# Patient Record
Sex: Male | Born: 1935
Health system: Southern US, Community
[De-identification: ages and names within clinical notes are randomized; demographics above are authoritative.]

## PROBLEM LIST (undated history)

## (undated) DIAGNOSIS — R609 Edema, unspecified: Secondary | ICD-10-CM

## (undated) DIAGNOSIS — R51 Headache: Secondary | ICD-10-CM

## (undated) DIAGNOSIS — IMO0002 Reserved for concepts with insufficient information to code with codable children: Secondary | ICD-10-CM

## (undated) DIAGNOSIS — J449 Chronic obstructive pulmonary disease, unspecified: Secondary | ICD-10-CM

## (undated) DIAGNOSIS — M810 Age-related osteoporosis without current pathological fracture: Secondary | ICD-10-CM

## (undated) DIAGNOSIS — N400 Enlarged prostate without lower urinary tract symptoms: Secondary | ICD-10-CM

## (undated) DIAGNOSIS — R600 Localized edema: Secondary | ICD-10-CM

## (undated) DIAGNOSIS — K220 Achalasia of cardia: Secondary | ICD-10-CM

## (undated) DIAGNOSIS — N189 Chronic kidney disease, unspecified: Secondary | ICD-10-CM

## (undated) DIAGNOSIS — K224 Dyskinesia of esophagus: Secondary | ICD-10-CM

## (undated) DIAGNOSIS — I10 Essential (primary) hypertension: Secondary | ICD-10-CM

## (undated) DIAGNOSIS — I4891 Unspecified atrial fibrillation: Secondary | ICD-10-CM

## (undated) DIAGNOSIS — Z8719 Personal history of other diseases of the digestive system: Secondary | ICD-10-CM

## (undated) DIAGNOSIS — F1010 Alcohol abuse, uncomplicated: Secondary | ICD-10-CM

## (undated) DIAGNOSIS — M1990 Unspecified osteoarthritis, unspecified site: Secondary | ICD-10-CM

## (undated) DIAGNOSIS — J45909 Unspecified asthma, uncomplicated: Secondary | ICD-10-CM

## (undated) DIAGNOSIS — K644 Residual hemorrhoidal skin tags: Secondary | ICD-10-CM

## (undated) DIAGNOSIS — K219 Gastro-esophageal reflux disease without esophagitis: Secondary | ICD-10-CM

## (undated) DIAGNOSIS — I878 Other specified disorders of veins: Secondary | ICD-10-CM

## (undated) DIAGNOSIS — E538 Deficiency of other specified B group vitamins: Secondary | ICD-10-CM

## (undated) DIAGNOSIS — IMO0001 Reserved for inherently not codable concepts without codable children: Secondary | ICD-10-CM

## (undated) DIAGNOSIS — D090 Carcinoma in situ of bladder: Secondary | ICD-10-CM

## (undated) HISTORY — DX: Essential (primary) hypertension: I10

## (undated) HISTORY — DX: Edema, unspecified: R60.9

## (undated) HISTORY — DX: Alcohol abuse, uncomplicated: F10.10

## (undated) HISTORY — DX: Carcinoma in situ of bladder: D09.0

## (undated) HISTORY — DX: Unspecified atrial fibrillation: I48.91

## (undated) HISTORY — DX: Unspecified osteoarthritis, unspecified site: M19.90

## (undated) HISTORY — DX: Deficiency of other specified B group vitamins: E53.8

## (undated) HISTORY — DX: Unspecified asthma, uncomplicated: J45.909

## (undated) HISTORY — DX: Reserved for concepts with insufficient information to code with codable children: IMO0002

## (undated) HISTORY — DX: Dyskinesia of esophagus: K22.4

## (undated) HISTORY — DX: Other specified disorders of veins: I87.8

## (undated) HISTORY — DX: Gastro-esophageal reflux disease without esophagitis: K21.9

## (undated) HISTORY — PX: LUNG BIOPSY: SHX232

## (undated) HISTORY — DX: Benign prostatic hyperplasia without lower urinary tract symptoms: N40.0

## (undated) HISTORY — PX: JOINT REPLACEMENT: SHX530

## (undated) HISTORY — DX: Localized edema: R60.0

## (undated) HISTORY — DX: Residual hemorrhoidal skin tags: K64.4

## (undated) HISTORY — DX: Age-related osteoporosis without current pathological fracture: M81.0

## (undated) HISTORY — PX: BLADDER ASPIRATION: SHX472

## (undated) HISTORY — DX: Reserved for inherently not codable concepts without codable children: IMO0001

## (undated) HISTORY — PX: HERNIA REPAIR: SHX51

## (undated) HISTORY — DX: Achalasia of cardia: K22.0

## (undated) HISTORY — DX: Chronic obstructive pulmonary disease, unspecified: J44.9

---

## 1972-06-27 HISTORY — PX: RHINOPLASTY: SUR1284

## 2000-06-27 DIAGNOSIS — K224 Dyskinesia of esophagus: Secondary | ICD-10-CM

## 2000-06-27 HISTORY — DX: Dyskinesia of esophagus: K22.4

## 2000-07-25 ENCOUNTER — Ambulatory Visit (HOSPITAL_COMMUNITY): Admission: RE | Admit: 2000-07-25 | Discharge: 2000-07-25 | Payer: Self-pay | Admitting: Gastroenterology

## 2000-07-25 ENCOUNTER — Encounter: Payer: Self-pay | Admitting: Gastroenterology

## 2000-08-23 ENCOUNTER — Ambulatory Visit (HOSPITAL_COMMUNITY): Admission: RE | Admit: 2000-08-23 | Discharge: 2000-08-23 | Payer: Self-pay | Admitting: Gastroenterology

## 2000-08-23 ENCOUNTER — Encounter: Payer: Self-pay | Admitting: Gastroenterology

## 2000-09-25 ENCOUNTER — Ambulatory Visit (HOSPITAL_COMMUNITY): Admission: RE | Admit: 2000-09-25 | Discharge: 2000-09-25 | Payer: Self-pay | Admitting: Gastroenterology

## 2002-11-11 ENCOUNTER — Ambulatory Visit (HOSPITAL_COMMUNITY): Admission: RE | Admit: 2002-11-11 | Discharge: 2002-11-11 | Payer: Self-pay | Admitting: Family Medicine

## 2002-11-11 ENCOUNTER — Encounter: Payer: Self-pay | Admitting: Family Medicine

## 2002-11-18 ENCOUNTER — Ambulatory Visit (HOSPITAL_COMMUNITY): Admission: RE | Admit: 2002-11-18 | Discharge: 2002-11-18 | Payer: Self-pay | Admitting: Family Medicine

## 2002-12-13 ENCOUNTER — Ambulatory Visit (HOSPITAL_COMMUNITY): Admission: RE | Admit: 2002-12-13 | Discharge: 2002-12-13 | Payer: Self-pay | Admitting: Family Medicine

## 2002-12-13 ENCOUNTER — Encounter: Payer: Self-pay | Admitting: Family Medicine

## 2002-12-26 ENCOUNTER — Encounter (HOSPITAL_COMMUNITY): Admission: RE | Admit: 2002-12-26 | Discharge: 2003-01-25 | Payer: Self-pay | Admitting: Cardiology

## 2003-01-01 ENCOUNTER — Encounter: Payer: Self-pay | Admitting: Thoracic Surgery (Cardiothoracic Vascular Surgery)

## 2003-01-01 ENCOUNTER — Ambulatory Visit (HOSPITAL_COMMUNITY)
Admission: RE | Admit: 2003-01-01 | Discharge: 2003-01-01 | Payer: Self-pay | Admitting: Thoracic Surgery (Cardiothoracic Vascular Surgery)

## 2003-01-07 ENCOUNTER — Inpatient Hospital Stay (HOSPITAL_COMMUNITY)
Admission: RE | Admit: 2003-01-07 | Discharge: 2003-01-12 | Payer: Self-pay | Admitting: Thoracic Surgery (Cardiothoracic Vascular Surgery)

## 2003-01-07 ENCOUNTER — Encounter: Payer: Self-pay | Admitting: Thoracic Surgery (Cardiothoracic Vascular Surgery)

## 2003-01-07 ENCOUNTER — Encounter (INDEPENDENT_AMBULATORY_CARE_PROVIDER_SITE_OTHER): Payer: Self-pay | Admitting: Specialist

## 2003-01-08 ENCOUNTER — Encounter: Payer: Self-pay | Admitting: Thoracic Surgery (Cardiothoracic Vascular Surgery)

## 2003-01-10 ENCOUNTER — Encounter: Payer: Self-pay | Admitting: Thoracic Surgery (Cardiothoracic Vascular Surgery)

## 2003-01-11 ENCOUNTER — Encounter: Payer: Self-pay | Admitting: Thoracic Surgery (Cardiothoracic Vascular Surgery)

## 2003-01-28 ENCOUNTER — Encounter
Admission: RE | Admit: 2003-01-28 | Discharge: 2003-01-28 | Payer: Self-pay | Admitting: Thoracic Surgery (Cardiothoracic Vascular Surgery)

## 2003-01-28 ENCOUNTER — Encounter: Payer: Self-pay | Admitting: Thoracic Surgery (Cardiothoracic Vascular Surgery)

## 2003-07-04 ENCOUNTER — Ambulatory Visit (HOSPITAL_COMMUNITY): Admission: RE | Admit: 2003-07-04 | Discharge: 2003-07-04 | Payer: Self-pay | Admitting: Family Medicine

## 2004-04-08 ENCOUNTER — Ambulatory Visit (HOSPITAL_COMMUNITY): Admission: RE | Admit: 2004-04-08 | Discharge: 2004-04-08 | Payer: Self-pay | Admitting: Family Medicine

## 2004-08-06 ENCOUNTER — Ambulatory Visit (HOSPITAL_COMMUNITY): Admission: RE | Admit: 2004-08-06 | Discharge: 2004-08-06 | Payer: Self-pay | Admitting: Family Medicine

## 2004-08-13 ENCOUNTER — Ambulatory Visit (HOSPITAL_COMMUNITY): Admission: RE | Admit: 2004-08-13 | Discharge: 2004-08-13 | Payer: Self-pay | Admitting: Family Medicine

## 2004-08-21 ENCOUNTER — Encounter: Admission: RE | Admit: 2004-08-21 | Discharge: 2004-08-21 | Payer: Self-pay | Admitting: Neurosurgery

## 2004-09-29 ENCOUNTER — Ambulatory Visit: Payer: Self-pay | Admitting: Internal Medicine

## 2004-09-29 ENCOUNTER — Ambulatory Visit (HOSPITAL_COMMUNITY): Admission: RE | Admit: 2004-09-29 | Discharge: 2004-09-29 | Payer: Self-pay | Admitting: Internal Medicine

## 2005-03-11 ENCOUNTER — Ambulatory Visit (HOSPITAL_COMMUNITY): Admission: RE | Admit: 2005-03-11 | Discharge: 2005-03-11 | Payer: Self-pay | Admitting: Family Medicine

## 2005-08-10 ENCOUNTER — Encounter: Admission: RE | Admit: 2005-08-10 | Discharge: 2005-08-10 | Payer: Self-pay | Admitting: Orthopedic Surgery

## 2006-09-11 ENCOUNTER — Encounter: Admission: RE | Admit: 2006-09-11 | Discharge: 2006-09-11 | Payer: Self-pay | Admitting: Orthopedic Surgery

## 2007-01-04 ENCOUNTER — Ambulatory Visit (HOSPITAL_COMMUNITY): Admission: RE | Admit: 2007-01-04 | Discharge: 2007-01-04 | Payer: Self-pay | Admitting: Family Medicine

## 2007-01-16 ENCOUNTER — Ambulatory Visit (HOSPITAL_COMMUNITY): Admission: RE | Admit: 2007-01-16 | Discharge: 2007-01-16 | Payer: Self-pay | Admitting: Family Medicine

## 2007-01-18 ENCOUNTER — Ambulatory Visit (HOSPITAL_COMMUNITY): Admission: RE | Admit: 2007-01-18 | Discharge: 2007-01-18 | Payer: Self-pay | Admitting: Family Medicine

## 2007-01-19 ENCOUNTER — Ambulatory Visit: Payer: Self-pay | Admitting: Cardiology

## 2007-01-30 ENCOUNTER — Ambulatory Visit (HOSPITAL_COMMUNITY): Admission: RE | Admit: 2007-01-30 | Discharge: 2007-01-30 | Payer: Self-pay | Admitting: Family Medicine

## 2007-02-14 ENCOUNTER — Ambulatory Visit: Payer: Self-pay | Admitting: Cardiovascular Disease

## 2007-03-22 ENCOUNTER — Ambulatory Visit (HOSPITAL_COMMUNITY): Admission: RE | Admit: 2007-03-22 | Discharge: 2007-03-22 | Payer: Self-pay | Admitting: Cardiovascular Disease

## 2007-04-02 ENCOUNTER — Ambulatory Visit: Payer: Self-pay | Admitting: Cardiovascular Disease

## 2007-04-02 ENCOUNTER — Ambulatory Visit (HOSPITAL_COMMUNITY): Admission: RE | Admit: 2007-04-02 | Discharge: 2007-04-02 | Payer: Self-pay | Admitting: Cardiovascular Disease

## 2007-04-10 ENCOUNTER — Ambulatory Visit: Payer: Self-pay | Admitting: Cardiovascular Disease

## 2007-04-10 ENCOUNTER — Inpatient Hospital Stay (HOSPITAL_BASED_OUTPATIENT_CLINIC_OR_DEPARTMENT_OTHER): Admission: RE | Admit: 2007-04-10 | Discharge: 2007-04-10 | Payer: Self-pay | Admitting: Cardiovascular Disease

## 2008-04-07 ENCOUNTER — Ambulatory Visit (HOSPITAL_COMMUNITY): Admission: RE | Admit: 2008-04-07 | Discharge: 2008-04-07 | Payer: Self-pay | Admitting: Family Medicine

## 2008-04-18 ENCOUNTER — Ambulatory Visit (HOSPITAL_COMMUNITY): Admission: RE | Admit: 2008-04-18 | Discharge: 2008-04-18 | Payer: Self-pay | Admitting: Family Medicine

## 2008-04-25 ENCOUNTER — Ambulatory Visit (HOSPITAL_COMMUNITY): Admission: RE | Admit: 2008-04-25 | Discharge: 2008-04-25 | Payer: Self-pay | Admitting: Family Medicine

## 2008-05-01 ENCOUNTER — Ambulatory Visit (HOSPITAL_COMMUNITY): Admission: RE | Admit: 2008-05-01 | Discharge: 2008-05-01 | Payer: Self-pay | Admitting: Family Medicine

## 2008-05-15 ENCOUNTER — Ambulatory Visit (HOSPITAL_COMMUNITY): Admission: RE | Admit: 2008-05-15 | Discharge: 2008-05-15 | Payer: Self-pay | Admitting: Family Medicine

## 2008-10-15 ENCOUNTER — Ambulatory Visit (HOSPITAL_COMMUNITY): Admission: RE | Admit: 2008-10-15 | Discharge: 2008-10-15 | Payer: Self-pay | Admitting: Family Medicine

## 2008-11-09 ENCOUNTER — Emergency Department (HOSPITAL_COMMUNITY): Admission: EM | Admit: 2008-11-09 | Discharge: 2008-11-09 | Payer: Self-pay | Admitting: Emergency Medicine

## 2009-02-21 ENCOUNTER — Emergency Department (HOSPITAL_COMMUNITY): Admission: EM | Admit: 2009-02-21 | Discharge: 2009-02-21 | Payer: Self-pay | Admitting: Emergency Medicine

## 2009-02-25 ENCOUNTER — Inpatient Hospital Stay: Admission: AD | Admit: 2009-02-25 | Discharge: 2009-04-01 | Payer: Self-pay | Admitting: Internal Medicine

## 2009-02-25 DIAGNOSIS — E538 Deficiency of other specified B group vitamins: Secondary | ICD-10-CM

## 2009-02-25 HISTORY — DX: Deficiency of other specified B group vitamins: E53.8

## 2009-03-04 ENCOUNTER — Ambulatory Visit (HOSPITAL_COMMUNITY): Admission: RE | Admit: 2009-03-04 | Discharge: 2009-03-04 | Payer: Self-pay | Admitting: Internal Medicine

## 2009-03-08 ENCOUNTER — Ambulatory Visit (HOSPITAL_COMMUNITY): Admission: RE | Admit: 2009-03-08 | Discharge: 2009-03-08 | Payer: Self-pay | Admitting: Internal Medicine

## 2009-03-10 ENCOUNTER — Ambulatory Visit (HOSPITAL_COMMUNITY): Admission: RE | Admit: 2009-03-10 | Discharge: 2009-03-10 | Payer: Self-pay | Admitting: Internal Medicine

## 2009-03-11 ENCOUNTER — Encounter: Payer: Self-pay | Admitting: Orthopedic Surgery

## 2009-03-11 ENCOUNTER — Ambulatory Visit (HOSPITAL_COMMUNITY): Admission: RE | Admit: 2009-03-11 | Discharge: 2009-03-11 | Payer: Self-pay | Admitting: Internal Medicine

## 2009-03-19 ENCOUNTER — Ambulatory Visit: Payer: Self-pay | Admitting: Orthopedic Surgery

## 2009-03-19 DIAGNOSIS — Z8679 Personal history of other diseases of the circulatory system: Secondary | ICD-10-CM | POA: Insufficient documentation

## 2009-03-19 DIAGNOSIS — IMO0002 Reserved for concepts with insufficient information to code with codable children: Secondary | ICD-10-CM

## 2009-03-20 ENCOUNTER — Telehealth: Payer: Self-pay | Admitting: Orthopedic Surgery

## 2009-04-16 ENCOUNTER — Ambulatory Visit (HOSPITAL_COMMUNITY): Admission: RE | Admit: 2009-04-16 | Discharge: 2009-04-16 | Payer: Self-pay | Admitting: Family Medicine

## 2009-04-30 ENCOUNTER — Telehealth: Payer: Self-pay | Admitting: Orthopedic Surgery

## 2009-05-13 DIAGNOSIS — K644 Residual hemorrhoidal skin tags: Secondary | ICD-10-CM | POA: Insufficient documentation

## 2009-05-13 DIAGNOSIS — J449 Chronic obstructive pulmonary disease, unspecified: Secondary | ICD-10-CM

## 2009-05-13 DIAGNOSIS — R1310 Dysphagia, unspecified: Secondary | ICD-10-CM | POA: Insufficient documentation

## 2009-05-15 ENCOUNTER — Ambulatory Visit: Payer: Self-pay | Admitting: Internal Medicine

## 2009-05-15 DIAGNOSIS — K224 Dyskinesia of esophagus: Secondary | ICD-10-CM | POA: Insufficient documentation

## 2009-05-15 DIAGNOSIS — K5900 Constipation, unspecified: Secondary | ICD-10-CM | POA: Insufficient documentation

## 2009-05-15 DIAGNOSIS — K219 Gastro-esophageal reflux disease without esophagitis: Secondary | ICD-10-CM

## 2009-05-15 DIAGNOSIS — F1010 Alcohol abuse, uncomplicated: Secondary | ICD-10-CM | POA: Insufficient documentation

## 2009-05-15 DIAGNOSIS — R634 Abnormal weight loss: Secondary | ICD-10-CM

## 2009-05-26 ENCOUNTER — Telehealth (INDEPENDENT_AMBULATORY_CARE_PROVIDER_SITE_OTHER): Payer: Self-pay

## 2009-05-27 ENCOUNTER — Ambulatory Visit: Payer: Self-pay | Admitting: Internal Medicine

## 2009-05-27 ENCOUNTER — Ambulatory Visit (HOSPITAL_COMMUNITY): Admission: RE | Admit: 2009-05-27 | Discharge: 2009-05-27 | Payer: Self-pay | Admitting: Internal Medicine

## 2009-06-04 ENCOUNTER — Encounter: Payer: Self-pay | Admitting: Internal Medicine

## 2009-12-17 ENCOUNTER — Inpatient Hospital Stay (HOSPITAL_COMMUNITY): Admission: EM | Admit: 2009-12-17 | Discharge: 2009-12-22 | Payer: Self-pay | Admitting: Emergency Medicine

## 2009-12-23 ENCOUNTER — Emergency Department (HOSPITAL_COMMUNITY): Admission: EM | Admit: 2009-12-23 | Discharge: 2009-12-23 | Payer: Self-pay | Admitting: Emergency Medicine

## 2010-01-27 ENCOUNTER — Telehealth (INDEPENDENT_AMBULATORY_CARE_PROVIDER_SITE_OTHER): Payer: Self-pay | Admitting: *Deleted

## 2010-02-03 ENCOUNTER — Encounter: Payer: Self-pay | Admitting: Internal Medicine

## 2010-02-11 ENCOUNTER — Ambulatory Visit (HOSPITAL_COMMUNITY)
Admission: RE | Admit: 2010-02-11 | Discharge: 2010-02-11 | Payer: Self-pay | Source: Home / Self Care | Admitting: Internal Medicine

## 2010-02-11 ENCOUNTER — Ambulatory Visit: Payer: Self-pay | Admitting: Internal Medicine

## 2010-02-11 HISTORY — PX: ESOPHAGOGASTRODUODENOSCOPY: SHX1529

## 2010-07-08 ENCOUNTER — Ambulatory Visit: Admit: 2010-07-08 | Payer: Self-pay | Admitting: Orthopedic Surgery

## 2010-07-08 ENCOUNTER — Ambulatory Visit
Admission: RE | Admit: 2010-07-08 | Discharge: 2010-07-08 | Payer: Self-pay | Source: Home / Self Care | Attending: Orthopedic Surgery | Admitting: Orthopedic Surgery

## 2010-07-08 ENCOUNTER — Encounter: Payer: Self-pay | Admitting: Orthopedic Surgery

## 2010-07-08 DIAGNOSIS — IMO0002 Reserved for concepts with insufficient information to code with codable children: Secondary | ICD-10-CM | POA: Insufficient documentation

## 2010-07-08 DIAGNOSIS — S72009A Fracture of unspecified part of neck of unspecified femur, initial encounter for closed fracture: Secondary | ICD-10-CM | POA: Insufficient documentation

## 2010-07-12 ENCOUNTER — Encounter: Payer: Self-pay | Admitting: Orthopedic Surgery

## 2010-07-13 ENCOUNTER — Encounter: Payer: Self-pay | Admitting: Orthopedic Surgery

## 2010-07-17 ENCOUNTER — Encounter: Payer: Self-pay | Admitting: Family Medicine

## 2010-07-19 ENCOUNTER — Encounter: Payer: Self-pay | Admitting: Orthopedic Surgery

## 2010-07-21 LAB — URINALYSIS, DIPSTICK ONLY
Bilirubin Urine: NEGATIVE
Ketones, ur: NEGATIVE mg/dL
Nitrite: NEGATIVE
Specific Gravity, Urine: >1.03 — ABNORMAL HIGH (ref 1.005–1.030)
Urine Glucose, Fasting: NEGATIVE mg/dL
pH: 6 (ref 5.0–8.0)

## 2010-07-21 LAB — SURGICAL PCR SCREEN: MRSA, PCR: NEGATIVE

## 2010-07-21 LAB — CBC
HCT: 36.8 % — ABNORMAL LOW (ref 39.0–52.0)
MCH: 30.9 pg (ref 26.0–34.0)
MCHC: 34 g/dL (ref 30.0–36.0)
RBC: 4.05 MIL/uL — ABNORMAL LOW (ref 4.22–5.81)
RDW: 14.9 % (ref 11.5–15.5)
WBC: 4.7 10*3/uL (ref 4.0–10.5)

## 2010-07-21 LAB — BASIC METABOLIC PANEL
BUN: 19 mg/dL (ref 6–23)
CO2: 28 mEq/L (ref 19–32)
Chloride: 103 mEq/L (ref 96–112)
Creatinine, Ser: 0.74 mg/dL (ref 0.4–1.5)
Glucose, Bld: 81 mg/dL (ref 70–99)
Potassium: 4.1 mEq/L (ref 3.5–5.1)

## 2010-07-22 ENCOUNTER — Telehealth: Payer: Self-pay | Admitting: Orthopedic Surgery

## 2010-07-23 LAB — URINE CULTURE: Colony Count: 25000

## 2010-07-26 ENCOUNTER — Ambulatory Visit (HOSPITAL_COMMUNITY)
Admission: RE | Admit: 2010-07-26 | Discharge: 2010-07-26 | Disposition: A | Discharge status: Home / Self Care | Payer: Self-pay | Source: Home / Self Care | Attending: Orthopedic Surgery | Admitting: Orthopedic Surgery

## 2010-07-27 NOTE — Progress Notes (Signed)
Summary: need to set up EGD w/ [facility]  Phone Note From Other Clinic   Summary of Call: Christin Fudge speech pathologist from [facility] called and needs another EGD scheduled for [name] [name]. DX: getting choked while eating. call Andrey Campanile to schedule at [phone]  and have front desk have her paged. Initial call taken by: Rexene Alberts,  January 27, 2010 10:44 AM     Appended Document: need to set up EGD w/ [facility] EGD scheduled for 02/11/10 -

## 2010-07-27 NOTE — Letter (Signed)
Summary: EGD order   EGD order   Imported By: Peggyann Shoals 02/03/2010 11:02:13  _____________________________________________________________________  External Attachment:    Type:   Image     Comment:   External Document

## 2010-07-29 NOTE — Letter (Signed)
Summary: History form  History form   Imported By: Jacklynn Ganong 07/20/2010 14:14:38  _____________________________________________________________________  External Attachment:    Type:   Image     Comment:   External Document

## 2010-07-29 NOTE — Progress Notes (Signed)
Summary: pt surgery has been cancelled  Phone Note Outgoing Call Call back at [facility] Phone [phone]   Call placed by: Ether Griffins,  July 22, 2010 1:41 PM Call placed to: wife Summary of Call: called to let pt now we have to cx surgery for Monday the 30th due to patient having UTI being on ATBS, advised to call us back when he finished ATBS to come in the office to reschedule surgery  Already let Romeo Apple from [facility] know, called [facility] cx therapy, called kim and cancelled surgery also. Initial call taken by: Ether Griffins,  July 22, 2010 1:44 PM  Follow-up for Phone Call        thank you. Follow-up by: Cammie Sickle,  July 23, 2010 9:02 AM

## 2010-07-29 NOTE — Letter (Signed)
Summary: *Orthopedic Consult Note  [facility]  [address]  [place], [place] [address]   Phone: [phone]  Fax: [phone]    Re:    [name] [name] Matthew DOB:    03-26-1933   Dear: DR Hilda Lias    Thank you for requesting that we see the above patient for consultation.  A copy of the detailed office note will be sent under separate cover, for your review.  Evaluation today is consistent with: NON UNION AND FRACTURE RIGHT HIP POSS AVN    Our recommendation is for: HARDWARE REMOVAL AND RIGHT TOTAL HIP ARTHROPLASTY        Thank you for this opportunity to look after your patient.  Sincerely,   Terrance Mass. MD.

## 2010-07-29 NOTE — Miscellaneous (Signed)
Summary: faxed surg info to [facility]  Clinical Lists Changes

## 2010-07-29 NOTE — Letter (Signed)
Summary: Referral notes from Dr. Hilda Lias  Referral notes from Dr. Hilda Lias   Imported By: Jacklynn Ganong 07/19/2010 17:02:20  _____________________________________________________________________  External Attachment:    Type:   Image     Comment:   External Document

## 2010-07-29 NOTE — Assessment & Plan Note (Signed)
Summary: NEW PROB/RT HIP EVAL FOR THA/REF W.KEELING/MEDICARE/MUT OF OM...   Visit Type:  new problem Primary Provider:  Lubertha South, MD  CC:  right hip pain.  History of Present Illness:   77 year old male who underwent open treatment internal fixation of a femoral neck fracture in June of 2011 presents as a roof from the operating physician Dr. Teola Bradley for nonunion and avascular necrosis of the RIGHT hip and failure of fixation.  The patient complains of sharp intermittent pain associated with ambulation.  The patient primarily spends his time in a wheelchair and only walks to the restroom with his walker.  He did well until Thanksgiving of last year.  Patient lives at home with his wife and she is having difficulty managing him at this time.  He does get some relief from the pain medication that he is taking which is gabapentin 300 mg.  He has a history of a rhinoplasty, herniorrhaphy, lung biopsy.  His primary care physician is Dr. Lubertha South.  Xrays from Dr. Sanjuan Dame office from 11/11.  Medications: Verapamil 240 mg, Folic acid, B12 1000 mg, Calcium, Prilosec, Citalpram 20 mg, Probiotic, Zolpidem 5 mg, Gabapentin 300 mg, Promethazine as needed, Aleve.  Allergies (verified): 1)  ! Sulfa 2)  ! Percocet 3)  ! * Tramadol  Review of Systems Constitutional:  Complains of weight loss; denies weight gain, fever, chills, and fatigue. Respiratory:  Complains of couch; denies short of breath, wheezing, tightness, pain on inspiration, and snoring . Gastrointestinal:  Complains of constipation; denies heartburn, nausea, vomiting, diarrhea, and blood in your stools. Genitourinary:  Complains of painful urination; denies frequency, urgency, difficulty urinating, flank pain, and bleeding in urine. Musculoskeletal:  Complains of joint pain and stiffness; denies swelling, instability, redness, heat, and muscle pain. Endocrine:  Complains of exessive urination; denies excessive thirst  and heat or cold intolerance. Skin:  Complains of itching and redness; denies changes in the skin, poor healing, and rash.  The review of systems is negative for Cardiovascular, Neurologic, Psychiatric, HEENT, Immunology, and Hemoatologic.  Physical Exam  Msk:  as far as his lower extremities go he has 3 cm of shortening on the RIGHT with an external rotation contracture and only minimal internal rotation to neutral.  He also has a LEFT knee flexion contracture in LEFT hip range of motion of internal rotation 0-5 and external rotation of 30.  The RIGHT hip flexes approximately 100.  His muscle tone is normal in both lower extremities.   Pulses:  pulses normal in all 4 extremities Extremities:   The upper extremities have normal appearance, ROM, strength and stability.   Neurologic:  no focal deficits, CN II-XII grossly intact with normal reflexes, coordination, muscle strength and tone Skin:  intact without lesions or rashes, RIGHT hip incision has healed Cervical Nodes:  no significant adenopathy Inguinal Nodes:  no significant adenopathy Psych:  alert and cooperative; normal mood and affect; normal attention span and concentration   Impression & Recommendations:  Problem # 1:  NONUNION OF FRACTURE (ICD-733.82)  the last film I have is from November of 2011 there are 3 Asnis screws in the RIGHT hip there appears to be varus deformity with shortening and no healing of the fracture with possible avascular necrosis of the superior fragment  Obviously this is a very difficult case and overlaid my concerns to the patient which include but are not limited to possibility of leg length discrepancy, weakness, no improvement in his walking, continued pain.  We  also discussed the normal problems that may occur after hip replacement including but not limited to dislocation, bleeding, infection, two-stage revision, blood clot, fatal pulmonary embolus.  They are anxious to proceed with  surgery.  Issue #1 The patient has a cough which has gotten worse since he was cleared for surgery by his primary care physician.  Issue # 2 He also is concerned about his rehabilitation options and does not want to go to a [place] facility.  Orders: New Patient Level V (09811)  Problem # 2:  HIP FRACTURE, RIGHT (ICD-820.8)  PLAN REMOVE HARDWARE   RIGHT TOTAL HIP (SPECIAL IMPLANTS)   Orders: New Patient Level V (91478)  Patient Instructions: 1)  DOS 07/26/10 [facility] 2)  Preop at [facility] on Wed.07/21/10, take packet with you to [facility] 3)  Post op 1 in our office on 08/09/10   Orders Added: 1)  New Patient Level V [29562]

## 2010-07-30 NOTE — Letter (Signed)
Summary: surgery order RT hip sched 07/26/10  surgery order RT hip sched 07/26/10   Imported By: Cammie Sickle 07/12/2010 12:48:52  _____________________________________________________________________  External Attachment:    Type:   Image     Comment:   External Document

## 2010-08-05 ENCOUNTER — Ambulatory Visit: Payer: Self-pay | Admitting: Orthopedic Surgery

## 2010-08-09 ENCOUNTER — Ambulatory Visit: Payer: Self-pay | Admitting: Orthopedic Surgery

## 2010-08-11 ENCOUNTER — Encounter: Payer: Self-pay | Admitting: Orthopedic Surgery

## 2010-08-11 ENCOUNTER — Ambulatory Visit (INDEPENDENT_AMBULATORY_CARE_PROVIDER_SITE_OTHER): Payer: Medicare Other | Admitting: Orthopedic Surgery

## 2010-08-11 DIAGNOSIS — S72009A Fracture of unspecified part of neck of unspecified femur, initial encounter for closed fracture: Secondary | ICD-10-CM

## 2010-08-11 DIAGNOSIS — IMO0002 Reserved for concepts with insufficient information to code with codable children: Secondary | ICD-10-CM

## 2010-08-17 ENCOUNTER — Telehealth: Payer: Self-pay | Admitting: Orthopedic Surgery

## 2010-08-17 ENCOUNTER — Encounter: Payer: Self-pay | Admitting: Orthopedic Surgery

## 2010-08-18 NOTE — Assessment & Plan Note (Signed)
Summary: TO [facility] SURGERY/MCR/MUT OF [place]/BSF   Visit Type:  Follow-up Primary Provider:  Lubertha South, MD  CC:  schedule hip surgery.  History of Present Illness:   77 year old male who underwent open treatment internal fixation of a femoral neck fracture in June of 2011 presents as a roof from the operating physician Dr. Teola Bradley for nonunion and avascular necrosis of the RIGHT hip and failure of fixation.  The patient complains of sharp intermittent pain associated with ambulation.  The patient primarily spends his time in a wheelchair and only walks to the restroom with his walker.  He did well until Thanksgiving of last year.  Patient lives at home with his wife and she is having difficulty managing him at this time.  He does get some relief from the pain medication that he is taking which is gabapentin 300 mg.  He has a history of a rhinoplasty, herniorrhaphy, lung biopsy.  His primary care physician is Dr. Lubertha South.  Xrays from Dr. Sanjuan Dame office from 11/11.  Medications: Verapamil 240 mg, Folic acid, B12 1000 mg, Calcium, Prilosec, Citalpram 20 mg, Probiotic, Zolpidem 5 mg, Gabapentin 300 mg, Promethazine as needed, Aleve.  Today to reshedule hip surgery, had to cx surgery date before due to UTI.  Is off of the ATBS now, finished Monday 08/09/10. Has been itching from waist down since he started antibiotics.  Pain comes and goes sometimes, has pain in right hip to ankle, some back pain.  In wheelchair today, walks to the bathroom and back.    Allergies: 1)  ! Sulfa 2)  ! Percocet 3)  ! * Tramadol  Physical Exam  Additional Exam:  Normal development normal grooming normal hygiene.  Patient awake and alert mood normal  Cardiovascular function normal in both lower extremities and both upper extremities pulses intact good color capillary refill no redness or tenderness  Skin in all 4 extremities normal skin along the lower lumbar thoracic and cervical  spine normal  Axillary lymph nodes negative cervical nodes negative  His upper extremity seems fine with no contracture subluxation atrophy or tremor and grade 5 motor strength  RIGHT hip flexion normal RIGHT knee extension normal RIGHT knee flexion normal RIGHT ankle dorsiflexion plantarflexion normal muscle tone normal.  LEFT ankle dorsiflexion plantar flexion normal LEFT knee extension flexion normal LEFT hip flexion abnormal with weakness even against gravity but I would graded as 3.  Lumbar spine was nontender cervical spine nontender thoracic spine nontender.     Impression & Recommendations:  Problem # 1:  NONUNION OF FRACTURE (ICD-733.82)  Orders: Est. Patient Level II (16109)  Problem # 2:  HIP FRACTURE, RIGHT (ICD-820.8)  REMOVE HARDWARE RIGHT HIP/ RIGHT TOTAL HIP REPLACEMENT   Orders: Est. Patient Level II (60454)  Patient Instructions: 1)  Go by Dr Cathlyn Parsons today to get urine culture at 1040 am 2)  Get Dr. Gerda Diss to give our office results so we can proceed with surgery. 3)  DOS 08/30/10 4)  Post op 09/13/10   Orders Added: 1)  Est. Patient Level II [09811]

## 2010-08-23 ENCOUNTER — Encounter (INDEPENDENT_AMBULATORY_CARE_PROVIDER_SITE_OTHER): Payer: Self-pay | Admitting: *Deleted

## 2010-08-24 NOTE — Progress Notes (Signed)
Summary: call from patient about results  Phone Note Call from Patient   Caller: Spouse Summary of Call: Patient/spouse called in regard to tests ordered by Dr.Steve Luking re: UTI.  Patient's wife states she has just received a call from [facility], nurse at Dr.Luking's office - "results are okay".  We have not received any reports or results, or call from Dr. Gerda Diss as of yet.   Patient's ph# is [phone]. (Surgery is scheduled for 08/30/10). Initial call taken by: Cammie Sickle,  August 17, 2010 11:19 AM

## 2010-08-24 NOTE — Letter (Signed)
Summary: surgery order RT hip sched 08/30/10  surgery order RT hip sched 08/30/10   Imported By: Cammie Sickle 08/16/2010 17:12:56  _____________________________________________________________________  External Attachment:    Type:   Image     Comment:   External Document

## 2010-08-24 NOTE — Miscellaneous (Signed)
Summary: uti results ok   Patient/spouse called in regard to tests ordered by Dr.Steve Luking re: UTI.  Patient's wife states she has just received a call from [facility], nurse at Dr.Luking's office - "results are okay".  We have Clinical Lists Changes

## 2010-08-25 ENCOUNTER — Telehealth: Payer: Self-pay | Admitting: Orthopedic Surgery

## 2010-08-25 ENCOUNTER — Encounter: Payer: Self-pay | Admitting: Orthopedic Surgery

## 2010-08-25 ENCOUNTER — Encounter (INDEPENDENT_AMBULATORY_CARE_PROVIDER_SITE_OTHER): Payer: Self-pay | Admitting: *Deleted

## 2010-08-26 ENCOUNTER — Other Ambulatory Visit: Payer: Self-pay | Admitting: Orthopedic Surgery

## 2010-08-26 ENCOUNTER — Encounter (HOSPITAL_COMMUNITY): Payer: Medicare Other

## 2010-08-26 LAB — URINALYSIS, ROUTINE W REFLEX MICROSCOPIC
Bilirubin Urine: NEGATIVE
Nitrite: NEGATIVE
Specific Gravity, Urine: 1.015 (ref 1.005–1.030)
Urobilinogen, UA: 0.2 mg/dL (ref 0.0–1.0)
pH: 6 (ref 5.0–8.0)

## 2010-08-26 LAB — BASIC METABOLIC PANEL
BUN: 19 mg/dL (ref 6–23)
Calcium: 9.3 mg/dL (ref 8.4–10.5)
Creatinine, Ser: 0.7 mg/dL (ref 0.4–1.5)
GFR calc non Af Amer: >60 mL/min (ref >60)
Glucose, Bld: 86 mg/dL (ref 70–99)

## 2010-08-26 LAB — PROTIME-INR
INR: 1.08 (ref 0.00–1.49)
Prothrombin Time: 14.2 seconds (ref 11.6–15.2)

## 2010-08-26 LAB — HEMOGLOBIN AND HEMATOCRIT, BLOOD
HCT: 38.7 % — ABNORMAL LOW (ref 39.0–52.0)
Hemoglobin: 12.8 g/dL — ABNORMAL LOW (ref 13.0–17.0)

## 2010-08-26 LAB — SURGICAL PCR SCREEN: Staphylococcus aureus: NEGATIVE

## 2010-08-26 LAB — APTT: aPTT: 30 seconds (ref 24–37)

## 2010-08-27 LAB — URINE CULTURE

## 2010-08-30 ENCOUNTER — Ambulatory Visit (HOSPITAL_COMMUNITY): Payer: Medicare Other

## 2010-08-30 ENCOUNTER — Inpatient Hospital Stay (HOSPITAL_COMMUNITY)
Admission: RE | Admit: 2010-08-30 | Discharge: 2010-09-02 | DRG: 470 | Disposition: A | Discharge status: Skilled Nursing Facility | Payer: Medicare Other | Source: Ambulatory Visit | Attending: Orthopedic Surgery | Admitting: Orthopedic Surgery

## 2010-08-30 ENCOUNTER — Encounter: Payer: Self-pay | Admitting: Orthopedic Surgery

## 2010-08-30 DIAGNOSIS — I10 Essential (primary) hypertension: Secondary | ICD-10-CM | POA: Diagnosis present

## 2010-08-30 DIAGNOSIS — M1610 Unilateral primary osteoarthritis, unspecified hip: Secondary | ICD-10-CM

## 2010-08-30 DIAGNOSIS — M169 Osteoarthritis of hip, unspecified: Secondary | ICD-10-CM

## 2010-08-30 DIAGNOSIS — IMO0002 Reserved for concepts with insufficient information to code with codable children: Secondary | ICD-10-CM

## 2010-08-30 DIAGNOSIS — S72009S Fracture of unspecified part of neck of unspecified femur, sequela: Secondary | ICD-10-CM

## 2010-08-30 DIAGNOSIS — D62 Acute posthemorrhagic anemia: Secondary | ICD-10-CM | POA: Diagnosis not present

## 2010-08-30 DIAGNOSIS — M87059 Idiopathic aseptic necrosis of unspecified femur: Principal | ICD-10-CM | POA: Diagnosis present

## 2010-08-30 DIAGNOSIS — F1011 Alcohol abuse, in remission: Secondary | ICD-10-CM | POA: Diagnosis present

## 2010-08-30 DIAGNOSIS — Z01812 Encounter for preprocedural laboratory examination: Secondary | ICD-10-CM

## 2010-08-30 LAB — HEMOGLOBIN AND HEMATOCRIT, BLOOD
HCT: 32.4 % — ABNORMAL LOW (ref 39.0–52.0)
Hemoglobin: 10.7 g/dL — ABNORMAL LOW (ref 13.0–17.0)

## 2010-08-30 LAB — MRSA PCR SCREENING: MRSA by PCR: NEGATIVE

## 2010-08-31 LAB — DIFFERENTIAL
Basophils Absolute: 0 10*3/uL (ref 0.0–0.1)
Basophils Relative: 1 % (ref 0–1)
Eosinophils Absolute: 0.2 10*3/uL (ref 0.0–0.7)
Eosinophils Relative: 5 % (ref 0–5)
Lymphs Abs: 0.7 10*3/uL (ref 0.7–4.0)
Neutrophils Relative %: 70 % (ref 43–77)

## 2010-08-31 LAB — BASIC METABOLIC PANEL
BUN: 20 mg/dL (ref 6–23)
GFR calc Af Amer: >60 mL/min (ref >60)
GFR calc non Af Amer: >60 mL/min (ref >60)
Potassium: 5.3 mEq/L — ABNORMAL HIGH (ref 3.5–5.1)

## 2010-08-31 LAB — CBC
MCV: 91.6 fL (ref 78.0–100.0)
Platelets: 181 10*3/uL (ref 150–400)
RDW: 15.4 % (ref 11.5–15.5)
WBC: 4.7 10*3/uL (ref 4.0–10.5)

## 2010-08-31 NOTE — Op Note (Addendum)
NAME:  [name] [name], [name] [name]                    ACCOUNT NO.:  [id number]  MEDICAL RECORD NO.:  [id number]           PATIENT TYPE:  I  LOCATION:  IC06                          FACILITY:  [facility]  PHYSICIAN:  Vickki Hearing, M.D.DATE OF BIRTH:  September 18, 1932  DATE OF PROCEDURE:  08/30/2010 DATE OF DISCHARGE:                              OPERATIVE REPORT   HISTORY:  A 77 year old male, fractured his right hip in June 2011, underwent open treatment and internal fixation with cannulated screws. The postop period, developed varus collapse and nonunion of the fracture and most likely avascular necrosis, presented back to his treating physician who recommended that the screws be removed and that he have a total hip replacement.  Referring physician to me was Dr. Teola Bradley, original position.  We discussed the problem with [name] [name] and his wife and agreed that he should have total hip replacement.  He agreed, accepting the risks and benefits of the surgery including nerve injury, vascular injury, leg length discrepancy, dislocation, DVT, pulmonary embolus, anesthetic death.  The patient had a urinary tract infection when we scheduled him the first time he had to be cancelled.  He was sent back to his medical physician who treated him with antibiotics.  We got a sterile urine culture and proceeded with scheduling him again for surgery.  PREOPERATIVE DIAGNOSES:  Nonunion, varus collapse, and avascular necrosis of the right hip, status post femoral neck fracture with osteoarthritis of the right hip.  POSTOPERATIVE DIAGNOSES:  Nonunion, varus collapse, and avascular necrosis of the right hip, status post femoral neck fracture with osteoarthritis of the right hip.  PROCEDURE:  Removal of hardware, right total hip arthroplasty.  SURGEON:  Vickki Hearing, MD.  ASSISTED BY:  [facility].  ANESTHETIC:  Spinal.  OPERATIVE FINDINGS:  Varus collapse, nonunion, avascular  necrosis of the proximal femoral neck fracture, grade 4 acetabular cartilage erosion, leg length discrepancy of approximately 3 cm.  IMPLANTS USED:  We used a DePuy hip system.  We used a size 58 Pinnacle Gription acetabular shell with a hole eliminator.  We used an AML femoral stem, size 12, small.  We used a Marathon polyethylene +4 neutral 36/58 liner and we used a 36/+5 metal head.  DETAILS OF PROCEDURE:  The right leg was marked as the surgical site by the patient, countersigned by the surgeon.  The chart was updated.  The patient was taken to surgery where he had spinal anesthetic.  He was given a gram of Ancef and he was placed in the lateral decubitus position with an axillary roll and padding in the appropriate places of his legs and arms.  We placed him in a Stulberg Hip Positioner with the right side up.  Foley catheter was inserted.  Leg was prepped and draped sterilely.  At this point, the time-out was executed, all implants were in place, correct surgical site was marked, and chart information match the planned procedure of right total hip arthroplasty with hardware removal.  A slightly curved incision was made over the greater trochanter, curved proximally and extended down  the femoral shaft distally.  Subcutaneous tissue was divided.  We incorporated the previous incision from the previous surgery.  Once we divided the fascia, we opened up the vastus lateralis fascia and the screws were removed.  We continued dissection to remove or to isolate the abductors and vastus lateralis and subperiosteally dissected the anterior third of the gluteus musculature from the greater trochanter in continuity with the vastus lateralis.  We coagulated bleeders as they were encountered.  Blunt dissection was carried out beneath the gluteus medius until the gluteus minimus was found and this was retracted proximally as well.  A capsulectomy was performed and 2 Steinmann pins were placed  in the pelvis to retract the soft tissue sleeve anteriorly.  The capsule was redundant and hypertrophied and was removed down to the level of lesser trochanter.  The hip was dislocated anteriorly.  A provisional femoral neck cut was made and removing the head.  The acetabulum was inspected and found to be bare of bone.  The hip in the anterior dislocated position, a box osteotome was used to enter the greater trochanter followed by canal finder and serial reaming and broaching up to a size 12.  The hip was then placed in extension and acetabular retractors were placed and soft tissue was removed around the acetabulum including osteophytes until a good exposure was obtained.  The fovea was noted to have significant amount of scar tissue and fat, which was removed, and then a straight reamer size 44 was placed to ream to the inner table, but not through it.  Serial reaming was performed up to a size 57 and then trial 58 cup was placed followed by a trial polyethylene liner and then a zero and +5 head were placed and the hip was taken through a trial reduction.  The +5 head fit better, gave restoration of leg length, restore offset.  Shuck was tested, it was good, and knee flexion was 120 degrees, external rotation was 50 degrees, internal rotation was 60 degrees.  The hip was dislocated again.  The trial components were removed.  Bone graft from the reaming and what was left of the good bone in the femoral head were used to place behind the cup due to the soft bone behind or in the acetabulum after reaming.  The cup was placed in approximately 45 degrees of abduction and matching anatomic version.  No screws were needed.  The hole eliminator was then placed.  The Marathon polyethylene liner was placed.  Drill holes were placed in the trochanter.  #5 Tycron sutures were passed and then the size 12 stem was passed and a second trial reduction was performed, I was happy with it.  We  removed the +5 trial head and placed a 36 metal hip ball with a +5 length.  We reduced the hip, checked the range of motion, I was again satisfied with it.  We repaired the vastus lateralis abductor sleeve with #1 Bralon in interrupted fashion and the two #5 Tycron sutures.  We did this with the hip slightly internally rotated.  We then placed the leg in abduction and closed the fascia with two running #1 Bralon and one interrupted #1 Bralon suture.  We did irrigate the hip with significant amounts of saline prior to closure.  We placed Marcaine beneath the abductors and beneath the fascia.  We placed a subcu pain pump catheter and then closed with zero and #2 Monocryl suture, and staples were used to reapproximate the skin  edges. A radiograph was obtained.  The hip was in good abduction and stem was in good position with a good fit.  Postop plan is for full weightbearing.  We will use Lovenox for DVT prophylaxis along with foot pumps.     Vickki Hearing, M.D.     SEH/MEDQ  D:  08/30/2010  T:  08/31/2010  Job:  [id number]  Electronically Signed by Fuller Canada M.D. on 08/31/2010 11:41:26 AM Electronically Signed by Fuller Canada M.D. on 08/31/2010 11:55:56 AM Electronically Signed by Fuller Canada M.D. on 08/31/2010 12:10:23 PM Electronically Signed by Fuller Canada M.D. on 08/31/2010 12:26:01 PM Electronically Signed by Fuller Canada M.D. on 08/31/2010 12:42:42 PM Electronically Signed by Fuller Canada M.D. on 08/31/2010 01:00:52 PM Electronically Signed by Fuller Canada M.D. on 08/31/2010 01:20:27 PM Electronically Signed by Fuller Canada M.D. on 08/31/2010 01:42:00 PM Electronically Signed by Fuller Canada M.D. on 08/31/2010 02:01:56 PM Electronically Signed by Fuller Canada M.D. on 08/31/2010 02:22:13 PM Electronically Signed by Fuller Canada M.D. on 08/31/2010 02:44:41 PM Electronically Signed by Fuller Canada M.D. on 08/31/2010  03:08:13 PM Electronically Signed by Fuller Canada M.D. on 08/31/2010 03:33:02 PM Electronically Signed by Fuller Canada M.D. on 08/31/2010 04:07:47 PM Electronically Signed by Fuller Canada M.D. on 08/31/2010 04:39:08 PM Electronically Signed by Fuller Canada M.D. on 08/31/2010 05:10:56 PM Electronically Signed by Fuller Canada M.D. on 08/31/2010 05:10:56 PM Electronically Signed by Fuller Canada M.D. on 08/31/2010 05:40:03 PM Electronically Signed by Fuller Canada M.D. on 08/31/2010 06:29:06 PM Electronically Signed by Fuller Canada M.D. on 08/31/2010 07:43:20 PM

## 2010-09-01 LAB — CROSSMATCH
ABO/RH(D): A POS
Antibody Screen: NEGATIVE
Unit division: 0

## 2010-09-01 LAB — DIFFERENTIAL
Basophils Relative: 1 % (ref 0–1)
Eosinophils Absolute: 0.1 10*3/uL (ref 0.0–0.7)
Lymphs Abs: 0.7 10*3/uL (ref 0.7–4.0)
Neutro Abs: 5.6 10*3/uL (ref 1.7–7.7)
Neutrophils Relative %: 81 % — ABNORMAL HIGH (ref 43–77)

## 2010-09-01 LAB — BASIC METABOLIC PANEL
Chloride: 99 mEq/L (ref 96–112)
GFR calc Af Amer: >60 mL/min (ref >60)
Potassium: 4.4 mEq/L (ref 3.5–5.1)

## 2010-09-01 LAB — CBC
MCV: 90.3 fL (ref 78.0–100.0)
Platelets: 181 10*3/uL (ref 150–400)
RBC: 3.31 MIL/uL — ABNORMAL LOW (ref 4.22–5.81)
WBC: 6.9 10*3/uL (ref 4.0–10.5)

## 2010-09-02 LAB — CBC
MCH: 30.6 pg (ref 26.0–34.0)
Platelets: 157 10*3/uL (ref 150–400)
RBC: 2.94 MIL/uL — ABNORMAL LOW (ref 4.22–5.81)
WBC: 4.6 10*3/uL (ref 4.0–10.5)

## 2010-09-02 LAB — BASIC METABOLIC PANEL
BUN: 11 mg/dL (ref 6–23)
Calcium: 8.2 mg/dL — ABNORMAL LOW (ref 8.4–10.5)
Chloride: 102 mEq/L (ref 96–112)
GFR calc Af Amer: >60 mL/min (ref >60)
GFR calc non Af Amer: >60 mL/min (ref >60)
Glucose, Bld: 102 mg/dL — ABNORMAL HIGH (ref 70–99)
Sodium: 134 mEq/L — ABNORMAL LOW (ref 135–145)

## 2010-09-02 LAB — DIFFERENTIAL
Basophils Relative: 0 % (ref 0–1)
Eosinophils Absolute: 0.2 10*3/uL (ref 0.0–0.7)
Monocytes Relative: 6 % (ref 3–12)
Neutrophils Relative %: 77 % (ref 43–77)

## 2010-09-02 NOTE — Miscellaneous (Signed)
Summary: faxed notes to [facility] for after surg therapy  Clinical Lists Changes

## 2010-09-02 NOTE — Progress Notes (Signed)
Summary: call back from Dr Fletcher Anon office  Phone Note From Other Clinic   Caller: Dr Fletcher Anon office Summary of Call: Alcario Drought called back from Dr Fletcher Anon office in response to nurse's call re: report/results.  States that per patient's last office visit 08/11/10, patient was unable to give a urine sample, therefore, no results.  Office ph# [phone] if any questions. Initial call taken by: Cammie Sickle,  August 25, 2010 11:01 AM  Follow-up for Phone Call        call patient tell him if we dont get a urine sample there will be no surgery  Follow-up by: Fuller Canada MD,  August 25, 2010 11:19 AM  Additional Follow-up for Phone Call Additional follow up Details #1::        Phone call completed Additional Follow-up by: Cammie Sickle,  August 25, 2010 12:55 PM

## 2010-09-02 NOTE — Miscellaneous (Signed)
Summary: No pre-authorization required  Clinical Lists Changes  Per insurer guidelines, Medicare and [employer] do not require pre-authorization re: surgery, in-patient, removal of hardware w/ hip replacement, scheduled 08/30/10 at [facility].

## 2010-09-02 NOTE — Miscellaneous (Signed)
Summary: urine check cx   Clinical Lists Changes

## 2010-09-02 NOTE — Progress Notes (Signed)
Summary: call from patient asking about results, Dr Gerda Diss  Phone Note Call from Patient   Caller: Patient Summary of Call: Patient's spouse called for patient to ask if we've received all the results and information related to his recent UTI.  Patient's pre-op is sched'd 08/26/10 for surgery 08/30/10. I see there is a clinical update note dated 08/17/10.  Report?  Initial call taken by: Cammie Sickle,  August 25, 2010 9:07 AM  Follow-up for Phone Call        I dont see any results scanned so we have not received any results, they will culture his urine when he has preop Follow-up by: Ether Griffins,  August 25, 2010 9:15 AM  Additional Follow-up for Phone Call Additional follow up Details #1::        advised patient. Additional Follow-up by: Cammie Sickle,  August 25, 2010 9:58 AM

## 2010-09-03 NOTE — Discharge Summary (Signed)
NAME:  [name] [name]                    ACCOUNT NO.:  [id number]  MEDICAL RECORD NO.:  [id number]           PATIENT TYPE:  I  LOCATION:  A207                          FACILITY:  [facility]  PHYSICIAN:  [name] [name], RobertsDATE OF BIRTH:  Oct 23, 1932  DATE OF ADMISSION:  08/30/2010 DATE OF DISCHARGE:  LH                         DISCHARGE SUMMARY-REFERRING   ADMITTING DIAGNOSES:  Nonunion right hip fracture with avascular necrosis and osteoarthritis.  DISCHARGE DIAGNOSES:  Nonunion right hip fracture with avascular necrosis and osteoarthritis with acute blood loss anemia.  SECONDARY DIAGNOSES:  His comorbidities include hypertension, history of alcohol abuse, but currently not using alcohol.  The family physicians are Dr. Lorin [name] and [name] [name].  He had the following procedure on August 31, 2010.  He underwent removal of hardware from previously failed fixation of the femoral neck fracture and he had a right total hip arthroplasty. 1. We used a DePuy hip system with a 58 pinnacle Gription acetabular     shell with hole eliminator. 2. We used an AML femoral stem size 12 small. 3. We used a marathon polyethylene +4 neutral 36/58 liner and we used     a 36/+5 metal head.  OPERATIVE FINDINGS:  There was varus collapse nonunion and avascular necrosis of the proximal femoral neck fracture with grade 4 acetabular cartilage erosion and a leg length discrepancy of 3-cm.  The surgery was done with a spinal anesthetic.  OPERATING PHYSICIAN:  [name] Hearing, MD  BRIEF HISTORY:  The patient had a hip fracture in June 2011, underwent open treatment internal fixation with cannulated screws.  During the postop period, he developed varus collapse nonunion of the fracture and avascular necrosis.  This led to a leg length discrepancy.  He went to his original treating physician who advised him to have a hip replacement and he was referred to me for the same  The patient's hospital course as  follows.  After admission, he underwent uncomplicated right total hip arthroplasty.  He had a blood loss of 250 mL estimated.  He had 2 units of blood in the postop period with a discharge hemoglobin of 9.0.  He does tend to run a low blood pressure.  His medication verapamil was decreased to 180 mg and then as a final dose of 120 mg at discharge.  See medications listed below.  He has essentially been maximum assist bed to chair mainly secondary to deconditioning and minimal ambulation since his fracture fixation failed over the last 6-8 months.  His discharge medicines will be as follows: 1. Zolpidem 5 mg at bedtime. 2. Vitamin B12 1000 mcg daily. 3. Promethazine 12.5 mg every 6 h. as needed for nausea. 4. Omeprazole 10 mg daily. 5. Gabapentin 300 mg at bedtime. 6. Folic acid 1 mg daily. 7. Citalopram 20 mg daily. 8. Calcium citrate vitamin D one daily. 9. Verapamil SR 120 mg one daily. 10.Senna and docusate sodium 8.6 mg daily at bedtime as needed. 11.Promethazine 12.5 mg every 6 h. as needed for nausea. 12.Lovenox 40 mg subcutaneous q.24 h. 13.Vicodin 5 mg 1-2 tablets q.4 p.r.n. for pain. 14.Robaxin  500 mg q.6 h. p.r.n. for spasm. 15.Calcium carbonate 500 mg with 200 international units of vitamin D     1 tab daily.  The patient has allergies to SULFA, TRAMADOL and PERCOCET.  HIP INSTRUCTIONS:  [name] [name] out on Monday, September 13, 2010.  Followup visit should be for September 20, 2010.  He has anterior hip precautions.  He has full weightbearing with a walker as needed.  No active abduction of the right leg.  No flexion past 90 degrees. Abduction pillow for 6 weeks with a stop date of October 09, 2010. Abduction pillow should be used anytime the patient is lying in bed for a sleep.  Lovenox stop date September 30, 2010.  Questions [phone].  The patient is scheduled to go to [facility] for rehab.     [name] [name], M.D.     SEH/MEDQ  D:   09/02/2010  T:  09/02/2010  Job:  [id number]  Electronically Signed by [name] [name] M.D. on 09/03/2010 07:53:23 AM

## 2010-09-06 ENCOUNTER — Encounter: Payer: Self-pay | Admitting: Orthopedic Surgery

## 2010-09-07 NOTE — Letter (Signed)
Summary: *Consult Note  [facility]  [address] [place] [address]  [place], [place] [address]   Phone: [phone]  Fax: [phone]    Re:    Quintel M Creps DOB:    Feb 10, 1933   Dear:    Danae Chen you for requesting that we see the above patient for consultation.  A copy of the detailed office note will be sent under separate cover, for your review.  Evaluation today is consistent with:    Our recommendation is for:    New Orders include:    New Medications started today include:    After today's visit, the patients current medications include: 1)  VERAPAMIL HCL CR 240 MG CR-TABS (VERAPAMIL HCL) once daily 2)  HYDROCODONE-ACETAMINOPHEN 5-500 MG TABS (HYDROCODONE-ACETAMINOPHEN) as needed 3)  PRILOSEC 20 MG CPDR (OMEPRAZOLE) two times a day 4)  FOLIC ACID 1 MG TABS (FOLIC ACID) once daily 5)  ZOFRAN 4 MG TABS (ONDANSETRON HCL) as needed 6)  * CALCIUM once daily 7)  * B- 12 INJECTIONS q month   Thank you for this consultation.  If you have any further questions regarding the care of this patient, please do not hesitate to contact me @   Thank you for this opportunity to look after your patient.  Sincerely,   Fuller Canada MD

## 2010-09-07 NOTE — Miscellaneous (Signed)
Visit Type:  Follow-up Primary Provider:  Lubertha South, MD  CC:  schedule hip surgery. right hip pain   History of Present Illness:   77 year old male who underwent open treatment internal fixation of a femoral neck fracture in June of 2011 presents as a roof from the operating physician Dr. Teola Bradley for nonunion and avascular necrosis of the RIGHT hip and failure of fixation.  The patient complains of sharp intermittent pain associated with ambulation.  The patient primarily spends his time in a wheelchair and only walks to the restroom with his walker.  He did well until Thanksgiving of last year.  Patient lives at home with his wife and she is having difficulty managing him at this time.  He does get some relief from the pain medication that he is taking which is gabapentin 300 mg.  He has a history of a rhinoplasty, herniorrhaphy, lung biopsy.  His primary care physician is Dr. Lubertha South.  Xrays from Dr. Sanjuan Dame office from 11/11.  Medications: Verapamil 240 mg, Folic acid, B12 1000 mg, Calcium, Prilosec, Citalpram 20 mg, Probiotic, Zolpidem 5 mg, Gabapentin 300 mg, Promethazine as needed, Aleve.  Today to reshedule hip surgery, had to cx surgery date before due to UTI.  Is off of the ATBS now, finished Monday 08/09/10. Has been itching from waist down since he started antibiotics.  Pain comes and goes sometimes, has pain in right hip to ankle, some back pain.  In wheelchair today, walks to the bathroom and back.  Past History:  Past Medical History: High blood pressure Alcoholic  Past Surgical History: Lung surgery Nose surgery  Allergies (verified): 1)  ! Sulfa 2)  ! Percocet 3)  ! * Tramadol  Review of Systems Constitutional:  Complains of weight loss; denies weight gain, fever, chills, and fatigue. Respiratory:  Complains of couch; denies short of breath, wheezing, tightness, pain on inspiration, and snoring . Gastrointestinal:  Complains of  constipation; denies heartburn, nausea, vomiting, diarrhea, and blood in your stools. Genitourinary:  Complains of painful urination; denies frequency, urgency, difficulty urinating, flank pain, and bleeding in urine. Musculoskeletal:  Complains of joint pain and stiffness; denies swelling, instability, redness, heat, and muscle pain. Endocrine:  Complains of exessive urination; denies excessive thirst and heat or cold intolerance. Skin:  Complains of itching and redness; denies changes in the skin, poor healing, and rash.  The review of systems is negative for Cardiovascular, Neurologic, Psychiatric, HEENT, Immunology, and Hemoatologic.  Physical Exam  Msk:  as far as his lower extremities go he has 3 cm of shortening on the RIGHT with an external rotation contracture and only minimal internal rotation to neutral.  He also has a LEFT knee flexion contracture in LEFT hip range of motion of internal rotation 0-5 and external rotation of 30.  The RIGHT hip flexes approximately 100.  His muscle tone is normal in both lower extremities.   Pulses:  pulses normal in all 4 extremities Extremities:   The upper extremities have normal appearance, ROM, strength and stability.   Neurologic:  no focal deficits, CN II-XII grossly intact with normal reflexes, coordination, muscle strength and tone Skin:  intact without lesions or rashes, RIGHT hip incision has healed Cervical Nodes:  no significant adenopathy Inguinal Nodes:  no significant adenopathy Psych:  alert and cooperative; normal mood and affect; normal attention span and concentration  Physical Exam  Additional Exam:  Normal development normal grooming normal hygiene.  Patient awake and alert mood normal  Cardiovascular  function normal in both lower extremities and both upper extremities pulses intact good color capillary refill no redness or tenderness  Skin in all 4 extremities normal skin along the lower lumbar thoracic and  cervical spine normal  Axillary lymph nodes negative cervical nodes negative  His upper extremity seems fine with no contracture subluxation atrophy or tremor and grade 5 motor strength  RIGHT hip flexion normal RIGHT knee extension normal RIGHT knee flexion normal RIGHT ankle dorsiflexion plantarflexion normal muscle tone normal.  LEFT ankle dorsiflexion plantar flexion normal LEFT knee extension flexion normal LEFT hip flexion abnormal with weakness even against gravity but I would graded as 3.  Lumbar spine was nontender cervical spine nontender thoracic spine nontender.     Impression & Recommendations:  Problem # 1:  NONUNION OF FRACTURE (ICD-733.82)    Problem # 2:  HIP FRACTURE, RIGHT (ICD-820.8)  REMOVE HARDWARE RIGHT HIP/ RIGHT TOTAL HIP REPLACEMENT   Orders: Est. Patient Level II (16109)  Patient Instructions: 1)  Go by Dr Cathlyn Parsons today to get urine culture at 1040 am 2)  Get Dr. Gerda Diss to give our office results so we can proceed with surgery. 3)  DOS 08/30/10 4)  Post op 09/13/10

## 2010-09-10 ENCOUNTER — Encounter: Payer: Self-pay | Admitting: Orthopedic Surgery

## 2010-09-12 LAB — BASIC METABOLIC PANEL
BUN: 10 mg/dL (ref 6–23)
BUN: 11 mg/dL (ref 6–23)
CO2: 28 mEq/L (ref 19–32)
CO2: 29 mEq/L (ref 19–32)
CO2: 29 mEq/L (ref 19–32)
CO2: 29 mEq/L (ref 19–32)
Calcium: 8 mg/dL — ABNORMAL LOW (ref 8.4–10.5)
Calcium: 8.3 mg/dL — ABNORMAL LOW (ref 8.4–10.5)
Calcium: 8.3 mg/dL — ABNORMAL LOW (ref 8.4–10.5)
Calcium: 8.4 mg/dL (ref 8.4–10.5)
Chloride: 101 mEq/L (ref 96–112)
Chloride: 103 mEq/L (ref 96–112)
Chloride: 99 mEq/L (ref 96–112)
Creatinine, Ser: 0.54 mg/dL (ref 0.4–1.5)
Creatinine, Ser: 0.61 mg/dL (ref 0.4–1.5)
Creatinine, Ser: 0.68 mg/dL (ref 0.4–1.5)
Creatinine, Ser: 0.72 mg/dL (ref 0.4–1.5)
GFR calc Af Amer: >60 mL/min (ref >60)
GFR calc Af Amer: >60 mL/min (ref >60)
GFR calc Af Amer: >60 mL/min (ref >60)
GFR calc Af Amer: >60 mL/min (ref >60)
GFR calc non Af Amer: >60 mL/min (ref >60)
Glucose, Bld: 120 mg/dL — ABNORMAL HIGH (ref 70–99)
Sodium: 133 mEq/L — ABNORMAL LOW (ref 135–145)
Sodium: 133 mEq/L — ABNORMAL LOW (ref 135–145)

## 2010-09-12 LAB — DIFFERENTIAL
Basophils Absolute: 0.1 10*3/uL (ref 0.0–0.1)
Basophils Absolute: 0.1 10*3/uL (ref 0.0–0.1)
Basophils Absolute: 0.1 10*3/uL (ref 0.0–0.1)
Basophils Relative: 1 % (ref 0–1)
Basophils Relative: 1 % (ref 0–1)
Basophils Relative: 2 % — ABNORMAL HIGH (ref 0–1)
Eosinophils Absolute: 0.1 10*3/uL (ref 0.0–0.7)
Eosinophils Absolute: 0.2 10*3/uL (ref 0.0–0.7)
Eosinophils Absolute: 0.2 10*3/uL (ref 0.0–0.7)
Eosinophils Absolute: 0.2 10*3/uL (ref 0.0–0.7)
Eosinophils Relative: 3 % (ref 0–5)
Eosinophils Relative: 5 % (ref 0–5)
Eosinophils Relative: 6 % — ABNORMAL HIGH (ref 0–5)
Lymphocytes Relative: 13 % (ref 12–46)
Lymphocytes Relative: 13 % (ref 12–46)
Lymphs Abs: 0.6 10*3/uL — ABNORMAL LOW (ref 0.7–4.0)
Lymphs Abs: 0.6 10*3/uL — ABNORMAL LOW (ref 0.7–4.0)
Lymphs Abs: 0.7 10*3/uL (ref 0.7–4.0)
Monocytes Absolute: 0.3 10*3/uL (ref 0.1–1.0)
Monocytes Relative: 5 % (ref 3–12)
Monocytes Relative: 5 % (ref 3–12)
Neutro Abs: 3.6 10*3/uL (ref 1.7–7.7)
Neutrophils Relative %: 68 % (ref 43–77)
Neutrophils Relative %: 72 % (ref 43–77)
Neutrophils Relative %: 77 % (ref 43–77)
Neutrophils Relative %: 77 % (ref 43–77)

## 2010-09-12 LAB — CBC
Hemoglobin: 10 g/dL — ABNORMAL LOW (ref 13.0–17.0)
Hemoglobin: 10 g/dL — ABNORMAL LOW (ref 13.0–17.0)
Hemoglobin: 13.4 g/dL (ref 13.0–17.0)
MCH: 35.6 pg — ABNORMAL HIGH (ref 26.0–34.0)
MCH: 36 pg — ABNORMAL HIGH (ref 26.0–34.0)
MCH: 36.1 pg — ABNORMAL HIGH (ref 26.0–34.0)
MCH: 36.2 pg — ABNORMAL HIGH (ref 26.0–34.0)
MCHC: 34.5 g/dL (ref 30.0–36.0)
MCHC: 34.7 g/dL (ref 30.0–36.0)
MCV: 104.2 fL — ABNORMAL HIGH (ref 78.0–100.0)
MCV: 104.4 fL — ABNORMAL HIGH (ref 78.0–100.0)
MCV: 104.6 fL — ABNORMAL HIGH (ref 78.0–100.0)
Platelets: 122 10*3/uL — ABNORMAL LOW (ref 150–400)
Platelets: 128 10*3/uL — ABNORMAL LOW (ref 150–400)
Platelets: 147 10*3/uL — ABNORMAL LOW (ref 150–400)
Platelets: 154 10*3/uL (ref 150–400)
RBC: 2.79 MIL/uL — ABNORMAL LOW (ref 4.22–5.81)
RBC: 2.81 MIL/uL — ABNORMAL LOW (ref 4.22–5.81)
RBC: 3.03 MIL/uL — ABNORMAL LOW (ref 4.22–5.81)
RBC: 3.31 MIL/uL — ABNORMAL LOW (ref 4.22–5.81)
RBC: 3.72 MIL/uL — ABNORMAL LOW (ref 4.22–5.81)
RDW: 12.4 % (ref 11.5–15.5)
WBC: 4.6 10*3/uL (ref 4.0–10.5)
WBC: 4.7 10*3/uL (ref 4.0–10.5)
WBC: 4.8 10*3/uL (ref 4.0–10.5)

## 2010-09-12 LAB — CARDIAC PANEL(CRET KIN+CKTOT+MB+TROPI)
CK, MB: 1.7 ng/mL (ref 0.3–4.0)
Relative Index: INVALID (ref 0.0–2.5)
Relative Index: INVALID (ref 0.0–2.5)
Total CK: 51 U/L (ref 7–232)
Troponin I: 0.02 ng/mL (ref 0.00–0.06)
Troponin I: 0.02 ng/mL (ref 0.00–0.06)
Troponin I: 0.02 ng/mL (ref 0.00–0.06)

## 2010-09-12 LAB — COMPREHENSIVE METABOLIC PANEL
ALT: 23 U/L (ref 0–53)
AST: 25 U/L (ref 0–37)
Albumin: 3.3 g/dL — ABNORMAL LOW (ref 3.5–5.2)
Alkaline Phosphatase: 106 U/L (ref 39–117)
CO2: 24 mEq/L (ref 19–32)
Chloride: 102 mEq/L (ref 96–112)
Creatinine, Ser: 0.74 mg/dL (ref 0.4–1.5)
GFR calc Af Amer: >60 mL/min (ref >60)
GFR calc non Af Amer: >60 mL/min (ref >60)
Potassium: 3.8 mEq/L (ref 3.5–5.1)
Sodium: 135 mEq/L (ref 135–145)
Total Bilirubin: 2.1 mg/dL — ABNORMAL HIGH (ref 0.3–1.2)

## 2010-09-12 LAB — CROSSMATCH: Antibody Screen: NEGATIVE

## 2010-09-12 LAB — URINE MICROSCOPIC-ADD ON

## 2010-09-12 LAB — URINALYSIS, ROUTINE W REFLEX MICROSCOPIC
Glucose, UA: NEGATIVE mg/dL
Hgb urine dipstick: NEGATIVE
Hgb urine dipstick: NEGATIVE
Ketones, ur: 40 mg/dL — AB
Nitrite: NEGATIVE
pH: 6 (ref 5.0–8.0)
pH: 6.5 (ref 5.0–8.0)

## 2010-09-12 LAB — ABO/RH: ABO/RH(D): A POS

## 2010-09-12 LAB — URINE CULTURE
Colony Count: NO GROWTH
Culture: NO GROWTH

## 2010-09-12 LAB — PROTIME-INR: Prothrombin Time: 14.2 seconds (ref 11.6–15.2)

## 2010-09-12 LAB — SEDIMENTATION RATE: Sed Rate: 12 mm/hr (ref 0–16)

## 2010-09-12 LAB — ALBUMIN: Albumin: 2.3 g/dL — ABNORMAL LOW (ref 3.5–5.2)

## 2010-09-13 ENCOUNTER — Encounter: Payer: Self-pay | Admitting: Orthopedic Surgery

## 2010-09-13 ENCOUNTER — Ambulatory Visit (INDEPENDENT_AMBULATORY_CARE_PROVIDER_SITE_OTHER): Payer: Medicare Other | Admitting: Orthopedic Surgery

## 2010-09-13 DIAGNOSIS — IMO0002 Reserved for concepts with insufficient information to code with codable children: Secondary | ICD-10-CM

## 2010-09-13 DIAGNOSIS — Z96649 Presence of unspecified artificial hip joint: Secondary | ICD-10-CM

## 2010-09-13 DIAGNOSIS — S72009A Fracture of unspecified part of neck of unspecified femur, initial encounter for closed fracture: Secondary | ICD-10-CM

## 2010-09-14 NOTE — Letter (Signed)
Summary: Patient medication discharge instructions  Patient medication discharge instructions   Imported By: Jacklynn Ganong 09/10/2010 11:31:29  _____________________________________________________________________  External Attachment:    Type:   Image     Comment:   External Document

## 2010-09-23 NOTE — Assessment & Plan Note (Signed)
Summary: POST OP 1/RT THA/MEDICARE,MUT OM/CAF   Visit Type:  Follow-up Primary Provider:  Lubertha South, MD  CC:  post op hip.  History of Present Illness: I saw [name] [name] in the office today for a followup visit.  He is a 77 years old man with the complaint of: post op 1 removal of hardware, right total hip replacement.  DOS 08/30/10.  POD 14.  Residing at [facility] for rehab.  Meds:  Ambien, Vitamin B12, Promethazine, Omeprazole, Gabapentin 300mg , Celexa, Calcium with D, Verapamil, Lovenox, Vicodin 5, Robaxin 500mg , Calcium Carbonate.  Pain level is around 3.  Walks with walker, walked 370ft today.    Allergies: 1)  ! Sulfa 2)  ! Percocet 3)  ! * Tramadol  Physical Exam  Additional Exam:   Surgical incision site has been checked, had looks fine with no signs of infection.     Impression & Recommendations:  Problem # 1:  NONUNION OF FRACTURE (ICD-733.82) Assessment Improved  Orders: Post-Op Check (16109)  Problem # 2:  HIP FRACTURE, RIGHT (ICD-820.8) Assessment: Improved  Orders: Post-Op Check (60454)  Problem # 3:  TOTAL HIP FOLLOW-UP (ICD-V43.64) Assessment: Improved  Orders: Post-Op Check (09811)  Patient Instructions: 1)  4 week appt  2)  home PT    Orders Added: 1)  Post-Op Check [91478]

## 2010-09-23 NOTE — Miscellaneous (Signed)
Summary: Nursing Home physician prog note  Nursing Home physician prog note   Imported By: Cammie Sickle 09/13/2010 15:19:42  _____________________________________________________________________  External Attachment:    Type:   Image     Comment:   External Document

## 2010-10-02 LAB — CBC
MCV: 108.2 fL — ABNORMAL HIGH (ref 78.0–100.0)
Platelets: 143 10*3/uL — ABNORMAL LOW (ref 150–400)
WBC: 4.2 10*3/uL (ref 4.0–10.5)

## 2010-10-02 LAB — BASIC METABOLIC PANEL
BUN: 7 mg/dL (ref 6–23)
Calcium: 8.6 mg/dL (ref 8.4–10.5)
Creatinine, Ser: 0.6 mg/dL (ref 0.4–1.5)
GFR calc non Af Amer: >60 mL/min (ref >60)

## 2010-10-02 LAB — URINALYSIS, ROUTINE W REFLEX MICROSCOPIC
Glucose, UA: NEGATIVE mg/dL
Protein, ur: NEGATIVE mg/dL

## 2010-10-02 LAB — ETHANOL: Alcohol, Ethyl (B): 173 mg/dL — ABNORMAL HIGH (ref 0–10)

## 2010-10-02 LAB — DIFFERENTIAL
Eosinophils Absolute: 0.1 10*3/uL (ref 0.0–0.7)
Lymphocytes Relative: 26 % (ref 12–46)
Lymphs Abs: 1.1 10*3/uL (ref 0.7–4.0)
Neutro Abs: 2.8 10*3/uL (ref 1.7–7.7)
Neutrophils Relative %: 67 % (ref 43–77)

## 2010-10-02 LAB — RAPID URINE DRUG SCREEN, HOSP PERFORMED
Barbiturates: NOT DETECTED
Benzodiazepines: NOT DETECTED

## 2010-10-11 ENCOUNTER — Telehealth: Payer: Self-pay | Admitting: *Deleted

## 2010-10-11 NOTE — Telephone Encounter (Signed)
No documentation 

## 2010-10-13 ENCOUNTER — Ambulatory Visit (INDEPENDENT_AMBULATORY_CARE_PROVIDER_SITE_OTHER): Payer: Medicare Other | Admitting: Orthopedic Surgery

## 2010-10-13 DIAGNOSIS — Z96649 Presence of unspecified artificial hip joint: Secondary | ICD-10-CM

## 2010-10-13 NOTE — Patient Instructions (Signed)
Start outpatient rehab

## 2010-10-13 NOTE — Progress Notes (Signed)
Postoperative #2 status post RIGHT total hip arthroplasty and hardware removal of the RIGHT hip.  This is a six-week followup.  He is doing well. Is progressing fine in therapy. He is ready for outpatient rehabilitation.  His leg lengths are just about equal. He has bilateral knee flexion contractures, worse on the LEFT, which interferes with measuring.  He is having minimal pain in the hip. He can get on and off the potty chair and getting in and out of the bed. He can ambulate with a walker full weightbearing.  He is to followup in 6 weeks.

## 2010-10-20 ENCOUNTER — Ambulatory Visit (HOSPITAL_COMMUNITY)
Admission: RE | Admit: 2010-10-20 | Discharge: 2010-10-20 | Disposition: A | Discharge status: Home / Self Care | Payer: Medicare Other | Source: Ambulatory Visit | Attending: Physical Therapy | Admitting: Physical Therapy

## 2010-10-20 DIAGNOSIS — IMO0001 Reserved for inherently not codable concepts without codable children: Secondary | ICD-10-CM | POA: Insufficient documentation

## 2010-10-20 DIAGNOSIS — M6281 Muscle weakness (generalized): Secondary | ICD-10-CM | POA: Insufficient documentation

## 2010-10-20 DIAGNOSIS — M25559 Pain in unspecified hip: Secondary | ICD-10-CM | POA: Insufficient documentation

## 2010-10-20 DIAGNOSIS — M25659 Stiffness of unspecified hip, not elsewhere classified: Secondary | ICD-10-CM | POA: Insufficient documentation

## 2010-10-20 DIAGNOSIS — R262 Difficulty in walking, not elsewhere classified: Secondary | ICD-10-CM | POA: Insufficient documentation

## 2010-10-22 ENCOUNTER — Ambulatory Visit (HOSPITAL_COMMUNITY)
Admission: RE | Admit: 2010-10-22 | Discharge: 2010-10-22 | Disposition: A | Discharge status: Home / Self Care | Payer: Medicare Other | Source: Ambulatory Visit | Attending: *Deleted | Admitting: *Deleted

## 2010-10-25 ENCOUNTER — Ambulatory Visit (HOSPITAL_COMMUNITY): Payer: Medicare Other | Admitting: *Deleted

## 2010-10-27 ENCOUNTER — Ambulatory Visit (HOSPITAL_COMMUNITY)
Admission: RE | Admit: 2010-10-27 | Discharge: 2010-10-27 | Disposition: A | Discharge status: Home / Self Care | Payer: Medicare Other | Source: Ambulatory Visit | Attending: Physical Therapy | Admitting: Physical Therapy

## 2010-10-27 DIAGNOSIS — M25559 Pain in unspecified hip: Secondary | ICD-10-CM | POA: Insufficient documentation

## 2010-10-27 DIAGNOSIS — M6281 Muscle weakness (generalized): Secondary | ICD-10-CM | POA: Insufficient documentation

## 2010-10-27 DIAGNOSIS — R262 Difficulty in walking, not elsewhere classified: Secondary | ICD-10-CM | POA: Insufficient documentation

## 2010-10-27 DIAGNOSIS — M25659 Stiffness of unspecified hip, not elsewhere classified: Secondary | ICD-10-CM | POA: Insufficient documentation

## 2010-10-27 DIAGNOSIS — IMO0001 Reserved for inherently not codable concepts without codable children: Secondary | ICD-10-CM | POA: Insufficient documentation

## 2010-10-29 ENCOUNTER — Ambulatory Visit (HOSPITAL_COMMUNITY)
Admission: RE | Admit: 2010-10-29 | Discharge: 2010-10-29 | Disposition: A | Discharge status: Home / Self Care | Payer: Medicare Other | Source: Ambulatory Visit | Attending: *Deleted | Admitting: *Deleted

## 2010-11-03 ENCOUNTER — Ambulatory Visit (HOSPITAL_COMMUNITY)
Admission: RE | Admit: 2010-11-03 | Discharge: 2010-11-03 | Disposition: A | Discharge status: Home / Self Care | Payer: Medicare Other | Source: Ambulatory Visit | Attending: *Deleted | Admitting: *Deleted

## 2010-11-05 ENCOUNTER — Ambulatory Visit (HOSPITAL_COMMUNITY)
Admission: RE | Admit: 2010-11-05 | Discharge: 2010-11-05 | Disposition: A | Discharge status: Home / Self Care | Payer: Medicare Other | Source: Ambulatory Visit | Attending: *Deleted | Admitting: *Deleted

## 2010-11-09 ENCOUNTER — Ambulatory Visit (HOSPITAL_COMMUNITY)
Admission: RE | Admit: 2010-11-09 | Discharge: 2010-11-09 | Disposition: A | Discharge status: Home / Self Care | Payer: Medicare Other | Source: Ambulatory Visit | Attending: Family Medicine | Admitting: Family Medicine

## 2010-11-09 NOTE — Procedures (Signed)
NAME:  [name] [name], [name] [name]                    ACCOUNT NO.:  [id number]   MEDICAL RECORD NO.:  [id number]          PATIENT TYPE:  OUT   LOCATION:  RAD                           FACILITY:  [facility]   PHYSICIAN:  Gerrit Friends. Dietrich Pates, MD, FACCDATE OF BIRTH:  [birth year]   DATE OF PROCEDURE:  DATE OF DISCHARGE:                                ECHOCARDIOGRAM   CLINICAL DATA:  A 77 year old gentleman with peripheral edema and  hypertension.  Aorta 3.8, septum 1.7, posterior wall 1.4, LV diastole  4.2, LV systole 3.2.  1. Technically adequate echocardiographic study.  2. Mild to moderate left and right atrial enlargement.  3. Mild right ventricular dilatation; no significant hypertrophy;      normal systolic function.  4. Mild sclerosis of a trileaflet aortic valve.  5. Normal aortic root diameter; mild calcification of the wall.  6. Normal tricuspid valve; physiologic regurgitation.  7. Normal mitral valve; trace regurgitation.  8. Normal proximal pulmonary artery; pulmonic valve not well seen, but      is grossly normal.  9. Normal left ventricular size; mild hypertrophy; normal regional and      global function.  10.Normal IVC.  11.Comparison with prior study of Nov 18, 2002:  Interval right      ventricular enlargement.      Gerrit Friends. Dietrich Pates, MD, [facility]  Electronically Signed     RMR/MEDQ  D:  01/19/2007  T:  01/19/2007  Job:  [id number]

## 2010-11-09 NOTE — Assessment & Plan Note (Signed)
[name]                       [facility] OFFICE NOTE   NAME:[name] [name]                           MRN:          [id number]  DATE:02/14/2007                            DOB:          21-Nov-1932    [name] [name] is referred today by Dr. Lubertha South.  He wanted increasing  dyspnea and lower extremity edema evaluated.  The patient has been  having increasing lower extremity edema over the last month or two.  The  patient has previously had some lower extremity edema, but it has been  much worse.  He says that his ankle have swelled in the past due to  arthritis, however, he has had such extensive lower extremity swelling  over the last month or two that he has had treatment for cellulitis, and  he has had some weeping lesions with sores on both legs.   He has not had any fever or systemic infection, but he has been told by  Dr. Gerda Diss that he had cellulitis.   Patient denies history of DVT or venous insufficiency.   He had an abdominal CT scan, which showed no evidence of ascites.  There  was a left pleural effusion.  There was no IVC thrombosis with really no  explanation for his lower extremity edema.  He had a 2D echocardiogram,  which I read on January 19, 2007.  He has normal LV function.  He had mild  right ventricular dilatation, which was somewhat new.  There were no  significant valvular abnormalities.   The patient also has been having dyspnea.  In talking to his wife, she  thinks this is chronic.  He does not carry a diagnosis of COPD,  pulmonary hypertension, or sleep apnea.  However, his wife says he  snores quite a bit.  He actually sleeps in his own room.  He has not had  a previous sleep study.   The patient does not have a history of coronary artery disease.  He has  had a history of atrial fibrillation, and was on Coumadin up until a  couple of years ago.   He has never had decreased LV function or evidence of cor pulmonale.   In  regards to his heart, he said that Dr. Gerda Diss felt that his rhythm has  been stable.  He has not had palpitations, PND, or orthopnea.   REVIEW OF SYSTEMS:  Otherwise, remarkable for significant arthritis in  his hips.  He is trying to see if Dr. Thurston Hole will operate on them.  I  explained to him that he is currently not in any shape to be operated  on.  He also has some chronic sinus problems, and previous infection in  his lung.   PAST MEDICAL HISTORY:  Remarkable for:  1. Hypertension treated with verapamil over the last 3 to 4 years.  2. Mild ankle edema.  3. Previous reflux with a hiatal hernia.  4. Chronic atrial fibrillation.  5. Previous cavitary lesion in the left lung with chronic scarring.  6. History of dysphagia with esophageal dilatation.   HE DENIES  ANY ALLERGIES.   FAMILY HISTORY:  Noncontributory.   He is a retired Visual merchandiser.  He likes to work on a Financial risk analyst.  He is happily  married, and has 2 older children.  He does not drink or smoke.   There has been no history of ascites or liver disease.  There has been  no history of hepatitis or cirrhosis.   CURRENT MEDICATIONS:  Include:  1. Triamterene/hydrochlorothiazide.  2. Amoxicillin.  3. Verapamil 240 a day.  4. He takes potassium as well.  He does not know the dosage.   EXAMINATION:  Remarkable for an overweight elderly white male in no  distress.  Respiratory rate is up a bit at 20.  Blood pressure is 112/78.  Pulse is  68.  He appears regular.  Weight is 230.  He is afebrile.  Affect is appropriate.  HEENT:  Normal.  I cannot delineate any JVP elevation or V wave.  No carotid bruits.  No  lymphadenopathy.  No  thyromegaly.  LUNGS:  Clear with good diaphragmatic motion.  No wheezing.  He has  mildly decreased breath sounds at the left base.  There is an S1 and S2 with distant heart sounds.  There is no obvious  murmur.  ABDOMEN:  Protuberant.  There is no ascites.  No AAA.  No tenderness.  Bowel sounds  positive.  No hepatosplenomegaly or hepatojugular reflux.  Distal pulses are intact.  He has +2 to 3 edema bilaterally with some  weeping lesions and blisters on both legs.  NEUROLOGIC:  Nonfocal.  There is no muscular weakness.  EKG:  Was not done today.  The last EKG we have in our office from 2004  showed atrial fibrillation and nonspecific ST-T wave changes.   IMPRESSION:  1. Increasing lower extremity edema.  Not clear of the etiology.  He      clearly needs to be on a loop diuretic.  I will place him on Lasix      40 b.i.d.  He will take his potassium twice a day.  We will check a      BMET and a BNP in 2 weeks after some of his swelling goes down.  We      will check a venous duplex for insufficiency of post phlebitic      syndrome.  2. Cellulitis.  Recent antibiotic treatment.  Patient should probably      see a wound center, particularly if his lower extremity edema is      difficult to treat as it has been.  He is at risk for super      infection.  He needs some of these blisters that have been breaking      covered with sterile gauze.  I asked him to talk to Dr. Gerda Diss      about referral to a wound center.  3. Chronic atrial fibrillation.  Good rate control on verapamil.  I do      not think this has anything to do with the swelling.  He has been      on the verapamil for years.  He apparently is currently in sinus      rhythm.  We will check an EKG.  He was on Coumadin previously, and      there does not seem to be an indication to reinstitute it.  4. Chronic hip pain.  Possible need for hip surgery.  Clearly, his      edema needs to be improved  before any of this happens.  His left      ventricular function is normal by echo, and there is no documented      history of coronary artery disease.  He will continue his aspirin      and antiinflammatory agents.  5. I explained to the patient that I was somewhat concerned about the      right ventricular enlargement on his echo,  and his increased lower      extremity edema.  Particularly, if he does not respond to b.i.d.      Lasix, it may be worthwhile to proceed with right heart      catheterization to assess for pulmonary hypertension, and see what      his filling pressures are.  We will make this decision in a few      weeks after we have had him on appropriate therapy for a little      while longer.  6. Dyspnea.  Seems functional.  The patient should probably have a      sleep study.  He is a primary candidate for sleep apnea.  I will      leave it up to Dr. Gerda Diss to see if he wants to do overnight      oximetry, and particularly if he is found to have pulmonary      hypertension as a cause of his right-sided edema, it may very well      be from sleep apnea physiology.     Noralyn Pick. Eden Emms, MD, [facility]  Electronically Signed    PCN/MedQ  DD: 02/14/2007  DT: 02/15/2007  Job #: [id number]

## 2010-11-09 NOTE — Assessment & Plan Note (Signed)
[facility]                       [place] [facility] OFFICE NOTE   NAME:[name] [name], [name] [name]                           MRN:          [id number]  DATE:04/02/2007                            DOB:          Dec 16, 1932    The patient returns today for follow-up.  I initially saw him on February 14, 2007, for increasing dyspnea and lower extremity edema.  The patient  has normal LV function.  He had RV dilatation by echo.  We have placed  him on Lasix 40 mg b.i.d.  Unfortunately, he continues to have  significant +2-3 lower extremity edema bilaterally.  His venous Duplex  scan was negative.  He has significant pruritus in his lower  extremities.  He continues to scratch these areas and have small areas  of ulcers.  He is at high risk for recurrent cellulitis.  I believe he  was placed on a course of penicillin by Dr. Gerda [name].  I have encouraged  him to see a wound care center, but I do not think this has been  arranged yet.  The patient has run out of his hydrochlorothiazide  triamterene.   He has not had any recurrent palpitations.  He has a history of PAF.  He  is only taking aspirin and is not on Coumadin.   I think in particular since the patient will likely be preoperative for  hip surgery with [name] Maduro A. Thurston [name], M.D. that he needs further  evaluation.  I was not impressed with his response to Lasix at a fairly  good dose of 40 b.i.d.  I explained to him previously that I would  consider doing a right and left heart catheterization to rule out  pulmonary hypertension and assess filling pressures if he did not  respond to Lasix and I think this is now in order.  We will check his  lab work today and arrange this for next week.   I suspect Dr. Thurston [name] would not want to proceed with any hip surgery  until we have identified any coronary disease, assessed his filling  pressures, and ruled out significant pulmonary hypertension.   REVIEW OF SYSTEMS:  Otherwise  remarkable for chronic sinus problems and  a cough.  He has had two previous sinus surgeries and continues to be  bothered by this.   He has not had PND or orthopnea.  Review of systems is otherwise  negative.   CURRENT MEDICATIONS:  1. Triamterene hydrochlorothiazide to be stopped.  2. Verapamil 240 mg a day.  3. KCL 20 b.i.d.  4. Lasix 40 mg b.i.d.  5. Zaroxolyn 5 mg a day to be added.   PHYSICAL EXAMINATION:  VITAL SIGNS:  Weight 187, blood pressure 128/66,  pulse 72 and regular, he is afebrile.  Respiratory rate is 18.  HEENT:  Unremarkable.  NECK:  Supple.  JVP is not visible.  There is no lymphadenopathy, no  thyromegaly, and no carotid bruits.  LUNGS:  Clear with good diaphragmatic motion and no wheezing.  CHEST:  Normal.  There is an S1 and S2 with normal heart sounds.  PMI is  normal.  ABDOMEN:  Protuberant.  There is no hepatosplenomegaly, hepatojugular  reflux.  No AAA.  No tenderness.  Femorals are +3 bilaterally without  bruit.  PT's are +2.  There is +2-3 lower extremity edema bilaterally  with excoriations and small ulcers.  His legs actually look a little  less inflamed and erythematous the last time I saw him, however, his  swelling persists.  As indicated, I reviewed his venous Duplex study  which was negative for DVT.   LABORATORY DATA:  His lab work from March 02, 2007, showed a  potassium of 4.7, creatinine of 0.6, BUN 11, BNP 251.   IMPRESSION:  1. Continued lower extremity edema, rule out pulmonary hypertension.      Right heart catheterization to be scheduled next week.  2. Cellulitis.  Follow up with Dr. Gerda [name].  Recent course of      penicillin.  Advised to take p.r.n. Benadryl to decrease pruritus.  3. History of paroxysmal atrial fibrillation.  Continue aspirin      therapy.  No evidence of recurrence.  4. Preoperative clearance in regards to need for bilateral hip      replacements.  Left side worse than right.  In light of this, I      think  it is particularly important to assess the patient's EDP,      rule out coronary artery disease, and assess for pulmonary      hypertension.  5. Chronic sinus problems.  Take p.r.n. Claritin.  No evidence of      recurrent infection at this time on recent antibiotics for      cellulitis.   Further recommendations will be based on the results of his right and  left heart catheterization.     [name] Pick. Eden Emms, MD, [facility]  Electronically Signed    PCN/MedQ  DD: 04/02/2007  DT: 04/02/2007  Job #: [id number]

## 2010-11-09 NOTE — Cardiovascular Report (Signed)
NAME:  [name] [name], [name] [name]                    ACCOUNT NO.:  [id number]   MEDICAL RECORD NO.:  [id number]          PATIENT TYPE:  OIB   LOCATION:  1961                         FACILITY:  [facility]   PHYSICIAN:  Peter C. Eden Emms, MD, FACCDATE OF BIRTH:  March 26, 1933   DATE OF PROCEDURE:  04/10/2007  DATE OF DISCHARGE:  04/10/2007                            CARDIAC CATHETERIZATION   [name] is a 77 year old  patient with increasing shortness of breath,  lower extremity edema refractory to initial diuretic therapy. Right and  left heart catheterization was done to assess filling pressures and rule  out coronary disease.   Cine catheterization was done with the 4-French arterial sheath and a 7-  French venous sheath   JL-5 catheter was used to engage the left main due to mild aortic root  dilatation.   Left main coronary artery had a distal 40% stenosis.  The proximal to  mid LAD at the takeoff of first diagonal branch had a 40% tubular  lesion.  The distal LAD was small and did not reach the apex.  First  diagonal branch had 20-30% multiple discrete lesions.   Circumflex coronary artery was nondominant.  There was 30% multiple  discrete lesions in the first obtuse marginal branch.   Right coronary artery was large and dominant.  There was a 30% eccentric  stenosis in the distal RCA and 30% multiple discrete lesions in the PDA.   There was no critical coronary artery disease.   RAO ventriculography:  RAO ventriculography showed good LV function with  an EF of 60%.  There was no gradient across the aortic valve and no MR.  Aortic pressure was 123/68. LV pressure was 120/9.   Right heart catheterization was done due to the patient's significant  lower extremity edema and dyspnea.   He also had a question of RV dilatation by echocardiogram.  His BMP was  186.   Mean right atrial pressure was 10. RV pressure was 35/11. PA pressure  was 35/17 with a mean of 24.  Mean pulmonary capillary wedge  pressure  was 17.   IMPRESSION:  The patient does not have significant pulmonary  hypertension.  The filling pressures were currently in an adequate range  with his current dose of diuretics. This would indicate primarily lower  extremity venous disease.  He will follow up with the wound care center   He will follow up with Dr. Gerda Diss in regards to this and his recent  cellulitis.  We will continue him on his current dose of diuretics.   There is no evidence of significant pulmonary hypertension.  He has good  LV function and no coronary disease.   He does appear to have frequent PACs and may be at risk for atrial  fibrillation in the future.  We will have to follow this.  I will see  him back in the office in 3 weeks.      Noralyn Pick. Eden Emms, MD, [facility]  Electronically Signed     PCN/MEDQ  D:  04/10/2007  T:  04/10/2007  Job:  [id number]  cc:   Donna Bernard, M.D.

## 2010-11-10 ENCOUNTER — Ambulatory Visit (HOSPITAL_COMMUNITY): Payer: Medicare Other | Admitting: Physical Therapy

## 2010-11-12 ENCOUNTER — Ambulatory Visit (HOSPITAL_COMMUNITY): Payer: Medicare Other

## 2010-11-12 NOTE — Procedures (Signed)
   NAME:  [name] [name], [name] [name]                              ACCOUNT NO.:  [id number]   MEDICAL RECORD NO.:  [id number]                   PATIENT TYPE:  OUT   LOCATION:  RAD                                  FACILITY:  [facility]   PHYSICIAN:  Vida Roller, M.D.                DATE OF BIRTH:  [birth year]   DATE OF PROCEDURE:  11/18/2002  DATE OF DISCHARGE:                                  ECHOCARDIOGRAM   REFERRING PHYSICIAN:  Dr. Lorin Picket A. Luking.   TAPE NUMBER:  [id number].   TAPE COUNT:  [id number] through [id number].   INDICATION:  This is a 77 year old man with atrial fibrillation, heart  failure and hypertension.   COMMENT:  Technical quality of the study is difficult.   M-MODE TRACINGS:  The aorta is 38 mm.   The left atrium is 52 mm.   The septum is 15 mm.   The posterior wall is 12 mm.   The left ventricular diastolic dimension is 45 mm.   Left ventricular systolic dimension is 31 mm.   TWO-DIMENSIONAL AND DOPPLER IMAGING:  The left ventricle is normal size with  normal systolic function.  There are no obvious wall motion abnormalities  seen.  Diastolic function was not assessed.   The right ventricle is normal size with normal systolic function.   Both atria appear to be enlarged, the left greater than right.  There is no  obvious atrioseptal defect.   The aortic valve is sclerotic with evidence of very mild stenosis with a  peak gradient of 12 mmHg.   The mitral valve is morphologically unremarkable with mild insufficiency and  no stenosis is seen.   The tricuspid valve is morphologically unremarkable with mild insufficiency.  No stenosis is seen.   The pulmonic valve was not well-seen.   The aorta appears to be mildly enlarged in a number of views.  It is a  difficult study, so assessment of exactly where the enlargement is is  challenging.  CT scan of the aorta is recommended.   The pericardial structures appear normal.   The inferior vena cava is not well-seen.                                      Vida Roller, M.D.    JH/MEDQ  D:  11/18/2002  T:  11/18/2002  Job:  [id number]

## 2010-11-12 NOTE — Procedures (Signed)
[place]  Patient:    ELSIE, SAKUMA                           MRN: [id number] Proc. Date: 09/27/00 Adm. Date:  [id number] Disc. Date: [id number] Attending:  Mardella Layman                           Procedure Report                            ESOPHAGEAL MANOMETRY  1. UPPER ESOPHAGEAL SPHINCTER:  There appeared to be normal coordination    between pharyngeal contraction and cricopharyngeal relaxation.  2. LOWER ESOPHAGEAL SPHINCTER:  Mean pressure is somewhat high at 32 mmHg, but    there was normal relaxation to swallowing.  3. MOTILITY PATTERN:  There were peristaltic waves throughout the length of    the esophagus of to wet and dry swallows.  However, the mean amplitude of    contractions was 225 mmHg, which is markedly over the normal range of 30 to    180 mmHg.  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   RECOMMENDATIONS:  We will see Mr. Mccauley in office follow-up and schedule him for appropriate therapy with anticholinergics or nitrates as tolerated. DD:  09/27/00 TD:  09/27/00 Job: [id number]

## 2010-11-12 NOTE — Op Note (Signed)
NAME:  [name] [name], [name] [name]                              ACCOUNT NO.:  [id number]   MEDICAL RECORD NO.:  [id number]                   PATIENT TYPE:  INP   LOCATION:  2899                                 FACILITY:  [facility]   PHYSICIAN:  Salvatore Decent. Dorris Fetch, M.D.         DATE OF BIRTH:  03/02/1933   DATE OF PROCEDURE:  01/07/2003  DATE OF DISCHARGE:                                 OPERATIVE REPORT   PREOPERATIVE DIAGNOSIS:  Right lower lobe mass.   POSTOPERATIVE DIAGNOSIS:  Probable infarcted right lower lobe.   SURGEON:  Salvatore Decent. Dorris Fetch, M.D.   ASSISTANT:  Rowe Clack, P.A.-C.   ANESTHESIA:  General.   OPERATIVE FINDINGS:  Bronchoscopy within normal limits.  Right VATS - 2 x 3  cm firm, rounded nodular area, appeared consolidated.  On frozen section  consistent with infarct.   INDICATIONS FOR PROCEDURE:  [name] [name] is a 77 year old  gentleman who  approximately six weeks prior to this admission had developed a severe cough  associated with hemoptysis.  A work-up was performed which included a CT  scan which showed a mass deep in the superior sulcus of the right lower  lobe.  This was felt to be potentially neoplastic.  Other considerations in  the differential diagnosis included organizing pneumonia or fungal or AFB  infection.  Given the patient's age and multiple risk factors, he was  advised to undergo excisional biopsy for definitive diagnosis.  The  indications, risks, benefits and alternatives were discussed in detail with  the patient and his family.  [name] [name] understood the risks, accepted them and  agreed to proceed.  The patient was on Coumadin for atrial fibrillation and  needed to be off of the Coumadin for one week prior to surgery.   DESCRIPTION OF PROCEDURE:  [name] [name] was brought to the preop holding area on  January 07, 2003. Central venous access was obtained and arterial blood  pressure monitoring catheter was placed by the anesthesia service.  He was  taken to the  operating room, anesthetized and intubated.  Intravenous  antibiotics had been administered in preop holding.  PAS hose were placed  for DVT prophylaxis and Foley catheter was placed.   Flexible fiberoptic bronchoscopy was carried out via the endotracheal tube.  It revealed normal endobronchial anatomy and no endobronchial lesions.   The patient then was positioned in the left lateral decubitus position.  The  right chest was prepped and draped in the usual fashion.  Single lung  ventilation of the left lung was carried out.  The patient tolerated this  well throughout the procedure.  A transverse incision was made in  approximately the seventh intercostal space.  This was carried through the  skin and subcutaneous tissue.  Hemostasis was achieved with electrocautery.  The chest was entered bluntly using the hemostasis.  A port was inserted and  the thoracoscope was placed  into the chest.  Additional incisions were made  anteriorly and posteriorly in the fifth intercostal space for  instrumentation.  The right lower lobe was inspected.  Of note, there was no  significant pleural effusion and the visceral and parietal pleural surfaces  appeared normal.  The right lower lobe was inspected.  At the base  posteriorly there was an approximately 2 x 3 cm firm nodular area.  This was  removed with multiple findings of endoscopic stapler.  A TSB stapler was  used initially, however, because of some hemorrhage, EZ-45 stapler was used  to complete the staple line in the area where the lung was thicker.  There  was good hemostasis at all staple lines.  Specimen was removed easily via  the posterior port incision.  Palpation confirmed removal of the area in  question.  It was incised and had a dense, brown character.  It was not  consistent with a neoplasm.  A small sliver of the specimen was submitted  for bacterial, fungal and AFB cultures.  The specimen was sent for frozen  section.  Findings on  frozen section were consistent with a pulmonary  infarct.   The scope was reinserted into the chest.  The staple line was once again  inspected and had good hemostasis. There was good hemostasis at the port  site. The 28 French chest tube was placed through the anterior port site and  directed to the apex and secured with two #1 silk sutures.  The scope was  withdrawn.  The remaining two incisions were closed with 0 Vicryl fascial  suture, 2-0 Vicryl subcutaneous suture and 3-0 Vicryl subcuticular suture.  [name] [name] was extubated in the operating room and taken to the recovery room  extubated and in stable condition.  All sponge, needle and instrument counts  were correct at the end of the procedure. There were intraoperative  complications.                                                Salvatore Decent Dorris Fetch, M.D.    SCH/MEDQ  D:  01/07/2003  T:  01/07/2003  Job:  [id number]   cc:   Donna Bernard, M.D.  [address]  [place]  [place] [address]  Fax: [phone]   Vida Roller, M.D.  Fax: [phone]

## 2010-11-12 NOTE — Consult Note (Signed)
NAME:  [name] [name], [name] [name]                              ACCOUNT NO.:  [id number]   MEDICAL RECORD NO.:  [id number]                   PATIENT TYPE:  OUT   LOCATION:  RAD                                  FACILITY:  [facility]   PHYSICIAN:  Charlton Haws, M.D.                  DATE OF BIRTH:  July 25, 1932   DATE OF CONSULTATION:  DATE OF DISCHARGE:  12/13/2002                                   CONSULTATION   Mr. Sharron is seen today as a new patient.  He is referred for Afib.  The  patient has no previously documented coronary disease.  He is 77 years old.  Apparently he has been noted to be Afib since May.  He has been on Coumadin  for the last six weeks or so.  Unfortunately he may have lung cancer.  He  has had an abnormal chest x-ray with a cavitary lesion seen on CT scan.  He  has been referred to one of the thoracic surgeons in [facility] for possible  biopsy.  He really has not smoked very much.  When he was younger he smoked  a little bit but he has not really had a strong smoking habit.  He did work  in the tobacco fields for a long time and may have been exposed to spores or  fungus.   He does not relate any exposure to asbestos.  He has not had any significant  chest pain.  He is fairly unaware of his Afib.  He does occasionally get  palpitations.  He has been having some diaphoresis and shortness of breath.  I suspect that more of his symptoms have to do with his lung problem then  cardiac.   He was placed on Cardizem and this seems to be controlling his rate fairly  well.  His Coumadin levels have been up and down.  He has had a productive  cough occasionally with blood tinged sputum.  This may be related to high  INR and again I think that there may be a need for bronchoscopy in the  future.   The patient had a 2D echocardiogram performed on Nov 18, 2002.  It showed  totally normal AV function with aortic valve sclerosis.   EXAMINATION:  VITAL SIGNS:  Blood pressure is 120/70, pulse is  79 and  irregular.  LUNGS:  He has rhonchi at both bases, right greater then left.  NECK:  There is no cervical adenopathy.  Carotids are normal.  HEART:  There is normal S1, S2.  Distant heart sounds.  ABDOMEN:  Benign.  LOWER EXTREMITIES:  Intact pulses.  No edema.   EKG shows atrial fibrillation with nonspecific ST/T wave changes.   IMPRESSION:  I do not think that Mr. Rieger's symptoms are really related to  his heart.  His Afib is well rate controlled.  There is no need  to do  anything differently at this point in regards to anticoagulation and rate  control.  We need to have further clarification of what the workup will be  in regards to possible lung cancer.  The patient will have a Cardiolite  study next week just to clear him for any potential surgery and biopsy.   He will followup with Dr. Dorethea Clan in a few weeks.  I suggested to him that he  talk to Dr. Gerda Diss about expectorant or cough suppressants.  There is no  evidence  that this would relate to congestive heart failure, since he has  totally normal LV function and he is not hypertensive.   If there is further question about this a BNP level can be drawn but I do  not see the reason for this.  After his lung situation is clarified that he  would certainly be a candidate for possible cardioversion but for the time  being rate control and anticoagulation is adequate.                                                Charlton Haws, M.D.    PN/MEDQ  D:  12/20/2002  T:  12/20/2002  Job:  [id number]   cc:   Lorin Picket A. Gerda Diss, M.D.  [address], [address]  [place]  [place] [address]  Fax: [phone]

## 2010-11-12 NOTE — Discharge Summary (Signed)
NAME:  ARK, AGRUSA                              ACCOUNT NO.:  [id number]   MEDICAL RECORD NO.:  [id number]                   PATIENT TYPE:  INP   LOCATION:  2003                                 FACILITY:  [facility]   PHYSICIAN:  Salvatore Decent. Dorris Fetch, M.D.         DATE OF BIRTH:  [birth year]   DATE OF ADMISSION:  01/07/2003  DATE OF DISCHARGE:  01/12/2003                                 DISCHARGE SUMMARY   ADMISSION DIAGNOSIS:  Right lower lobe lung mass.   PAST MEDICAL HISTORY:  1. Recent onset atrial fibrillation.  2. Congestive heart failure.  3. Hypertension.  4. Bronchitis.  5. Depression and nervousness.  6. Degenerative joint disease.  7. Gastroesophageal reflux.   ALLERGIES:  The patient has no known drug allergies.   DISCHARGE DIAGNOSES:  Infarction of right lower lobe of the lung, status  post right lower lobe lung wedge resection.   BRIEF HISTORY:  Mr. Rhinesmith is a 77 year old Caucasian man. Approximately six  weeks prior to admission, he developed a severe cough associated with  hemoptysis. Workup was performed which included CT scan that revealed a mass  deep in the superior sulcus of the right lower lobe. This was thought to be  potentially neoplastic. Mr. Mckelvie was referred to [facility] for consideration of  surgical intervention. He was evaluated in the office by Dr. Charlett Lango on December 27, 2002. After examination of the patient and review of  the available records, Dr. Dorris Fetch recommended proceeding with a  bronchoscopy followed by video-assisted thoracostomy and wedge resection for  definitive diagnosis. The procedure, risks, and benefits were all discussed  with Mr. Cortopassi and his wife. His questions were all answered, and he agreed to  proceed with surgery.   HOSPITAL COURSE:  On January 07, 2003, Mr. Flett would like to be admitted to  [facility] under the care of Dr. Edwina Barth. He underwent  the following surgical procedures:  1. Flexible  bronchoscopy.  2. Right video-assisted thoracoscopy with wedge resection of the right lower     lobe of his lung.   The wedge resection specimen was sent to pathology, and on frozen section,  findings were consistent with infarct. Mr. Craton tolerated this procedure  well, transferred in stable condition to the PACU. He was extubated  immediately following the procedure, and he awoke from anesthesia  neurologically intact. He remained hemodynamically stable in the immediate  postoperative period. Because of the findings of infarction of his lung  tissue at surgery, Mr. Angerer was started on DVT prophylaxis with Lovenox while  his Coumadin was restarted. He also underwent a lower extremity venous  evaluation which was negative for DVT, superficial thrombosis, or Baker's  cyst bilaterally. Mr. Ziller's postoperative course has been essentially  eventful. He has remained in atrial fibrillation. His rate has remained  controlled. His Coumadin has been restarted, and his INR level is rising  slowly.   The morning of July 17, postoperative day #4, Mr. Miller reports feeling much  better. His vital signs were stable at blood pressure 100/56; he is  afebrile; his room air saturation is 95%. His heart remains in atrial  fibrillation with rate in the 90s. His incision is healing well. His bowel  function is returning, and his urine output is adequate. He is ambulating  independently, and his pain is well controlled. If Mr. Fawver continues to  remain stable and his INR continues to rise, we anticipate that he will be  ready for discharge home tomorrow January 12, 2003.   LABORATORY DATA:  On July 17, PT 15.0, INR 1.3. Chemistries include:  Sodium  139, potassium 4.0, BUN 11, creatinine 0.8, glucose 91. CBC on July 14:  White blood cells 5.0, hemoglobin 12.9, hematocrit 37.6, platelets 187.   CONDITION ON DISCHARGE:  Improved.   INSTRUCTIONS ON DISCHARGE:  1. Resumption of his home medications.  2. Coumadin,  probable home dose at 2.5 mg p.o. q.d. He will be asked to have     his PT/INR level checked on Monday, July 19.  3. Also resume Cardizem LA 120 mg q.d.  4. Prilosec 20 mg q.d.  5. Advair 250/50 one puff b.i.d.  6. Add Senokot two tablets in the evening p.r.n.  7. For pain, he may have Tylox one to two p.o. q.4-6h. p.r.n.   ACTIVITY:  He has been asked to refrain from any driving or heavy lifting at  this time.   DIET:  No restrictions.   WOUND CARE:  He should shower daily with mild soap and water. If his  incisions show any sign of infection or if he has a fever greater than 101  degrees Fahrenheit, he is to call Dr. Sunday Corn office.   FOLLOW UP:  1. He is to have his PT and INR checked on Monday, July 19. Results will be     called to Dr. Zack Seal office.  2.     Dr. Dorris Fetch would like to see him back in the office in about three     weeks. The office will call with the date and time for that appointment.     He is to have a chest x-ray that day prior to his appointment with Dr.     Dorris Fetch.     Toribio Harbour, N.P.                  Salvatore Decent Dorris Fetch, M.D.    CTK/MEDQ  D:  01/11/2003  T:  01/13/2003  Job:  [id number]   cc:   Donna Bernard, M.D.  [address]  [place]  [place] [address]  Fax: [phone]    cc:   Donna Bernard, M.D.  [address]  [place]  [place] [address]  Fax: [phone]

## 2010-11-12 NOTE — Op Note (Signed)
NAME:  Gibbs, Rihaan                    ACCOUNT NO.:  [id number]   MEDICAL RECORD NO.:  [id number]          PATIENT TYPE:  AMB   LOCATION:  DAY                           FACILITY:  [facility]   PHYSICIAN:  Lionel December, M.D.    DATE OF BIRTH:  [birth year]   DATE OF PROCEDURE:  09/29/2004  DATE OF DISCHARGE:                                 OPERATIVE REPORT   PROCEDURE:  Colonoscopy.   INDICATIONS:  Stephannie Peters is a 77 year old Caucasian male who is here for  screening colonoscopy.  Procedure was reviewed with the patient and informed  consent was obtained.   PREOPERATIVE MEDICATIONS:  Demerol 25 mg IV, Versed 4 mg IV in divided  doses.   FINDINGS:  The procedure was performed in the endoscopy suite.  The  patient's vital signs and O2 saturation were monitored during the procedure  and remained stable.  The patient was placed in the left lateral recumbent  position.  Rectal examination was performed.  No abnormality noted on  external or digital exam.  Olympus video scope was placed in the rectum and  advanced under vision into the sigmoid colon and beyond.  Preparation was  excellent.  A few scattered small diverticula were noted in the sigmoid  colon.  The scope was passed to the cecum which was identified by the  ileocecal valve and appendiceal orifice.  Pictures taken for the record.  As  the scope was withdrawn, colonic mucosa was once again carefully examined  and there were no polyps or other mucosal abnormalities.  Rectal mucosa  similarly was normal.  Scope was retroflexed to examine the anorectal  junction and moderate size hemorrhoids were noted  below the dentate line.  Endoscope was straightened and withdrawn.  The patient tolerated the  procedure well.   FINAL DIAGNOSES:  1.  A few small scattered diverticula in the sigmoid colon.  2.  External hemorrhoids.  3.  Otherwise normal exam.   RECOMMENDATIONS:  1.  High-fiber diet.  2.  Chew two tablets of FiberChoice daily.  3.  He  should get yearly hemoccults and consider next screening exam 10      years from now.      NR/MEDQ  D:  09/29/2004  T:  09/29/2004  Job:  [id number]   cc:   Donna Bernard, M.D.  [address]  [place]  [place] [address]  Fax: [phone]

## 2010-11-15 ENCOUNTER — Ambulatory Visit (HOSPITAL_COMMUNITY)
Admission: RE | Admit: 2010-11-15 | Discharge: 2010-11-15 | Disposition: A | Discharge status: Home / Self Care | Payer: Medicare Other | Source: Ambulatory Visit | Attending: *Deleted | Admitting: *Deleted

## 2010-11-17 ENCOUNTER — Ambulatory Visit (HOSPITAL_COMMUNITY)
Admission: RE | Admit: 2010-11-17 | Discharge: 2010-11-17 | Disposition: A | Discharge status: Home / Self Care | Payer: Medicare Other | Source: Ambulatory Visit | Attending: *Deleted | Admitting: *Deleted

## 2010-11-19 ENCOUNTER — Ambulatory Visit (HOSPITAL_COMMUNITY)
Admission: RE | Admit: 2010-11-19 | Discharge: 2010-11-19 | Disposition: A | Discharge status: Home / Self Care | Payer: Medicare Other | Source: Ambulatory Visit | Attending: Physical Therapy | Admitting: Physical Therapy

## 2010-11-19 DIAGNOSIS — R262 Difficulty in walking, not elsewhere classified: Secondary | ICD-10-CM | POA: Insufficient documentation

## 2010-11-19 DIAGNOSIS — M6281 Muscle weakness (generalized): Secondary | ICD-10-CM | POA: Insufficient documentation

## 2010-11-19 DIAGNOSIS — M25659 Stiffness of unspecified hip, not elsewhere classified: Secondary | ICD-10-CM | POA: Insufficient documentation

## 2010-11-19 DIAGNOSIS — M25559 Pain in unspecified hip: Secondary | ICD-10-CM | POA: Insufficient documentation

## 2010-11-19 DIAGNOSIS — IMO0001 Reserved for inherently not codable concepts without codable children: Secondary | ICD-10-CM | POA: Insufficient documentation

## 2010-11-23 ENCOUNTER — Ambulatory Visit (HOSPITAL_COMMUNITY)
Admission: RE | Admit: 2010-11-23 | Discharge: 2010-11-23 | Disposition: A | Discharge status: Home / Self Care | Payer: Medicare Other | Source: Ambulatory Visit | Attending: *Deleted | Admitting: *Deleted

## 2010-11-24 ENCOUNTER — Ambulatory Visit (HOSPITAL_COMMUNITY)
Admission: RE | Admit: 2010-11-24 | Discharge: 2010-11-24 | Disposition: A | Discharge status: Home / Self Care | Payer: Medicare Other | Source: Ambulatory Visit | Attending: *Deleted | Admitting: *Deleted

## 2010-11-24 ENCOUNTER — Encounter: Payer: Self-pay | Admitting: Orthopedic Surgery

## 2010-11-24 ENCOUNTER — Ambulatory Visit (INDEPENDENT_AMBULATORY_CARE_PROVIDER_SITE_OTHER): Payer: Medicare Other | Admitting: Orthopedic Surgery

## 2010-11-24 DIAGNOSIS — S72009A Fracture of unspecified part of neck of unspecified femur, initial encounter for closed fracture: Secondary | ICD-10-CM

## 2010-11-24 DIAGNOSIS — Z96649 Presence of unspecified artificial hip joint: Secondary | ICD-10-CM

## 2010-11-24 NOTE — Progress Notes (Signed)
3.5.2012 RT HWR RT THA  Doing well   Ambulating with a walker   In PT at [facility]   Leg are of equal length   Return in 2 months

## 2010-11-26 ENCOUNTER — Ambulatory Visit (HOSPITAL_COMMUNITY): Payer: Medicare Other

## 2010-12-24 ENCOUNTER — Other Ambulatory Visit (HOSPITAL_COMMUNITY): Payer: Self-pay | Admitting: Urology

## 2010-12-24 DIAGNOSIS — R310 Gross hematuria: Secondary | ICD-10-CM

## 2010-12-28 ENCOUNTER — Ambulatory Visit (HOSPITAL_COMMUNITY): Payer: Medicare Other

## 2011-01-21 ENCOUNTER — Ambulatory Visit (INDEPENDENT_AMBULATORY_CARE_PROVIDER_SITE_OTHER): Payer: Medicare Other | Admitting: Urology

## 2011-01-21 ENCOUNTER — Other Ambulatory Visit: Payer: Self-pay | Admitting: Urology

## 2011-01-21 DIAGNOSIS — R310 Gross hematuria: Secondary | ICD-10-CM

## 2011-01-21 DIAGNOSIS — Z87440 Personal history of urinary (tract) infections: Secondary | ICD-10-CM

## 2011-01-24 ENCOUNTER — Inpatient Hospital Stay (HOSPITAL_COMMUNITY)
Admission: RE | Admit: 2011-01-24 | Discharge: 2011-01-24 | Payer: Medicare Other | Source: Ambulatory Visit | Attending: Urology | Admitting: Urology

## 2011-01-27 ENCOUNTER — Encounter: Payer: Self-pay | Admitting: Internal Medicine

## 2011-01-27 ENCOUNTER — Ambulatory Visit (INDEPENDENT_AMBULATORY_CARE_PROVIDER_SITE_OTHER): Payer: Medicare Other | Admitting: Orthopedic Surgery

## 2011-01-27 DIAGNOSIS — M5430 Sciatica, unspecified side: Secondary | ICD-10-CM

## 2011-01-27 DIAGNOSIS — S72009A Fracture of unspecified part of neck of unspecified femur, initial encounter for closed fracture: Secondary | ICD-10-CM

## 2011-01-27 DIAGNOSIS — IMO0002 Reserved for concepts with insufficient information to code with codable children: Secondary | ICD-10-CM

## 2011-01-27 MED ORDER — PREDNISONE 5 MG PO KIT: 5.0000 mg | PACK | ORAL | Status: DC

## 2011-01-27 NOTE — Progress Notes (Signed)
Postop status post hardware removal cannulated screws, and RIGHT total hip replacement for her hardware failure, and nonunion. March 2012  Doing well, although not as fast as he would like. Ambulating with a walker. Complains of some LEFT hip, thigh, and gluteal pain, with a history of fracture of lumbar spine. Several years ago.  Appears to be having radicular symptoms in his LEFT hip and we recommended a steroid Dosepak.

## 2011-02-03 ENCOUNTER — Ambulatory Visit (HOSPITAL_COMMUNITY)
Admission: RE | Admit: 2011-02-03 | Discharge: 2011-02-03 | Disposition: A | Discharge status: Home / Self Care | Payer: Medicare Other | Source: Ambulatory Visit | Attending: Urology | Admitting: Urology

## 2011-02-03 DIAGNOSIS — N210 Calculus in bladder: Secondary | ICD-10-CM | POA: Insufficient documentation

## 2011-02-03 DIAGNOSIS — R300 Dysuria: Secondary | ICD-10-CM | POA: Insufficient documentation

## 2011-02-03 DIAGNOSIS — K8020 Calculus of gallbladder without cholecystitis without obstruction: Secondary | ICD-10-CM | POA: Insufficient documentation

## 2011-02-03 DIAGNOSIS — M899 Disorder of bone, unspecified: Secondary | ICD-10-CM | POA: Insufficient documentation

## 2011-02-03 DIAGNOSIS — R310 Gross hematuria: Secondary | ICD-10-CM | POA: Insufficient documentation

## 2011-02-03 DIAGNOSIS — M949 Disorder of cartilage, unspecified: Secondary | ICD-10-CM | POA: Insufficient documentation

## 2011-02-03 LAB — CREATININE, SERUM
GFR calc Af Amer: >60 mL/min (ref >60)
GFR calc non Af Amer: >60 mL/min (ref >60)

## 2011-02-03 MED ORDER — IOHEXOL 300 MG/ML  SOLN
125.0000 mL | Freq: Once | INTRAMUSCULAR | Status: AC | PRN
  Administered 2011-02-03: 125 mL via INTRAVENOUS

## 2011-02-18 ENCOUNTER — Ambulatory Visit (INDEPENDENT_AMBULATORY_CARE_PROVIDER_SITE_OTHER): Payer: Medicare Other | Admitting: Urology

## 2011-02-18 DIAGNOSIS — N210 Calculus in bladder: Secondary | ICD-10-CM

## 2011-02-18 DIAGNOSIS — N401 Enlarged prostate with lower urinary tract symptoms: Secondary | ICD-10-CM

## 2011-02-18 DIAGNOSIS — N323 Diverticulum of bladder: Secondary | ICD-10-CM

## 2011-02-18 DIAGNOSIS — N329 Bladder disorder, unspecified: Secondary | ICD-10-CM

## 2011-02-21 ENCOUNTER — Emergency Department (HOSPITAL_COMMUNITY)
Admission: EM | Admit: 2011-02-21 | Discharge: 2011-02-21 | Disposition: A | Discharge status: Home / Self Care | Payer: Medicare Other | Attending: Emergency Medicine | Admitting: Emergency Medicine

## 2011-02-21 ENCOUNTER — Encounter (HOSPITAL_COMMUNITY): Payer: Self-pay | Admitting: Emergency Medicine

## 2011-02-21 DIAGNOSIS — N390 Urinary tract infection, site not specified: Secondary | ICD-10-CM | POA: Insufficient documentation

## 2011-02-21 DIAGNOSIS — I10 Essential (primary) hypertension: Secondary | ICD-10-CM | POA: Insufficient documentation

## 2011-02-21 DIAGNOSIS — F1010 Alcohol abuse, uncomplicated: Secondary | ICD-10-CM | POA: Insufficient documentation

## 2011-02-21 DIAGNOSIS — J4489 Other specified chronic obstructive pulmonary disease: Secondary | ICD-10-CM | POA: Insufficient documentation

## 2011-02-21 DIAGNOSIS — I4891 Unspecified atrial fibrillation: Secondary | ICD-10-CM | POA: Insufficient documentation

## 2011-02-21 DIAGNOSIS — J449 Chronic obstructive pulmonary disease, unspecified: Secondary | ICD-10-CM | POA: Insufficient documentation

## 2011-02-21 LAB — URINALYSIS, ROUTINE W REFLEX MICROSCOPIC
Nitrite: POSITIVE — AB
Protein, ur: 100 mg/dL — AB
Specific Gravity, Urine: 1.02 (ref 1.005–1.030)
Urobilinogen, UA: 1 mg/dL (ref 0.0–1.0)
Urobilinogen, UA: 0.2 mg/dL (ref 0.0–1.0)

## 2011-02-21 LAB — BASIC METABOLIC PANEL
CO2: 28 mEq/L (ref 19–32)
Chloride: 104 mEq/L (ref 96–112)
Glucose, Bld: 138 mg/dL — ABNORMAL HIGH (ref 70–99)
Potassium: 4.6 mEq/L (ref 3.5–5.1)
Sodium: 139 mEq/L (ref 135–145)

## 2011-02-21 LAB — DIFFERENTIAL
Basophils Absolute: 0 10*3/uL (ref 0.0–0.1)
Eosinophils Relative: 2 % (ref 0–5)
Lymphocytes Relative: 9 % — ABNORMAL LOW (ref 12–46)
Lymphs Abs: 0.7 10*3/uL (ref 0.7–4.0)
Neutro Abs: 6.9 10*3/uL (ref 1.7–7.7)
Neutrophils Relative %: 84 % — ABNORMAL HIGH (ref 43–77)

## 2011-02-21 LAB — URINE MICROSCOPIC-ADD ON

## 2011-02-21 LAB — CBC
MCV: 93.5 fL (ref 78.0–100.0)
Platelets: 147 10*3/uL — ABNORMAL LOW (ref 150–400)
RBC: 3.82 MIL/uL — ABNORMAL LOW (ref 4.22–5.81)
WBC: 8.2 10*3/uL (ref 4.0–10.5)

## 2011-02-21 MED ORDER — LEVOFLOXACIN 500 MG PO TABS: 500.0000 mg | ORAL_TABLET | Freq: Every day | ORAL | Status: AC

## 2011-02-21 MED ORDER — LEVOFLOXACIN IN D5W 750 MG/150ML IV SOLN
750.0000 mg | Freq: Once | INTRAVENOUS | Status: AC
  Administered 2011-02-21: 750 mg via INTRAVENOUS
  Filled 2011-02-21: qty 150

## 2011-02-21 NOTE — ED Notes (Signed)
Pt states he had a cystoscopy done Friday and Saturday pt c/o burning while urinated and the wife states the urine has a foul smell.

## 2011-02-21 NOTE — ED Notes (Signed)
Pt left er stating no needs 

## 2011-02-21 NOTE — ED Notes (Signed)
Pt reports catheter is causing pain and requests it be removed.  Dr Rosalia Hammers notified.  Instructed to remove catheter as drainage was minimal.

## 2011-02-21 NOTE — ED Provider Notes (Signed)
History     CSN: [id number] Arrival date & time: 02/21/2011  5:38 PM  Chief Complaint  Patient presents with  . Dysuria   Patient is a 77 y.o. male presenting with dysuria. The history is provided by the patient and the spouse.  Dysuria  This is a new problem. The current episode started 12 to 24 hours ago. The problem occurs every urination. The problem has been rapidly worsening. The quality of the pain is described as burning. The pain is at a severity of 5/10. There has been no fever. Associated symptoms include frequency, hematuria and urgency. Pertinent negatives include no chills, no sweats, no nausea, no vomiting and no discharge. He has tried nothing for the symptoms.    Patient states cystoscopy on Friday for hematuria now with painful urination and frequency of urination.  Patient thinks his abdomen may me slightly distended.    Past Medical History  Diagnosis Date  . Hypertension   . Alcohol abuse     stop drinking since 01/2009  . B12 deficiency 9/10    started injection   . Atrial fibrillation     off coumadin secondary to fall and subdural  hematoma   . COPD (chronic obstructive pulmonary disease)   . Compression fracture   . Venous stasis     chronic   . BPH (benign prostatic hyperplasia)   . Arthritis   . Nutcracker esophagus 2002    on EM  . Diverticula of colon     few small scattered in the sigmoid colon  . External hemorrhoids 09/2014    History reviewed. No pertinent past surgical history.  Family History  Problem Relation Age of Onset  . Cancer Brother     History  Substance Use Topics  . Smoking status: Never Smoker   . Smokeless tobacco: Former Neurosurgeon    Types: Chew  . Alcohol Use: No     history of alcohol abuse stop drinking 8/10 . Alcohol abuse for about 25 years       Review of Systems  Unable to perform ROS Constitutional: Negative for chills.  Gastrointestinal: Negative for nausea and vomiting.  Genitourinary: Positive for dysuria,  urgency, frequency and hematuria.  All other systems reviewed and are negative.    Physical Exam  BP 110/54  Pulse 78  Temp(Src) 98.6 F (37 C) (Oral)  Resp 16  Ht 6' (1.829 m)  Wt 185 lb (83.915 kg)  BMI 25.09 kg/m2  SpO2 95%  Physical Exam  ED Course  Procedures  MDM Results for orders placed during the hospital encounter of 02/21/11  URINALYSIS, ROUTINE W REFLEX MICROSCOPIC      Component Value Range   Color, Urine BROWN (*) YELLOW    Appearance CLOUDY (*) CLEAR    Specific Gravity, Urine 1.020  1.005 - 1.030    pH 7.5  5.0 - 8.0    Glucose, UA NEGATIVE  NEGATIVE (mg/dL)   Hgb urine dipstick LARGE (*) NEGATIVE    Bilirubin Urine SMALL (*) NEGATIVE    Ketones, ur NEGATIVE  NEGATIVE (mg/dL)   Protein, ur 409 (*) NEGATIVE (mg/dL)   Urobilinogen, UA 1.0  0.0 - 1.0 (mg/dL)   Nitrite POSITIVE (*) NEGATIVE    Leukocytes, UA MODERATE (*) NEGATIVE   URINALYSIS, ROUTINE W REFLEX MICROSCOPIC      Component Value Range   Color, Urine BROWN (*) YELLOW    Appearance CLOUDY (*) CLEAR    Specific Gravity, Urine 1.020  1.005 - 1.030  pH 8.0  5.0 - 8.0    Glucose, UA NEGATIVE  NEGATIVE (mg/dL)   Hgb urine dipstick LARGE (*) NEGATIVE    Bilirubin Urine NEGATIVE  NEGATIVE    Ketones, ur NEGATIVE  NEGATIVE (mg/dL)   Protein, ur 865 (*) NEGATIVE (mg/dL)   Urobilinogen, UA 0.2  0.0 - 1.0 (mg/dL)   Nitrite POSITIVE (*) NEGATIVE    Leukocytes, UA MODERATE (*) NEGATIVE   CBC      Component Value Range   WBC 8.2  4.0 - 10.5 (K/uL)   RBC 3.82 (*) 4.22 - 5.81 (MIL/uL)   Hemoglobin 11.7 (*) 13.0 - 17.0 (g/dL)   HCT 78.4 (*) 69.6 - 52.0 (%)   MCV 93.5  78.0 - 100.0 (fL)   MCH 30.6  26.0 - 34.0 (pg)   MCHC 32.8  30.0 - 36.0 (g/dL)   RDW 29.5  28.4 - 13.2 (%)   Platelets 147 (*) 150 - 400 (K/uL)  DIFFERENTIAL      Component Value Range   Neutrophils Relative 84 (*) 43 - 77 (%)   Neutro Abs 6.9  1.7 - 7.7 (K/uL)   Lymphocytes Relative 9 (*) 12 - 46 (%)   Lymphs Abs 0.7  0.7 -  4.0 (K/uL)   Monocytes Relative 5  3 - 12 (%)   Monocytes Absolute 0.4  0.1 - 1.0 (K/uL)   Eosinophils Relative 2  0 - 5 (%)   Eosinophils Absolute 0.2  0.0 - 0.7 (K/uL)   Basophils Relative 0  0 - 1 (%)   Basophils Absolute 0.0  0.0 - 0.1 (K/uL)  BASIC METABOLIC PANEL      Component Value Range   Sodium 139  135 - 145 (mEq/L)   Potassium 4.6  3.5 - 5.1 (mEq/L)   Chloride 104  96 - 112 (mEq/L)   CO2 28  19 - 32 (mEq/L)   Glucose, Bld 138 (*) 70 - 99 (mg/dL)   BUN 28 (*) 6 - 23 (mg/dL)   Creatinine, Ser 4.40  0.50 - 1.35 (mg/dL)   Calcium 8.8  8.4 - 10.2 (mg/dL)   GFR calc non Af Amer >60  >60 (mL/min)   GFR calc Af Amer >60  >60 (mL/min)  URINE MICROSCOPIC-ADD ON      Component Value Range   WBC, UA TOO NUMEROUS TO COUNT  <3 (WBC/hpf)   RBC / HPF TOO NUMEROUS TO COUNT  <3 (RBC/hpf)   Bacteria, UA MANY (*) RARE   URINE MICROSCOPIC-ADD ON      Component Value Range   WBC, UA 21-50  <3 (WBC/hpf)   RBC / HPF 21-50  <3 (RBC/hpf)   Bacteria, UA MANY (*) RARE    Patient given levaquin here and urine cultured.  Foley placed without evidence of retention and d/c'd.  Patient advised follow up with urologist.    Hilario Quarry, MD 02/21/11 2015

## 2011-02-23 LAB — URINE CULTURE

## 2011-02-24 NOTE — ED Notes (Signed)
+  urine Chart sent to EDP of[facility]ce for review.

## 2011-02-25 ENCOUNTER — Ambulatory Visit (INDEPENDENT_AMBULATORY_CARE_PROVIDER_SITE_OTHER): Payer: Medicare Other | Admitting: Urology

## 2011-02-25 DIAGNOSIS — N390 Urinary tract infection, site not specified: Secondary | ICD-10-CM

## 2011-02-25 DIAGNOSIS — R310 Gross hematuria: Secondary | ICD-10-CM

## 2011-02-25 DIAGNOSIS — N401 Enlarged prostate with lower urinary tract symptoms: Secondary | ICD-10-CM

## 2011-02-28 NOTE — ED Notes (Signed)
Chart reviewed by Trixie Dredge PA "If pt has not seen urologist yet, give Keflex 500 mg po BID x days.  Please follow up with Urologist"  02/28/11 Spoke w/pt and he has followed up/Urology.

## 2011-03-02 ENCOUNTER — Ambulatory Visit (HOSPITAL_COMMUNITY)
Admission: RE | Admit: 2011-03-02 | Discharge: 2011-03-02 | Disposition: A | Discharge status: Home / Self Care | Payer: Medicare Other | Source: Ambulatory Visit | Attending: Urology | Admitting: Urology

## 2011-03-02 ENCOUNTER — Encounter (HOSPITAL_COMMUNITY): Payer: Medicare Other

## 2011-03-02 ENCOUNTER — Other Ambulatory Visit: Payer: Self-pay | Admitting: Urology

## 2011-03-02 DIAGNOSIS — N329 Bladder disorder, unspecified: Secondary | ICD-10-CM

## 2011-03-02 LAB — BASIC METABOLIC PANEL
Calcium: 9.3 mg/dL (ref 8.4–10.5)
GFR calc Af Amer: >60 mL/min (ref >60)
GFR calc non Af Amer: >60 mL/min (ref >60)
Glucose, Bld: 100 mg/dL — ABNORMAL HIGH (ref 70–99)
Potassium: 4.4 mEq/L (ref 3.5–5.1)
Sodium: 137 mEq/L (ref 135–145)

## 2011-03-02 LAB — CBC
MCH: 29.4 pg (ref 26.0–34.0)
MCHC: 31.9 g/dL (ref 30.0–36.0)
RDW: 14.2 % (ref 11.5–15.5)

## 2011-03-02 LAB — SURGICAL PCR SCREEN: Staphylococcus aureus: NEGATIVE

## 2011-03-03 ENCOUNTER — Ambulatory Visit (HOSPITAL_COMMUNITY)
Admission: RE | Admit: 2011-03-03 | Discharge: 2011-03-04 | Disposition: A | Discharge status: Home / Self Care | Payer: Medicare Other | Source: Ambulatory Visit | Attending: Urology | Admitting: Urology

## 2011-03-03 ENCOUNTER — Other Ambulatory Visit: Payer: Self-pay | Admitting: Urology

## 2011-03-03 DIAGNOSIS — J4489 Other specified chronic obstructive pulmonary disease: Secondary | ICD-10-CM | POA: Insufficient documentation

## 2011-03-03 DIAGNOSIS — I10 Essential (primary) hypertension: Secondary | ICD-10-CM | POA: Insufficient documentation

## 2011-03-03 DIAGNOSIS — J8410 Pulmonary fibrosis, unspecified: Secondary | ICD-10-CM | POA: Insufficient documentation

## 2011-03-03 DIAGNOSIS — N210 Calculus in bladder: Secondary | ICD-10-CM | POA: Insufficient documentation

## 2011-03-03 DIAGNOSIS — Z01812 Encounter for preprocedural laboratory examination: Secondary | ICD-10-CM | POA: Insufficient documentation

## 2011-03-03 DIAGNOSIS — Z01818 Encounter for other preprocedural examination: Secondary | ICD-10-CM | POA: Insufficient documentation

## 2011-03-03 DIAGNOSIS — J449 Chronic obstructive pulmonary disease, unspecified: Secondary | ICD-10-CM | POA: Insufficient documentation

## 2011-03-03 DIAGNOSIS — I4891 Unspecified atrial fibrillation: Secondary | ICD-10-CM | POA: Insufficient documentation

## 2011-03-03 DIAGNOSIS — N138 Other obstructive and reflux uropathy: Secondary | ICD-10-CM | POA: Insufficient documentation

## 2011-03-03 DIAGNOSIS — N3289 Other specified disorders of bladder: Secondary | ICD-10-CM | POA: Insufficient documentation

## 2011-03-03 DIAGNOSIS — N320 Bladder-neck obstruction: Secondary | ICD-10-CM | POA: Insufficient documentation

## 2011-03-03 DIAGNOSIS — N289 Disorder of kidney and ureter, unspecified: Secondary | ICD-10-CM | POA: Insufficient documentation

## 2011-03-03 DIAGNOSIS — I517 Cardiomegaly: Secondary | ICD-10-CM | POA: Insufficient documentation

## 2011-03-03 DIAGNOSIS — N401 Enlarged prostate with lower urinary tract symptoms: Secondary | ICD-10-CM | POA: Insufficient documentation

## 2011-03-03 DIAGNOSIS — Z79899 Other long term (current) drug therapy: Secondary | ICD-10-CM | POA: Insufficient documentation

## 2011-03-10 NOTE — Op Note (Signed)
NAME:  [name] [name], [name] [name]                    ACCOUNT NO.:  [id number]  MEDICAL RECORD NO.:  [id number]  LOCATION:  1420                         FACILITY:  [facility]  PHYSICIAN:  Excell Seltzer. Annabell Howells, M.D.    DATE OF BIRTH:  1932/11/07  DATE OF PROCEDURE: DATE OF DISCHARGE:                              OPERATIVE REPORT   PROCEDURE:  Cystoscopy, evacuation of old clot, transurethral electrovaporization of prostate, bladder wash cytology, and fulguration of bladder lesion.  PREOPERATIVE DIAGNOSES:  Bladder stone and lesion, benign prostatic hyperplasia with bladder outlet obstruction.  POSTOPERATIVE DIAGNOSES:  Old clot in the bladder with a bladder lesion, along with benign prostatic hyperplasia and bladder outlet obstruction.  SURGEON:  Excell Seltzer. Annabell Howells, MD  ANESTHESIA:  General.  SPECIMEN:  Bladder wash.  DRAINS:  20-French Foley catheter.  COMPLICATIONS:  None.  INDICATIONS:  [name] [name] is a 77 year old white male with benign prostatic hyperplasia, bladder outlet obstruction, and history of hematuria with UTIs.  Office cystoscopy demonstrated an old clot/stone and a right bladder wall diverticulum with an erythematous lesion that was felt to require biopsy, also had BPH and bladder outlet obstruction.  It was felt that TUR was indicated as well.  FINDINGS OF PROCEDURE:  He had been on ampicillin preoperatively and was given Rocephin in the operating room.  A general anesthetic was induced. He was placed in lithotomy position after PAS hose were placed.  His perineum and genitalia were prepped and draped with Betadine solution. He was draped in the usual sterile fashion.  Time-out was performed.  Cystoscopy was performed using a 22-French scope and 12-degree lens. Examination revealed a normal urethra.  The external sphincter was intact.  The prostatic urethra was about 2 cm in length with a high bladder neck and lateral lobe enlargement with obstruction.  Examination of the bladder revealed  moderate to severe trabeculation with multiple diverticula and cellules.  There was a large diverticula on the right anterolateral wall, and upon inspection in this diverticulum, an old gray ovoid clot was identified that had already began to fragment. Erythema was noted on the inner wall of the diverticulum that appeared to be inflammatory.  The old clot was broken up just with the tip of the scope and evacuated without difficulty.  At this point, a 28-French continuous flow resectoscope sheath was inserted and fitted with an [place] handle gyrus button and a 12-degree lens.  Saline was used through the irrigant along with the Gyrus generator.  The prostate was then resected/vaporized with the button electrode. Initially the bladder neck fibers were defined from 5 to 7 o'clock and the floor of the prostate was vaporized out to alongside the verumontanum.  The right lobe was vaporized from bladder neck to apex out to capsular fibers.  This was followed by the left lobe, followed by some residual apical and anterior tissue.  Once an adequate channel had been created, the vaporization bed was fulgurated with the cautery setting.  Multiple small prostatic stones were liberated during the procedure. These were evacuated from the bladder after completion of the TUEVP. The cystoscope was reinserted with a 12-degree lens and biopsy forceps and attempt was  made to biopsy the lesion in the right diverticulum. However, the tissues were stiff since it appeared to be inflammatory and I was unable to get an adequate biopsy.  At this point, washings were obtained from the diverticulum and sent to pathology for cytology.  The resectoscope sheath was reinserted and the Gyrus button was used to fulgurate the erythematous area in the diverticulum.  Final inspection revealed no other lesions in the bladder that were of concern.  There were some hemorrhagic changes just from scope trauma.  Ureteral  orifices were well away from the resection.  No residual stones or tissue fragments were noted.  The bladder was then left full and the scope was removed.  Pressure produced an excellent stream.  A 20-French Foley catheter was inserted.  Balloon was filled with 30 mL sterile fluid. The patient was then taken down from lithotomy position.  His anesthetic was reversed.  He was moved to the recovery room in stable condition. There were no complications.     Excell Seltzer. Annabell Howells, M.D.     JJW/MEDQ  D:  03/03/2011  T:  03/03/2011  Job:  [id number]  cc:   Lorin Picket A. Gerda Diss, MD Fax: [phone]  Electronically Signed by Bjorn Pippin M.D. on 03/10/2011 09:43:25 AM

## 2011-03-25 ENCOUNTER — Ambulatory Visit (INDEPENDENT_AMBULATORY_CARE_PROVIDER_SITE_OTHER): Payer: Medicare Other | Admitting: Urology

## 2011-03-25 DIAGNOSIS — D090 Carcinoma in situ of bladder: Secondary | ICD-10-CM

## 2011-03-25 DIAGNOSIS — N3020 Other chronic cystitis without hematuria: Secondary | ICD-10-CM

## 2011-04-07 LAB — POCT I-STAT 3, VENOUS BLOOD GAS (G3P V)
Acid-Base Excess: 1
Bicarbonate: 23.8
O2 Saturation: 68
O2 Saturation: 97
Operator id: 221371
TCO2: 25
pCO2, Ven: 37.2 — ABNORMAL LOW
pH, Ven: 7.422 — ABNORMAL HIGH
pO2, Ven: 79 — ABNORMAL HIGH

## 2011-04-15 ENCOUNTER — Ambulatory Visit (INDEPENDENT_AMBULATORY_CARE_PROVIDER_SITE_OTHER): Payer: Medicare Other | Admitting: Urology

## 2011-04-15 DIAGNOSIS — D090 Carcinoma in situ of bladder: Secondary | ICD-10-CM

## 2011-04-22 ENCOUNTER — Ambulatory Visit (INDEPENDENT_AMBULATORY_CARE_PROVIDER_SITE_OTHER): Payer: Medicare Other | Admitting: Urology

## 2011-04-22 DIAGNOSIS — D090 Carcinoma in situ of bladder: Secondary | ICD-10-CM

## 2011-04-26 ENCOUNTER — Ambulatory Visit: Payer: Medicare Other | Admitting: Urology

## 2011-05-03 ENCOUNTER — Encounter: Payer: Self-pay | Admitting: Orthopedic Surgery

## 2011-05-03 ENCOUNTER — Encounter: Payer: Medicare Other | Admitting: Orthopedic Surgery

## 2011-05-03 VITALS — BP 102/60 | Ht 72.0 in | Wt 204.0 lb

## 2011-05-03 DIAGNOSIS — M4800 Spinal stenosis, site unspecified: Secondary | ICD-10-CM

## 2011-05-03 DIAGNOSIS — M48061 Spinal stenosis, lumbar region without neurogenic claudication: Secondary | ICD-10-CM | POA: Insufficient documentation

## 2011-05-03 NOTE — Progress Notes (Signed)
Follow-up visit  March 2000 a total hip after failed internal fixation did well.  Continues to do well.  Complains of back pain difficulty standing for any significant period of time.  He still requires his walker and leans forward with a grocery cart sign has anterior thigh pain bilateral  Hip flexion normal.  Leg lengths are nearly equal.  He has a knee flexion contracture which gives him a slight leg length discrepancy.  Recommend MRI to evaluate spinal stenosis for possible injection.  He has failed treatment with hydrocodone and gabapentin.

## 2011-05-03 NOTE — Patient Instructions (Signed)
MRI L SPINE FOR SPINAL STENOSIS

## 2011-05-06 ENCOUNTER — Ambulatory Visit (INDEPENDENT_AMBULATORY_CARE_PROVIDER_SITE_OTHER): Payer: Medicare Other | Admitting: Urology

## 2011-05-06 DIAGNOSIS — D090 Carcinoma in situ of bladder: Secondary | ICD-10-CM

## 2011-05-06 DIAGNOSIS — N3020 Other chronic cystitis without hematuria: Secondary | ICD-10-CM

## 2011-05-09 ENCOUNTER — Telehealth: Payer: Self-pay | Admitting: Radiology

## 2011-05-09 NOTE — Telephone Encounter (Signed)
I called to give the patient his MRI appointment at [facility] on 05-11-11 at 10:45 am. Patient has Medicare, no precert is needed. Patient will follow up back here in our office to go over his results.

## 2011-05-11 ENCOUNTER — Ambulatory Visit (HOSPITAL_COMMUNITY)
Admission: RE | Admit: 2011-05-11 | Discharge: 2011-05-11 | Disposition: A | Discharge status: Home / Self Care | Payer: Medicare Other | Source: Ambulatory Visit | Attending: Orthopedic Surgery | Admitting: Orthopedic Surgery

## 2011-05-11 DIAGNOSIS — M51379 Other intervertebral disc degeneration, lumbosacral region without mention of lumbar back pain or lower extremity pain: Secondary | ICD-10-CM | POA: Insufficient documentation

## 2011-05-11 DIAGNOSIS — M5124 Other intervertebral disc displacement, thoracic region: Secondary | ICD-10-CM | POA: Insufficient documentation

## 2011-05-11 DIAGNOSIS — M545 Low back pain, unspecified: Secondary | ICD-10-CM | POA: Insufficient documentation

## 2011-05-11 DIAGNOSIS — M5137 Other intervertebral disc degeneration, lumbosacral region: Secondary | ICD-10-CM | POA: Insufficient documentation

## 2011-05-11 DIAGNOSIS — M5126 Other intervertebral disc displacement, lumbar region: Secondary | ICD-10-CM | POA: Insufficient documentation

## 2011-05-11 DIAGNOSIS — M48061 Spinal stenosis, lumbar region without neurogenic claudication: Secondary | ICD-10-CM

## 2011-05-13 ENCOUNTER — Ambulatory Visit (INDEPENDENT_AMBULATORY_CARE_PROVIDER_SITE_OTHER): Payer: Medicare Other | Admitting: Urology

## 2011-05-13 DIAGNOSIS — D090 Carcinoma in situ of bladder: Secondary | ICD-10-CM

## 2011-05-24 ENCOUNTER — Encounter: Payer: Self-pay | Admitting: Orthopedic Surgery

## 2011-05-24 ENCOUNTER — Ambulatory Visit (INDEPENDENT_AMBULATORY_CARE_PROVIDER_SITE_OTHER): Payer: Medicare Other | Admitting: Orthopedic Surgery

## 2011-05-24 VITALS — BP 100/60 | Ht 72.0 in | Wt 203.0 lb

## 2011-05-24 DIAGNOSIS — M4800 Spinal stenosis, site unspecified: Secondary | ICD-10-CM

## 2011-05-24 NOTE — Patient Instructions (Addendum)
Refer: to neurosurgery   IMPRESSION:  1. Multilevel compression fractures in the lumbar spine and lower  thoracic spine, potentially with mild subacute components along the  superior endplate of L3 and centrally at the L4 vertebral level.  2. A suspected nondisplaced transverse sacral fracture of the S2-3  level.  3. Lumbar spondylosis and degenerative disc disease, with left  foraminal stenosis at L5-S1. At L3-4, spurring displaces the right  L3 nerve in the lateral extraforaminal space.

## 2011-05-24 NOTE — Progress Notes (Signed)
RIGHT total hip arthroplasty and hardware removal of the RIGHT hip. 3.5.2012  After MRI of his spine and he has definite lumbar disc disease, spinal stenosis, displaced nerve root, and several other bony abnormalities. Recommend followup with the neurosurgeon to address spinal issues, including LEFT leg radiculopathy.  MRI was reviewed with report

## 2011-05-30 ENCOUNTER — Telehealth: Payer: Self-pay | Admitting: Radiology

## 2011-05-30 NOTE — Telephone Encounter (Signed)
I faxed a referral for this patient to [facility] to be seen for DDD of l-spine.

## 2011-06-08 ENCOUNTER — Telehealth: Payer: Self-pay | Admitting: Orthopedic Surgery

## 2011-06-08 NOTE — Telephone Encounter (Signed)
Patient's wife called to relay that patient has been scheduled for the referral appointment at [facility] -- appointment is 06/13/11, 9:30am with Dr. Venetia Maxon.  Patient will contact [facility] for MRI film to take to the appointment.

## 2011-06-14 ENCOUNTER — Other Ambulatory Visit: Payer: Self-pay | Admitting: Neurosurgery

## 2011-06-24 ENCOUNTER — Encounter (HOSPITAL_COMMUNITY): Payer: Self-pay | Admitting: Pharmacy Technician

## 2011-06-24 ENCOUNTER — Ambulatory Visit (INDEPENDENT_AMBULATORY_CARE_PROVIDER_SITE_OTHER): Payer: Medicare Other | Admitting: Urology

## 2011-06-24 DIAGNOSIS — N320 Bladder-neck obstruction: Secondary | ICD-10-CM

## 2011-06-24 DIAGNOSIS — D090 Carcinoma in situ of bladder: Secondary | ICD-10-CM

## 2011-06-24 DIAGNOSIS — N3020 Other chronic cystitis without hematuria: Secondary | ICD-10-CM

## 2011-07-05 ENCOUNTER — Other Ambulatory Visit: Payer: Self-pay

## 2011-07-05 ENCOUNTER — Encounter (HOSPITAL_COMMUNITY): Payer: Self-pay

## 2011-07-05 ENCOUNTER — Encounter (HOSPITAL_COMMUNITY)
Admission: RE | Admit: 2011-07-05 | Discharge: 2011-07-05 | Disposition: A | Discharge status: Home / Self Care | Payer: Medicare Other | Source: Ambulatory Visit | Attending: Neurosurgery | Admitting: Neurosurgery

## 2011-07-05 DIAGNOSIS — M810 Age-related osteoporosis without current pathological fracture: Secondary | ICD-10-CM | POA: Diagnosis not present

## 2011-07-05 DIAGNOSIS — Z01812 Encounter for preprocedural laboratory examination: Secondary | ICD-10-CM | POA: Diagnosis not present

## 2011-07-05 DIAGNOSIS — I10 Essential (primary) hypertension: Secondary | ICD-10-CM | POA: Diagnosis not present

## 2011-07-05 DIAGNOSIS — M8448XA Pathological fracture, other site, initial encounter for fracture: Secondary | ICD-10-CM | POA: Diagnosis not present

## 2011-07-05 DIAGNOSIS — Z01810 Encounter for preprocedural cardiovascular examination: Secondary | ICD-10-CM | POA: Diagnosis not present

## 2011-07-05 DIAGNOSIS — J449 Chronic obstructive pulmonary disease, unspecified: Secondary | ICD-10-CM | POA: Diagnosis not present

## 2011-07-05 DIAGNOSIS — K219 Gastro-esophageal reflux disease without esophagitis: Secondary | ICD-10-CM | POA: Diagnosis not present

## 2011-07-05 HISTORY — DX: Chronic kidney disease, unspecified: N18.9

## 2011-07-05 HISTORY — DX: Gastro-esophageal reflux disease without esophagitis: K21.9

## 2011-07-05 HISTORY — DX: Personal history of other diseases of the digestive system: Z87.19

## 2011-07-05 HISTORY — DX: Headache: R51

## 2011-07-05 LAB — COMPREHENSIVE METABOLIC PANEL
BUN: 22 mg/dL (ref 6–23)
CO2: 28 mEq/L (ref 19–32)
Chloride: 105 mEq/L (ref 96–112)
Creatinine, Ser: 0.83 mg/dL (ref 0.50–1.35)
GFR calc Af Amer: >90 mL/min (ref >90)
GFR calc non Af Amer: 83 mL/min — ABNORMAL LOW (ref >90)
Glucose, Bld: 99 mg/dL (ref 70–99)
Total Bilirubin: 0.2 mg/dL — ABNORMAL LOW (ref 0.3–1.2)

## 2011-07-05 LAB — SURGICAL PCR SCREEN: MRSA, PCR: NEGATIVE

## 2011-07-05 LAB — CBC
MCH: 29.5 pg (ref 26.0–34.0)
Platelets: 188 10*3/uL (ref 150–400)
RBC: 4.64 MIL/uL (ref 4.22–5.81)
WBC: 7.7 10*3/uL (ref 4.0–10.5)

## 2011-07-05 NOTE — Progress Notes (Signed)
PATIENT STATES IT HAS BEEN OVER OVER 3 YEARS SINCE HEART CATH, BUT WAS DONE AT [facility]. PATIENT DOES NOT HAVE CARDIOLOGIST, ONLY SEES  DR Lubertha South IN [place].

## 2011-07-05 NOTE — Pre-Procedure Instructions (Signed)
20 [name] [name]  07/05/2011   Your procedure is scheduled on:  07/12/11  Report to [facility] at 530 AM.  Call this number if you have problems the morning of surgery: [phone]   Remember:   Do not eat food:After Midnight.  May have clear liquids: up to 4 Hours before arrival.  Clear liquids include soda, tea, black coffee, apple or grape juice, broth.  Take these medicines the morning of surgery with A SIP OF WATER: celexa, neurontin, prilosec   STOP anaprox, asa any herbal meds, blood thinners   Do not wear jewelry, make-up or nail polish.  Do not wear lotions, powders, or perfumes. You may wear deodorant.  Do not shave 48 hours prior to surgery.  Do not bring valuables to the hospital.  Contacts, dentures or bridgework may not be worn into surgery.  Leave suitcase in the car. After surgery it may be brought to your room.  For patients admitted to the hospital, checkout time is 11:00 AM the day of discharge.   Patients discharged the day of surgery will not be allowed to drive home.  Name and phone number of your driver:   Special Instructions: CHG Shower Use Special Wash: 1/2 bottle night before surgery and 1/2 bottle morning of surgery.   Please read over the following fact sheets that you were given: Pain Booklet, Coughing and Deep Breathing, MRSA Information and Surgical Site Infection Prevention

## 2011-07-05 NOTE — Consult Note (Signed)
Anesthesia:  Patient is a 78 year old male scheduled for a L 3-4, possible L5 kyphoplasty on 07/12/11.  His history is significant for HTN, former ETOH abuse, COPD, BPH, UTI, GERD, headaches, HH, afib (off Coumadin secondary to SDH), former smoker, and bladder CA.  Labs are acceptable for OR.  CXR from 03/02/11 showed COPD. Basilar fibrosis. Stable cardiomegaly.  He has known afib dating back to at least 2004.  He has seen Dr. Eden Emms in the past, but not since 2008.  He is now only followed by his PCP Dr. Lilyan Punt.    His last echo was on 01/19/07 and showed mild-mod left and right atrial enlargement, mild RV dilatation, no significant RV hypertrophy, normal RV systolic function, mild sclerosis of AV, trace MR, normal LV regional and global function, mild LVH.  A cath was done on 04/10/07 and showed no significant pulmonary HTN, good LV function, EF 60%, and no significant coronary disease.    His EKG today showed afib, bradycardia, incomplete right BBB, anterior and inferior T wave abnormality.  The T wave abnormality is more apparent since his last EKG on 08/26/10, but was present on EKG from 12/17/09.    Of note, he tolerated ORIF of a right hip fx on 12/19/09 and removal of hardware and right THA on 08/31/10 under spinal anesthesia.  He also had a cystoscopy, transurethral electrovaporization of the prostate, and fulguration of a bladder lesion under GA on 03/03/11.  I reviewed above with Anesthesiologist Dr. Chaney Malling.  Since he has tolerated two surgical procedures within the past year, anticipate that he can proceed if he remains asymptomatic.

## 2011-07-11 MED ORDER — CEFAZOLIN SODIUM-DEXTROSE 2-3 GM-% IV SOLR
2.0000 g | INTRAVENOUS | Status: AC
  Administered 2011-07-12: 2 g via INTRAVENOUS
  Filled 2011-07-11: qty 50

## 2011-07-12 ENCOUNTER — Encounter (HOSPITAL_COMMUNITY)
Admission: RE | Disposition: A | Discharge status: Home / Self Care | Payer: Self-pay | Source: Ambulatory Visit | Attending: Neurosurgery

## 2011-07-12 ENCOUNTER — Ambulatory Visit (HOSPITAL_COMMUNITY): Payer: Medicare Other

## 2011-07-12 ENCOUNTER — Ambulatory Visit (HOSPITAL_COMMUNITY): Payer: Medicare Other | Admitting: Vascular Surgery

## 2011-07-12 ENCOUNTER — Ambulatory Visit (HOSPITAL_COMMUNITY)
Admission: RE | Admit: 2011-07-12 | Discharge: 2011-07-12 | Disposition: A | Discharge status: Home / Self Care | Payer: Medicare Other | Source: Ambulatory Visit | Attending: Neurosurgery | Admitting: Neurosurgery

## 2011-07-12 ENCOUNTER — Encounter (HOSPITAL_COMMUNITY): Payer: Self-pay | Admitting: Vascular Surgery

## 2011-07-12 DIAGNOSIS — I10 Essential (primary) hypertension: Secondary | ICD-10-CM | POA: Diagnosis not present

## 2011-07-12 DIAGNOSIS — Z01810 Encounter for preprocedural cardiovascular examination: Secondary | ICD-10-CM | POA: Insufficient documentation

## 2011-07-12 DIAGNOSIS — M810 Age-related osteoporosis without current pathological fracture: Secondary | ICD-10-CM | POA: Diagnosis not present

## 2011-07-12 DIAGNOSIS — K219 Gastro-esophageal reflux disease without esophagitis: Secondary | ICD-10-CM | POA: Insufficient documentation

## 2011-07-12 DIAGNOSIS — Z01812 Encounter for preprocedural laboratory examination: Secondary | ICD-10-CM | POA: Insufficient documentation

## 2011-07-12 DIAGNOSIS — J4489 Other specified chronic obstructive pulmonary disease: Secondary | ICD-10-CM | POA: Insufficient documentation

## 2011-07-12 DIAGNOSIS — N401 Enlarged prostate with lower urinary tract symptoms: Secondary | ICD-10-CM | POA: Diagnosis not present

## 2011-07-12 DIAGNOSIS — M549 Dorsalgia, unspecified: Secondary | ICD-10-CM | POA: Diagnosis not present

## 2011-07-12 DIAGNOSIS — J449 Chronic obstructive pulmonary disease, unspecified: Secondary | ICD-10-CM | POA: Insufficient documentation

## 2011-07-12 DIAGNOSIS — M8448XA Pathological fracture, other site, initial encounter for fracture: Secondary | ICD-10-CM | POA: Insufficient documentation

## 2011-07-12 SURGERY — KYPHOPLASTY
Anesthesia: General | Site: Back | Wound class: Clean

## 2011-07-12 MED ORDER — LACTATED RINGERS IV SOLN
INTRAVENOUS | Status: DC | PRN
  Administered 2011-07-12: 07:00:00 via INTRAVENOUS

## 2011-07-12 MED ORDER — SODIUM CHLORIDE 0.9 % IJ SOLN: 3.0000 mL | Freq: Two times a day (BID) | INTRAMUSCULAR | Status: DC

## 2011-07-12 MED ORDER — METHOCARBAMOL 500 MG PO TABS: 500.0000 mg | ORAL_TABLET | Freq: Four times a day (QID) | ORAL | Status: DC | PRN

## 2011-07-12 MED ORDER — SODIUM CHLORIDE 0.9 % IV SOLN: 250.0000 mL | INTRAVENOUS | Status: DC

## 2011-07-12 MED ORDER — MORPHINE SULFATE 4 MG/ML IJ SOLN: 1.0000 mg | INTRAMUSCULAR | Status: DC | PRN

## 2011-07-12 MED ORDER — PHENOL 1.4 % MT LIQD: 1.0000 | OROMUCOSAL | Status: DC | PRN

## 2011-07-12 MED ORDER — HYDROMORPHONE HCL PF 1 MG/ML IJ SOLN
0.2500 mg | INTRAMUSCULAR | Status: DC | PRN
  Administered 2011-07-12: 0.5 mg via INTRAVENOUS

## 2011-07-12 MED ORDER — GLYCOPYRROLATE 0.2 MG/ML IJ SOLN
INTRAMUSCULAR | Status: DC | PRN
  Administered 2011-07-12: .4 mg via INTRAVENOUS

## 2011-07-12 MED ORDER — SODIUM CHLORIDE 0.9 % IJ SOLN: 3.0000 mL | INTRAMUSCULAR | Status: DC | PRN

## 2011-07-12 MED ORDER — ACETAMINOPHEN 325 MG PO TABS: 650.0000 mg | ORAL_TABLET | ORAL | Status: DC | PRN

## 2011-07-12 MED ORDER — IOHEXOL 300 MG/ML  SOLN
INTRAMUSCULAR | Status: DC | PRN
  Administered 2011-07-12: 50 mL

## 2011-07-12 MED ORDER — HYDROMORPHONE HCL PF 1 MG/ML IJ SOLN
INTRAMUSCULAR | Status: AC
  Filled 2011-07-12: qty 1

## 2011-07-12 MED ORDER — FENTANYL CITRATE 0.05 MG/ML IJ SOLN
INTRAMUSCULAR | Status: DC | PRN
  Administered 2011-07-12: 100 ug via INTRAVENOUS

## 2011-07-12 MED ORDER — DOCUSATE SODIUM 100 MG PO CAPS: 100.0000 mg | ORAL_CAPSULE | Freq: Two times a day (BID) | ORAL | Status: DC

## 2011-07-12 MED ORDER — ONDANSETRON HCL 4 MG/2ML IJ SOLN: 4.0000 mg | INTRAMUSCULAR | Status: DC | PRN

## 2011-07-12 MED ORDER — FOLIC ACID 1 MG PO TABS
1.0000 mg | ORAL_TABLET | Freq: Every day | ORAL | Status: DC
  Filled 2011-07-12: qty 1

## 2011-07-12 MED ORDER — METHOCARBAMOL 100 MG/ML IJ SOLN: 500.0000 mg | Freq: Four times a day (QID) | INTRAVENOUS | Status: DC | PRN

## 2011-07-12 MED ORDER — PANTOPRAZOLE SODIUM 40 MG PO TBEC: 40.0000 mg | DELAYED_RELEASE_TABLET | Freq: Every day | ORAL | Status: DC

## 2011-07-12 MED ORDER — PHENYLEPHRINE HCL 10 MG/ML IJ SOLN
20.0000 mg | INTRAVENOUS | Status: DC | PRN
  Administered 2011-07-12: 10 ug/min via INTRAVENOUS

## 2011-07-12 MED ORDER — ONDANSETRON HCL 4 MG/2ML IJ SOLN: 4.0000 mg | Freq: Once | INTRAMUSCULAR | Status: DC | PRN

## 2011-07-12 MED ORDER — VITAMIN B-12 1000 MCG PO TABS
1000.0000 ug | ORAL_TABLET | Freq: Every day | ORAL | Status: DC
  Filled 2011-07-12: qty 1

## 2011-07-12 MED ORDER — CITALOPRAM HYDROBROMIDE 20 MG PO TABS
20.0000 mg | ORAL_TABLET | Freq: Every day | ORAL | Status: DC
  Filled 2011-07-12: qty 1

## 2011-07-12 MED ORDER — LIDOCAINE-EPINEPHRINE 1 %-1:100000 IJ SOLN
INTRAMUSCULAR | Status: DC | PRN
  Administered 2011-07-12: 20 mL

## 2011-07-12 MED ORDER — MENTHOL 3 MG MT LOZG: 1.0000 | LOZENGE | OROMUCOSAL | Status: DC | PRN

## 2011-07-12 MED ORDER — CALCIUM CARBONATE-VITAMIN D 500-200 MG-UNIT PO TABS
1.0000 | ORAL_TABLET | Freq: Every day | ORAL | Status: DC
  Filled 2011-07-12: qty 1

## 2011-07-12 MED ORDER — ZOLPIDEM TARTRATE 5 MG PO TABS: 5.0000 mg | ORAL_TABLET | Freq: Every evening | ORAL | Status: DC | PRN

## 2011-07-12 MED ORDER — LIDOCAINE HCL 4 % MT SOLN
OROMUCOSAL | Status: DC | PRN
  Administered 2011-07-12: 4 mL via TOPICAL

## 2011-07-12 MED ORDER — CEFAZOLIN SODIUM 1-5 GM-% IV SOLN
1.0000 g | Freq: Three times a day (TID) | INTRAVENOUS | Status: DC
  Administered 2011-07-12: 1 g via INTRAVENOUS
  Filled 2011-07-12 (×2): qty 50

## 2011-07-12 MED ORDER — VERAPAMIL HCL ER 240 MG PO TBCR: 240.0000 mg | EXTENDED_RELEASE_TABLET | Freq: Every day | ORAL | Status: DC

## 2011-07-12 MED ORDER — PROPOFOL 10 MG/ML IV EMUL
INTRAVENOUS | Status: DC | PRN
  Administered 2011-07-12: 130 mg via INTRAVENOUS

## 2011-07-12 MED ORDER — VITAMIN D3 25 MCG (1000 UNIT) PO TABS
1000.0000 [IU] | ORAL_TABLET | Freq: Every day | ORAL | Status: DC
  Filled 2011-07-12: qty 1

## 2011-07-12 MED ORDER — BUPIVACAINE HCL (PF) 0.5 % IJ SOLN
INTRAMUSCULAR | Status: DC | PRN
  Administered 2011-07-12: 30 mL

## 2011-07-12 MED ORDER — POTASSIUM CHLORIDE IN NACL 20-0.45 MEQ/L-% IV SOLN
INTRAVENOUS | Status: DC
  Filled 2011-07-12 (×2): qty 1000

## 2011-07-12 MED ORDER — ALUM & MAG HYDROXIDE-SIMETH 200-200-20 MG/5ML PO SUSP: 30.0000 mL | Freq: Four times a day (QID) | ORAL | Status: DC | PRN

## 2011-07-12 MED ORDER — ACETAMINOPHEN 650 MG RE SUPP: 650.0000 mg | RECTAL | Status: DC | PRN

## 2011-07-12 MED ORDER — HYDROCODONE-ACETAMINOPHEN 5-325 MG PO TABS: 1.0000 | ORAL_TABLET | ORAL | Status: DC | PRN

## 2011-07-12 MED ORDER — GABAPENTIN 300 MG PO CAPS
300.0000 mg | ORAL_CAPSULE | Freq: Every day | ORAL | Status: DC
  Filled 2011-07-12: qty 1

## 2011-07-12 MED ORDER — ONDANSETRON HCL 4 MG/2ML IJ SOLN
INTRAMUSCULAR | Status: DC | PRN
  Administered 2011-07-12: 4 mg via INTRAVENOUS

## 2011-07-12 MED ORDER — LIDOCAINE HCL (CARDIAC) 20 MG/ML IV SOLN
INTRAVENOUS | Status: DC | PRN
  Administered 2011-07-12: 50 mg via INTRAVENOUS

## 2011-07-12 SURGICAL SUPPLY — 43 items
8CC CEMENT CARTRIDGE ×2 IMPLANT
ADH SKN CLS APL DERMABOND .7 (GAUZE/BANDAGES/DRESSINGS) ×1
BLADE SURG 15 STRL LF DISP TIS (BLADE) ×1 IMPLANT
BLADE SURG 15 STRL SS (BLADE) ×2
BLADE SURG ROTATE 9660 (MISCELLANEOUS) IMPLANT
CEMENT BONE KYPHX HV R (Orthopedic Implant) ×1 IMPLANT
CEMENT KYPHON C01A KIT/MIXER (Cement) ×1 IMPLANT
CLOTH BEACON ORANGE TIMEOUT ST (SAFETY) ×2 IMPLANT
CONT SPEC 4OZ CLIKSEAL STRL BL (MISCELLANEOUS) ×3 IMPLANT
DERMABOND ADVANCED (GAUZE/BANDAGES/DRESSINGS) ×1
DERMABOND ADVANCED .7 DNX12 (GAUZE/BANDAGES/DRESSINGS) ×1 IMPLANT
DRAPE C-ARM 42X72 X-RAY (DRAPES) ×2 IMPLANT
DRAPE LAPAROTOMY 100X72X124 (DRAPES) ×2 IMPLANT
DRAPE PROXIMA HALF (DRAPES) ×2 IMPLANT
DRAPE SURG 17X23 STRL (DRAPES) ×2 IMPLANT
DRESSING TELFA 8X3 (GAUZE/BANDAGES/DRESSINGS) IMPLANT
DURAPREP 26ML APPLICATOR (WOUND CARE) ×2 IMPLANT
GAUZE SPONGE 4X4 16PLY XRAY LF (GAUZE/BANDAGES/DRESSINGS) ×2 IMPLANT
GLOVE BIO SURGEON STRL SZ8 (GLOVE) ×2 IMPLANT
GLOVE BIOGEL PI IND STRL 8.5 (GLOVE) ×1 IMPLANT
GLOVE BIOGEL PI INDICATOR 8.5 (GLOVE) ×1
GLOVE EXAM NITRILE LRG STRL (GLOVE) IMPLANT
GLOVE EXAM NITRILE MD LF STRL (GLOVE) IMPLANT
GLOVE EXAM NITRILE XL STR (GLOVE) IMPLANT
GLOVE EXAM NITRILE XS STR PU (GLOVE) IMPLANT
GOWN BRE IMP SLV AUR LG STRL (GOWN DISPOSABLE) IMPLANT
GOWN BRE IMP SLV AUR XL STRL (GOWN DISPOSABLE) ×1 IMPLANT
GOWN STRL REIN 2XL LVL4 (GOWN DISPOSABLE) ×1 IMPLANT
KIT BASIN OR (CUSTOM PROCEDURE TRAY) ×2 IMPLANT
KIT ROOM TURNOVER OR (KITS) ×2 IMPLANT
KYPHON HV-R BONE CEMENT AND MIXER PACK ×1 IMPLANT
NDL HYPO 25X1 1.5 SAFETY (NEEDLE) ×1 IMPLANT
NEEDLE HYPO 25X1 1.5 SAFETY (NEEDLE) ×2 IMPLANT
NS IRRIG 1000ML POUR BTL (IV SOLUTION) ×2 IMPLANT
PACK SURGICAL SETUP 50X90 (CUSTOM PROCEDURE TRAY) ×2 IMPLANT
PAD ARMBOARD 7.5X6 YLW CONV (MISCELLANEOUS) ×6 IMPLANT
STAPLER SKIN PROX WIDE 3.9 (STAPLE) ×2 IMPLANT
SUT VIC AB 3-0 SH 8-18 (SUTURE) ×2 IMPLANT
SYR CONTROL 10ML LL (SYRINGE) ×4 IMPLANT
TOWEL OR 17X24 6PK STRL BLUE (TOWEL DISPOSABLE) ×2 IMPLANT
TOWEL OR 17X26 10 PK STRL BLUE (TOWEL DISPOSABLE) ×2 IMPLANT
TRAY KYPHOPAK 20/3 ONESTEP 1ST (MISCELLANEOUS) IMPLANT
TRAY KYPHOPAK 20/3 ONESTEP CDS (KITS) ×1 IMPLANT

## 2011-07-12 NOTE — Interval H&P Note (Signed)
History and Physical Interval Note:  07/12/2011 7:29 AM  [name] [name]  has presented today for surgery, with the diagnosis of Lumbar compression fractures  The various methods of treatment have been discussed with the patient and family. After consideration of risks, benefits and other options for treatment, the patient has consented to  Procedure(s): KYPHOPLASTY as a surgical intervention .  The patients' history has been reviewed, patient examined, no change in status, stable for surgery.  I have reviewed the patients' chart and labs.  Questions were answered to the patient's satisfaction.     [name] D  Date of Initial H&P:06/13/2011  History reviewed, patient examined, no change in status, stable for surgery.

## 2011-07-12 NOTE — Transfer of Care (Signed)
Immediate Anesthesia Transfer of Care Note  Patient: [name] [name]  Procedure(s) Performed:  KYPHOPLASTY - Lumbar three, four kyphoplasty  Patient Location: PACU  Anesthesia Type: General  Level of Consciousness: awake and sedated  Airway & Oxygen Therapy: Patient Spontanous Breathing and Patient connected to face mask oxygen  Post-op Assessment: Report given to PACU RN and Post -op Vital signs reviewed and stable  Post vital signs: Reviewed and stable Filed Vitals:   07/12/11 0900  BP:   Pulse:   Temp: 36.1 C  Resp:     Complications: No apparent anesthesia complications

## 2011-07-12 NOTE — Preoperative (Signed)
Beta Blockers   Reason not to administer Beta Blockers:Not Applicable 

## 2011-07-12 NOTE — Discharge Summary (Signed)
Physician Discharge Summary  Patient ID: [name] [name] MRN: [id number] DOB/AGE: 76-Jun-1934 76 y.o.  Admit date: 07/12/2011 Discharge date: 07/12/2011  Admission Diagnoses:Compression fractures Lumbar 3 and 4  Discharge Diagnoses: Compression fractures Lumbar 3 and 4  Active Problems:  * No active hospital problems. *    Discharged Condition: good  Hospital Course: Uncomplicated recovery following kyphoplasty  Consults: none  Significant Diagnostic Studies: None  Treatments: surgery: Kypholpasty L3 and L4  Discharge Exam: Blood pressure 130/62, pulse 41, temperature 97.3 F (36.3 C), temperature source Oral, resp. rate 16, SpO2 98.00%. Neurologic: Alert and oriented X 3, normal strength and tone. Normal symmetric reflexes. Normal coordination and gait  Disposition: Home   Discharge Medication List as of 07/12/2011  3:40 PM    CONTINUE these medications which have NOT CHANGED   Details  calcium-vitamin D (OSCAL WITH D) 500-200 MG-UNIT per tablet Take 1 tablet by mouth daily.  , Until Discontinued, Historical Med    cholecalciferol (VITAMIN D) 1000 UNITS tablet Take 1,000 Units by mouth daily.  , Until Discontinued, Historical Med    citalopram (CELEXA) 20 MG tablet Take 20 mg by mouth daily.  , Until Discontinued, Historical Med    folic acid (FOLVITE) 1 MG tablet Take 1 mg by mouth daily. , Until Discontinued, Historical Med    gabapentin (NEURONTIN) 300 MG capsule Take 300 mg by mouth at bedtime. , Until Discontinued, Historical Med    naproxen sodium (ANAPROX) 220 MG tablet Take 220 mg by mouth 2 (two) times daily as needed. For pain , Until Discontinued, Historical Med    omeprazole (PRILOSEC) 20 MG capsule Take 20 mg by mouth daily. , Until Discontinued, Historical Med    verapamil (CALAN-SR) 240 MG CR tablet Take 240 mg by mouth every morning. , Until Discontinued, Historical Med    vitamin B-12 (CYANOCOBALAMIN) 1000 MCG tablet Take 1,000 mcg by mouth daily.  , Until  Discontinued, Historical Med    zolpidem (AMBIEN) 5 MG tablet Take 5 mg by mouth at bedtime as needed. For sleep , Until Discontinued, Historical Med         Signed: Dorian Heckle, MD 07/12/2011, 5:19 PM

## 2011-07-12 NOTE — Anesthesia Preprocedure Evaluation (Signed)
Anesthesia Evaluation  Patient identified by MRN, date of birth, ID band Patient awake    Reviewed: Allergy & Precautions, H&P , NPO status , Patient's Chart, lab work & pertinent test results  Airway Mallampati: I TM Distance: >3 FB Neck ROM: full    Dental   Pulmonary COPD         Cardiovascular hypertension, + dysrhythmias Atrial Fibrillation     Neuro/Psych  Headaches,  Neuromuscular disease Negative Psych ROS   GI/Hepatic Neg liver ROS, hiatal hernia, GERD-  ,  Endo/Other  Negative Endocrine ROS  Renal/GU negative Renal ROS     Musculoskeletal   Abdominal   Peds  Hematology   Anesthesia Other Findings   Reproductive/Obstetrics                           Anesthesia Physical Anesthesia Plan  ASA: III  Anesthesia Plan: General ETT   Post-op Pain Management:    Induction: Intravenous  Airway Management Planned: Oral ETT  Additional Equipment:   Intra-op Plan:   Post-operative Plan: Extubation in OR  Informed Consent: I have reviewed the patients History and Physical, chart, labs and discussed the procedure including the risks, benefits and alternatives for the proposed anesthesia with the patient or authorized representative who has indicated his/her understanding and acceptance.     Plan Discussed with: Anesthesiologist, CRNA and Surgeon  Anesthesia Plan Comments:         Anesthesia Quick Evaluation

## 2011-07-12 NOTE — Op Note (Signed)
07/12/2011  9:12 AM  PATIENT:  [name] [name]  78 y.o. male  PRE-OPERATIVE DIAGNOSIS:  Lumbar compression fractures L3 and L4  POST-OPERATIVE DIAGNOSIS:  Lumbar compression fractures L3 and L4  PROCEDURE:  Procedure(s): KYPHOPLASTY L3 and L4  SURGEON:  Surgeon(s): Dorian Heckle, MD  PHYSICIAN ASSISTANT:   ASSISTANTS: none   ANESTHESIA:   general  EBL:  Total I/O In: 700 [I.V.:700] Out: -   BLOOD ADMINISTERED:none  DRAINS: none   LOCAL MEDICATIONS USED:  LIDOCAINE 20CC  SPECIMEN:  No Specimen  DISPOSITION OF SPECIMEN:  N/A  COUNTS:  YES  TOURNIQUET:  * No tourniquets in log *  DICTATION: Patient was brought to the operating room and following the smooth and uncomplicated induction of general endotracheal anesthesia the patient was placed in a prone position on chest and pelvic rolls. AP and lateral fluoroscopy were positioned for optimal viewing of the L3 and L4 vertebrae. The back was prepped with DuraPrep and draped in the usual sterile fashion. Unipedicular approach was performed at the L3 and L4 levels from the right side initially at the L4 level which was the site of maximal fracture and vertebral compression. Using AP and lateral fluoroscopy the introducer cannula was inserted down the course of the pedicle and into the vertebra the drill was then used to create a path for the balloon which was then inserted and using AP and lateral fluoroscopy the balloon was inflated with good restoration of vertebral body height and without violation of the superior or inferior endplates. Similar instrumentation was then performed at the L3 level. 8 cc of bone cement was then inserted into the L3 level with good AP and lateral filling of the vertebra and with good interdigitation without evidence of extravasation. A similar fill was then performed at the L4 vertebral level. It was a wisp of bone cement within the L3-4 disc space but this did not enlarge with progressive filling and I was  able to achieve good filling in and AP and lateral projection with 8 cc of bone cement in this portable level. The introducers were then removed the incisions were closed with inverted 3-0 Vicryl stitches and the wounds were dressed with Dermabond. Patient was extubated in the operating room and taken to recovery in stable and satisfactory condition having tolerated his procedure well. Counts were correct at the end of the case.  PLAN OF CARE: Admit for overnight observation  PATIENT DISPOSITION:  PACU - hemodynamically stable.   Delay start of Pharmacological VTE agent (>24hrs) due to surgical blood loss or risk of bleeding:  YES

## 2011-07-12 NOTE — Plan of Care (Signed)
Problem: Consults Goal: Diagnosis - Spinal Surgery Outcome: Completed/Met Date Met:  07/12/11 Kyphoplasty

## 2011-07-12 NOTE — Anesthesia Postprocedure Evaluation (Signed)
  Anesthesia Post-op Note  Patient: [name] [name]  Procedure(s) Performed:  KYPHOPLASTY - Lumbar three, four kyphoplasty  Patient Location: PACU  Anesthesia Type: General  Level of Consciousness: awake, alert , oriented and patient cooperative  Airway and Oxygen Therapy: Patient Spontanous Breathing and Patient connected to nasal cannula oxygen  Post-op Pain: mild  Post-op Assessment: Post-op Vital signs reviewed, Patient's Cardiovascular Status Stable, Respiratory Function Stable, Patent Airway, No signs of Nausea or vomiting and Pain level controlled  Post-op Vital Signs: stable  Complications: No apparent anesthesia complications

## 2011-07-12 NOTE — H&P (Signed)
NEUROSURGICAL CONSULTATION   [name] Cai. [name]    DOB:  Jul 09, 1932 #[id number]    June 13, 2011   HISTORY:     [name] [name] is a 78 year old retired man with right arm, shoulder, neck, and back pain.  He notes a long history of neck pain and recent low back pain.  He says he fell off a ladder in 2003 and says he has to take pain medications. He says he cannot stand for any length of time and cannot walk for more than 500 feet, that he has to sit in a recliner and sleeps in a recliner.  He was referred for evaluation and management of low back pain.  He has been taking Gabapentin 300 mg. at night and Ibuprofen 200 mg. over-the-counter two as needed.  He has been to a chiropractor, but says this has not helped him.    REVIEW OF SYSTEMS:   A detailed Review of Systems sheet was reviewed with the patient.  Pertinent positives include wears glasses, nasal congestion/drainage, sinus problems, sinus headache, high blood pressure, difficulty starting or stopping stream, increased appetite, and excessive urination.  All other systems are negative; this includes Constitutional symptoms, Respiratory, Gastrointestinal, Musculoskeletal, Integumentary & Breast, Neurologic, Psychiatric, Hematologic/Lymphatic, Allergic/Immunologic.    PAST MEDICAL HISTORY:      Current Medical Conditions:    He has a past medical history significant for hypertension, hiatal hernia, bladder cancer and prostate cancer in 2012 with followup scheduled on the 28th of this coming month.      Prior Operations and Hospitalizations:   He has also had a rhinoplasty in 1974, hernia repaired on the right in 1990, and lung tumor in 2006.  He had a right total hip replacement in 2010 and right hip replacement revision in 08/2010.      Medications and Allergies:  Medications - Folic Acid 1 mg. qd, Citalopamil(?sp) qd, Verapamil qd, B12 1000 mcg. qd, Vitamin D 1000 I.U. qd, Omeprazole one qd, Gabapentin 300 mg. q.h.s., and Zolpidem one q.h.s.  DRUG  ALLERGIES - SULFA, TRAMADOL, AND PERCOCET.      Height and Weight:     Mr. Gehlhausen ix 6' tall, 200 lbs.  BMI is 27.1.    SOCIAL HISTORY:    He denies tobacco, alcohol, or drug use.    DIAGNOSTIC STUDIES:   He has new radiographs which demonstrate multiple compression fractures including fracture of L3 and L4 based on my review of a recent MRI, as well as a fractured L5 which may appear to be old and did not have much activity on T2 imaging.    PHYSICAL EXAMINATION:      Blood Pressure, Pulse:     Currently his blood pressure is 122/78.  Heart rate is 70 and regular.  Respiratory rate is 16.      HEENT - normocephalic, atraumatic.  The pupils are equal, round and reactive to light.  The extraocular muscles are intact.  Sclerae - white.  Conjunctiva - pink.  Oropharynx benign.  Uvula midline.     Neck - there are no masses, meningismus, deformities, tracheal deviation, jugular vein distention or carotid bruits.  There is normal cervical range of motion.  Spurlings' test is negative without reproducible radicular pain turning the patient's head to either side.  Lhermitte's sign is not present with axial compression.      Respiratory - there is normal respiratory effort with good intercostal function.  Lungs are clear to auscultation.  There are no rales,  rhonchi or wheezes.      Cardiovascular - the heart has regular rate and rhythm to auscultation.  No murmurs are appreciated.  There is no extremity edema, cyanosis or clubbing.  There are palpable pedal pulses.      Abdomen - soft, nontender, no hepatosplenomegaly appreciated or masses.  There are active bowel sounds.  No guarding or rebound.      Musculoskeletal Examination - he has pain at the lumbosacral junction and also at the region of L3 and L4.  He is limited in terms of forward bending secondary to discomfort in his back.      NEUROLOGICAL EXAMINATION: The patient is oriented to time, person and place and has good recall of both recent and  remote memory with normal attention span and concentration.  The patient speaks with clear and fluent speech and exhibits normal language function and appropriate fund of knowledge.      Cranial Nerve Examination - pupils are equal, round and reactive to light.  Extraocular movements are full.  Visual fields are full to confrontational testing.  Facial sensation and facial movement are symmetric and intact.  Hearing is intact to finger rub.  Palate is upgoing.  Shoulder shrug is symmetric.  Tongue protrudes in the midline.      Motor Examination - motor strength is 5/5 in the bilateral deltoids, biceps, triceps, handgrips, wrist extensors, interosseous.  In the lower extremities motor strength is 5/5 in hip flexion, extension, quadriceps, hamstrings, plantar flexion, dorsiflexion and extensor hallucis longus.      Sensory Examination - normal to light touch and pinprick sensation in the upper and lower extremities.     Deep Tendon Reflexes - 2 in the biceps, triceps, and brachioradialis, 2 in the knees, 2 in the ankles.  The great toes are downgoing to plantar stimulation.      Cerebellar Examination - normal coordination in upper and lower extremities and normal rapid alternating movements.  Romberg test is negative.    IMPRESSION AND RECOMMENDATIONS: [name] [name] is a 78 year old retired man with low back pain and osteoporosis with compression fractures of L3 and L4, possibly L5.  I have suggested that he proceed with kyphoplasty procedure given the severity of his pain.  I think this is a reasonable option for him.  We discussed this procedure in detail and I showed him models. We will plan on doing this in the near future.  Risks and benefits were discussed and the patient wished to proceed.    [facility]    Danae Orleans. Venetia Maxon, M.D.  JDS:aft cc: Dr. Fuller Canada   Dr. Lubertha South

## 2011-07-12 NOTE — Progress Notes (Signed)
Subjective: Patient reports "My back doesn't hurt as much as it did yesterday."  Objective: Vital signs in last 24 hours: Temp:  [97 F (36.1 C)-97.9 F (36.6 C)] 97.3 F (36.3 C) (01/15 1200) Pulse Rate:  [37-111] 41  (01/15 1200) Resp:  [14-16] 16  (01/15 1200) BP: (104-139)/(55-76) 130/62 mmHg (01/15 1200) SpO2:  [70 %-100 %] 98 % (01/15 1200)  Intake/Output from previous day:   Intake/Output this shift: Total I/O In: 1180 [P.O.:480; I.V.:700] Out: -   Alert, conversant. Wife and son at bedside. Injection sites lumbar region with Dermabond. No erythema, swelling, or drainage. Good strength BLE.  Lab Results: No results found for this basename: WBC:2,HGB:2,HCT:2,PLT:2 in the last 72 hours BMET No results found for this basename: NA:2,K:2,CL:2,CO2:2,GLUCOSE:2,BUN:2,CREATININE:2,CALCIUM:2 in the last 72 hours  Studies/Results: Dg Lumbar Spine 2-3 Views  07/12/2011  *RADIOLOGY REPORT*  Clinical Data: Back pain  LUMBAR SPINE - 2-3 VIEW  Comparison: MRI lumbar spine 05/11/2011  Findings: C-arm films document placement of cement in the L3 and L4 vertebral bodies which are moderately to severely compressed.  The appearance is similar to prior MRI from November.  There is slight posterior extension of cement at L3, incompletely evaluated.  There is slight extension of cement at L4 superiorly into the L3-4 disc space.  IMPRESSION: L3 and L4 kyphoplasty as described.  Per CMS PQRS reporting requirements (PQRS Measure 24): Given the patient's age of greater than 50 and the fracture site (hip, distal radius, or spine), the patient should be tested for osteoporosis using DXA, and the appropriate treatment considered based on the DXA results.  Original Report Authenticated By: Elsie Stain, M.D.    Assessment/Plan: Improved.  LOS: 0 days  D/c IV; d/c to home upon IVAB completion. Hydrocodone 5/325 i po q6hrs prn pain #50 will be called to [facility] in [place] per Dr. Fredrich Birks  order.  Pt knows to call the office to schedule a 3-4 week post-op visit.   Georgiann Cocker 07/12/2011, 1:55 PM

## 2011-07-20 DIAGNOSIS — D090 Carcinoma in situ of bladder: Secondary | ICD-10-CM | POA: Diagnosis not present

## 2011-08-01 DIAGNOSIS — M545 Low back pain: Secondary | ICD-10-CM | POA: Diagnosis not present

## 2011-08-01 DIAGNOSIS — IMO0002 Reserved for concepts with insufficient information to code with codable children: Secondary | ICD-10-CM | POA: Diagnosis not present

## 2011-08-12 ENCOUNTER — Ambulatory Visit (INDEPENDENT_AMBULATORY_CARE_PROVIDER_SITE_OTHER): Payer: Medicare Other | Admitting: Urology

## 2011-08-12 DIAGNOSIS — Z8551 Personal history of malignant neoplasm of bladder: Secondary | ICD-10-CM

## 2011-08-12 DIAGNOSIS — N401 Enlarged prostate with lower urinary tract symptoms: Secondary | ICD-10-CM

## 2011-08-12 DIAGNOSIS — N3941 Urge incontinence: Secondary | ICD-10-CM

## 2011-08-12 DIAGNOSIS — D090 Carcinoma in situ of bladder: Secondary | ICD-10-CM | POA: Diagnosis not present

## 2011-09-08 ENCOUNTER — Other Ambulatory Visit (HOSPITAL_COMMUNITY): Payer: Self-pay | Admitting: Diagnostic Radiology

## 2011-10-07 ENCOUNTER — Ambulatory Visit (INDEPENDENT_AMBULATORY_CARE_PROVIDER_SITE_OTHER): Payer: Medicare Other | Admitting: Urology

## 2011-10-07 DIAGNOSIS — N320 Bladder-neck obstruction: Secondary | ICD-10-CM

## 2011-10-07 DIAGNOSIS — N3941 Urge incontinence: Secondary | ICD-10-CM | POA: Diagnosis not present

## 2011-10-07 DIAGNOSIS — Z8551 Personal history of malignant neoplasm of bladder: Secondary | ICD-10-CM

## 2011-10-07 DIAGNOSIS — D090 Carcinoma in situ of bladder: Secondary | ICD-10-CM | POA: Diagnosis not present

## 2011-10-10 ENCOUNTER — Other Ambulatory Visit: Payer: Self-pay | Admitting: Urology

## 2011-10-10 DIAGNOSIS — B86 Scabies: Secondary | ICD-10-CM | POA: Diagnosis not present

## 2011-10-11 ENCOUNTER — Encounter (HOSPITAL_COMMUNITY): Payer: Self-pay | Admitting: Pharmacy Technician

## 2011-10-11 NOTE — Patient Instructions (Addendum)
22 [name] [name]  10/11/2011   Your procedure is scheduled on:   10/18/2011  Report to [facility] at  1030  AM.  Call this number if you have problems the morning of surgery: [phone]   Remember:   Do not eat food:After Midnight.  May have clear liquids:until Midnight .  Clear liquids include soda, tea, black coffee, apple or grape juice, broth.  Take these medicines the morning of surgery with A SIP OF WATER:  Celexa,neurotin,prilosec,calan   Do not wear jewelry, make-up or nail polish.  Do not wear lotions, powders, or perfumes. You may wear deodorant.  Do not shave 48 hours prior to surgery.  Do not bring valuables to the hospital.  Contacts, dentures or bridgework may not be worn into surgery.  Leave suitcase in the car. After surgery it may be brought to your room.  For patients admitted to the hospital, checkout time is 11:00 AM the day of discharge.   Patients discharged the day of surgery will not be allowed to drive home.  Name and phone number of your driver: family  Special Instructions: CHG Shower Use Special Wash: 1/2 bottle night before surgery and 1/2 bottle morning of surgery.   Please read over the following fact sheets that you were given: Pain Booklet, MRSA Information, Surgical Site Infection Prevention, Anesthesia Post-op Instructions and Care and Recovery After Surgery Cystoscopy (Bladder Exam) A cystoscopy is an examination of your urinary bladder with a cystoscope. A cystoscope is an instrument like a small telescope with strong lights and lenses. It is inserted into the bladder through the urethra (the opening into the bladder) and allows your caregiver to examine the inside of your bladder. The procedure causes little discomfort and can be done in a hospital or office. It is a diagnostic procedure to evaluate the inside of your bladder. It may involve x-rays to further evaluate the ureters or internal aspects of the kidneys. It may aid in the removal of urinary stones    or in taking tissue samples (biopsies) if necessary. The procedure is easier in females because of a shorter urethra. In a male, the procedure must be done through the penis. This often requires more sedation and more time to do the procedure. The procedure usually takes twenty minutes to one half hour for a male and approximately an hour for a male. LET YOUR CAREGIVERS KNOW ABOUT:  Allergies.   Medications taken including herbs, eye drops, over the counter medications, and creams.   Use of steroids (by mouth or creams).   Previous problems with anesthetics or novocaine.   Possibility of pregnancy, if this applies.   History of blood clots (thrombophlebitis).   History of bleeding or blood problems.   Previous surgery, especially where prosthetics have been used like hip or knee replacements, and heart valve replacements.   Other health problems.  BEFORE THE PROCEDURE  You should be present 60 minutes prior to your procedure or as directed.  PROCEDURE During the procedure, you will:  Be assisted by your urologist and a nurse.   Lie on a cystoscopy table with your knees elevated and legs apart and covered with a drape. For women this is the same position as when a pap smear is taken.   Have the urethral area or penis washed and covered with sterile towels.   Have an anesthetic (numbing) jelly applied to the urethra. This is usually all that is required for females but males may also require sedation.  Have the cystoscope inserted through the urethra and into the bladder. Sterile fluid will flow through the cystoscope and into the bladder. This will expand the bladder and provide clear fluid for the urologist to look through and examine the interior of the bladder.   Be allowed to go home once you are doing well, are stable, and awake if you were given a sedative. If given a sedative, have someone give you a ride home.  AFTER THE PROCEDURE  You may have temporary bleeding and  burning on urination.   Drink lots of fluids.  SEEK IMMEDIATE MEDICAL CARE IF:  There is an increase in blood in the urine or if you are passing clots.   You have difficulty in passing your urine   You develop chills and/or an unexplained oral temperature above 102 F (38.9 C).  Your caregiver will discuss your results with you following the procedure. This may be at a later time if you have been sedated. If other testing or biopsies were taken, ask your caregiver how you are to obtain the results. Remember it is your responsibility to get your results. Do not assume everything is normal if you do not hear from your caregiver. Document Released: 06/10/2000 Document Revised: 06/02/2011 Document Reviewed: 04/03/2008 [facility] Patient Information 2012 [place], [place].PATIENT INSTRUCTIONS POST-ANESTHESIA  IMMEDIATELY FOLLOWING SURGERY:  Do not drive or operate machinery for the first twenty four hours after surgery.  Do not make any important decisions for twenty four hours after surgery or while taking narcotic pain medications or sedatives.  If you develop intractable nausea and vomiting or a severe headache please notify your doctor immediately.  FOLLOW-UP:  Please make an appointment with your surgeon as instructed. You do not need to follow up with anesthesia unless specifically instructed to do so.  WOUND CARE INSTRUCTIONS (if applicable):  Keep a dry clean dressing on the anesthesia/puncture wound site if there is drainage.  Once the wound has quit draining you may leave it open to air.  Generally you should leave the bandage intact for twenty four hours unless there is drainage.  If the epidural site drains for more than 36-48 hours please call the anesthesia department.  QUESTIONS?:  Please feel free to call your physician or the hospital operator if you have any questions, and they will be happy to assist you.     [facility] Anesthesia Department [address] [place]  [place] [phone]

## 2011-10-12 ENCOUNTER — Encounter (HOSPITAL_COMMUNITY)
Admission: RE | Admit: 2011-10-12 | Discharge: 2011-10-12 | Disposition: A | Discharge status: Home / Self Care | Payer: Medicare Other | Source: Ambulatory Visit | Attending: Urology | Admitting: Urology

## 2011-10-12 ENCOUNTER — Encounter (HOSPITAL_COMMUNITY): Payer: Self-pay

## 2011-10-12 DIAGNOSIS — Z01812 Encounter for preprocedural laboratory examination: Secondary | ICD-10-CM | POA: Diagnosis not present

## 2011-10-12 DIAGNOSIS — Z79899 Other long term (current) drug therapy: Secondary | ICD-10-CM | POA: Diagnosis not present

## 2011-10-12 DIAGNOSIS — I10 Essential (primary) hypertension: Secondary | ICD-10-CM | POA: Diagnosis not present

## 2011-10-12 DIAGNOSIS — N401 Enlarged prostate with lower urinary tract symptoms: Secondary | ICD-10-CM | POA: Diagnosis not present

## 2011-10-12 DIAGNOSIS — N3941 Urge incontinence: Secondary | ICD-10-CM | POA: Diagnosis not present

## 2011-10-12 DIAGNOSIS — D090 Carcinoma in situ of bladder: Secondary | ICD-10-CM | POA: Diagnosis not present

## 2011-10-12 DIAGNOSIS — N320 Bladder-neck obstruction: Secondary | ICD-10-CM | POA: Diagnosis not present

## 2011-10-12 DIAGNOSIS — Z01810 Encounter for preprocedural cardiovascular examination: Secondary | ICD-10-CM | POA: Diagnosis not present

## 2011-10-12 LAB — BASIC METABOLIC PANEL
CO2: 28 mEq/L (ref 19–32)
Calcium: 9.6 mg/dL (ref 8.4–10.5)
Creatinine, Ser: 1.09 mg/dL (ref 0.50–1.35)
Glucose, Bld: 97 mg/dL (ref 70–99)
Sodium: 137 mEq/L (ref 135–145)

## 2011-10-12 LAB — SURGICAL PCR SCREEN: MRSA, PCR: NEGATIVE

## 2011-10-17 NOTE — H&P (Signed)
Problems 1. Benign Prostatic Hypertrophy With Urinary Obstruction 600.01 2. Bladder Neck Contracture 596.0 3. History of  Carcinoma In Situ Of The Bladder 233.7 4. Diverticulum Of The Bladder 596.3 5. Urge Incontinence Of Urine 788.31 6. History of  Urinary Tract Infection V13.02  History of Present Illness  [name] [name] returns today in f/u.  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   He got 4/6 BCG treatments last fall but we had to stop because of side effects. He is having some recurrent problems with UUI.  He had been on Vesicare but stopped that prior to his last visit.  His stream has improved since the TURP.   He had a cytology in February that was negative and his urine is clear today.  He has had no hematuria.   Past Medical History 1. History of  Acute Subdural Hematoma 432.1 2. History of  Arthritis V13.4 3. History of  Atrial Fibrillation 427.31 4. History of  Bladder Calculus V13.01 5. History of  Carcinoma In Situ Of The Bladder 233.7 6. History of  Chronic Cystitis 595.2 7. History of  Gross Hematuria 599.71 8. History of  Hypertension 401.9 9. History of  Urinary Tract Infection V13.02  Surgical History 1. History of  Cystourethroscopy With Irrigation And Evacuation Of Clots 2. History of  Inguinal Hernia Repair 3. History of  Rhinoplasty 4. History of  Total Hip Replacement 5. History of  Transurethral Resection Of Prostate (TURP)  Allergies 1. Sulfa Drugs 2. Percocet TABS 3. Tramadol  Family History 1. Family history of  Death In The Family Father 2. Family history of  Death In The Family Mother 3. Family history of  Family Health Status Children ___ Living Sons 4. Family history of  Heart Disease V17.49 5. Fraternal history of  Prostate Cancer V16.42  Social History 1. Caffeine Use 2. Chewing Nicotine-containing Substances Tobacco 30 years but quit in 1993. 3. Marital History - Currently  Married 4. Never A Smoker 5. Retired From Work 6. Stopped Drinking Alcohol a year ago but drank heavily prior to that.  Review of Systems  Genitourinary: incontinence, but no hematuria.  Gastrointestinal: constipation.  Cardiovascular: no chest pain.  Respiratory: no shortness of breath.    Vitals Vital Signs [Data Includes: Last 1 Day]  12Apr2013 11:27AM  Blood Pressure: 116 / 73 Temperature: 97.5 F Heart Rate: 73  Physical Exam Constitutional: Well nourished and well developed . No acute distress.  Pulmonary: No respiratory distress and normal respiratory rhythm and effort.  Cardiovascular: Heart rate and rhythm are normal . No peripheral edema.    Results/Data Urine [Data Includes: Last 1 Day]   12Apr2013  COLOR YELLOW   APPEARANCE CLEAR   SPECIFIC GRAVITY 1.015   pH 6.0   GLUCOSE NEG mg/dL  BILIRUBIN NEG   KETONE NEG mg/dL  BLOOD NEG   PROTEIN NEG mg/dL  UROBILINOGEN 0.2 mg/dL  NITRITE NEG   LEUKOCYTE ESTERASE NEG    Procedure  Procedure: Cystoscopy  Indication: Lower Urinary Tract Symptoms. History of Urothelial Carcinoma.  Prep: The patient was prepped with betadine.  Procedure Note:  Urethral meatus:. No abnormalities.  Anterior urethra: No abnormalities.  Prostatic urethra:. A bladder neck contracture was present. (which is quite severe and wouldn't allow passage of the scope).  Bladder: I couldn't getting into the bladder. The patient tolerated the procedure well.  Complications: None.    Assessment 1.  Benign Prostatic Hypertrophy With Urinary Obstruction 600.01 2. Urge Incontinence Of Urine 788.31 3. Carcinoma In Situ Of The Bladder 233.7 4. Bladder Neck Contracture 596.0   He has some recurrent UUI and has a progressive bladder neck contracture.   Plan Bladder Neck Contracture (596.0)  1. Follow-up Schedule Surgery Office  Follow-up  Requested for: 12Apr2013 PMH: Carcinoma In Situ Of The Bladder (233.7)  2. UA With REFLEX  Done: 12Apr2013  11:31AM 3. URINE CYTOLOGY  Requested for: 12Apr2013   I am going to set him up for an outpt cystoscopy with incision of a bladder neck contracture.  I have reviewed the risks of bleeding, infection, incontinence, thrombotic events and anesthetic risks.

## 2011-10-18 ENCOUNTER — Encounter (HOSPITAL_COMMUNITY)
Admission: RE | Disposition: A | Discharge status: Home / Self Care | Payer: Self-pay | Source: Ambulatory Visit | Attending: Urology

## 2011-10-18 ENCOUNTER — Ambulatory Visit (HOSPITAL_COMMUNITY): Payer: Medicare Other | Admitting: Anesthesiology

## 2011-10-18 ENCOUNTER — Encounter (HOSPITAL_COMMUNITY): Payer: Self-pay | Admitting: Anesthesiology

## 2011-10-18 ENCOUNTER — Encounter (HOSPITAL_COMMUNITY): Payer: Self-pay | Admitting: *Deleted

## 2011-10-18 ENCOUNTER — Ambulatory Visit (HOSPITAL_COMMUNITY)
Admission: RE | Admit: 2011-10-18 | Discharge: 2011-10-18 | Disposition: A | Discharge status: Home / Self Care | Payer: Medicare Other | Source: Ambulatory Visit | Attending: Urology | Admitting: Urology

## 2011-10-18 DIAGNOSIS — Z01810 Encounter for preprocedural cardiovascular examination: Secondary | ICD-10-CM | POA: Insufficient documentation

## 2011-10-18 DIAGNOSIS — Z01812 Encounter for preprocedural laboratory examination: Secondary | ICD-10-CM | POA: Insufficient documentation

## 2011-10-18 DIAGNOSIS — N320 Bladder-neck obstruction: Secondary | ICD-10-CM | POA: Diagnosis not present

## 2011-10-18 DIAGNOSIS — N3941 Urge incontinence: Secondary | ICD-10-CM | POA: Insufficient documentation

## 2011-10-18 DIAGNOSIS — N401 Enlarged prostate with lower urinary tract symptoms: Secondary | ICD-10-CM | POA: Insufficient documentation

## 2011-10-18 DIAGNOSIS — Z79899 Other long term (current) drug therapy: Secondary | ICD-10-CM | POA: Insufficient documentation

## 2011-10-18 DIAGNOSIS — N138 Other obstructive and reflux uropathy: Secondary | ICD-10-CM | POA: Insufficient documentation

## 2011-10-18 DIAGNOSIS — I10 Essential (primary) hypertension: Secondary | ICD-10-CM | POA: Insufficient documentation

## 2011-10-18 DIAGNOSIS — D090 Carcinoma in situ of bladder: Secondary | ICD-10-CM | POA: Insufficient documentation

## 2011-10-18 HISTORY — PX: CYSTOSCOPY: SHX5120

## 2011-10-18 SURGERY — HOLMIUM LASER APPLICATION
Anesthesia: General | Wound class: Clean Contaminated

## 2011-10-18 MED ORDER — SODIUM CHLORIDE 0.9 % IJ SOLN: 3.0000 mL | Freq: Two times a day (BID) | INTRAMUSCULAR | Status: DC

## 2011-10-18 MED ORDER — MIDAZOLAM HCL 2 MG/2ML IJ SOLN
INTRAMUSCULAR | Status: AC
  Administered 2011-10-18: 2 mg via INTRAVENOUS
  Filled 2011-10-18: qty 2

## 2011-10-18 MED ORDER — ONDANSETRON HCL 4 MG/2ML IJ SOLN: 4.0000 mg | Freq: Once | INTRAMUSCULAR | Status: DC | PRN

## 2011-10-18 MED ORDER — ACETAMINOPHEN 650 MG RE SUPP: 650.0000 mg | RECTAL | Status: DC | PRN

## 2011-10-18 MED ORDER — LACTATED RINGERS IV SOLN
INTRAVENOUS | Status: DC
  Administered 2011-10-18: 11:00:00 via INTRAVENOUS

## 2011-10-18 MED ORDER — LIDOCAINE HCL (CARDIAC) 10 MG/ML IV SOLN
INTRAVENOUS | Status: DC | PRN
  Administered 2011-10-18: 10 mg via INTRAVENOUS

## 2011-10-18 MED ORDER — CIPROFLOXACIN IN D5W 400 MG/200ML IV SOLN
400.0000 mg | INTRAVENOUS | Status: AC
  Administered 2011-10-18: 400 mg via INTRAVENOUS

## 2011-10-18 MED ORDER — FENTANYL CITRATE 0.05 MG/ML IJ SOLN: 25.0000 ug | INTRAMUSCULAR | Status: DC | PRN

## 2011-10-18 MED ORDER — PROPOFOL 10 MG/ML IV EMUL
INTRAVENOUS | Status: AC
  Filled 2011-10-18: qty 20

## 2011-10-18 MED ORDER — ONDANSETRON HCL 4 MG/2ML IJ SOLN
INTRAMUSCULAR | Status: AC
  Administered 2011-10-18: 4 mg via INTRAVENOUS
  Filled 2011-10-18: qty 2

## 2011-10-18 MED ORDER — PROPOFOL 10 MG/ML IV EMUL
INTRAVENOUS | Status: DC | PRN
  Administered 2011-10-18: 100 mg via INTRAVENOUS

## 2011-10-18 MED ORDER — SODIUM CHLORIDE 0.9 % IV SOLN
250.0000 mL | INTRAVENOUS | Status: DC | PRN
Start: 2011-10-18 — End: 2011-10-18

## 2011-10-18 MED ORDER — CIPROFLOXACIN IN D5W 400 MG/200ML IV SOLN
INTRAVENOUS | Status: AC
  Administered 2011-10-18: 400 mg via INTRAVENOUS
  Filled 2011-10-18: qty 200

## 2011-10-18 MED ORDER — MIDAZOLAM HCL 2 MG/2ML IJ SOLN
1.0000 mg | INTRAMUSCULAR | Status: AC | PRN
  Administered 2011-10-18: 2 mg via INTRAVENOUS
  Administered 2011-10-18 (×2): 1 mg via INTRAVENOUS

## 2011-10-18 MED ORDER — ONDANSETRON HCL 4 MG/2ML IJ SOLN: 4.0000 mg | Freq: Four times a day (QID) | INTRAMUSCULAR | Status: DC | PRN

## 2011-10-18 MED ORDER — STERILE WATER FOR IRRIGATION IR SOLN
Status: DC | PRN
  Administered 2011-10-18: 3000 mL

## 2011-10-18 MED ORDER — MIDAZOLAM HCL 2 MG/2ML IJ SOLN
INTRAMUSCULAR | Status: AC
  Administered 2011-10-18: 1 mg via INTRAVENOUS
  Filled 2011-10-18: qty 2

## 2011-10-18 MED ORDER — LIDOCAINE HCL (PF) 1 % IJ SOLN
INTRAMUSCULAR | Status: AC
  Filled 2011-10-18: qty 5

## 2011-10-18 MED ORDER — STERILE WATER FOR IRRIGATION IR SOLN
Status: DC | PRN
  Administered 2011-10-18: 1000 mL

## 2011-10-18 MED ORDER — FENTANYL CITRATE 0.05 MG/ML IJ SOLN
INTRAMUSCULAR | Status: AC
  Filled 2011-10-18: qty 2

## 2011-10-18 MED ORDER — ACETAMINOPHEN 325 MG PO TABS: 650.0000 mg | ORAL_TABLET | ORAL | Status: DC | PRN

## 2011-10-18 MED ORDER — ONDANSETRON HCL 4 MG/2ML IJ SOLN
4.0000 mg | Freq: Once | INTRAMUSCULAR | Status: AC
  Administered 2011-10-18: 4 mg via INTRAVENOUS

## 2011-10-18 MED ORDER — SODIUM CHLORIDE 0.9 % IJ SOLN: 3.0000 mL | INTRAMUSCULAR | Status: DC | PRN

## 2011-10-18 MED ORDER — FENTANYL CITRATE 0.05 MG/ML IJ SOLN
INTRAMUSCULAR | Status: DC | PRN
  Administered 2011-10-18: 12.5 ug via INTRAVENOUS
  Administered 2011-10-18: 25 ug via INTRAVENOUS
  Administered 2011-10-18: 12.5 ug via INTRAVENOUS

## 2011-10-18 MED ORDER — SODIUM CHLORIDE 0.9 % IR SOLN
Status: DC | PRN
  Administered 2011-10-18: 3000 mL

## 2011-10-18 SURGICAL SUPPLY — 21 items
BAG DRAIN CYSTO-URO STER (DRAIN) ×2 IMPLANT
BAG HAMPER (MISCELLANEOUS) ×2 IMPLANT
BAG URINE DRAINAGE (UROLOGICAL SUPPLIES) ×1 IMPLANT
CATH FOLEY 2WAY SLVR  5CC 20FR (CATHETERS) ×1
CATH FOLEY 2WAY SLVR 5CC 20FR (CATHETERS) IMPLANT
CLOTH BEACON ORANGE TIMEOUT ST (SAFETY) ×2 IMPLANT
GLOVE BIOGEL PI IND STRL 7.0 (GLOVE) IMPLANT
GLOVE BIOGEL PI INDICATOR 7.0 (GLOVE) ×1
GLOVE ECLIPSE 6.5 STRL STRAW (GLOVE) ×1 IMPLANT
GLOVE ECLIPSE 7.0 STRL STRAW (GLOVE) ×1 IMPLANT
GLOVE SURG SS PI 8.0 STRL IVOR (GLOVE) ×2 IMPLANT
GOWN STRL REIN XL XLG (GOWN DISPOSABLE) ×3 IMPLANT
IV NS IRRIG 3000ML ARTHROMATIC (IV SOLUTION) ×1 IMPLANT
KIT ROOM TURNOVER AP CYSTO (KITS) ×2 IMPLANT
KNIFE DISP COLD (ELECTROSURGICAL) ×1 IMPLANT
LASER FIBER DISP 1000U (UROLOGICAL SUPPLIES) ×1 IMPLANT
PACK CYSTO (CUSTOM PROCEDURE TRAY) ×2 IMPLANT
PAD ARMBOARD 7.5X6 YLW CONV (MISCELLANEOUS) ×4 IMPLANT
TOWEL OR 17X26 4PK STRL BLUE (TOWEL DISPOSABLE) ×2 IMPLANT
WATER STERILE IRR 1000ML POUR (IV SOLUTION) ×2 IMPLANT
WATER STERILE IRR 3000ML UROMA (IV SOLUTION) ×2 IMPLANT

## 2011-10-18 NOTE — Op Note (Signed)
NAME:  [name] [name], [name] [name]                    ACCOUNT NO.:  [id number]  MEDICAL RECORD NO.:  [id number]  LOCATION:  [facility]                          FACILITY:  [facility]  PHYSICIAN:  Excell Seltzer. Annabell Howells, M.D.    DATE OF BIRTH:  1932-10-13  DATE OF PROCEDURE:  10/18/2011 DATE OF DISCHARGE:  10/18/2011                              OPERATIVE REPORT   PROCEDURE:  Cystoscopy with laser incision of bladder neck contracture.  PREOPERATIVE DIAGNOSIS:  Bladder neck contracture.  POSTOPERATIVE DIAGNOSIS:  Bladder neck contracture.  SURGEON:  Excell Seltzer. Annabell Howells, MD  ANESTHESIA:  General.  SPECIMEN:  None.  DRAINS:  A 20-French Foley catheter.  COMPLICATIONS:  None.  INDICATION:  Mr. Goodie is a 78 year old white male who underwent a TURP in the last few months, but has had progressive voiding symptoms and cystoscopy in the office demonstrated a tight bladder neck contracture. It was felt incision of the bladder neck contracture was indicated.  FINDINGS AND PROCEDURE:  He was given Cipro.  He has taken to the operating room where he was positioned awake because of recent back procedures.  Once comfortably positioned, general anesthetic was induced.  His perineum and genitalia were prepped with Betadine solution.  He was draped in usual sterile fashion.  Time-out was performed.  Cystoscopy was performed using the 22-French scope and the 12-degree lens with a laser bridge.  Examination revealed a normal urethra.  There was a mild stricture at the apex, but the scope could past.  The external sphincter was intact.  At the bladder neck, there was about an 8-French bladder neck contracture and a 1000-micron laser fiber was passed through the scope, it was set on an energy level of 1 and frequency of 10 Hz and treatment was initiated with incisions performed at 5, 7, and 12 o'clock to open the bladder neck contracture. Once the cuts were made down to fat, inspection of the bladder revealed severe trabeculation with some  mild mucosal erythema, but no tumors or stones were noted.  Ureteral orifices were unremarkable.  At this point, the bridge was changed, then a Bugbee electrode was then used to gently fulgurated a couple of tiny bleeding points in the incision lines.  At this point, the cystoscope was removed.  Pressure on the bladder produced an excellent stream.  A 20-French Foley catheter was inserted. The balloon was filled with 10 mL of sterile fluid.  The catheter was placed to straight drainage.  The patient was taken down from lithotomy position.  His anesthetic was reversed.  He was moved to the recovery room in stable condition.  There were no complications.     Excell Seltzer. Annabell Howells, M.D.     JJW/MEDQ  D:  10/18/2011  T:  10/18/2011  Job:  [id number]

## 2011-10-18 NOTE — Anesthesia Procedure Notes (Signed)
Procedure Name: LMA Insertion Date/Time: 10/18/2011 1:00 PM Performed by: Franco Nones Pre-anesthesia Checklist: Patient identified, Patient being monitored, Emergency Drugs available, Timeout performed and Suction available Patient Re-evaluated:Patient Re-evaluated prior to inductionOxygen Delivery Method: Circle System Utilized Preoxygenation: Pre-oxygenation with 100% oxygen Intubation Type: IV induction Ventilation: Mask ventilation without difficulty LMA: LMA inserted LMA Size: 4.0 Number of attempts: 1 Placement Confirmation: positive ETCO2 and breath sounds checked- equal and bilateral

## 2011-10-18 NOTE — Anesthesia Preprocedure Evaluation (Addendum)
Anesthesia Evaluation  Patient identified by MRN, date of birth, ID band Patient awake    Reviewed: Allergy & Precautions, H&P , NPO status , Patient's Chart, lab work & pertinent test results  Airway Mallampati: I TM Distance: >3 FB Neck ROM: full    Dental  (+) Teeth Intact and Missing   Pulmonary COPD   Pulmonary exam normal       Cardiovascular hypertension, + dysrhythmias Atrial Fibrillation Rhythm:Irregular Rate:Normal     Neuro/Psych  Headaches,  Neuromuscular disease negative psych ROS   GI/Hepatic Neg liver ROS, hiatal hernia, GERD- (inactive now, has dysphagia not relux)  Medicated,  Endo/Other  negative endocrine ROS  Renal/GU negative Renal ROS     Musculoskeletal   Abdominal   Peds  Hematology   Anesthesia Other Findings   Reproductive/Obstetrics                          Anesthesia Physical Anesthesia Plan  ASA: III  Anesthesia Plan: General   Post-op Pain Management:    Induction: Intravenous  Airway Management Planned: LMA  Additional Equipment:   Intra-op Plan:   Post-operative Plan: Extubation in OR  Informed Consent: I have reviewed the patients History and Physical, chart, labs and discussed the procedure including the risks, benefits and alternatives for the proposed anesthesia with the patient or authorized representative who has indicated his/her understanding and acceptance.     Plan Discussed with:   Anesthesia Plan Comments:         Anesthesia Quick Evaluation

## 2011-10-18 NOTE — Transfer of Care (Signed)
Immediate Anesthesia Transfer of Care Note  Patient: [name] [name]  Procedure(s) Performed: Procedure(s) (LRB): HOLMIUM LASER APPLICATION (N/A) CYSTOSCOPY (N/A)  Patient Location: PACU  Anesthesia Type: General  Level of Consciousness: awake  Airway & Oxygen Therapy: Patient Spontanous Breathing and non-rebreather face mask  Post-op Assessment: Report given to PACU RN, Post -op Vital signs reviewed and stable and Patient moving all extremities  Post vital signs: Reviewed and stable  Complications: No apparent anesthesia complications

## 2011-10-18 NOTE — Discharge Instructions (Addendum)
Cystoscopy (Bladder Exam) A cystoscopy is an examination of your urinary bladder with a cystoscope. A cystoscope is an instrument like a small telescope with strong lights and lenses. It is inserted into the bladder through the urethra (the opening into the bladder) and allows your caregiver to examine the inside of your bladder. The procedure causes little discomfort and can be done in a hospital or office. It is a diagnostic procedure to evaluate the inside of your bladder. It may involve x-rays to further evaluate the ureters or internal aspects of the kidneys. It may aid in the removal of urinary stones  or in taking tissue samples (biopsies) if necessary. The procedure is easier in females because of a shorter urethra. In a male, the procedure must be done through the penis. This often requires more sedation and more time to do the procedure. The procedure usually takes twenty minutes to one half hour for a male and approximately an hour for a male. LET YOUR CAREGIVERS KNOW ABOUT:  Allergies.   Medications taken including herbs, eye drops, over the counter medications, and creams.   Use of steroids (by mouth or creams).   Previous problems with anesthetics or novocaine.   Possibility of pregnancy, if this applies.   History of blood clots (thrombophlebitis).   History of bleeding or blood problems.   Previous surgery, especially where prosthetics have been used like hip or knee replacements, and heart valve replacements.   Other health problems.  BEFORE THE PROCEDURE  You should be present 60 minutes prior to your procedure or as directed.  PROCEDURE During the procedure, you will:  Be assisted by your urologist and a nurse.   Lie on a cystoscopy table with your knees elevated and legs apart and covered with a drape. For women this is the same position as when a pap smear is taken.   Have the urethral area or penis washed and covered with sterile towels.   Have an anesthetic  (numbing) jelly applied to the urethra. This is usually all that is required for females but males may also require sedation.   Have the cystoscope inserted through the urethra and into the bladder. Sterile fluid will flow through the cystoscope and into the bladder. This will expand the bladder and provide clear fluid for the urologist to look through and examine the interior of the bladder.   Be allowed to go home once you are doing well, are stable, and awake if you were given a sedative. If given a sedative, have someone give you a ride home.  AFTER THE PROCEDURE  You may have temporary bleeding and burning on urination.   Drink lots of fluids.  SEEK IMMEDIATE MEDICAL CARE IF:  There is an increase in blood in the urine or if you are passing clots.   You have difficulty in passing your urine   You develop chills and/or an unexplained oral temperature above 102 F (38.9 C).  Your caregiver will discuss your results with you following the procedure. This may be at a later time if you have been sedated. If other testing or biopsies were taken, ask your caregiver how you are to obtain the results. Remember it is your responsibility to get your results. Do not assume everything is normal if you do not hear from your caregiver. Document Released: 06/10/2000 Document Revised: 06/02/2011 Document Reviewed: 04/03/2008 [facility] Patient Information 2012 [place], [place].  Remove catheter at home in the morning.

## 2011-10-18 NOTE — Anesthesia Postprocedure Evaluation (Signed)
Anesthesia Post Note  Patient: [name] [name]  Procedure(s) Performed: Procedure(s) (LRB): HOLMIUM LASER APPLICATION (N/A) CYSTOSCOPY (N/A)  Anesthesia type: General  Patient location: PACU  Post pain: Pain level controlled  Post assessment: Post-op Vital signs reviewed, Patient's Cardiovascular Status Stable, Respiratory Function Stable, Patent Airway, No signs of Nausea or vomiting and Pain level controlled  Last Vitals:  Filed Vitals:   10/18/11 1338  BP: 135/74  Pulse: 63  Temp: 36.4 C  Resp: 12    Post vital signs: Reviewed and stable  Level of consciousness: awake and alert   Complications: No apparent anesthesia complications

## 2011-10-18 NOTE — Interval H&P Note (Signed)
History and Physical Interval Note:  10/18/2011 12:41 PM  [name] [name]  has presented today for surgery, with the diagnosis of bladder neck contracture  The various methods of treatment have been discussed with the patient and family. After consideration of risks, benefits and other options for treatment, the patient has consented to  Procedure(s) (LRB): CYSTOSCOPY/URETHROTOMY (N/A) as a surgical intervention .  The patients' history has been reviewed, patient examined, no change in status, stable for surgery.  I have reviewed the patients' chart and labs.  Questions were answered to the patient's satisfaction.     Bayler Gehrig J

## 2011-10-20 ENCOUNTER — Encounter (HOSPITAL_COMMUNITY): Payer: Self-pay | Admitting: Urology

## 2011-10-28 ENCOUNTER — Ambulatory Visit (INDEPENDENT_AMBULATORY_CARE_PROVIDER_SITE_OTHER): Payer: Medicare Other | Admitting: Urology

## 2011-10-28 DIAGNOSIS — R82998 Other abnormal findings in urine: Secondary | ICD-10-CM

## 2011-10-28 DIAGNOSIS — D090 Carcinoma in situ of bladder: Secondary | ICD-10-CM | POA: Diagnosis not present

## 2011-10-28 DIAGNOSIS — N320 Bladder-neck obstruction: Secondary | ICD-10-CM

## 2011-10-28 DIAGNOSIS — N3941 Urge incontinence: Secondary | ICD-10-CM

## 2011-12-06 DIAGNOSIS — J449 Chronic obstructive pulmonary disease, unspecified: Secondary | ICD-10-CM | POA: Diagnosis not present

## 2011-12-06 DIAGNOSIS — R1084 Generalized abdominal pain: Secondary | ICD-10-CM | POA: Diagnosis not present

## 2011-12-06 DIAGNOSIS — G4700 Insomnia, unspecified: Secondary | ICD-10-CM | POA: Diagnosis not present

## 2011-12-06 DIAGNOSIS — R51 Headache: Secondary | ICD-10-CM | POA: Diagnosis not present

## 2012-01-02 DIAGNOSIS — L259 Unspecified contact dermatitis, unspecified cause: Secondary | ICD-10-CM | POA: Diagnosis not present

## 2012-01-27 ENCOUNTER — Ambulatory Visit (INDEPENDENT_AMBULATORY_CARE_PROVIDER_SITE_OTHER): Payer: Medicare Other | Admitting: Urology

## 2012-01-27 DIAGNOSIS — R82998 Other abnormal findings in urine: Secondary | ICD-10-CM | POA: Diagnosis not present

## 2012-01-27 DIAGNOSIS — N138 Other obstructive and reflux uropathy: Secondary | ICD-10-CM

## 2012-01-27 DIAGNOSIS — N401 Enlarged prostate with lower urinary tract symptoms: Secondary | ICD-10-CM | POA: Diagnosis not present

## 2012-01-27 DIAGNOSIS — N320 Bladder-neck obstruction: Secondary | ICD-10-CM

## 2012-01-27 DIAGNOSIS — N3941 Urge incontinence: Secondary | ICD-10-CM | POA: Diagnosis not present

## 2012-01-27 DIAGNOSIS — Z8551 Personal history of malignant neoplasm of bladder: Secondary | ICD-10-CM | POA: Diagnosis not present

## 2012-02-06 DIAGNOSIS — R609 Edema, unspecified: Secondary | ICD-10-CM | POA: Diagnosis not present

## 2012-02-06 DIAGNOSIS — M949 Disorder of cartilage, unspecified: Secondary | ICD-10-CM | POA: Diagnosis not present

## 2012-02-06 DIAGNOSIS — R0989 Other specified symptoms and signs involving the circulatory and respiratory systems: Secondary | ICD-10-CM | POA: Diagnosis not present

## 2012-02-06 DIAGNOSIS — Z79899 Other long term (current) drug therapy: Secondary | ICD-10-CM | POA: Diagnosis not present

## 2012-02-06 DIAGNOSIS — M810 Age-related osteoporosis without current pathological fracture: Secondary | ICD-10-CM | POA: Diagnosis not present

## 2012-02-06 DIAGNOSIS — I951 Orthostatic hypotension: Secondary | ICD-10-CM | POA: Diagnosis not present

## 2012-02-06 DIAGNOSIS — R0609 Other forms of dyspnea: Secondary | ICD-10-CM | POA: Diagnosis not present

## 2012-02-06 DIAGNOSIS — M899 Disorder of bone, unspecified: Secondary | ICD-10-CM | POA: Diagnosis not present

## 2012-02-06 DIAGNOSIS — I517 Cardiomegaly: Secondary | ICD-10-CM | POA: Diagnosis not present

## 2012-03-29 DIAGNOSIS — J019 Acute sinusitis, unspecified: Secondary | ICD-10-CM | POA: Diagnosis not present

## 2012-04-12 ENCOUNTER — Ambulatory Visit (INDEPENDENT_AMBULATORY_CARE_PROVIDER_SITE_OTHER): Payer: Medicare Other

## 2012-04-12 ENCOUNTER — Ambulatory Visit (INDEPENDENT_AMBULATORY_CARE_PROVIDER_SITE_OTHER): Payer: Medicare Other | Admitting: Orthopedic Surgery

## 2012-04-12 VITALS — BP 100/64 | Ht 69.0 in | Wt 205.0 lb

## 2012-04-12 DIAGNOSIS — Z96649 Presence of unspecified artificial hip joint: Secondary | ICD-10-CM | POA: Insufficient documentation

## 2012-04-12 MED ORDER — HYDROCODONE-ACETAMINOPHEN 5-325 MG PO TABS: 1.0000 | ORAL_TABLET | Freq: Four times a day (QID) | ORAL | Status: DC | PRN

## 2012-04-12 NOTE — Patient Instructions (Addendum)
activities as tolerated  You can take the pain medication as needed

## 2012-04-12 NOTE — Progress Notes (Signed)
Patient ID: [name], male   DOB: 1933/03/30, 78 y.o.   MRN: [id number] Chief Complaint  Patient presents with  . Follow-up    1 year recheck on right total hip replacement. DOS 08-30-10.    C/o back pain   Hip feels good   Leg lengths are equal   Has some hip abductor weakness  Physical Exam(6) GENERAL: normal development   CDV: pulses are normal   Skin: normal  Psychiatric: awake, alert and oriented  Neuro: normal sensation  MSK gait support with cane  1 ROM is normal   X rays normal   activities as tolerated  F/u in 1 yr annual X rays

## 2012-06-27 DIAGNOSIS — D090 Carcinoma in situ of bladder: Secondary | ICD-10-CM

## 2012-06-27 HISTORY — DX: Carcinoma in situ of bladder: D09.0

## 2012-07-05 DIAGNOSIS — H2510 Age-related nuclear cataract, unspecified eye: Secondary | ICD-10-CM | POA: Diagnosis not present

## 2012-07-05 DIAGNOSIS — H538 Other visual disturbances: Secondary | ICD-10-CM | POA: Diagnosis not present

## 2012-07-05 DIAGNOSIS — H1045 Other chronic allergic conjunctivitis: Secondary | ICD-10-CM | POA: Diagnosis not present

## 2012-07-06 ENCOUNTER — Ambulatory Visit (INDEPENDENT_AMBULATORY_CARE_PROVIDER_SITE_OTHER): Payer: Medicare Other | Admitting: Urology

## 2012-07-06 DIAGNOSIS — Z8551 Personal history of malignant neoplasm of bladder: Secondary | ICD-10-CM

## 2012-07-06 DIAGNOSIS — R3915 Urgency of urination: Secondary | ICD-10-CM

## 2012-07-06 DIAGNOSIS — N3941 Urge incontinence: Secondary | ICD-10-CM

## 2012-07-17 DIAGNOSIS — N323 Diverticulum of bladder: Secondary | ICD-10-CM | POA: Diagnosis not present

## 2012-07-17 DIAGNOSIS — N401 Enlarged prostate with lower urinary tract symptoms: Secondary | ICD-10-CM | POA: Diagnosis not present

## 2012-07-17 DIAGNOSIS — N3941 Urge incontinence: Secondary | ICD-10-CM | POA: Diagnosis not present

## 2012-07-17 DIAGNOSIS — D090 Carcinoma in situ of bladder: Secondary | ICD-10-CM | POA: Diagnosis not present

## 2012-07-17 DIAGNOSIS — N3000 Acute cystitis without hematuria: Secondary | ICD-10-CM | POA: Diagnosis not present

## 2012-07-18 ENCOUNTER — Encounter (HOSPITAL_COMMUNITY): Payer: Self-pay | Admitting: Pharmacy Technician

## 2012-07-24 ENCOUNTER — Other Ambulatory Visit: Payer: Self-pay

## 2012-07-24 ENCOUNTER — Encounter (HOSPITAL_COMMUNITY)
Admission: RE | Admit: 2012-07-24 | Discharge: 2012-07-24 | Disposition: A | Discharge status: Home / Self Care | Payer: Medicare Other | Source: Ambulatory Visit | Attending: Ophthalmology | Admitting: Ophthalmology

## 2012-07-24 ENCOUNTER — Encounter (HOSPITAL_COMMUNITY): Payer: Self-pay

## 2012-07-24 DIAGNOSIS — J449 Chronic obstructive pulmonary disease, unspecified: Secondary | ICD-10-CM | POA: Diagnosis not present

## 2012-07-24 DIAGNOSIS — H2510 Age-related nuclear cataract, unspecified eye: Secondary | ICD-10-CM | POA: Diagnosis not present

## 2012-07-24 DIAGNOSIS — I10 Essential (primary) hypertension: Secondary | ICD-10-CM | POA: Diagnosis not present

## 2012-07-24 DIAGNOSIS — Z01812 Encounter for preprocedural laboratory examination: Secondary | ICD-10-CM | POA: Diagnosis not present

## 2012-07-24 DIAGNOSIS — Z01810 Encounter for preprocedural cardiovascular examination: Secondary | ICD-10-CM | POA: Diagnosis not present

## 2012-07-24 DIAGNOSIS — Z79899 Other long term (current) drug therapy: Secondary | ICD-10-CM | POA: Diagnosis not present

## 2012-07-24 MED ORDER — CYCLOPENTOLATE-PHENYLEPHRINE 0.2-1 % OP SOLN
OPHTHALMIC | Status: AC
  Filled 2012-07-24: qty 2

## 2012-07-24 NOTE — Progress Notes (Signed)
07/24/12 1158  OBSTRUCTIVE SLEEP APNEA  Have you ever been diagnosed with sleep apnea through a sleep study? No  Do you snore loudly (loud enough to be heard through closed doors)?  0  Do you often feel tired, fatigued, or sleepy during the daytime? 1  Has anyone observed you stop breathing during your sleep? 0  Do you have, or are you being treated for high blood pressure? 1  BMI more than 35 kg/m2? 0  Age over 77 years old? 1  Neck circumference greater than 40 cm/18 inches? 0  Gender: 1  Obstructive Sleep Apnea Score 4   Score 4 or greater  Results sent to PCP

## 2012-07-24 NOTE — Patient Instructions (Addendum)
Your procedure is scheduled on: 07/30/2012  Report to [facility] at 900  AM.  Call this number if you have problems the morning of surgery: [phone]   Do not eat food or drink liquids :After Midnight.      Take these medicines the morning of surgery with A SIP OF WATER: norco,detrol,verapamil   Do not wear jewelry, make-up or nail polish.  Do not wear lotions, powders, or perfumes.   Do not shave 48 hours prior to surgery.  Do not bring valuables to the hospital.  Contacts, dentures or bridgework may not be worn into surgery.  Leave suitcase in the car. After surgery it may be brought to your room.  For patients admitted to the hospital, checkout time is 11:00 AM the day of discharge.   Patients discharged the day of surgery will not be allowed to drive home.  :     Please read over the following fact sheets that you were given: Coughing and Deep Breathing, Surgical Site Infection Prevention, Anesthesia Post-op Instructions and Care and Recovery After Surgery    Cataract A cataract is a clouding of the lens of the eye. When a lens becomes cloudy, vision is reduced based on the degree and nature of the clouding. Many cataracts reduce vision to some degree. Some cataracts make people more near-sighted as they develop. Other cataracts increase glare. Cataracts that are ignored and become worse can sometimes look white. The white color can be seen through the pupil. CAUSES   Aging. However, cataracts may occur at any age, even in newborns.   Certain drugs.   Trauma to the eye.   Certain diseases such as diabetes.   Specific eye diseases such as chronic inflammation inside the eye or a sudden attack of a rare form of glaucoma.   Inherited or acquired medical problems.  SYMPTOMS   Gradual, progressive drop in vision in the affected eye.   Severe, rapid visual loss. This most often happens when trauma is the cause.  DIAGNOSIS  To detect a cataract, an eye doctor examines the lens.  Cataracts are best diagnosed with an exam of the eyes with the pupils enlarged (dilated) by drops.  TREATMENT  For an early cataract, vision may improve by using different eyeglasses or stronger lighting. If that does not help your vision, surgery is the only effective treatment. A cataract needs to be surgically removed when vision loss interferes with your everyday activities, such as driving, reading, or watching TV. A cataract may also have to be removed if it prevents examination or treatment of another eye problem. Surgery removes the cloudy lens and usually replaces it with a substitute lens (intraocular lens, IOL).  At a time when both you and your doctor agree, the cataract will be surgically removed. If you have cataracts in both eyes, only one is usually removed at a time. This allows the operated eye to heal and be out of danger from any possible problems after surgery (such as infection or poor wound healing). In rare cases, a cataract may be doing damage to your eye. In these cases, your caregiver may advise surgical removal right away. The vast majority of people who have cataract surgery have better vision afterward. HOME CARE INSTRUCTIONS  If you are not planning surgery, you may be asked to do the following:  Use different eyeglasses.   Use stronger or brighter lighting.   Ask your eye doctor about reducing your medicine dose or changing medicines if it  is thought that a medicine caused your cataract. Changing medicines does not make the cataract go away on its own.   Become familiar with your surroundings. Poor vision can lead to injury. Avoid bumping into things on the affected side. You are at a higher risk for tripping or falling.   Exercise extreme care when driving or operating machinery.   Wear sunglasses if you are sensitive to bright light or experiencing problems with glare.  SEEK IMMEDIATE MEDICAL CARE IF:   You have a worsening or sudden vision loss.   You notice  redness, swelling, or increasing pain in the eye.   You have a fever.  Document Released: 06/13/2005 Document Revised: 06/02/2011 Document Reviewed: 02/04/2011 [facility] Patient Information 2012 [place], [place].PATIENT INSTRUCTIONS POST-ANESTHESIA  IMMEDIATELY FOLLOWING SURGERY:  Do not drive or operate machinery for the first twenty four hours after surgery.  Do not make any important decisions for twenty four hours after surgery or while taking narcotic pain medications or sedatives.  If you develop intractable nausea and vomiting or a severe headache please notify your doctor immediately.  FOLLOW-UP:  Please make an appointment with your surgeon as instructed. You do not need to follow up with anesthesia unless specifically instructed to do so.  WOUND CARE INSTRUCTIONS (if applicable):  Keep a dry clean dressing on the anesthesia/puncture wound site if there is drainage.  Once the wound has quit draining you may leave it open to air.  Generally you should leave the bandage intact for twenty four hours unless there is drainage.  If the epidural site drains for more than 36-48 hours please call the anesthesia department.  QUESTIONS?:  Please feel free to call your physician or the hospital operator if you have any questions, and they will be happy to assist you.

## 2012-07-25 NOTE — Progress Notes (Signed)
Late entry: Staff had attempted to get blood work from pt with no luck. After the 3rd unsuccessful attempt, the pt stated that" he was not going to be stuck again. It was just too bad."

## 2012-07-27 NOTE — OR Nursing (Signed)
EKG abnormal Atrial Fibrillation reported to Dr. Jayme Cloud.  We will recheck pt. on arrival to preop.

## 2012-07-30 ENCOUNTER — Encounter (HOSPITAL_COMMUNITY): Payer: Self-pay | Admitting: *Deleted

## 2012-07-30 ENCOUNTER — Ambulatory Visit (HOSPITAL_COMMUNITY)
Admission: RE | Admit: 2012-07-30 | Discharge: 2012-07-30 | Disposition: A | Discharge status: Home / Self Care | Payer: Medicare Other | Source: Ambulatory Visit | Attending: Ophthalmology | Admitting: Ophthalmology

## 2012-07-30 ENCOUNTER — Encounter (HOSPITAL_COMMUNITY): Payer: Self-pay | Admitting: Anesthesiology

## 2012-07-30 ENCOUNTER — Encounter (HOSPITAL_COMMUNITY)
Admission: RE | Disposition: A | Discharge status: Home / Self Care | Payer: Self-pay | Source: Ambulatory Visit | Attending: Ophthalmology

## 2012-07-30 ENCOUNTER — Ambulatory Visit (HOSPITAL_COMMUNITY): Payer: Medicare Other | Admitting: Anesthesiology

## 2012-07-30 DIAGNOSIS — J4489 Other specified chronic obstructive pulmonary disease: Secondary | ICD-10-CM | POA: Insufficient documentation

## 2012-07-30 DIAGNOSIS — J449 Chronic obstructive pulmonary disease, unspecified: Secondary | ICD-10-CM | POA: Diagnosis not present

## 2012-07-30 DIAGNOSIS — Z01810 Encounter for preprocedural cardiovascular examination: Secondary | ICD-10-CM | POA: Insufficient documentation

## 2012-07-30 DIAGNOSIS — Z01812 Encounter for preprocedural laboratory examination: Secondary | ICD-10-CM | POA: Insufficient documentation

## 2012-07-30 DIAGNOSIS — H2510 Age-related nuclear cataract, unspecified eye: Secondary | ICD-10-CM | POA: Insufficient documentation

## 2012-07-30 DIAGNOSIS — Z79899 Other long term (current) drug therapy: Secondary | ICD-10-CM | POA: Diagnosis not present

## 2012-07-30 DIAGNOSIS — H269 Unspecified cataract: Secondary | ICD-10-CM | POA: Diagnosis not present

## 2012-07-30 DIAGNOSIS — I10 Essential (primary) hypertension: Secondary | ICD-10-CM | POA: Diagnosis not present

## 2012-07-30 DIAGNOSIS — H538 Other visual disturbances: Secondary | ICD-10-CM | POA: Diagnosis not present

## 2012-07-30 HISTORY — PX: CATARACT EXTRACTION W/PHACO: SHX586

## 2012-07-30 LAB — POCT I-STAT 4, (NA,K, GLUC, HGB,HCT)
HCT: 37 % — ABNORMAL LOW (ref 39.0–52.0)
Sodium: 141 mEq/L (ref 135–145)

## 2012-07-30 SURGERY — PHACOEMULSIFICATION, CATARACT, WITH IOL INSERTION
Anesthesia: Monitor Anesthesia Care | Site: Eye | Laterality: Left | Wound class: Clean

## 2012-07-30 MED ORDER — MIDAZOLAM HCL 2 MG/2ML IJ SOLN
INTRAMUSCULAR | Status: AC
  Filled 2012-07-30: qty 2

## 2012-07-30 MED ORDER — LIDOCAINE HCL 3.5 % OP GEL
OPHTHALMIC | Status: AC
  Filled 2012-07-30: qty 5

## 2012-07-30 MED ORDER — ACETAMINOPHEN 325 MG PO TABS
ORAL_TABLET | ORAL | Status: AC
  Filled 2012-07-30: qty 1

## 2012-07-30 MED ORDER — GATIFLOXACIN 0.5 % OP SOLN
1.0000 [drp] | Freq: Once | OPHTHALMIC | Status: AC
  Administered 2012-07-30: 1 [drp] via OPHTHALMIC

## 2012-07-30 MED ORDER — NA HYALUR & NA CHOND-NA HYALUR 0.55-0.5 ML IO KIT
PACK | INTRAOCULAR | Status: DC | PRN
  Administered 2012-07-30: 1 via OPHTHALMIC

## 2012-07-30 MED ORDER — TETRACAINE 0.5 % OP SOLN OPTIME - NO CHARGE
OPHTHALMIC | Status: DC | PRN
  Administered 2012-07-30: 1 [drp] via OPHTHALMIC

## 2012-07-30 MED ORDER — BSS IO SOLN
INTRAOCULAR | Status: DC | PRN
  Administered 2012-07-30: 15 mL via INTRAOCULAR

## 2012-07-30 MED ORDER — ONDANSETRON HCL 4 MG/2ML IJ SOLN: 4.0000 mg | Freq: Once | INTRAMUSCULAR | Status: DC | PRN

## 2012-07-30 MED ORDER — EPINEPHRINE HCL 1 MG/ML IJ SOLN
INTRAMUSCULAR | Status: AC
  Filled 2012-07-30: qty 1

## 2012-07-30 MED ORDER — TETRACAINE HCL 0.5 % OP SOLN
OPHTHALMIC | Status: AC
  Filled 2012-07-30: qty 2

## 2012-07-30 MED ORDER — LACTATED RINGERS IV SOLN
INTRAVENOUS | Status: DC
  Administered 2012-07-30: 1000 mL via INTRAVENOUS

## 2012-07-30 MED ORDER — MIDAZOLAM HCL 2 MG/2ML IJ SOLN
1.0000 mg | INTRAMUSCULAR | Status: DC | PRN
  Administered 2012-07-30 (×2): 2 mg via INTRAVENOUS

## 2012-07-30 MED ORDER — CYCLOPENTOLATE-PHENYLEPHRINE 0.2-1 % OP SOLN
1.0000 [drp] | Freq: Once | OPHTHALMIC | Status: AC
  Administered 2012-07-30: 1 [drp] via OPHTHALMIC

## 2012-07-30 MED ORDER — KETOROLAC TROMETHAMINE 0.5 % OP SOLN
1.0000 [drp] | OPHTHALMIC | Status: AC
  Administered 2012-07-30: 1 [drp] via OPHTHALMIC

## 2012-07-30 MED ORDER — LACTATED RINGERS IV SOLN
INTRAVENOUS | Status: DC | PRN
  Administered 2012-07-30: 09:00:00 via INTRAVENOUS

## 2012-07-30 MED ORDER — FENTANYL CITRATE 0.05 MG/ML IJ SOLN: 25.0000 ug | INTRAMUSCULAR | Status: DC | PRN

## 2012-07-30 MED ORDER — LIDOCAINE 3.5 % OP GEL OPTIME - NO CHARGE
OPHTHALMIC | Status: DC | PRN
  Administered 2012-07-30: 1 [drp] via OPHTHALMIC

## 2012-07-30 MED ORDER — ACETAMINOPHEN 325 MG PO TABS
325.0000 mg | ORAL_TABLET | Freq: Once | ORAL | Status: AC
  Administered 2012-07-30: 325 mg via ORAL

## 2012-07-30 MED ORDER — GATIFLOXACIN 0.5 % OP SOLN OPTIME - NO CHARGE
OPHTHALMIC | Status: DC | PRN
  Administered 2012-07-30: 2 [drp] via OPHTHALMIC

## 2012-07-30 MED ORDER — EPINEPHRINE HCL 1 MG/ML IJ SOLN
INTRAOCULAR | Status: DC | PRN
  Administered 2012-07-30: 11:00:00

## 2012-07-30 SURGICAL SUPPLY — 27 items
CAPSULAR TENSION RING-AMO (OPHTHALMIC RELATED) IMPLANT
CLOTH BEACON ORANGE TIMEOUT ST (SAFETY) ×1 IMPLANT
GLOVE BIO SURGEON STRL SZ7.5 (GLOVE) IMPLANT
GLOVE BIOGEL M 6.5 STRL (GLOVE) IMPLANT
GLOVE BIOGEL PI IND STRL 6.5 (GLOVE) IMPLANT
GLOVE BIOGEL PI IND STRL 7.0 (GLOVE) IMPLANT
GLOVE BIOGEL PI INDICATOR 6.5 (GLOVE) ×1
GLOVE BIOGEL PI INDICATOR 7.0 (GLOVE)
GLOVE ECLIPSE 6.5 STRL STRAW (GLOVE) IMPLANT
GLOVE ECLIPSE 7.5 STRL STRAW (GLOVE) IMPLANT
GLOVE EXAM NITRILE LRG STRL (GLOVE) IMPLANT
GLOVE EXAM NITRILE MD LF STRL (GLOVE) ×1 IMPLANT
GLOVE SKINSENSE NS SZ6.5 (GLOVE)
GLOVE SKINSENSE NS SZ7.0 (GLOVE)
GLOVE SKINSENSE STRL SZ6.5 (GLOVE) IMPLANT
GLOVE SKINSENSE STRL SZ7.0 (GLOVE) IMPLANT
INST SET CATARACT ~~LOC~~ (KITS) ×2 IMPLANT
KIT VITRECTOMY (OPHTHALMIC RELATED) IMPLANT
PAD ARMBOARD 7.5X6 YLW CONV (MISCELLANEOUS) ×1 IMPLANT
PROC W NO LENS (INTRAOCULAR LENS)
PROC W SPEC LENS (INTRAOCULAR LENS)
PROCESS W NO LENS (INTRAOCULAR LENS) IMPLANT
PROCESS W SPEC LENS (INTRAOCULAR LENS) IMPLANT
RING MALYGIN (MISCELLANEOUS) IMPLANT
SIGHTPATH CAT PROC W REG LENS (Ophthalmic Related) ×2 IMPLANT
VISCOELASTIC ADDITIONAL (OPHTHALMIC RELATED) IMPLANT
WATER STERILE IRR 250ML POUR (IV SOLUTION) ×1 IMPLANT

## 2012-07-30 NOTE — Anesthesia Postprocedure Evaluation (Signed)
  Anesthesia Post-op Note  Patient: [name] [name]  Procedure(s) Performed: Procedure(s) (LRB): CATARACT EXTRACTION PHACO AND INTRAOCULAR LENS PLACEMENT (IOC) (Left)  Patient Location:  Short Stay  Anesthesia Type: MAC  Level of Consciousness: awake  Airway and Oxygen Therapy: Patient Spontanous Breathing  Post-op Pain: none  Post-op Assessment: Post-op Vital signs reviewed, Patient's Cardiovascular Status Stable, Respiratory Function Stable, Patent Airway, No signs of Nausea or vomiting and Pain level controlled  Post-op Vital Signs: Reviewed and stable  Complications: No apparent anesthesia complications

## 2012-07-30 NOTE — Addendum Note (Signed)
Addendum  created 07/30/12 1158 by Asif Muchow, MD   Modules edited:Anesthesia Attestations    

## 2012-07-30 NOTE — Transfer of Care (Signed)
Immediate Anesthesia Transfer of Care Note  Patient: [name] [name]  Procedure(s) Performed: Procedure(s) (LRB): CATARACT EXTRACTION PHACO AND INTRAOCULAR LENS PLACEMENT (IOC) (Left)  Patient Location: Shortstay  Anesthesia Type: MAC  Level of Consciousness: awake  Airway & Oxygen Therapy: Patient Spontanous Breathing   Post-op Assessment: Report given to PACU RN, Post -op Vital signs reviewed and stable and Patient moving all extremities  Post vital signs: Reviewed and stable  Complications: No apparent anesthesia complications

## 2012-07-30 NOTE — Anesthesia Procedure Notes (Signed)
Procedure Name: MAC Performed by: ANDRAZA, AMY L Pre-anesthesia Checklist: Patient identified, Patient being monitored, Emergency Drugs available, Timeout performed and Suction available Oxygen Delivery Method: Nasal cannula     

## 2012-07-30 NOTE — Anesthesia Preprocedure Evaluation (Signed)
Anesthesia Evaluation  Patient identified by MRN, date of birth, ID band Patient awake    Reviewed: Allergy & Precautions, H&P , NPO status , Patient's Chart, lab work & pertinent test results  Airway Mallampati: I TM Distance: >3 FB Neck ROM: Full    Dental  (+) Teeth Intact   Pulmonary COPD   Pulmonary exam normal       Cardiovascular hypertension, Pt. on medications + Peripheral Vascular Disease + dysrhythmias Atrial Fibrillation Rhythm:Irregular Rate:Normal     Neuro/Psych  Headaches,  Neuromuscular disease negative psych ROS   GI/Hepatic Neg liver ROS, hiatal hernia, GERD-  Medicated,  Endo/Other  negative endocrine ROS  Renal/GU Renal disease     Musculoskeletal  (+) Arthritis -, Osteoarthritis,    Abdominal Normal abdominal exam  (+)   Peds  Hematology negative hematology ROS (+)   Anesthesia Other Findings   Reproductive/Obstetrics                           Anesthesia Physical Anesthesia Plan  ASA: III  Anesthesia Plan: MAC   Post-op Pain Management:    Induction: Intravenous  Airway Management Planned: Nasal Cannula  Additional Equipment:   Intra-op Plan:   Post-operative Plan:   Informed Consent: I have reviewed the patients History and Physical, chart, labs and discussed the procedure including the risks, benefits and alternatives for the proposed anesthesia with the patient or authorized representative who has indicated his/her understanding and acceptance.     Plan Discussed with: CRNA  Anesthesia Plan Comments:         Anesthesia Quick Evaluation

## 2012-07-30 NOTE — Op Note (Signed)
See scanned op note 

## 2012-07-30 NOTE — H&P (Signed)
I have reviewed the pre printed H&P, the patient was re-examined, and I have identified no significant interval changes in the patient's medical condition.  There is no change in the plan of care since the history and physical of record. 

## 2012-07-30 NOTE — Brief Op Note (Signed)
07/30/2012  11:49 AM  PATIENT:  [name] [name]  79 y.o. male  PRE-OPERATIVE DIAGNOSIS:  nuclear cataract left eye  POST-OPERATIVE DIAGNOSIS:  nuclear cataract left eye  PROCEDURE:  Procedure(s): CATARACT EXTRACTION PHACO AND INTRAOCULAR LENS PLACEMENT (IOC)  SURGEON:  Surgeon(s): Susa Simmonds, MD  ASSISTANTS: Valda Lamb, CST   ANESTHESIA STAFF: Marolyn Hammock, CRNA - CRNA Roselie Awkward, MD - Anesthesiologist Franco Nones, CRNA - CRNA  ANESTHESIA:   topical and MAC  REQUESTED LENS POWER: 21.0  LENS IMPLANT INFORMATION: Alcon SN 60 WF  S/n  [id number]  Exp 02/2017  CUMULATIVE DISSIPATED ENERGY:31.66  INDICATIONS:see H&P for specific indications  OP FINDINGS:dense NS  COMPLICATIONS:None  DICTATION #: see scanned op note  PLAN OF CARE: KPE w IOL OS  PATIENT DISPOSITION:  Short Stay

## 2012-07-30 NOTE — Addendum Note (Signed)
Addendum  created 07/30/12 1158 by Roselie Awkward, MD   Modules edited:Anesthesia Attestations

## 2012-07-31 ENCOUNTER — Encounter (HOSPITAL_COMMUNITY): Payer: Self-pay | Admitting: Ophthalmology

## 2012-08-23 ENCOUNTER — Encounter: Payer: Self-pay | Admitting: Orthopedic Surgery

## 2012-08-23 ENCOUNTER — Ambulatory Visit (INDEPENDENT_AMBULATORY_CARE_PROVIDER_SITE_OTHER): Payer: Medicare Other | Admitting: Orthopedic Surgery

## 2012-08-23 ENCOUNTER — Ambulatory Visit (INDEPENDENT_AMBULATORY_CARE_PROVIDER_SITE_OTHER): Payer: Medicare Other

## 2012-08-23 VITALS — BP 130/62 | Ht 69.0 in | Wt 205.0 lb

## 2012-08-23 DIAGNOSIS — M25551 Pain in right hip: Secondary | ICD-10-CM

## 2012-08-23 DIAGNOSIS — M25559 Pain in unspecified hip: Secondary | ICD-10-CM

## 2012-08-23 MED ORDER — HYDROCODONE-ACETAMINOPHEN 5-325 MG PO TABS: 1.0000 | ORAL_TABLET | Freq: Four times a day (QID) | ORAL | Status: DC | PRN

## 2012-08-23 NOTE — Patient Instructions (Addendum)
Stop oxycodone   Take aleve twice a day   NORCO TWICE A DAY AND HYDROCODONE

## 2012-08-23 NOTE — Progress Notes (Signed)
Patient ID: [name] [name], male   DOB: 22-Jul-1932, 79 y.o.   MRN: [id number] Chief Complaint  Patient presents with  . Hip Pain    Right hip pain DOS 08/30/10    Status post right total hip arthroplasty after failed cannulated screw fixation for right hip fracture  Patient presents back right leg pain  No catching or locking he says his legs feel tired when he is walking this includes both legs. He has a lumbar degenerative condition and he also has some compression fractures were treated with vertebroplasty.  However that didn't seem to help him in terms of his back pain and he appears to have symptoms of spinal stenosis  He takes one Aleve a day he did take some Percocet currently takes hydrocodone  Is ambulatory with a walker he has a classic spinal stenotic gait leaning forward on a walker with a grocery cart-like sign  Review of systems he denies saddle anesthesia he denies radicular pain other than the right hip pain and right leg pain which goes to his knee  Frequency none urgency none he occasionally has some difficulty with urination.  Past Medical History  Diagnosis Date  . Hypertension   . Alcohol abuse     stop drinking since 01/2009  . B12 deficiency 9/10    started injection   . COPD (chronic obstructive pulmonary disease)   . Compression fracture   . Venous stasis     chronic   . BPH (benign prostatic hyperplasia)   . Nutcracker esophagus 2002    on EM  . Diverticula of colon     few small scattered in the sigmoid colon  . GERD (gastroesophageal reflux disease)   . H/O hiatal hernia   . Headache   . Cancer DR [facility]    BLADDER CANCER CELLS TX 2 MO AGO  . Chronic kidney disease     RECENT UTI FEW MONTHS AGO TX WITH ANTIBIOTIC  . Atrial fibrillation     off coumadin secondary to fall and subdural  hematoma   . External hemorrhoids 09/2014  . Arthritis    BP 130/62  Ht 5\' 9"  (1.753 m)  Wt 205 lb (92.987 kg)  BMI 30.26 kg/m2 General appearance is normal, the  patient is alert and oriented x3 with normal mood and affect.  He ambulates with a flexed posture of his lumbosacral spine using his walker. He says he has weightbearing pain on the right side. His hip is stable muscle tone is normal range of motion does not cause any pain straight leg raise is negative I checked his left side range of motion was similar without pain skin incision is healed no tenderness over the greater trochanter distal pulses intact normal sensation below the knee  X-ray was obtained  X-ray shows normal position of the hip I reviewed his previous x-rays back to the time of surgery there is no change in the position of the cup for the stem and both are stable with no signs of wear  Although is a complicated differential diagnosis I think he has some spinal stenosis. He has some right radicular symptoms perhaps in the L1 or 2 distribution. His hip appears completely stable without any complicating features  Recommend pain medication and this complicated elderly male with multiple medical problems was not a surgical candidate

## 2012-09-18 ENCOUNTER — Ambulatory Visit (INDEPENDENT_AMBULATORY_CARE_PROVIDER_SITE_OTHER): Payer: Medicare Other | Admitting: Family Medicine

## 2012-09-18 ENCOUNTER — Encounter: Payer: Self-pay | Admitting: Family Medicine

## 2012-09-18 VITALS — BP 126/70 | HR 70 | Ht 72.0 in | Wt 211.4 lb

## 2012-09-18 DIAGNOSIS — M48061 Spinal stenosis, lumbar region without neurogenic claudication: Secondary | ICD-10-CM | POA: Diagnosis not present

## 2012-09-18 DIAGNOSIS — Z8679 Personal history of other diseases of the circulatory system: Secondary | ICD-10-CM

## 2012-09-18 DIAGNOSIS — J4489 Other specified chronic obstructive pulmonary disease: Secondary | ICD-10-CM

## 2012-09-18 DIAGNOSIS — J449 Chronic obstructive pulmonary disease, unspecified: Secondary | ICD-10-CM

## 2012-09-18 DIAGNOSIS — E785 Hyperlipidemia, unspecified: Secondary | ICD-10-CM

## 2012-09-18 DIAGNOSIS — I482 Chronic atrial fibrillation, unspecified: Secondary | ICD-10-CM

## 2012-09-18 DIAGNOSIS — R7301 Impaired fasting glucose: Secondary | ICD-10-CM

## 2012-09-18 DIAGNOSIS — I4891 Unspecified atrial fibrillation: Secondary | ICD-10-CM

## 2012-09-18 DIAGNOSIS — I10 Essential (primary) hypertension: Secondary | ICD-10-CM

## 2012-09-18 DIAGNOSIS — K219 Gastro-esophageal reflux disease without esophagitis: Secondary | ICD-10-CM

## 2012-09-18 NOTE — Progress Notes (Signed)
  Subjective:    Patient ID: [name] [name], male    DOB: Dec 11, 1932, 79 y.o.   MRN: [id number]  Back Pain This is a recurrent problem. The current episode started more than 1 year ago. The problem is unchanged. The pain is present in the lumbar spine. The pain does not radiate. The pain is at a severity of 5/10. The pain is moderate. The symptoms are aggravated by bending. Associated symptoms include bladder incontinence. Pertinent negatives include no abdominal pain. Risk factors include lack of exercise. He has tried nothing for the symptoms.   patient also has history of pruritus. He has considered it is skin mites in the past. Continues to see Dr. Margo Aye intermittently for this. Also history of very significant alcohol abuse. Claims no recent alcohol abuse.    Review of Systems  Gastrointestinal: Negative for abdominal pain.  Genitourinary: Positive for bladder incontinence.  Musculoskeletal: Positive for back pain.       Objective:   Physical Exam  Alert no acute distress. HEENT normal. Lungs clear. Heart irregular regular rate and rhythm. Abdomen benign. Ankles trace edema. Neuro grossly intact.      Assessment & Plan:  Impression #1 back pain discussed. History of compression fractures. #2 hypertension stable. #3 alcohol abuse currently stable per patient. #4 COPD. #5 history of carcinoma in situ of bladder patient states seeing urologist for this. Plan as per orders.

## 2012-09-18 NOTE — Patient Instructions (Signed)
Try to cut down fats in the diet. Also cut down any extra salt. Try to walk every day.

## 2012-09-19 DIAGNOSIS — I10 Essential (primary) hypertension: Secondary | ICD-10-CM | POA: Insufficient documentation

## 2012-09-19 DIAGNOSIS — I482 Chronic atrial fibrillation, unspecified: Secondary | ICD-10-CM | POA: Insufficient documentation

## 2012-09-20 DIAGNOSIS — E785 Hyperlipidemia, unspecified: Secondary | ICD-10-CM | POA: Diagnosis not present

## 2012-09-21 LAB — LIPID PANEL
Cholesterol: 189 mg/dL (ref 0–200)
HDL: 30 mg/dL — ABNORMAL LOW (ref >39)
Total CHOL/HDL Ratio: 6.3 Ratio

## 2012-09-21 LAB — HEPATIC FUNCTION PANEL
Albumin: 3.9 g/dL (ref 3.5–5.2)
Total Bilirubin: 0.3 mg/dL (ref 0.3–1.2)
Total Protein: 6 g/dL (ref 6.0–8.3)

## 2012-09-21 LAB — BASIC METABOLIC PANEL
BUN: 28 mg/dL — ABNORMAL HIGH (ref 6–23)
CO2: 26 mEq/L (ref 19–32)
Calcium: 9.1 mg/dL (ref 8.4–10.5)
Glucose, Bld: 90 mg/dL (ref 70–99)

## 2012-09-23 ENCOUNTER — Encounter: Payer: Self-pay | Admitting: Family Medicine

## 2012-10-05 ENCOUNTER — Ambulatory Visit (INDEPENDENT_AMBULATORY_CARE_PROVIDER_SITE_OTHER): Payer: Medicare Other | Admitting: Urology

## 2012-10-05 DIAGNOSIS — N390 Urinary tract infection, site not specified: Secondary | ICD-10-CM

## 2012-10-05 DIAGNOSIS — N3941 Urge incontinence: Secondary | ICD-10-CM | POA: Diagnosis not present

## 2012-10-05 DIAGNOSIS — N401 Enlarged prostate with lower urinary tract symptoms: Secondary | ICD-10-CM | POA: Diagnosis not present

## 2012-10-05 DIAGNOSIS — R82998 Other abnormal findings in urine: Secondary | ICD-10-CM | POA: Diagnosis not present

## 2012-10-11 ENCOUNTER — Encounter: Payer: Self-pay | Admitting: *Deleted

## 2012-10-19 ENCOUNTER — Ambulatory Visit (INDEPENDENT_AMBULATORY_CARE_PROVIDER_SITE_OTHER): Payer: Medicare Other | Admitting: Urology

## 2012-10-19 DIAGNOSIS — Z8551 Personal history of malignant neoplasm of bladder: Secondary | ICD-10-CM | POA: Diagnosis not present

## 2012-10-19 DIAGNOSIS — N3020 Other chronic cystitis without hematuria: Secondary | ICD-10-CM | POA: Diagnosis not present

## 2012-10-19 DIAGNOSIS — N401 Enlarged prostate with lower urinary tract symptoms: Secondary | ICD-10-CM | POA: Diagnosis not present

## 2012-10-19 DIAGNOSIS — D090 Carcinoma in situ of bladder: Secondary | ICD-10-CM | POA: Diagnosis not present

## 2012-10-19 DIAGNOSIS — N320 Bladder-neck obstruction: Secondary | ICD-10-CM | POA: Diagnosis not present

## 2012-10-19 DIAGNOSIS — R82998 Other abnormal findings in urine: Secondary | ICD-10-CM | POA: Diagnosis not present

## 2012-11-05 NOTE — Patient Instructions (Addendum)
Your procedure is scheduled on: 11/12/2012  Report to [facility] at  700   AM.  Call this number if you have problems the morning of surgery: [phone]   Do not eat food or drink liquids :After Midnight.      Take these medicines the morning of surgery with A SIP OF WATER: norco,detrol, calan   Do not wear jewelry, make-up or nail polish.  Do not wear lotions, powders, or perfumes.   Do not shave 48 hours prior to surgery.  Do not bring valuables to the hospital.  Contacts, dentures or bridgework may not be worn into surgery.  Leave suitcase in the car. After surgery it may be brought to your room.  For patients admitted to the hospital, checkout time is 11:00 AM the day of discharge.   Patients discharged the day of surgery will not be allowed to drive home.  :     Please read over the following fact sheets that you were given: Coughing and Deep Breathing, Surgical Site Infection Prevention, Anesthesia Post-op Instructions and Care and Recovery After Surgery    Cataract A cataract is a clouding of the lens of the eye. When a lens becomes cloudy, vision is reduced based on the degree and nature of the clouding. Many cataracts reduce vision to some degree. Some cataracts make people more near-sighted as they develop. Other cataracts increase glare. Cataracts that are ignored and become worse can sometimes look white. The white color can be seen through the pupil. CAUSES   Aging. However, cataracts may occur at any age, even in newborns.   Certain drugs.   Trauma to the eye.   Certain diseases such as diabetes.   Specific eye diseases such as chronic inflammation inside the eye or a sudden attack of a rare form of glaucoma.   Inherited or acquired medical problems.  SYMPTOMS   Gradual, progressive drop in vision in the affected eye.   Severe, rapid visual loss. This most often happens when trauma is the cause.  DIAGNOSIS  To detect a cataract, an eye doctor examines the lens.  Cataracts are best diagnosed with an exam of the eyes with the pupils enlarged (dilated) by drops.  TREATMENT  For an early cataract, vision may improve by using different eyeglasses or stronger lighting. If that does not help your vision, surgery is the only effective treatment. A cataract needs to be surgically removed when vision loss interferes with your everyday activities, such as driving, reading, or watching TV. A cataract may also have to be removed if it prevents examination or treatment of another eye problem. Surgery removes the cloudy lens and usually replaces it with a substitute lens (intraocular lens, IOL).  At a time when both you and your doctor agree, the cataract will be surgically removed. If you have cataracts in both eyes, only one is usually removed at a time. This allows the operated eye to heal and be out of danger from any possible problems after surgery (such as infection or poor wound healing). In rare cases, a cataract may be doing damage to your eye. In these cases, your caregiver may advise surgical removal right away. The vast majority of people who have cataract surgery have better vision afterward. HOME CARE INSTRUCTIONS  If you are not planning surgery, you may be asked to do the following:  Use different eyeglasses.   Use stronger or brighter lighting.   Ask your eye doctor about reducing your medicine dose or changing  medicines if it is thought that a medicine caused your cataract. Changing medicines does not make the cataract go away on its own.   Become familiar with your surroundings. Poor vision can lead to injury. Avoid bumping into things on the affected side. You are at a higher risk for tripping or falling.   Exercise extreme care when driving or operating machinery.   Wear sunglasses if you are sensitive to bright light or experiencing problems with glare.  SEEK IMMEDIATE MEDICAL CARE IF:   You have a worsening or sudden vision loss.   You notice  redness, swelling, or increasing pain in the eye.   You have a fever.  Document Released: 06/13/2005 Document Revised: 06/02/2011 Document Reviewed: 02/04/2011 [facility] Patient Information 2012 [place].PATIENT INSTRUCTIONS POST-ANESTHESIA  IMMEDIATELY FOLLOWING SURGERY:  Do not drive or operate machinery for the first twenty four hours after surgery.  Do not make any important decisions for twenty four hours after surgery or while taking narcotic pain medications or sedatives.  If you develop intractable nausea and vomiting or a severe headache please notify your doctor immediately.  FOLLOW-UP:  Please make an appointment with your surgeon as instructed. You do not need to follow up with anesthesia unless specifically instructed to do so.  WOUND CARE INSTRUCTIONS (if applicable):  Keep a dry clean dressing on the anesthesia/puncture wound site if there is drainage.  Once the wound has quit draining you may leave it open to air.  Generally you should leave the bandage intact for twenty four hours unless there is drainage.  If the epidural site drains for more than 36-48 hours please call the anesthesia department.  QUESTIONS?:  Please feel free to call your physician or the hospital operator if you have any questions, and they will be happy to assist you.

## 2012-11-06 ENCOUNTER — Encounter (HOSPITAL_COMMUNITY): Payer: Self-pay

## 2012-11-06 ENCOUNTER — Telehealth: Payer: Self-pay | Admitting: Orthopedic Surgery

## 2012-11-06 ENCOUNTER — Encounter (HOSPITAL_COMMUNITY)
Admission: RE | Admit: 2012-11-06 | Discharge: 2012-11-06 | Disposition: A | Discharge status: Home / Self Care | Payer: Medicare Other | Source: Ambulatory Visit | Attending: Ophthalmology | Admitting: Ophthalmology

## 2012-11-06 MED ORDER — CYCLOPENTOLATE-PHENYLEPHRINE 0.2-1 % OP SOLN
OPHTHALMIC | Status: AC
  Filled 2012-11-06: qty 2

## 2012-11-06 NOTE — Telephone Encounter (Signed)
Routing to Dr Harrison 

## 2012-11-06 NOTE — Pre-Procedure Instructions (Signed)
Patient in for PAT for second cataract extraction. Labs from 07/24/2012 shown to Dr Jayme Cloud and these are okay to use.

## 2012-11-06 NOTE — Telephone Encounter (Signed)
Patient called to inquire about "what else can be done about his hip."  His next scheduled appointment, for yearly re-check and Xray is 04/16/13.  Please call patient to advise, home # [phone].

## 2012-11-07 ENCOUNTER — Encounter (HOSPITAL_COMMUNITY): Payer: Self-pay | Admitting: Pharmacy Technician

## 2012-11-07 NOTE — Telephone Encounter (Signed)
im not sure what are the complaints ??? Maybe needs to be seen next week

## 2012-11-12 ENCOUNTER — Ambulatory Visit (HOSPITAL_COMMUNITY): Payer: Medicare Other | Admitting: Anesthesiology

## 2012-11-12 ENCOUNTER — Encounter (HOSPITAL_COMMUNITY)
Admission: RE | Disposition: A | Discharge status: Home / Self Care | Payer: Self-pay | Source: Ambulatory Visit | Attending: Ophthalmology

## 2012-11-12 ENCOUNTER — Encounter (HOSPITAL_COMMUNITY): Payer: Self-pay

## 2012-11-12 ENCOUNTER — Encounter (HOSPITAL_COMMUNITY): Payer: Self-pay | Admitting: Anesthesiology

## 2012-11-12 ENCOUNTER — Ambulatory Visit (HOSPITAL_COMMUNITY)
Admission: RE | Admit: 2012-11-12 | Discharge: 2012-11-12 | Disposition: A | Discharge status: Home / Self Care | Payer: Medicare Other | Source: Ambulatory Visit | Attending: Ophthalmology | Admitting: Ophthalmology

## 2012-11-12 DIAGNOSIS — J449 Chronic obstructive pulmonary disease, unspecified: Secondary | ICD-10-CM | POA: Diagnosis not present

## 2012-11-12 DIAGNOSIS — H538 Other visual disturbances: Secondary | ICD-10-CM | POA: Diagnosis not present

## 2012-11-12 DIAGNOSIS — I10 Essential (primary) hypertension: Secondary | ICD-10-CM | POA: Insufficient documentation

## 2012-11-12 DIAGNOSIS — H2510 Age-related nuclear cataract, unspecified eye: Secondary | ICD-10-CM | POA: Diagnosis not present

## 2012-11-12 DIAGNOSIS — H269 Unspecified cataract: Secondary | ICD-10-CM | POA: Diagnosis not present

## 2012-11-12 DIAGNOSIS — J4489 Other specified chronic obstructive pulmonary disease: Secondary | ICD-10-CM | POA: Insufficient documentation

## 2012-11-12 HISTORY — PX: CATARACT EXTRACTION W/PHACO: SHX586

## 2012-11-12 SURGERY — PHACOEMULSIFICATION, CATARACT, WITH IOL INSERTION
Anesthesia: Monitor Anesthesia Care | Site: Eye | Laterality: Right | Wound class: Clean

## 2012-11-12 MED ORDER — MOXIFLOXACIN HCL 0.5 % OP SOLN - NO CHARGE: 1.0000 [drp] | Freq: Once | OPHTHALMIC | Status: DC

## 2012-11-12 MED ORDER — EPINEPHRINE HCL 1 MG/ML IJ SOLN
INTRAMUSCULAR | Status: AC
  Filled 2012-11-12: qty 1

## 2012-11-12 MED ORDER — LIDOCAINE 3.5 % OP GEL OPTIME - NO CHARGE
OPHTHALMIC | Status: DC | PRN
  Administered 2012-11-12: 2 [drp] via OPHTHALMIC

## 2012-11-12 MED ORDER — MIDAZOLAM HCL 2 MG/2ML IJ SOLN
1.0000 mg | INTRAMUSCULAR | Status: DC | PRN
  Administered 2012-11-12: 2 mg via INTRAVENOUS

## 2012-11-12 MED ORDER — EPINEPHRINE HCL 1 MG/ML IJ SOLN
INTRAOCULAR | Status: DC | PRN
  Administered 2012-11-12: 09:00:00

## 2012-11-12 MED ORDER — TETRACAINE 0.5 % OP SOLN OPTIME - NO CHARGE
OPHTHALMIC | Status: DC | PRN
  Administered 2012-11-12: 2 [drp] via OPHTHALMIC

## 2012-11-12 MED ORDER — CYCLOPENTOLATE-PHENYLEPHRINE 0.2-1 % OP SOLN: 1.0000 [drp] | Freq: Once | OPHTHALMIC | Status: DC

## 2012-11-12 MED ORDER — TETRACAINE HCL 0.5 % OP SOLN
OPHTHALMIC | Status: AC
  Filled 2012-11-12: qty 2

## 2012-11-12 MED ORDER — ONDANSETRON HCL 4 MG/2ML IJ SOLN: 4.0000 mg | Freq: Once | INTRAMUSCULAR | Status: DC | PRN

## 2012-11-12 MED ORDER — KETOROLAC TROMETHAMINE 0.4 % OP SOLN - NO CHARGE: 1.0000 [drp] | Freq: Once | OPHTHALMIC | Status: DC

## 2012-11-12 MED ORDER — FENTANYL CITRATE 0.05 MG/ML IJ SOLN: 25.0000 ug | INTRAMUSCULAR | Status: DC | PRN

## 2012-11-12 MED ORDER — LACTATED RINGERS IV SOLN
INTRAVENOUS | Status: DC
  Administered 2012-11-12: 09:00:00 via INTRAVENOUS

## 2012-11-12 MED ORDER — MIDAZOLAM HCL 2 MG/2ML IJ SOLN
INTRAMUSCULAR | Status: AC
  Filled 2012-11-12: qty 2

## 2012-11-12 MED ORDER — LIDOCAINE HCL 3.5 % OP GEL
OPHTHALMIC | Status: AC
  Filled 2012-11-12: qty 5

## 2012-11-12 MED ORDER — GATIFLOXACIN 0.5 % OP SOLN OPTIME - NO CHARGE: 1.0000 [drp] | Freq: Once | OPHTHALMIC | Status: DC

## 2012-11-12 MED ORDER — NA HYALUR & NA CHOND-NA HYALUR 0.55-0.5 ML IO KIT
PACK | INTRAOCULAR | Status: DC | PRN
  Administered 2012-11-12: 1 via OPHTHALMIC

## 2012-11-12 MED ORDER — BSS IO SOLN
INTRAOCULAR | Status: DC | PRN
  Administered 2012-11-12: 15 mL via INTRAOCULAR

## 2012-11-12 SURGICAL SUPPLY — 27 items
CAPSULAR TENSION RING-AMO (OPHTHALMIC RELATED) IMPLANT
CLOTH BEACON ORANGE TIMEOUT ST (SAFETY) ×1 IMPLANT
GLOVE BIO SURGEON STRL SZ7.5 (GLOVE) IMPLANT
GLOVE BIOGEL M 6.5 STRL (GLOVE) IMPLANT
GLOVE BIOGEL PI IND STRL 6.5 (GLOVE) IMPLANT
GLOVE BIOGEL PI IND STRL 7.0 (GLOVE) IMPLANT
GLOVE BIOGEL PI INDICATOR 6.5 (GLOVE) ×1
GLOVE BIOGEL PI INDICATOR 7.0 (GLOVE) ×1
GLOVE ECLIPSE 6.5 STRL STRAW (GLOVE) IMPLANT
GLOVE ECLIPSE 7.5 STRL STRAW (GLOVE) IMPLANT
GLOVE EXAM NITRILE LRG STRL (GLOVE) IMPLANT
GLOVE EXAM NITRILE MD LF STRL (GLOVE) IMPLANT
GLOVE SKINSENSE NS SZ6.5 (GLOVE)
GLOVE SKINSENSE NS SZ7.0 (GLOVE)
GLOVE SKINSENSE STRL SZ6.5 (GLOVE) IMPLANT
GLOVE SKINSENSE STRL SZ7.0 (GLOVE) IMPLANT
INST SET CATARACT ~~LOC~~ (KITS) ×2 IMPLANT
KIT VITRECTOMY (OPHTHALMIC RELATED) IMPLANT
PAD ARMBOARD 7.5X6 YLW CONV (MISCELLANEOUS) ×1 IMPLANT
PROC W NO LENS (INTRAOCULAR LENS)
PROC W SPEC LENS (INTRAOCULAR LENS)
PROCESS W NO LENS (INTRAOCULAR LENS) IMPLANT
PROCESS W SPEC LENS (INTRAOCULAR LENS) IMPLANT
RING MALYGIN (MISCELLANEOUS) IMPLANT
SIGHTPATH CAT PROC W REG LENS (Ophthalmic Related) ×2 IMPLANT
VISCOELASTIC ADDITIONAL (OPHTHALMIC RELATED) IMPLANT
WATER STERILE IRR 250ML POUR (IV SOLUTION) ×1 IMPLANT

## 2012-11-12 NOTE — OR Nursing (Signed)
Patient did not bring drops from home.  Patient stated he used his  prescribed drops before he left home.  Dr. Lita Mains notified and stated it  was fine to proceed without the drops.  Tetracaine and Akten pulled per orders and sent to OR.

## 2012-11-12 NOTE — H&P (Signed)
I have reviewed the pre printed H&P, the patient was re-examined, and I have identified no significant interval changes in the patient's medical condition.  There is no change in the plan of care since the history and physical of record. 

## 2012-11-12 NOTE — Telephone Encounter (Signed)
Made appointment for 11/13/12 at 11:00 am

## 2012-11-12 NOTE — Op Note (Signed)
See scanned note.

## 2012-11-12 NOTE — Anesthesia Postprocedure Evaluation (Signed)
  Anesthesia Post-op Note  Patient: [name] [name]  Procedure(s) Performed: Procedure(s) (LRB): CATARACT EXTRACTION PHACO AND INTRAOCULAR LENS PLACEMENT (IOC) (Right)  Patient Location:  Short Stay  Anesthesia Type: MAC  Level of Consciousness: awake  Airway and Oxygen Therapy: Patient Spontanous Breathing  Post-op Pain: none  Post-op Assessment: Post-op Vital signs reviewed, Patient's Cardiovascular Status Stable, Respiratory Function Stable, Patent Airway, No signs of Nausea or vomiting and Pain level controlled  Post-op Vital Signs: Reviewed and stable  Complications: No apparent anesthesia complications

## 2012-11-12 NOTE — Anesthesia Procedure Notes (Signed)
Procedure Name: MAC Date/Time: 11/12/2012 8:45 AM Performed by: Franco Nones Pre-anesthesia Checklist: Patient identified, Emergency Drugs available, Suction available, Timeout performed and Patient being monitored Patient Re-evaluated:Patient Re-evaluated prior to inductionOxygen Delivery Method: Nasal Cannula

## 2012-11-12 NOTE — Brief Op Note (Signed)
11/12/2012  9:22 AM  PATIENT:  [name] [name]  79 y.o. male  PRE-OPERATIVE DIAGNOSIS:  nuclear cataract right eye  POST-OPERATIVE DIAGNOSIS:  nuclear cataract right eye  PROCEDURE:  Procedure(s): CATARACT EXTRACTION PHACO AND INTRAOCULAR LENS PLACEMENT (IOC)  SURGEON:  Surgeon(s): Susa Simmonds, MD  ASSISTANTS:  Marya Landry, cst   ANESTHESIA STAFF: Anesthesiologist: Laurene Footman, MD CRNA: Franco Nones, CRNA  ANESTHESIA:   topical and MAC  REQUESTED LENS POWER: 20.0  LENS IMPLANT INFORMATION:   Alcon [id number]  S/n [id number]  Exp 02/2017  CUMULATIVE DISSIPATED ENERGY:18.25  INDICATIONS:see office H&P  OP FINDINGS:dense NS  COMPLICATIONS:None  DICTATION #: see scanned note  PLAN OF CARE: KPE w IOL today  PATIENT DISPOSITION:  Short Stay

## 2012-11-12 NOTE — Transfer of Care (Signed)
Immediate Anesthesia Transfer of Care Note  Patient: [name] [name]  Procedure(s) Performed: Procedure(s) (LRB): CATARACT EXTRACTION PHACO AND INTRAOCULAR LENS PLACEMENT (IOC) (Right)  Patient Location: Shortstay  Anesthesia Type: MAC  Level of Consciousness: awake  Airway & Oxygen Therapy: Patient Spontanous Breathing   Post-op Assessment: Report given to PACU RN, Post -op Vital signs reviewed and stable and Patient moving all extremities  Post vital signs: Reviewed and stable  Complications: No apparent anesthesia complications

## 2012-11-12 NOTE — Anesthesia Preprocedure Evaluation (Signed)
Anesthesia Evaluation  Patient identified by MRN, date of birth, ID band Patient awake    Reviewed: Allergy & Precautions, H&P , NPO status , Patient's Chart, lab work & pertinent test results  Airway Mallampati: I TM Distance: >3 FB Neck ROM: Full    Dental  (+) Teeth Intact   Pulmonary COPD   Pulmonary exam normal       Cardiovascular hypertension, Pt. on medications + Peripheral Vascular Disease + dysrhythmias Atrial Fibrillation Rhythm:Irregular Rate:Normal     Neuro/Psych  Headaches,  Neuromuscular disease negative psych ROS   GI/Hepatic Neg liver ROS, hiatal hernia, GERD-  Medicated,  Endo/Other  negative endocrine ROS  Renal/GU Renal disease     Musculoskeletal  (+) Arthritis -, Osteoarthritis,    Abdominal Normal abdominal exam  (+)   Peds  Hematology negative hematology ROS (+)   Anesthesia Other Findings   Reproductive/Obstetrics                           Anesthesia Physical Anesthesia Plan  ASA: III  Anesthesia Plan: MAC   Post-op Pain Management:    Induction: Intravenous  Airway Management Planned: Nasal Cannula  Additional Equipment:   Intra-op Plan:   Post-operative Plan:   Informed Consent: I have reviewed the patients History and Physical, chart, labs and discussed the procedure including the risks, benefits and alternatives for the proposed anesthesia with the patient or authorized representative who has indicated his/her understanding and acceptance.     Plan Discussed with: CRNA  Anesthesia Plan Comments:         Anesthesia Quick Evaluation

## 2012-11-13 ENCOUNTER — Encounter (HOSPITAL_COMMUNITY): Payer: Self-pay | Admitting: Ophthalmology

## 2012-11-13 ENCOUNTER — Ambulatory Visit (INDEPENDENT_AMBULATORY_CARE_PROVIDER_SITE_OTHER): Payer: Medicare Other | Admitting: Orthopedic Surgery

## 2012-11-13 VITALS — BP 124/64 | Ht 69.0 in | Wt 205.0 lb

## 2012-11-13 DIAGNOSIS — IMO0002 Reserved for concepts with insufficient information to code with codable children: Secondary | ICD-10-CM

## 2012-11-13 DIAGNOSIS — Z96649 Presence of unspecified artificial hip joint: Secondary | ICD-10-CM

## 2012-11-13 DIAGNOSIS — M48061 Spinal stenosis, lumbar region without neurogenic claudication: Secondary | ICD-10-CM

## 2012-11-13 NOTE — Progress Notes (Signed)
Patient ID: [name] [name], male   DOB: [birth year], 79 y.o.   MRN: [id number] Chief Complaint  Patient presents with  . Follow-up    legn pain from pelvis to foot    Encounter Diagnoses  Name Primary?  . Spinal stenosis of lumbar region Yes  . Status post hip replacement   . COMPRESSION FRACTURE, SPINE      The patient comes in today complaining of bilateral leg pain from his pelvis down to his feet with soreness aching decreasing ability to ambulate and back pain  He status post hip fracture which failed internal fixation was treated with hip replacement did well  He is also status post kyphoplasty  Had an MRI in 2012 which showed spinal stenosis of varying levels and multiple compression fractures  He is not having any numbness or tingling but is currently having weakness in his legs  BP 124/64  Ht 5\' 9"  (1.753 m)  Wt 205 lb (92.987 kg)  BMI 30.26 kg/m2 General appearance is normal, the patient is alert and oriented x3 with normal mood and affect. Body habitus is mesomorphic.  He ambulating with a cane he has a flexed spine when he walks he walks slowly he walks with decreased stride length  He has tenderness in his lower back is kyphotic deformity in the lumbosacral junction. His hip range of motion is normal his hip is stable he has good motor strength in his proximal musculature with no atrophy his skin is normal his incision is clean dry and intact he has minimal edema in his legs and he has no groin lymphadenopathy  Impression spinal stenosis with exacerbation  Status post hip replacement stable  Status post compression fracture spine stable  Recommend referral to a spine neurosurgeon for further evaluation and management

## 2012-11-13 NOTE — Patient Instructions (Addendum)
Refer to Dr. Venetia Maxon for follow up back pain and bilateral leg pain

## 2012-11-14 ENCOUNTER — Telehealth: Payer: Self-pay | Admitting: *Deleted

## 2012-11-14 ENCOUNTER — Other Ambulatory Visit: Payer: Self-pay | Admitting: *Deleted

## 2012-11-14 DIAGNOSIS — M48061 Spinal stenosis, lumbar region without neurogenic claudication: Secondary | ICD-10-CM

## 2012-11-14 NOTE — Telephone Encounter (Signed)
Faxed referral and office notes to [facility] (Dr. Venetia Maxon). Awaiting appointment.

## 2012-11-26 ENCOUNTER — Other Ambulatory Visit: Payer: Self-pay | Admitting: *Deleted

## 2012-11-26 DIAGNOSIS — M25551 Pain in right hip: Secondary | ICD-10-CM

## 2012-11-26 MED ORDER — HYDROCODONE-ACETAMINOPHEN 5-325 MG PO TABS: 1.0000 | ORAL_TABLET | Freq: Four times a day (QID) | ORAL | Status: DC | PRN

## 2012-12-17 DIAGNOSIS — S32009A Unspecified fracture of unspecified lumbar vertebra, initial encounter for closed fracture: Secondary | ICD-10-CM | POA: Diagnosis not present

## 2012-12-17 DIAGNOSIS — IMO0002 Reserved for concepts with insufficient information to code with codable children: Secondary | ICD-10-CM | POA: Diagnosis not present

## 2012-12-17 DIAGNOSIS — M546 Pain in thoracic spine: Secondary | ICD-10-CM | POA: Diagnosis not present

## 2012-12-17 DIAGNOSIS — M545 Low back pain: Secondary | ICD-10-CM | POA: Diagnosis not present

## 2012-12-17 DIAGNOSIS — M810 Age-related osteoporosis without current pathological fracture: Secondary | ICD-10-CM | POA: Diagnosis not present

## 2012-12-31 NOTE — Telephone Encounter (Signed)
Patient had appointment with Dr.Stern on 12/17/12

## 2013-01-21 ENCOUNTER — Other Ambulatory Visit: Payer: Self-pay | Admitting: Neurosurgery

## 2013-01-23 ENCOUNTER — Ambulatory Visit
Admission: RE | Admit: 2013-01-23 | Discharge: 2013-01-23 | Disposition: A | Discharge status: Home / Self Care | Payer: Medicare Other | Source: Ambulatory Visit | Attending: Neurosurgery | Admitting: Neurosurgery

## 2013-01-23 DIAGNOSIS — M545 Low back pain: Secondary | ICD-10-CM | POA: Diagnosis not present

## 2013-01-23 DIAGNOSIS — S32009A Unspecified fracture of unspecified lumbar vertebra, initial encounter for closed fracture: Secondary | ICD-10-CM | POA: Diagnosis not present

## 2013-01-23 DIAGNOSIS — M169 Osteoarthritis of hip, unspecified: Secondary | ICD-10-CM | POA: Diagnosis not present

## 2013-01-23 DIAGNOSIS — E669 Obesity, unspecified: Secondary | ICD-10-CM | POA: Diagnosis not present

## 2013-01-23 DIAGNOSIS — M4310 Spondylolisthesis, site unspecified: Secondary | ICD-10-CM | POA: Diagnosis not present

## 2013-01-23 MED ORDER — GADOBENATE DIMEGLUMINE 529 MG/ML IV SOLN
20.0000 mL | Freq: Once | INTRAVENOUS | Status: AC | PRN
  Administered 2013-01-23: 20 mL via INTRAVENOUS

## 2013-02-04 ENCOUNTER — Other Ambulatory Visit: Payer: Medicare Other

## 2013-02-15 ENCOUNTER — Ambulatory Visit (INDEPENDENT_AMBULATORY_CARE_PROVIDER_SITE_OTHER): Payer: Medicare Other | Admitting: Urology

## 2013-02-15 DIAGNOSIS — N320 Bladder-neck obstruction: Secondary | ICD-10-CM

## 2013-02-15 DIAGNOSIS — D090 Carcinoma in situ of bladder: Secondary | ICD-10-CM

## 2013-02-15 DIAGNOSIS — N3020 Other chronic cystitis without hematuria: Secondary | ICD-10-CM | POA: Diagnosis not present

## 2013-02-15 DIAGNOSIS — N401 Enlarged prostate with lower urinary tract symptoms: Secondary | ICD-10-CM | POA: Diagnosis not present

## 2013-02-19 ENCOUNTER — Other Ambulatory Visit: Payer: Self-pay | Admitting: Family Medicine

## 2013-03-08 ENCOUNTER — Telehealth: Payer: Self-pay | Admitting: *Deleted

## 2013-03-08 NOTE — Telephone Encounter (Signed)
Patient called stated he though something was wrong with his heart. He has chest pain off and on and has been taking rolaids. Complaining of shortness of breath, depression, urinating about a quart of fluid, also complaining of hip pain, neck pain, left arm numbness. Advised pt to go directly to the emergency room.

## 2013-03-14 ENCOUNTER — Encounter: Payer: Self-pay | Admitting: Family Medicine

## 2013-03-14 ENCOUNTER — Ambulatory Visit (INDEPENDENT_AMBULATORY_CARE_PROVIDER_SITE_OTHER): Payer: Medicare Other | Admitting: Family Medicine

## 2013-03-14 VITALS — BP 110/70 | Ht 71.0 in | Wt 217.2 lb

## 2013-03-14 DIAGNOSIS — Z23 Encounter for immunization: Secondary | ICD-10-CM

## 2013-03-14 DIAGNOSIS — R0600 Dyspnea, unspecified: Secondary | ICD-10-CM

## 2013-03-14 DIAGNOSIS — R0609 Other forms of dyspnea: Secondary | ICD-10-CM

## 2013-03-14 NOTE — Progress Notes (Signed)
  Subjective:    Patient ID: [name] [name], male    DOB: [birth year], 79 y.o.   MRN: [id number]  Hypertension This is a chronic problem. The current episode started more than 1 year ago. The problem has been gradually improving since onset. The problem is controlled. There are no associated agents to hypertension. There are no known risk factors for coronary artery disease. Treatments tried: verapamil. The current treatment provides significant improvement. There are no compliance problems.     Patient states that he has been having shortness of breath for about 1 month now. Worried about the possibility of CHF  Patient also has been experiencing urinary frequency at night and believes the Detrol LA is not working.  Sees a urologist a regular basis do to carcinoma in situ of the bladder.  Patient also complains of right hip pain and wants to discuss going to a specialist for treatment. Currently seeing Dr. Romeo Apple. Dr. Romeo Apple has started the patient on hydrocodone twice a day. Continues to have low back and hip pain.    Review of Systems No chest pain no back pain ROS otherwise negative positive hip pain    Objective:   Physical Exam  Alert no major distress HEENT normal. Lungs clear but diminished breath sounds heart rhythm regular currently      Assessment & Plan:  Impression COPD possibly worsening discuss. Patient declines further inhalers. #2 dyspnea. #3 history A. fib clinically stable #4 chronic urinary symptoms encouraged to see urologist #5 chronic back and hip symptoms encouraged to continue with Dr. Romeo Apple. Plan flu shot today. Diet exercise discussed. Check every 6 months. WSL

## 2013-03-15 ENCOUNTER — Ambulatory Visit (HOSPITAL_COMMUNITY)
Admission: RE | Admit: 2013-03-15 | Discharge: 2013-03-15 | Disposition: A | Discharge status: Home / Self Care | Payer: Medicare Other | Source: Ambulatory Visit | Attending: Family Medicine | Admitting: Family Medicine

## 2013-03-15 DIAGNOSIS — R0609 Other forms of dyspnea: Secondary | ICD-10-CM | POA: Insufficient documentation

## 2013-03-15 DIAGNOSIS — R0989 Other specified symptoms and signs involving the circulatory and respiratory systems: Secondary | ICD-10-CM | POA: Insufficient documentation

## 2013-03-20 ENCOUNTER — Telehealth: Payer: Self-pay | Admitting: *Deleted

## 2013-03-20 DIAGNOSIS — G8929 Other chronic pain: Secondary | ICD-10-CM

## 2013-03-20 NOTE — Addendum Note (Signed)
Addended by: Metro Kung on: 03/20/2013 11:19 AM   Modules accepted: Orders

## 2013-03-20 NOTE — Telephone Encounter (Signed)
Discussed with pt. He wants to see ortho but not Dr. Venetia Maxon. Referral put in.

## 2013-03-20 NOTE — Telephone Encounter (Signed)
i highly doubt dr Channing Mutters will see him--his pain was mainly hip with chronic low bk. Not neuropathic for a neurosurg to see. We can send to different ortho if pt request

## 2013-03-20 NOTE — Telephone Encounter (Signed)
Requesting referral to Dr. Channing Mutters in [place] for his back pain

## 2013-03-28 ENCOUNTER — Other Ambulatory Visit (HOSPITAL_COMMUNITY): Payer: Self-pay | Admitting: Orthopaedic Surgery

## 2013-03-28 DIAGNOSIS — M542 Cervicalgia: Secondary | ICD-10-CM

## 2013-03-28 DIAGNOSIS — M479 Spondylosis, unspecified: Secondary | ICD-10-CM

## 2013-03-28 DIAGNOSIS — M810 Age-related osteoporosis without current pathological fracture: Secondary | ICD-10-CM

## 2013-03-28 DIAGNOSIS — M545 Low back pain: Secondary | ICD-10-CM | POA: Diagnosis not present

## 2013-04-01 ENCOUNTER — Ambulatory Visit (HOSPITAL_COMMUNITY)
Admission: RE | Admit: 2013-04-01 | Discharge: 2013-04-01 | Disposition: A | Discharge status: Home / Self Care | Payer: Medicare Other | Source: Ambulatory Visit | Attending: Orthopaedic Surgery | Admitting: Orthopaedic Surgery

## 2013-04-01 DIAGNOSIS — M479 Spondylosis, unspecified: Secondary | ICD-10-CM

## 2013-04-01 DIAGNOSIS — R209 Unspecified disturbances of skin sensation: Secondary | ICD-10-CM | POA: Diagnosis not present

## 2013-04-01 DIAGNOSIS — M542 Cervicalgia: Secondary | ICD-10-CM | POA: Diagnosis not present

## 2013-04-01 DIAGNOSIS — M4802 Spinal stenosis, cervical region: Secondary | ICD-10-CM | POA: Diagnosis not present

## 2013-04-01 DIAGNOSIS — M5020 Other cervical disc displacement, unspecified cervical region: Secondary | ICD-10-CM | POA: Diagnosis not present

## 2013-04-03 ENCOUNTER — Ambulatory Visit (HOSPITAL_COMMUNITY)
Admission: RE | Admit: 2013-04-03 | Discharge: 2013-04-03 | Disposition: A | Discharge status: Home / Self Care | Payer: Medicare Other | Source: Ambulatory Visit | Attending: Orthopaedic Surgery | Admitting: Orthopaedic Surgery

## 2013-04-03 DIAGNOSIS — M818 Other osteoporosis without current pathological fracture: Secondary | ICD-10-CM | POA: Insufficient documentation

## 2013-04-03 DIAGNOSIS — M810 Age-related osteoporosis without current pathological fracture: Secondary | ICD-10-CM | POA: Diagnosis not present

## 2013-04-03 DIAGNOSIS — M479 Spondylosis, unspecified: Secondary | ICD-10-CM

## 2013-04-03 DIAGNOSIS — M542 Cervicalgia: Secondary | ICD-10-CM

## 2013-04-04 DIAGNOSIS — M542 Cervicalgia: Secondary | ICD-10-CM | POA: Diagnosis not present

## 2013-04-04 DIAGNOSIS — M545 Low back pain: Secondary | ICD-10-CM | POA: Diagnosis not present

## 2013-04-16 ENCOUNTER — Encounter: Payer: Self-pay | Admitting: Orthopedic Surgery

## 2013-04-16 ENCOUNTER — Ambulatory Visit: Payer: Medicare Other | Admitting: Orthopedic Surgery

## 2013-04-22 ENCOUNTER — Ambulatory Visit (INDEPENDENT_AMBULATORY_CARE_PROVIDER_SITE_OTHER): Payer: Medicare Other | Admitting: Family Medicine

## 2013-04-22 ENCOUNTER — Encounter: Payer: Self-pay | Admitting: Family Medicine

## 2013-04-22 VITALS — BP 120/82 | Ht 71.0 in | Wt 218.0 lb

## 2013-04-22 DIAGNOSIS — M810 Age-related osteoporosis without current pathological fracture: Secondary | ICD-10-CM

## 2013-04-22 DIAGNOSIS — R079 Chest pain, unspecified: Secondary | ICD-10-CM

## 2013-04-22 DIAGNOSIS — I4891 Unspecified atrial fibrillation: Secondary | ICD-10-CM

## 2013-04-22 DIAGNOSIS — K644 Residual hemorrhoidal skin tags: Secondary | ICD-10-CM | POA: Diagnosis not present

## 2013-04-22 DIAGNOSIS — I482 Chronic atrial fibrillation, unspecified: Secondary | ICD-10-CM

## 2013-04-22 MED ORDER — ALENDRONATE SODIUM 70 MG PO TABS: 70.0000 mg | ORAL_TABLET | ORAL | Status: DC

## 2013-04-22 NOTE — Progress Notes (Signed)
  Subjective:    Patient ID: [name] [name], male    DOB: 02/18/1933, 79 y.o.   MRN: [id number]  HPIDiscuss treatment for osteoporosis. He is taking calcium and Vit D.   Discuss testosterone level. Patient wonders if it should be checked  Pain in left arm and left rib cage about 1 week ago. Shooting pain that comes and goes. Shortness of breath.  Some pain in low back, sharp at times,.  Shooting pains on left side. Transient no shortness of breath.  History of compression fractures. Orthopedist recommended therapy for confirmed osteoporosis   Review of Systems No headache no abdominal pain some chest pain sharp ROS otherwise negative    Objective:   Physical Exam  Alert no apparent distress talkative. HEENT normal. Pedal stable. Lungs clear. Heart regular in rhythm. Chest wall nontender  EKG atrial fibrillation good control    Assessment & Plan:  Impression #1 chest pain non-cardiac #2 osteoporosis discuss patient not a candidate for testosterone supplement #3 atrial fibrillation discussed #4 hypertension good control plan initiate Fosamax. Proper use discussed. Maintain vitamin D. and calcium. Diet exercise discussed 25 minutes spent in discussion primarily. WSL

## 2013-04-23 DIAGNOSIS — M810 Age-related osteoporosis without current pathological fracture: Secondary | ICD-10-CM | POA: Insufficient documentation

## 2013-05-14 ENCOUNTER — Encounter: Payer: Self-pay | Admitting: Orthopedic Surgery

## 2013-05-14 ENCOUNTER — Ambulatory Visit (INDEPENDENT_AMBULATORY_CARE_PROVIDER_SITE_OTHER): Payer: Medicare Other | Admitting: Orthopedic Surgery

## 2013-05-14 ENCOUNTER — Ambulatory Visit (INDEPENDENT_AMBULATORY_CARE_PROVIDER_SITE_OTHER): Payer: Medicare Other

## 2013-05-14 DIAGNOSIS — Z96649 Presence of unspecified artificial hip joint: Secondary | ICD-10-CM

## 2013-05-14 DIAGNOSIS — M48061 Spinal stenosis, lumbar region without neurogenic claudication: Secondary | ICD-10-CM

## 2013-05-14 NOTE — Patient Instructions (Addendum)
Arrange chronic pain management FOR SPINAL STENOSIS  in [place] Follow as needed

## 2013-05-14 NOTE — Progress Notes (Signed)
Patient ID: [name] [name], male   DOB: August 17, 1932, 79 y.o.   MRN: [id number]  Chief Complaint  Patient presents with  . Hip Problem    DOS 35/12; ANNUAL FOLLOW UP   . Back Problem    H/O SPINAL STENOSIS     Annual followup status post right total hip arthroplasty after failed cannulated screw fixation for femoral neck fracture any also has a long-standing diagnosis of spinal stenosis. His spinal stenosis has been worked up and he is followed by Dr. Venetia Maxon and Dr. Ophelia Charter  He also has chronic pain in his back and legs which occurs after walking. He uses a cane to support his gait  His hip replacement is functioning very well.  Review of systems only related to back symptoms with bilateral leg pain radiating with ambulation relieved by back flexion  He is awake and alert he is oriented x3 his mood is pleasant his gait is supported by a cane he has a abnormal spinal contour normal leg lengths hip to ankle hip is stable no pain with internal rotation of the hip  Today's x-ray shows a stable prosthesis  Encounter Diagnoses  Name Primary?  . S/P total hip arthroplasty Yes  . Spinal stenosis of lumbar region     Plan is for chronic pain management in [place]  Followup as needed

## 2013-05-15 ENCOUNTER — Encounter: Payer: Self-pay | Admitting: Family Medicine

## 2013-05-17 ENCOUNTER — Ambulatory Visit (INDEPENDENT_AMBULATORY_CARE_PROVIDER_SITE_OTHER): Payer: Medicare Other | Admitting: Urology

## 2013-05-17 ENCOUNTER — Encounter (INDEPENDENT_AMBULATORY_CARE_PROVIDER_SITE_OTHER): Payer: Self-pay

## 2013-05-17 DIAGNOSIS — Z8551 Personal history of malignant neoplasm of bladder: Secondary | ICD-10-CM

## 2013-05-17 DIAGNOSIS — N320 Bladder-neck obstruction: Secondary | ICD-10-CM

## 2013-05-17 DIAGNOSIS — N401 Enlarged prostate with lower urinary tract symptoms: Secondary | ICD-10-CM | POA: Diagnosis not present

## 2013-05-17 DIAGNOSIS — N3020 Other chronic cystitis without hematuria: Secondary | ICD-10-CM | POA: Diagnosis not present

## 2013-05-17 DIAGNOSIS — D090 Carcinoma in situ of bladder: Secondary | ICD-10-CM | POA: Diagnosis not present

## 2013-05-20 ENCOUNTER — Telehealth: Payer: Self-pay | Admitting: *Deleted

## 2013-05-20 ENCOUNTER — Other Ambulatory Visit: Payer: Self-pay | Admitting: *Deleted

## 2013-05-20 DIAGNOSIS — G894 Chronic pain syndrome: Secondary | ICD-10-CM

## 2013-05-20 NOTE — Telephone Encounter (Signed)
Referral and office notes faxed to [facility]. Awaiting appointment. 

## 2013-06-12 ENCOUNTER — Ambulatory Visit (INDEPENDENT_AMBULATORY_CARE_PROVIDER_SITE_OTHER): Payer: Medicare Other | Admitting: Family Medicine

## 2013-06-12 ENCOUNTER — Encounter: Payer: Self-pay | Admitting: Family Medicine

## 2013-06-12 VITALS — BP 118/70 | Temp 97.7°F | Wt 211.4 lb

## 2013-06-12 DIAGNOSIS — R600 Localized edema: Secondary | ICD-10-CM

## 2013-06-12 DIAGNOSIS — I8310 Varicose veins of unspecified lower extremity with inflammation: Secondary | ICD-10-CM | POA: Diagnosis not present

## 2013-06-12 DIAGNOSIS — I872 Venous insufficiency (chronic) (peripheral): Secondary | ICD-10-CM

## 2013-06-12 DIAGNOSIS — R739 Hyperglycemia, unspecified: Secondary | ICD-10-CM

## 2013-06-12 DIAGNOSIS — R7309 Other abnormal glucose: Secondary | ICD-10-CM | POA: Diagnosis not present

## 2013-06-12 DIAGNOSIS — R609 Edema, unspecified: Secondary | ICD-10-CM | POA: Diagnosis not present

## 2013-06-12 LAB — POCT GLYCOSYLATED HEMOGLOBIN (HGB A1C): Hemoglobin A1C: 5.6

## 2013-06-12 NOTE — Progress Notes (Signed)
   Subjective:    Patient ID: [name] [name], male    DOB: 1933/01/27, 79 y.o.   MRN: [id number]  HPI Patient is here today b/c his feet are red and swollen. He said he noticed it 3 weeks ago.  Pt sleeps in recliner No changes C/o numb in toes  Review of Systems  Constitutional: Negative for fever, chills, diaphoresis and fatigue.  HENT: Negative for congestion.   Respiratory: Negative for cough, choking, chest tightness and shortness of breath.   Cardiovascular: Negative for chest pain.  Gastrointestinal: Negative for abdominal pain.  Endocrine: Negative for heat intolerance.  Genitourinary: Negative for flank pain.  Neurological: Positive for numbness. Negative for tremors, weakness and light-headedness.  Hematological: Negative for adenopathy.  Psychiatric/Behavioral: Negative for agitation.       Objective:   Physical Exam  Vitals reviewed. Constitutional: He appears well-developed and well-nourished.  Neck: No thyromegaly present.  Cardiovascular: Normal rate and normal heart sounds.   No murmur heard. Pulmonary/Chest: Effort normal and breath sounds normal. No respiratory distress.  Musculoskeletal: Normal range of motion. He exhibits edema.  Lymphadenopathy:    He has no cervical adenopathy.  Skin: Skin is warm. No rash noted. No erythema.    Pt very interesting but took 25 minutes to work through [employer] and findings and to discuss it      Assessment & Plan:  No chf Venous stasis on exam  slight pedal edema-depenant needs to sleep with feet level with heart No diabetes  numbness to be followed

## 2013-06-14 ENCOUNTER — Ambulatory Visit (INDEPENDENT_AMBULATORY_CARE_PROVIDER_SITE_OTHER): Payer: Medicare Other | Admitting: Urology

## 2013-06-14 DIAGNOSIS — N3020 Other chronic cystitis without hematuria: Secondary | ICD-10-CM

## 2013-06-14 DIAGNOSIS — R300 Dysuria: Secondary | ICD-10-CM | POA: Diagnosis not present

## 2013-06-14 DIAGNOSIS — N3941 Urge incontinence: Secondary | ICD-10-CM | POA: Diagnosis not present

## 2013-06-14 DIAGNOSIS — D090 Carcinoma in situ of bladder: Secondary | ICD-10-CM | POA: Diagnosis not present

## 2013-08-13 ENCOUNTER — Ambulatory Visit: Payer: Medicare Other | Admitting: Family Medicine

## 2013-08-16 ENCOUNTER — Ambulatory Visit: Payer: Medicare Other | Admitting: Family Medicine

## 2013-08-16 ENCOUNTER — Ambulatory Visit: Payer: Medicare Other | Admitting: Urology

## 2013-08-20 ENCOUNTER — Encounter: Payer: Self-pay | Admitting: Family Medicine

## 2013-08-20 ENCOUNTER — Ambulatory Visit (INDEPENDENT_AMBULATORY_CARE_PROVIDER_SITE_OTHER): Payer: Medicare Other | Admitting: Family Medicine

## 2013-08-20 VITALS — BP 118/68 | Ht 71.0 in | Wt 211.8 lb

## 2013-08-20 DIAGNOSIS — Z79899 Other long term (current) drug therapy: Secondary | ICD-10-CM

## 2013-08-20 DIAGNOSIS — I482 Chronic atrial fibrillation, unspecified: Secondary | ICD-10-CM

## 2013-08-20 DIAGNOSIS — M48061 Spinal stenosis, lumbar region without neurogenic claudication: Secondary | ICD-10-CM

## 2013-08-20 DIAGNOSIS — I10 Essential (primary) hypertension: Secondary | ICD-10-CM | POA: Diagnosis not present

## 2013-08-20 DIAGNOSIS — Z8679 Personal history of other diseases of the circulatory system: Secondary | ICD-10-CM

## 2013-08-20 DIAGNOSIS — K219 Gastro-esophageal reflux disease without esophagitis: Secondary | ICD-10-CM

## 2013-08-20 DIAGNOSIS — E782 Mixed hyperlipidemia: Secondary | ICD-10-CM

## 2013-08-20 DIAGNOSIS — I4891 Unspecified atrial fibrillation: Secondary | ICD-10-CM | POA: Diagnosis not present

## 2013-08-20 MED ORDER — TRIAMTERENE-HCTZ 37.5-25 MG PO CAPS: ORAL_CAPSULE | ORAL | Status: DC

## 2013-08-20 MED ORDER — LEVOFLOXACIN 500 MG PO TABS: 500.0000 mg | ORAL_TABLET | Freq: Every day | ORAL | Status: DC

## 2013-08-20 NOTE — Progress Notes (Signed)
   Subjective:    Patient ID: [name] [name], male    DOB: 09/16/1932, 80 y.o.   MRN: [id number]  HPIMed check up.   Swelling in both feet. Started 6 months to one year. Review of chart reveals patient has had this addressed before. Workup via blood work. Felt to be venous stasis.  Claims compliance with blood pressure medication.  Recent cough and congestion. Progressive over the past week. Productive at times.  Ongoing hip pain post hip replacement.  Thankfully no alcohol use at this time.  Claims compliance with Fosamax along with calcium and vitamin D supplement.  Right hip pain. Has had hip replacement in 2012.      Review of Systems No headache no chest pain no abdominal pain no change about habits no blood in stool ROS otherwise negative    Objective:   Physical Exam  Alert vitals stable no acute distress HEENT slight nasal congestion. Lungs right basilar crackles. No wheezes no tachypnea left base lungs stable. Heart rhythm currently stable ankles trace edema.      Assessment & Plan:  Impression 1 bronchitis versus "walking pneumonia" discussed with patient #2 venous stasis discussed patient would like to use fluid pill when necessary. #3 hypertension stable #4 history of atrial fibrillation stable. #5 osteoporosis clinically stable plan maintain same meds. Appropriate blood work. Levaquin daily 10 days. Recheck in for 6 months. Further recommendations based on blood work. WSL

## 2013-08-21 DIAGNOSIS — Z79899 Other long term (current) drug therapy: Secondary | ICD-10-CM | POA: Diagnosis not present

## 2013-08-21 DIAGNOSIS — E782 Mixed hyperlipidemia: Secondary | ICD-10-CM | POA: Diagnosis not present

## 2013-08-21 LAB — BASIC METABOLIC PANEL
BUN: 33 mg/dL — ABNORMAL HIGH (ref 6–23)
CO2: 26 meq/L (ref 19–32)
Calcium: 9.5 mg/dL (ref 8.4–10.5)
Chloride: 108 mEq/L (ref 96–112)
Creat: 0.86 mg/dL (ref 0.50–1.35)
GLUCOSE: 87 mg/dL (ref 70–99)
POTASSIUM: 4.9 meq/L (ref 3.5–5.3)
SODIUM: 142 meq/L (ref 135–145)

## 2013-08-21 LAB — LIPID PANEL
CHOLESTEROL: 184 mg/dL (ref 0–200)
HDL: 38 mg/dL — ABNORMAL LOW (ref >39)
LDL Cholesterol: 114 mg/dL — ABNORMAL HIGH (ref 0–99)
Total CHOL/HDL Ratio: 4.8 Ratio
Triglycerides: 161 mg/dL — ABNORMAL HIGH (ref <150)
VLDL: 32 mg/dL (ref 0–40)

## 2013-08-21 LAB — HEPATIC FUNCTION PANEL
ALT: <8 U/L (ref 0–53)
AST: 9 U/L (ref 0–37)
Albumin: 4 g/dL (ref 3.5–5.2)
Alkaline Phosphatase: 71 U/L (ref 39–117)
BILIRUBIN DIRECT: 0.1 mg/dL (ref 0.0–0.3)
BILIRUBIN TOTAL: 0.4 mg/dL (ref 0.2–1.2)
Indirect Bilirubin: 0.3 mg/dL (ref 0.2–1.2)
Total Protein: 6.1 g/dL (ref 6.0–8.3)

## 2013-08-25 ENCOUNTER — Encounter: Payer: Self-pay | Admitting: Family Medicine

## 2013-10-02 DIAGNOSIS — H02109 Unspecified ectropion of unspecified eye, unspecified eyelid: Secondary | ICD-10-CM | POA: Diagnosis not present

## 2013-10-02 DIAGNOSIS — H538 Other visual disturbances: Secondary | ICD-10-CM | POA: Diagnosis not present

## 2013-10-10 ENCOUNTER — Encounter: Payer: Self-pay | Admitting: Orthopedic Surgery

## 2013-10-10 ENCOUNTER — Ambulatory Visit (INDEPENDENT_AMBULATORY_CARE_PROVIDER_SITE_OTHER): Payer: Medicare Other | Admitting: Orthopedic Surgery

## 2013-10-10 VITALS — BP 163/93 | Ht 71.0 in | Wt 211.0 lb

## 2013-10-10 DIAGNOSIS — M48061 Spinal stenosis, lumbar region without neurogenic claudication: Secondary | ICD-10-CM

## 2013-10-10 DIAGNOSIS — Z96649 Presence of unspecified artificial hip joint: Secondary | ICD-10-CM | POA: Diagnosis not present

## 2013-10-10 DIAGNOSIS — G894 Chronic pain syndrome: Secondary | ICD-10-CM | POA: Diagnosis not present

## 2013-10-10 NOTE — Progress Notes (Signed)
Patient ID: Ellie LunchFred M Krzyzanowski, male   DOB: 08/19/1932, 80 y.o.   MRN: [id number]   Martine comes back in today saying that his right hip pain is no better  We sent him to pain management and unfortunately did not go because he couldn't get his x-rays brought over to the pain management Center. We gave him instructions again on how to do that  His pain symptoms are radicular in nature they start in his lumbar spine radiating across his hip and into his right leg causing severe pain and causing him to walk with his lumbosacral junction in a flexed position  However, I reevaluated and reexamined his hip anyway status post replacement  No tenderness or swelling over the incision no redness there. Hip flexion is excellent pain-free range of motion in the hip he has some limitations in abduction and adduction muscle tone normal skin intact. For comparison left hip this checked as well. No pain there flexion past 90 no instability no motor dysfunction    Encounter Diagnoses  Name Primary?  . S/P total hip arthroplasty   . Chronic pain syndrome Yes  . Spinal stenosis of lumbar region     Referred to pain management again l Note last x-ray of the hip showed a stable implant

## 2013-10-10 NOTE — Patient Instructions (Signed)
He has been referred to pain management 

## 2013-10-23 ENCOUNTER — Telehealth: Payer: Self-pay | Admitting: *Deleted

## 2013-10-23 NOTE — Telephone Encounter (Signed)
Patient called requesting pain medication. I advised patient that we had sent a referral for him to pain management. I spoke with patient at length trying to help him get the appointment with Dr. Robynn PaneTool. I also called Dr. Nehemiah Massed Tool's office and spoke with them as to what they needed before scheduling him an appointment. They are waiting for notes from Dr. Venetia MaxonStern. I advised the patient of this and tried to help him understand what he needed to do. The patient also advised me that that he had another MRI ordered by Dr. Rulon AbideMark Yeatts, so I advised he would need to get all the records sent to Dr. Nehemiah Massed Tool's office so they could schedule his appointment. The patient seemed confused, so I offered if he had a family member he would like me to explain all this to, he could have them call me.

## 2013-10-26 ENCOUNTER — Other Ambulatory Visit: Payer: Self-pay | Admitting: Family Medicine

## 2013-11-05 ENCOUNTER — Ambulatory Visit (INDEPENDENT_AMBULATORY_CARE_PROVIDER_SITE_OTHER): Payer: Medicare Other | Admitting: Family Medicine

## 2013-11-05 ENCOUNTER — Encounter: Payer: Self-pay | Admitting: Family Medicine

## 2013-11-05 VITALS — BP 104/68 | Temp 98.0°F | Ht 71.0 in | Wt 219.0 lb

## 2013-11-05 DIAGNOSIS — K219 Gastro-esophageal reflux disease without esophagitis: Secondary | ICD-10-CM | POA: Diagnosis not present

## 2013-11-05 DIAGNOSIS — I10 Essential (primary) hypertension: Secondary | ICD-10-CM

## 2013-11-05 DIAGNOSIS — I4891 Unspecified atrial fibrillation: Secondary | ICD-10-CM | POA: Diagnosis not present

## 2013-11-05 DIAGNOSIS — M48061 Spinal stenosis, lumbar region without neurogenic claudication: Secondary | ICD-10-CM

## 2013-11-05 DIAGNOSIS — G894 Chronic pain syndrome: Secondary | ICD-10-CM

## 2013-11-05 DIAGNOSIS — K644 Residual hemorrhoidal skin tags: Secondary | ICD-10-CM | POA: Diagnosis not present

## 2013-11-05 DIAGNOSIS — I482 Chronic atrial fibrillation, unspecified: Secondary | ICD-10-CM

## 2013-11-05 DIAGNOSIS — Z8679 Personal history of other diseases of the circulatory system: Secondary | ICD-10-CM

## 2013-11-05 MED ORDER — HYDROCODONE-ACETAMINOPHEN 5-325 MG PO TABS: ORAL_TABLET | ORAL | Status: DC

## 2013-11-05 MED ORDER — MUPIROCIN 2 % EX OINT: TOPICAL_OINTMENT | CUTANEOUS | Status: DC

## 2013-11-05 MED ORDER — AMOXICILLIN-POT CLAVULANATE 875-125 MG PO TABS: 1.0000 | ORAL_TABLET | Freq: Two times a day (BID) | ORAL | Status: DC

## 2013-11-05 MED ORDER — TRIAMCINOLONE ACETONIDE 0.1 % EX CREA: TOPICAL_CREAM | CUTANEOUS | Status: DC

## 2013-11-05 NOTE — Progress Notes (Signed)
   Subjective:    Patient ID: [name] [name], male    DOB: 01/02/1933, 80 y.o.   MRN: [id number]  Rash This is a new problem. The current episode started 1 to 4 weeks ago. The affected locations include the right lower leg, left lower leg, left foot and right foot. The rash is characterized by itchiness.   Requesting pain med for hip. Had hip replacement March 2012. Taking naproxen. Ongoing considerable low back pain. Also considerable hip pain. Tramadol not covering it. Cannot take Doxy codone but thinks he can take hydrocodone codone.  No longer abusing alcohol. Next  History of a true fibrillation currently compliant with aspirin. Next  History of hypertension. Compliant with medications. Watching salt in diet.   Review of Systems  Skin: Positive for rash.   No chest pain no abdominal pain no change in bowel habits no blood in stool    Objective:   Physical Exam Alert no apparent distress. Moderate malaise. Lungs clear no wheezes heart rhythm irregular but good control ankles without edema patchy eczema and noted on the skin. Low back pain full to percussion. Hip painful with rotation.       essment & Plan:   impression chronic back and chronic hip pain severe see above #2 hypertension good control. #3 atrial fibrillation reasonable control. Patient could not tolerate anticoagulants as far as compliance. #4 eczema. Plan triamcinolone cream. Trial of hydrocodone. Diet exercise discussed in encourage WSL

## 2013-11-08 ENCOUNTER — Ambulatory Visit (INDEPENDENT_AMBULATORY_CARE_PROVIDER_SITE_OTHER): Payer: Medicare Other | Admitting: Urology

## 2013-11-08 DIAGNOSIS — Z87440 Personal history of urinary (tract) infections: Secondary | ICD-10-CM | POA: Diagnosis not present

## 2013-11-08 DIAGNOSIS — N3941 Urge incontinence: Secondary | ICD-10-CM

## 2013-11-08 DIAGNOSIS — N3020 Other chronic cystitis without hematuria: Secondary | ICD-10-CM

## 2013-11-08 DIAGNOSIS — D090 Carcinoma in situ of bladder: Secondary | ICD-10-CM | POA: Diagnosis not present

## 2013-11-08 DIAGNOSIS — Z8551 Personal history of malignant neoplasm of bladder: Secondary | ICD-10-CM

## 2013-11-27 ENCOUNTER — Telehealth: Payer: Self-pay | Admitting: Family Medicine

## 2013-11-27 DIAGNOSIS — M25559 Pain in unspecified hip: Secondary | ICD-10-CM

## 2013-11-27 NOTE — Telephone Encounter (Signed)
Let's do 

## 2013-11-27 NOTE — Telephone Encounter (Signed)
Patient said he had a hip replacement in 2012 and in the last couple of months, Dr. Romeo Apple released him and sent him to [facility] for pain management. He is not getting any results from them and he wants to know if we can get him set up in [place] with a pain clinic because it gets worse everyday.

## 2013-11-27 NOTE — Telephone Encounter (Signed)
Referral initiated in the system. Patient was notified.  

## 2013-12-12 ENCOUNTER — Telehealth: Payer: Self-pay | Admitting: Family Medicine

## 2013-12-12 NOTE — Telephone Encounter (Signed)
May add aleave two tabs bid to current hydrocod/ ov next wk if no better

## 2013-12-12 NOTE — Telephone Encounter (Signed)
Pt states he is taking hydrocodone but its not helping much. States aleve works better. appt with pain managemnet has not been scheduled yet.

## 2013-12-12 NOTE — Telephone Encounter (Signed)
Per [employer] info been sent to their office for review we are waiting to hear back for appt

## 2013-12-12 NOTE — Telephone Encounter (Signed)
Patient called today checking on referral to pain clinic [place]. He hAsnt heard anything yet and states still in pain and wants to how soon can this get done its been two weeks.

## 2013-12-13 NOTE — Telephone Encounter (Signed)
Discussed with patient

## 2013-12-23 ENCOUNTER — Telehealth: Payer: Self-pay | Admitting: Family Medicine

## 2013-12-23 MED ORDER — HYDROCODONE-ACETAMINOPHEN 5-325 MG PO TABS: ORAL_TABLET | ORAL | Status: DC

## 2013-12-23 NOTE — Telephone Encounter (Signed)
Yes can take ref hydrocod

## 2013-12-23 NOTE — Telephone Encounter (Signed)
Pt states his pain mgnt appt has not been scheduled  As of yet an he is out of his Hydrocodone or will be out  After tomorrow morning  An wants to know if it is still ok to take the aleve with it?   See mssg from 6/18

## 2013-12-24 NOTE — Telephone Encounter (Signed)
Notified patient yes can take. Script for hydrocodone is ready for pickup also.

## 2013-12-26 ENCOUNTER — Emergency Department (HOSPITAL_COMMUNITY)
Admission: EM | Admit: 2013-12-26 | Discharge: 2013-12-26 | Disposition: A | Discharge status: Home / Self Care | Payer: Medicare Other | Attending: Emergency Medicine | Admitting: Emergency Medicine

## 2013-12-26 ENCOUNTER — Encounter (HOSPITAL_COMMUNITY): Payer: Self-pay | Admitting: Emergency Medicine

## 2013-12-26 ENCOUNTER — Emergency Department (HOSPITAL_COMMUNITY): Payer: Medicare Other

## 2013-12-26 DIAGNOSIS — N400 Enlarged prostate without lower urinary tract symptoms: Secondary | ICD-10-CM | POA: Insufficient documentation

## 2013-12-26 DIAGNOSIS — I4891 Unspecified atrial fibrillation: Secondary | ICD-10-CM | POA: Insufficient documentation

## 2013-12-26 DIAGNOSIS — Z8719 Personal history of other diseases of the digestive system: Secondary | ICD-10-CM | POA: Insufficient documentation

## 2013-12-26 DIAGNOSIS — Z8781 Personal history of (healed) traumatic fracture: Secondary | ICD-10-CM | POA: Insufficient documentation

## 2013-12-26 DIAGNOSIS — Z791 Long term (current) use of non-steroidal anti-inflammatories (NSAID): Secondary | ICD-10-CM | POA: Insufficient documentation

## 2013-12-26 DIAGNOSIS — M129 Arthropathy, unspecified: Secondary | ICD-10-CM | POA: Diagnosis not present

## 2013-12-26 DIAGNOSIS — N189 Chronic kidney disease, unspecified: Secondary | ICD-10-CM | POA: Diagnosis not present

## 2013-12-26 DIAGNOSIS — J449 Chronic obstructive pulmonary disease, unspecified: Secondary | ICD-10-CM | POA: Diagnosis not present

## 2013-12-26 DIAGNOSIS — I872 Venous insufficiency (chronic) (peripheral): Secondary | ICD-10-CM | POA: Diagnosis not present

## 2013-12-26 DIAGNOSIS — Z79899 Other long term (current) drug therapy: Secondary | ICD-10-CM | POA: Diagnosis not present

## 2013-12-26 DIAGNOSIS — J42 Unspecified chronic bronchitis: Secondary | ICD-10-CM | POA: Diagnosis not present

## 2013-12-26 DIAGNOSIS — R609 Edema, unspecified: Secondary | ICD-10-CM

## 2013-12-26 DIAGNOSIS — Z8551 Personal history of malignant neoplasm of bladder: Secondary | ICD-10-CM | POA: Insufficient documentation

## 2013-12-26 DIAGNOSIS — I129 Hypertensive chronic kidney disease with stage 1 through stage 4 chronic kidney disease, or unspecified chronic kidney disease: Secondary | ICD-10-CM | POA: Insufficient documentation

## 2013-12-26 DIAGNOSIS — I482 Chronic atrial fibrillation, unspecified: Secondary | ICD-10-CM

## 2013-12-26 DIAGNOSIS — J4489 Other specified chronic obstructive pulmonary disease: Secondary | ICD-10-CM | POA: Insufficient documentation

## 2013-12-26 DIAGNOSIS — I10 Essential (primary) hypertension: Secondary | ICD-10-CM | POA: Diagnosis not present

## 2013-12-26 DIAGNOSIS — R600 Localized edema: Secondary | ICD-10-CM

## 2013-12-26 LAB — CBC WITH DIFFERENTIAL/PLATELET
BASOS PCT: 1 % (ref 0–1)
Basophils Absolute: 0 10*3/uL (ref 0.0–0.1)
Eosinophils Absolute: 0.4 10*3/uL (ref 0.0–0.7)
Eosinophils Relative: 5 % (ref 0–5)
HEMATOCRIT: 37.3 % — AB (ref 39.0–52.0)
HEMOGLOBIN: 12.3 g/dL — AB (ref 13.0–17.0)
Lymphocytes Relative: 12 % (ref 12–46)
Lymphs Abs: 0.9 10*3/uL (ref 0.7–4.0)
MCH: 31.1 pg (ref 26.0–34.0)
MCHC: 33 g/dL (ref 30.0–36.0)
MCV: 94.2 fL (ref 78.0–100.0)
MONO ABS: 0.3 10*3/uL (ref 0.1–1.0)
MONOS PCT: 5 % (ref 3–12)
Neutro Abs: 5.9 10*3/uL (ref 1.7–7.7)
Neutrophils Relative %: 77 % (ref 43–77)
Platelets: 210 10*3/uL (ref 150–400)
RBC: 3.96 MIL/uL — AB (ref 4.22–5.81)
RDW: 13.7 % (ref 11.5–15.5)
WBC: 7.6 10*3/uL (ref 4.0–10.5)

## 2013-12-26 LAB — PRO B NATRIURETIC PEPTIDE: Pro B Natriuretic peptide (BNP): 2822 pg/mL — ABNORMAL HIGH (ref 0–450)

## 2013-12-26 LAB — BASIC METABOLIC PANEL
ANION GAP: 11 (ref 5–15)
BUN: 20 mg/dL (ref 6–23)
CHLORIDE: 102 meq/L (ref 96–112)
CO2: 26 meq/L (ref 19–32)
CREATININE: 0.76 mg/dL (ref 0.50–1.35)
Calcium: 9.1 mg/dL (ref 8.4–10.5)
GFR calc Af Amer: >90 mL/min (ref >90)
GFR calc non Af Amer: 85 mL/min — ABNORMAL LOW (ref >90)
Glucose, Bld: 103 mg/dL — ABNORMAL HIGH (ref 70–99)
POTASSIUM: 4.2 meq/L (ref 3.7–5.3)
Sodium: 139 mEq/L (ref 137–147)

## 2013-12-26 LAB — TROPONIN I

## 2013-12-26 MED ORDER — VERAPAMIL HCL ER 120 MG PO TBCR: 120.0000 mg | EXTENDED_RELEASE_TABLET | Freq: Every day | ORAL | Status: DC

## 2013-12-26 MED ORDER — FUROSEMIDE 40 MG PO TABS
40.0000 mg | ORAL_TABLET | Freq: Once | ORAL | Status: AC
  Administered 2013-12-26: 40 mg via ORAL
  Filled 2013-12-26: qty 1

## 2013-12-26 MED ORDER — FUROSEMIDE 40 MG PO TABS: 40.0000 mg | ORAL_TABLET | Freq: Every day | ORAL | Status: DC

## 2013-12-26 MED ORDER — FUROSEMIDE 10 MG/ML IJ SOLN: 40.0000 mg | Freq: Once | INTRAMUSCULAR | Status: DC

## 2013-12-26 NOTE — ED Provider Notes (Signed)
CSN: [id number]     Arrival date & time 12/26/13  1021 History  This chart was scribed for [name] [name] [name] Dewing, MD by Leone PayorSonum Patel, ED Scribe. This patient was seen in room APA10/APA10 and the patient's care was started 10:53 AM.    Chief Complaint  Patient presents with  . Leg Swelling    Patient is a 80 y.o. male presenting with leg pain. The history is provided by the patient.  Leg Pain Location:  Leg Time since incident:  2 months Injury: no   Leg location:  L lower leg and R lower leg Pain details:    Radiates to:  Does not radiate   Severity:  Mild   Timing:  Constant   Progression:  Worsening Chronicity:  New Associated symptoms: swelling   Associated symptoms: no fever     HPI Comments: Ellie LunchFred M [name] is a 80 y.o. male who presents to the Emergency Department complaining of several months of constant  bilateral lower extremity swelling that worsened over the last few days. He also reports associated BLE pain and SOB which is worse with exertion. He reports intermittent cough and rhinorrhea which he states is controlled with allergy medication. He denies recent hospitalizations. He denies fever, vomiting, chest pain, abdominal pain.   PCP Luking   Past Medical History  Diagnosis Date  . Hypertension   . Alcohol abuse     stop drinking since 01/2009  . B12 deficiency 9/10    started injection   . COPD (chronic obstructive pulmonary disease)   . Compression fracture   . Venous stasis     chronic   . BPH (benign prostatic hyperplasia)   . Nutcracker esophagus 2002    on EM  . Diverticula of colon     few small scattered in the sigmoid colon  . GERD (gastroesophageal reflux disease)   . H/O hiatal hernia   . Headache(784.0)   . [facility]    BLADDER CANCER CELLS TX 2 MO AGO  . Chronic kidney disease     RECENT UTI FEW MONTHS AGO TX WITH ANTIBIOTIC  . Atrial fibrillation     off coumadin secondary to fall and subdural  hematoma   . External hemorrhoids 09/2014  .  Arthritis   . Reflux   . Asthma   . Osteoporosis   . Enlarged prostate   . Peripheral edema   . Carcinoma in situ of bladder    Past Surgical History  Procedure Laterality Date  . Hernia repair  12 YRS AGO    RIGHT SIDE   . Rhinoplasty  1974  . Lung biopsy  10 YRS AGO    BENIGN  . Joint replacement      RT HIP X2  (1 REPLACEMENT)  . Bladder aspiration      PROC FOR CANCER CELLS   . Cystoscopy  10/18/2011    Procedure: CYSTOSCOPY;  Surgeon: Anner CreteJohn J Wrenn, MD;  Location: [facility];  Service: Urology;  Laterality: N/A;  . Cataract extraction [name]/phaco  07/30/2012    Procedure: CATARACT EXTRACTION PHACO AND INTRAOCULAR LENS PLACEMENT (IOC);  Surgeon: Susa Simmondsarroll F Haines, MD;  Location: [facility];  Service: Ophthalmology;  Laterality: Left;  CDE:31.66  . Cataract extraction [name]/phaco Right 11/12/2012    Procedure: CATARACT EXTRACTION PHACO AND INTRAOCULAR LENS PLACEMENT (IOC);  Surgeon: Susa Simmondsarroll F Haines, MD;  Location: [facility];  Service: Ophthalmology;  Laterality: Right;  CDE 18.25   Family History  Problem Relation Age of Onset  .  Cancer Brother   . Hypertension Mother   . Diabetes Mother   . Heart attack Mother    History  Substance Use Topics  . Smoking status: Never Smoker   . Smokeless tobacco: Former Neurosurgeon    Types: Chew  . Alcohol Use: No     Comment: history of alcohol abuse stop drinking 8/10 . Alcohol abuse for about 25 years     Review of Systems  Constitutional: Negative for fever.  HENT: Positive for rhinorrhea.   Respiratory: Positive for cough and shortness of breath.   Cardiovascular: Negative for chest pain. Leg swelling: BLE.  Gastrointestinal: Negative for vomiting and abdominal pain.  Musculoskeletal: Positive for myalgias (BLE).  All other systems reviewed and are negative.     Allergies  Oxycodone-acetaminophen; Tramadol; and Sulfonamide derivatives  Home Medications   Prior to Admission medications   Medication Sig Start Date End Date Taking? Authorizing  Provider  alendronate (FOSAMAX) 70 MG tablet Take 1 tablet (70 mg total) by mouth every 7 (seven) days. Take with a full glass of water on an empty stomach. 04/22/13   Merlyn Albert, MD  amoxicillin-clavulanate (AUGMENTIN) 875-125 MG per tablet Take 1 tablet by mouth 2 (two) times daily. 11/05/13   Merlyn Albert, MD  HYDROcodone-acetaminophen (NORCO/VICODIN) 5-325 MG per tablet Take one tablet BID as need for pain. Caution drowsiness. 12/23/13   Merlyn Albert, MD  mupirocin ointment Idelle Jo) 2 % Apply BID to leg ulcer 11/05/13   Merlyn Albert, MD  naproxen sodium (ANAPROX) 220 MG tablet Take 220 mg by mouth 2 (two) times daily with a meal.    Historical Provider, MD  OVER THE COUNTER MEDICATION Calcium    Historical Provider, MD  OVER THE COUNTER MEDICATION Vitamin D    Historical Provider, MD  triamcinolone cream (KENALOG) 0.1 % Apply BID to rash. 11/05/13   Merlyn Albert, MD  verapamil (CALAN-SR) 240 MG CR tablet TAKE ONE TABLET BY MOUTH ONCE DAILY.    Merlyn Albert, MD   BP 156/63  Pulse 54  Temp(Src) 97.5 F (36.4 C) (Oral)  Resp 18  Ht 5\' 11"  (1.803 m)  Wt 217 lb (98.431 kg)  BMI 30.28 kg/m2  SpO2 97% Physical Exam  Nursing note and vitals reviewed.  CONSTITUTIONAL: Well developed/well nourished HEAD: Normocephalic/atraumatic EYES: EOMI/PERRL ENMT: Mucous membranes moist NECK: supple no meningeal signs SPINE:entire spine nontender CV: no murmurs/rubs/gallops noted LUNGS: crackles bilaterally, R>L. Scattered wheeze noted.  ABDOMEN: soft, nontender, no rebound or guarding GU:no cva tenderness NEURO: Pt is awake/alert, moves all extremitiesx4 EXTREMITIES: pulses normal, full ROM, 2+ pitting edema bilaterally  SKIN: warm, color normal PSYCH: no abnormalities of mood noted  ED Course  Procedures   DIAGNOSTIC STUDIES: Oxygen Saturation is 97% on RA, adequate by my interpretation.    COORDINATION OF CARE: 10:58 AM Discussed treatment plan with pt at bedside  and pt agreed to plan.    Pt monitored for several hours He is in no distress He was ambulatory in the ED without dyspnea and no hypoxia He had intermittent bradycardic rhythm though he admits having this before and he does not appear symptomatic He has chronic afib but not coumadin candidate per chart He admits LE edema is chronic.   I doubt acute CHF (BNP elevated but CXR did not demonstrate acute edema) Suspect some component of right sided failure He would like to go home D/[name] dr Gerda Diss He recommends cutting calan back to 120mg  Start lasix 40mg   daily and will see in office next week for echo  Labs Review Labs Reviewed  BASIC METABOLIC PANEL - Abnormal; Notable for the following:    Glucose, Bld 103 (*)    GFR calc non Af Amer 85 (*)    All other components within normal limits  CBC WITH DIFFERENTIAL - Abnormal; Notable for the following:    RBC 3.96 (*)    Hemoglobin 12.3 (*)    HCT 37.3 (*)    All other components within normal limits  PRO B NATRIURETIC PEPTIDE - Abnormal; Notable for the following:    Pro B Natriuretic peptide (BNP) 2822.0 (*)    All other components within normal limits  TROPONIN I    Imaging Review Dg Chest 2 View  12/26/2013   CLINICAL DATA:  short of breath short of breath  EXAM: CHEST  2 VIEW  COMPARISON:  Two-view chest 03/15/2013.  FINDINGS: Stable cardiomegaly. Stable m is ild prominence of the interstitial markings. No focal regions of consolidation or focal infiltrates. Old bilateral rib fractures and mid and lower thoracic spine compression fractures.  IMPRESSION: No active cardiopulmonary disease. Chronic bronchitic changes. Stable cardiomegaly.   Electronically Signed   By: Salome HolmesHector  Cooper M.D.   On: 12/26/2013 12:01     EKG Interpretation   Date/Time:  Thursday December 26 2013 11:06:41 EDT Ventricular Rate:  43 PR Interval:    QRS Duration: 128 QT Interval:  490 QTC Calculation: 414 R Axis:   58 Text Interpretation:  Atrial fibrillation  Ventricular premature complex  IVCD, consider atypical RBBB rate is slower on today's ekg Confirmed by  Bebe ShaggyWICKLINE  MD, Dorinda HillNALD ([id number]) on 12/26/2013 11:29:12 AM      MDM   Final diagnoses:  Chronic atrial fibrillation  Peripheral edema    Nursing notes including past medical history and social history reviewed and considered in documentation xrays reviewed and considered Labs/vital reviewed and considered Previous records reviewed and considered   I personally performed the services described in this documentation, which was scribed in my presence. The recorded information has been reviewed and is accurate.      [name] [name] Wiley Magan, MD 12/26/13 [phone]

## 2013-12-26 NOTE — ED Notes (Signed)
Patient ambulated in hallway with steady gait using walker. Denies dizziness, shortness of breath.

## 2013-12-26 NOTE — ED Notes (Addendum)
Leg swelling for months.  Worse now.  Has been seeing dr. Gerda Dissluking for his legs.

## 2013-12-26 NOTE — ED Notes (Signed)
MD at bedside. 

## 2013-12-26 NOTE — Discharge Instructions (Signed)
Atrial Fibrillation  Atrial fibrillation is a condition that causes your heart to beat irregularly. It may also cause your heart to beat faster than normal. Atrial fibrillation can prevent your heart from pumping blood normally. It increases your risk of stroke and heart problems.  HOME CARE  · Take medications as told by your doctor.  · Only take medications that your doctor says are safe. Some medications can make the condition worse or happen again.  · If blood thinners were prescribed by your doctor, take them exactly as told. Too much can cause bleeding. Too little and you will not have the needed protection against stroke and other problems.  · Perform blood tests at home if told by your doctor.  · Perform blood tests exactly as told by your doctor.  · Do not drink alcohol.  · Do not drink beverages with caffeine such as coffee, soda, and some teas.  · Maintain a healthy weight.  · Do not use diet pills unless your doctor says they are safe. They may make heart problems worse.  · Follow diet instructions as told by your doctor.  · Exercise regularly as told by your doctor.  · Keep all follow-up appointments.  GET HELP RIGHT AWAY IF:   · You have chest or belly (abdominal) pain.  · You feel sick to your stomach (nauseous)  · You suddenly have swollen feet and ankles.  · You feel dizzy.  · You face, arms, or legs feel numb or weak.  · There is a change in your vision or speech.  · You notice a change in the speed, rhythm, or strength of your heartbeat.  · You suddenly begin peeing (urinating) more often.  · You get tired more easily when moving or exercising.  MAKE SURE YOU:   · Understand these instructions.  · Will watch your condition.  · Will get help right away if you are not doing well or get worse.  Document Released: 03/22/2008 Document Revised: 10/08/2012 Document Reviewed: 07/24/2012  ExitCare® Patient Information ©2015 [employer]. This information is not intended to replace advice given to you by  your health care provider. Make sure you discuss any questions you have with your health care provider.

## 2013-12-26 NOTE — ED Notes (Signed)
C/o swelling and fever in legs.

## 2013-12-31 ENCOUNTER — Telehealth: Payer: Self-pay | Admitting: Family Medicine

## 2013-12-31 NOTE — Telephone Encounter (Signed)
Discussed with patient. Patient scheduled follow up office visit this week.

## 2013-12-31 NOTE — Telephone Encounter (Signed)
Patient was seen in ED on 12/26/13 for chronic A-fib and peripheral edema. He was told that the ED doctor would get with Dr. Brett CanalesSteve and we would then contact him and figure out what he needs to do. Patient wants to know if he is supposed to follow up with Dr. Brett CanalesSteve or if Dr. Brett CanalesSteve has got a chance to speak with the ED doctor?

## 2013-12-31 NOTE — Telephone Encounter (Signed)
Yes, i spoke with doc, they made changes in his med and advised him to f u with me this wk

## 2014-01-01 ENCOUNTER — Ambulatory Visit (INDEPENDENT_AMBULATORY_CARE_PROVIDER_SITE_OTHER): Payer: Medicare Other | Admitting: Family Medicine

## 2014-01-01 ENCOUNTER — Encounter: Payer: Self-pay | Admitting: Family Medicine

## 2014-01-01 VITALS — BP 122/70 | Ht 71.0 in | Wt 205.0 lb

## 2014-01-01 DIAGNOSIS — Z79899 Other long term (current) drug therapy: Secondary | ICD-10-CM

## 2014-01-01 DIAGNOSIS — I509 Heart failure, unspecified: Secondary | ICD-10-CM | POA: Insufficient documentation

## 2014-01-01 DIAGNOSIS — I10 Essential (primary) hypertension: Secondary | ICD-10-CM | POA: Diagnosis not present

## 2014-01-01 DIAGNOSIS — I4891 Unspecified atrial fibrillation: Secondary | ICD-10-CM | POA: Diagnosis not present

## 2014-01-01 DIAGNOSIS — I482 Chronic atrial fibrillation, unspecified: Secondary | ICD-10-CM

## 2014-01-01 MED ORDER — VERAPAMIL HCL ER 120 MG PO TBCR: EXTENDED_RELEASE_TABLET | ORAL | Status: DC

## 2014-01-01 MED ORDER — FUROSEMIDE 20 MG PO TABS: 20.0000 mg | ORAL_TABLET | Freq: Every day | ORAL | Status: DC

## 2014-01-01 NOTE — Progress Notes (Signed)
Subjective:    Patient ID: [name] [name], male    DOB: 12/01/1932, 80 y.o.   MRN: [id number]  HPIFollow up from ED. Went to ED on July 2nd for bilateral leg swelling.  Hospital told pt to decrease to 1/2 tablet daily.   Patient presents to the hospital last week. Had bilateral leg swelling. BNP was significantly elevated. ER notes reviewed today. I also spoke with the ER physician last week. Patient notes his legs feel better today. Still swollen at times. Still it she an irritated at times also.  Was found to be in atrial fibrillation with excessive rate control. Rate was in the 40s.  No excessive dyspnea.  Reports a lot of stress with wife with progressive dementia.  Not a candidate for anticoagulation because of multiple episodes of noncompliance in the past.    Review of Systems No chest pain no back pain no abdominal pain no change in bowel habits no blood in stool ROS otherwise negative    Objective:   Physical Exam Alert no apparent distress. HEENT normal. Blood pressure good on repeat 110/70 lungs clear no crackles heart ir regular in rhythm pulse up to 65 legs and ankles one half to one plus edema pulses good sensation intact good range of motion.       Assessment & Plan:  Impression 1 congestive heart failure clinically improved. #2 atrial fibrillation rate controlled now more appropriate likely will help of #1. Plan decrease Lasix to 20 mg daily. Maintain verapamil at one 2:30 40 SR daily rationale discussed. Echocardiogram to be scheduled. Cutdown salt intake. Watch weight gain. White shortness of breath. Encouraged to get wife in for further dementia assessment. 35-40 minutes spent in discussion with the ER physician review of ER and hospital records. Discussion with patient and establishment of a game plan for intervention. Followup as scheduled. Met 7 in a couple weeks. WSL

## 2014-01-03 ENCOUNTER — Other Ambulatory Visit (HOSPITAL_COMMUNITY): Payer: Medicare Other

## 2014-01-07 ENCOUNTER — Ambulatory Visit (HOSPITAL_COMMUNITY)
Admission: RE | Admit: 2014-01-07 | Discharge: 2014-01-07 | Disposition: A | Discharge status: Home / Self Care | Payer: Medicare Other | Source: Ambulatory Visit | Attending: Family Medicine | Admitting: Family Medicine

## 2014-01-07 DIAGNOSIS — I482 Chronic atrial fibrillation, unspecified: Secondary | ICD-10-CM

## 2014-01-07 DIAGNOSIS — I059 Rheumatic mitral valve disease, unspecified: Secondary | ICD-10-CM | POA: Diagnosis not present

## 2014-01-07 DIAGNOSIS — I4891 Unspecified atrial fibrillation: Secondary | ICD-10-CM | POA: Diagnosis not present

## 2014-01-07 NOTE — Progress Notes (Signed)
  Echocardiogram 2D Echocardiogram has been performed.  Aide Wojnar 01/07/2014, 11:19 AM

## 2014-01-22 ENCOUNTER — Other Ambulatory Visit: Payer: Self-pay | Admitting: *Deleted

## 2014-01-22 ENCOUNTER — Telehealth: Payer: Self-pay | Admitting: Family Medicine

## 2014-01-22 MED ORDER — HYDROCODONE-ACETAMINOPHEN 5-325 MG PO TABS: ORAL_TABLET | ORAL | Status: DC

## 2014-01-22 NOTE — Telephone Encounter (Signed)
Last seen 7/8. Last filled: 6/29  I see pt was supposed to be referred to a pain specialist back in June. Referral was placed 11/27/13. Not sure if he sees one or not?

## 2014-01-22 NOTE — Telephone Encounter (Signed)
Patient still has not heard anything about referral to pain management. [name] [name] is aware and following up. The referral was put in on 6/3. Pt is still in a lot of pain, so I refilled the hydrocodone. Pt notified to pick up rx.

## 2014-01-22 NOTE — Telephone Encounter (Signed)
Patient would like Rx for hydrocodone.

## 2014-01-22 NOTE — Telephone Encounter (Signed)
kacey--as pt, i'm pretty sure he sees one who conveniently does not rx pain meds, if not, may ref

## 2014-02-03 ENCOUNTER — Ambulatory Visit (INDEPENDENT_AMBULATORY_CARE_PROVIDER_SITE_OTHER): Payer: Medicare Other | Admitting: Family Medicine

## 2014-02-03 ENCOUNTER — Encounter: Payer: Self-pay | Admitting: Family Medicine

## 2014-02-03 VITALS — BP 128/88 | Ht 71.0 in | Wt 215.0 lb

## 2014-02-03 DIAGNOSIS — I4891 Unspecified atrial fibrillation: Secondary | ICD-10-CM | POA: Diagnosis not present

## 2014-02-03 DIAGNOSIS — I482 Chronic atrial fibrillation, unspecified: Secondary | ICD-10-CM

## 2014-02-03 DIAGNOSIS — Z96649 Presence of unspecified artificial hip joint: Secondary | ICD-10-CM

## 2014-02-03 DIAGNOSIS — J449 Chronic obstructive pulmonary disease, unspecified: Secondary | ICD-10-CM

## 2014-02-03 DIAGNOSIS — Z8679 Personal history of other diseases of the circulatory system: Secondary | ICD-10-CM | POA: Diagnosis not present

## 2014-02-03 DIAGNOSIS — G894 Chronic pain syndrome: Secondary | ICD-10-CM

## 2014-02-03 DIAGNOSIS — K644 Residual hemorrhoidal skin tags: Secondary | ICD-10-CM

## 2014-02-03 DIAGNOSIS — J4489 Other specified chronic obstructive pulmonary disease: Secondary | ICD-10-CM

## 2014-02-03 MED ORDER — FUROSEMIDE 40 MG PO TABS: 40.0000 mg | ORAL_TABLET | Freq: Every day | ORAL | Status: DC

## 2014-02-03 MED ORDER — TRIAMCINOLONE ACETONIDE 0.1 % EX CREA: 1.0000 "application " | TOPICAL_CREAM | Freq: Two times a day (BID) | CUTANEOUS | Status: DC

## 2014-02-03 MED ORDER — OXYCODONE-ACETAMINOPHEN 5-325 MG PO TABS: 1.0000 | ORAL_TABLET | Freq: Two times a day (BID) | ORAL | Status: DC | PRN

## 2014-02-03 NOTE — Progress Notes (Signed)
   Subjective:    Patient ID: [name] [name], male    DOB: 01/25/1933, [age] y.o.   MRN: [id number]  HPI Patient is here today for a f/u appt.   He had an Echo done on 7/14.  Pt said he went to the pain specialist last Thursday.   Pt still c/o hip and leg pain when walking. Now seen the specialist. Dr. Risa GrillBeth A. She is planning a steroid injection as long as we authorize.  Reports some shortness of breath with exertion. Also when sleeping.  Taking just 20 milligram Lasix daily.  Had an echocardiogram performed.  Was supposed to be taking one half of a 240 mg SR verapamil.   Ongoing substantial pain and hydrocodone not helping very much     Review of Systems No chest pain no loss of consciousness no abdominal pain no change in bowel habit blood in stool    Objective:   Physical Exam  Alert weight up 10 pounds vitals stable heart rate irregular decent control lungs basilar crackles ankles 1-2+ edema      Assessment & Plan:  Impression 1 CHF worsening Echo discussed. Overall good news with ejection fraction 45%. Not perfect but still room to maneuver. Compromise in rhythm also adding to pains. #2 atrial fibrillation stable #3 chronic pain discussed plan increase Lasix to 40 mg daily. Diet exercise discussed. Cut down Salt intake. Oxycodone prescribed. Symptomatic care discussed. WSL

## 2014-02-11 ENCOUNTER — Ambulatory Visit (INDEPENDENT_AMBULATORY_CARE_PROVIDER_SITE_OTHER): Payer: Medicare Other | Admitting: Family Medicine

## 2014-02-11 ENCOUNTER — Encounter: Payer: Self-pay | Admitting: Family Medicine

## 2014-02-11 VITALS — BP 128/68 | Ht 71.0 in | Wt 209.0 lb

## 2014-02-11 DIAGNOSIS — I509 Heart failure, unspecified: Secondary | ICD-10-CM

## 2014-02-11 DIAGNOSIS — Z79899 Other long term (current) drug therapy: Secondary | ICD-10-CM

## 2014-02-11 DIAGNOSIS — R21 Rash and other nonspecific skin eruption: Secondary | ICD-10-CM | POA: Diagnosis not present

## 2014-02-11 MED ORDER — CEPHALEXIN 500 MG PO CAPS
500.0000 mg | ORAL_CAPSULE | Freq: Four times a day (QID) | ORAL | Status: DC
Start: 2014-02-11 — End: 2014-02-28

## 2014-02-11 MED ORDER — POTASSIUM CHLORIDE CRYS ER 20 MEQ PO TBCR: 20.0000 meq | EXTENDED_RELEASE_TABLET | Freq: Every day | ORAL | Status: DC

## 2014-02-11 MED ORDER — FUROSEMIDE 40 MG PO TABS: ORAL_TABLET | ORAL | Status: DC

## 2014-02-11 NOTE — Progress Notes (Signed)
Subjective:    Patient ID: [name] [name], male    DOB: [birth year], 80 y.o.   MRN: [id number]  HPI Patient goes into great length today about his concern regarding his wife. She is having progressive dementia. His sons are trying to help with mixed results. This is wearing down patient and frustrating him.  Has had sores develop on his legs. No fever no chills. No obvious swelling.   claims compliance with fluid medicat does have a history of peripheral edema and presumed congestive heart failure. Complicated by his atrial fibrillation.ion.   Review of Systems  no chest pain occasional headaches no back pain no shortness of breath no abdominal pain no change in bowel habits    Objective:   Physical Exam Patient is here today due to a bilateral leg infection. Legs are also draining. Patient states that this is his concern today.  alert no acute distress. Talkative. HEENT normal. Lungs no crackles no tachypnea heart rare rhythm ankles 1+ edema bilateral leg ulcerations some serous discharge no obvious tenderness no erythema no purulence   impression peripheral edema complicated by CHF and venous stasis. (No frank infection but definitely possible discussed at length. Also wife since his situation discussed at length 25 minutes spent most in discussion. Initiate antibiotics. Increase Lasix. Patient has element of renal insufficiency will will need to reevaluate met 7. Cut down salt intake. WSL    Assessment & PlaImpression peripheral edema complicatn:

## 2014-02-11 NOTE — Patient Instructions (Signed)
Double up the furosemide to two tabs daily  Add one potassium tab daily  Check the renal function in a couple weeks  Also take the antibiotic as prescribed  These open sores may last for a long time (weeks and weeks)

## 2014-02-21 DIAGNOSIS — Z79899 Other long term (current) drug therapy: Secondary | ICD-10-CM | POA: Diagnosis not present

## 2014-02-22 LAB — BASIC METABOLIC PANEL
BUN: 31 mg/dL — ABNORMAL HIGH (ref 6–23)
CHLORIDE: 104 meq/L (ref 96–112)
CO2: 25 meq/L (ref 19–32)
CREATININE: 1.02 mg/dL (ref 0.50–1.35)
Calcium: 8.9 mg/dL (ref 8.4–10.5)
GLUCOSE: 91 mg/dL (ref 70–99)
Potassium: 5.2 mEq/L (ref 3.5–5.3)
Sodium: 138 mEq/L (ref 135–145)

## 2014-02-26 ENCOUNTER — Ambulatory Visit: Payer: Medicare Other | Admitting: Family Medicine

## 2014-02-28 ENCOUNTER — Ambulatory Visit (INDEPENDENT_AMBULATORY_CARE_PROVIDER_SITE_OTHER): Payer: Medicare Other | Admitting: Family Medicine

## 2014-02-28 ENCOUNTER — Encounter: Payer: Self-pay | Admitting: Family Medicine

## 2014-02-28 VITALS — BP 120/82 | Ht 71.0 in | Wt 205.0 lb

## 2014-02-28 DIAGNOSIS — R21 Rash and other nonspecific skin eruption: Secondary | ICD-10-CM | POA: Diagnosis not present

## 2014-02-28 MED ORDER — CEPHALEXIN 500 MG PO CAPS: 500.0000 mg | ORAL_CAPSULE | Freq: Three times a day (TID) | ORAL | Status: DC

## 2014-02-28 NOTE — Progress Notes (Signed)
   Subjective:    Patient ID: [name] [name], male    DOB: 07-Jun-1933, 80 y.o.   MRN: [id number]  HPI Patient is here today for a follow up visit for the sores on his legs. Patient is still currently taking Keflex for treatment. Patient states that the sores have improved. Draining has resolved. Patient is doing much better. Patient states that he is still waiting on Dr. Ozzie Hoyle office to call him about coming in for the injection in his back and hip.   Patient notes that his legs were some improved remarkably.  Handling a stronger dose of Lasix well.  Due to followup meds 7 which showed persistent good kidney function and good potassium. Review of Systems No chest pain no shortness of breath no rash elsewhere    Objective:   Physical Exam  Alert vital signs stable. Lungs clear. Heart regular in rhythm. H&T normal. Legs healing ulcerations      Assessment & Plan:  Impression leg ulcers secondary to venous stasis and peripheral edema now improved. Plan maintain new dose of Lasix long term. One further round of Keflex followup regular appointment. WSL

## 2014-03-17 ENCOUNTER — Other Ambulatory Visit: Payer: Self-pay | Admitting: *Deleted

## 2014-03-17 ENCOUNTER — Telehealth: Payer: Self-pay | Admitting: Family Medicine

## 2014-03-17 MED ORDER — OXYCODONE-ACETAMINOPHEN 5-325 MG PO TABS: 1.0000 | ORAL_TABLET | Freq: Two times a day (BID) | ORAL | Status: DC | PRN

## 2014-03-17 NOTE — Telephone Encounter (Signed)
Ok may ref times one 

## 2014-03-17 NOTE — Telephone Encounter (Signed)
Script ready for pickup. Pt notified.  

## 2014-03-17 NOTE — Telephone Encounter (Signed)
Last seen 02/28/14 lst script printed 02/03/14

## 2014-03-17 NOTE — Telephone Encounter (Signed)
Patient needs Rx for oxyCODONE-acetaminophen (ROXICET) 5-325 MG per tablet.

## 2014-04-02 DIAGNOSIS — M545 Low back pain: Secondary | ICD-10-CM | POA: Diagnosis not present

## 2014-04-02 DIAGNOSIS — M5417 Radiculopathy, lumbosacral region: Secondary | ICD-10-CM | POA: Diagnosis not present

## 2014-04-02 DIAGNOSIS — M4806 Spinal stenosis, lumbar region: Secondary | ICD-10-CM | POA: Diagnosis not present

## 2014-04-02 DIAGNOSIS — G894 Chronic pain syndrome: Secondary | ICD-10-CM | POA: Diagnosis not present

## 2014-04-02 DIAGNOSIS — M5137 Other intervertebral disc degeneration, lumbosacral region: Secondary | ICD-10-CM | POA: Diagnosis not present

## 2014-04-02 DIAGNOSIS — M4727 Other spondylosis with radiculopathy, lumbosacral region: Secondary | ICD-10-CM | POA: Diagnosis not present

## 2014-04-21 DIAGNOSIS — G894 Chronic pain syndrome: Secondary | ICD-10-CM | POA: Diagnosis not present

## 2014-04-21 DIAGNOSIS — M4727 Other spondylosis with radiculopathy, lumbosacral region: Secondary | ICD-10-CM | POA: Diagnosis not present

## 2014-04-21 DIAGNOSIS — M4806 Spinal stenosis, lumbar region: Secondary | ICD-10-CM | POA: Diagnosis not present

## 2014-04-21 DIAGNOSIS — M5417 Radiculopathy, lumbosacral region: Secondary | ICD-10-CM | POA: Diagnosis not present

## 2014-04-21 DIAGNOSIS — M5137 Other intervertebral disc degeneration, lumbosacral region: Secondary | ICD-10-CM | POA: Diagnosis not present

## 2014-04-21 DIAGNOSIS — M545 Low back pain: Secondary | ICD-10-CM | POA: Diagnosis not present

## 2014-04-22 DIAGNOSIS — M4806 Spinal stenosis, lumbar region: Secondary | ICD-10-CM | POA: Diagnosis not present

## 2014-04-22 DIAGNOSIS — M5137 Other intervertebral disc degeneration, lumbosacral region: Secondary | ICD-10-CM | POA: Diagnosis not present

## 2014-04-22 DIAGNOSIS — G894 Chronic pain syndrome: Secondary | ICD-10-CM | POA: Diagnosis not present

## 2014-04-22 DIAGNOSIS — M5417 Radiculopathy, lumbosacral region: Secondary | ICD-10-CM | POA: Diagnosis not present

## 2014-04-22 DIAGNOSIS — M545 Low back pain: Secondary | ICD-10-CM | POA: Diagnosis not present

## 2014-04-22 DIAGNOSIS — M4727 Other spondylosis with radiculopathy, lumbosacral region: Secondary | ICD-10-CM | POA: Diagnosis not present

## 2014-05-02 ENCOUNTER — Ambulatory Visit (INDEPENDENT_AMBULATORY_CARE_PROVIDER_SITE_OTHER): Payer: Medicare Other | Admitting: Family Medicine

## 2014-05-02 ENCOUNTER — Encounter: Payer: Self-pay | Admitting: Family Medicine

## 2014-05-02 VITALS — BP 122/80 | Ht 71.0 in | Wt 206.0 lb

## 2014-05-02 DIAGNOSIS — Z23 Encounter for immunization: Secondary | ICD-10-CM

## 2014-05-02 DIAGNOSIS — G894 Chronic pain syndrome: Secondary | ICD-10-CM

## 2014-05-02 DIAGNOSIS — I10 Essential (primary) hypertension: Secondary | ICD-10-CM

## 2014-05-02 DIAGNOSIS — Z9660 Presence of unspecified orthopedic joint implant: Secondary | ICD-10-CM

## 2014-05-02 DIAGNOSIS — K219 Gastro-esophageal reflux disease without esophagitis: Secondary | ICD-10-CM | POA: Diagnosis not present

## 2014-05-02 DIAGNOSIS — Z96649 Presence of unspecified artificial hip joint: Secondary | ICD-10-CM

## 2014-05-02 MED ORDER — OXYCODONE-ACETAMINOPHEN 5-325 MG PO TABS: 1.0000 | ORAL_TABLET | Freq: Two times a day (BID) | ORAL | Status: DC | PRN

## 2014-05-02 NOTE — Patient Instructions (Signed)

## 2014-05-02 NOTE — Progress Notes (Signed)
   Subjective:    Patient ID: [name] [name], male    DOB: 11/20/1932, 80 y.o.   MRN: [id number]  HPI This patient was seen today for chronic pain  The medication list was reviewed and updated.   -Compliance with pain medication: Yes, takes 1 tablet most days.  The patient was advised the importance of maintaining medication and not using illegal substances with these.  Refills needed: Yes, last refilled 03/27/14  The patient was educated that we can provide 3 monthly scripts for their medication, it is their responsibility to follow the instructions.  Side effects or complications from medications: No, he says sometimes it works, sometimes it does not.  Patient is aware that pain medications are meant to minimize the severity of the pain to allow their pain levels to improve to allow for better function. They are aware of that pain medications cannot totally remove their pain.  Due for UDT ( at least once per year) :     Patient continues to have persistent hip pain. Salt Dr. Romeo AppleHarrison who felt he did not have a difficulty with this prosthesis.  Continues to have low back pain. Saw Dr. But they and received multiple injections with minimal assistance.  Reports reflux overall is stable.  Ongoing stress with wife's progressive dementia  Review of Systems No headache no chest pain positive low back pain positive hip pain. No shortness breath some reflux but stable on meds    Objective:   Physical Exam  Alert no acute distress HEENT normal. Neck supple. Lungs clear. Heart rare irregularrhythm.but controlled Low back tender to per palpation. Negative true straight leg raise.      Assessment & Plan:  Impression 1 chronic back pain #2 chronic hip pain #3 chronic A. Fib chronically stable#4 reflux clinically stable plan maintain same medications. Recheck in several months. Diet exercise discussed.new pneumonia vaccine discussed and administered. WSL

## 2014-05-16 ENCOUNTER — Ambulatory Visit: Payer: Medicare Other | Admitting: Urology

## 2014-06-06 ENCOUNTER — Other Ambulatory Visit: Payer: Self-pay | Admitting: Family Medicine

## 2014-06-10 DIAGNOSIS — M542 Cervicalgia: Secondary | ICD-10-CM | POA: Diagnosis not present

## 2014-06-10 DIAGNOSIS — M25551 Pain in right hip: Secondary | ICD-10-CM | POA: Diagnosis not present

## 2014-06-25 ENCOUNTER — Ambulatory Visit (INDEPENDENT_AMBULATORY_CARE_PROVIDER_SITE_OTHER): Payer: Medicare Other | Admitting: Family Medicine

## 2014-06-25 ENCOUNTER — Encounter: Payer: Self-pay | Admitting: Family Medicine

## 2014-06-25 VITALS — BP 118/74 | Temp 97.6°F | Ht 71.0 in | Wt 211.0 lb

## 2014-06-25 DIAGNOSIS — L03115 Cellulitis of right lower limb: Secondary | ICD-10-CM

## 2014-06-25 DIAGNOSIS — L03116 Cellulitis of left lower limb: Secondary | ICD-10-CM | POA: Diagnosis not present

## 2014-06-25 DIAGNOSIS — R600 Localized edema: Secondary | ICD-10-CM | POA: Diagnosis not present

## 2014-06-25 MED ORDER — CEFPROZIL 500 MG PO TABS: 500.0000 mg | ORAL_TABLET | Freq: Two times a day (BID) | ORAL | Status: DC

## 2014-06-25 NOTE — Progress Notes (Signed)
   Subjective:    Patient ID: [name] [name], male    DOB: 04/20/1933, 80 y.o.   MRN: [id number]  HPIBlisters on both legs. Came up a couple of months ago. Clear drainage. Pain in legs sometimes.  He states he does not use a diuretic on a regular basis he states he only uses it when he thinks he needs it. He relates compliance with his other medicines. Denies fever chills or sweats.   Review of Systems He denies fever he does relate some soreness where the drainage as he states he has a history of bad circulation    Objective:   Physical Exam Lungs are clear hearts regular no crackles he has edema from the knee down on both sides with pitting edema he also has some drainage of tissue fluid along with what appears to be possible early cellulitis on the right side       Assessment & Plan:  Patient with chronic pedal edema not using diuretics I recommend taking at least 1 Lasix each morning keep in legs propped up when not active follow-up for recheck next week  Blistering on the legs is related to excessive edema. There is some element of cellulitis antibiotics prescribed nonstick dressings applied  Hard to know if some of the pedal edema could be related to verapamil as a secondary edema will leave that to his regular doctor

## 2014-06-25 NOTE — Patient Instructions (Signed)
Use the diuretic- Lasix - one each am  Take the antibiotic one 2 times a day for 10 days  Recheck in 1 week  Keep feet propped up

## 2014-07-03 ENCOUNTER — Ambulatory Visit (INDEPENDENT_AMBULATORY_CARE_PROVIDER_SITE_OTHER): Payer: Medicare Other | Admitting: Family Medicine

## 2014-07-03 ENCOUNTER — Encounter: Payer: Self-pay | Admitting: Family Medicine

## 2014-07-03 VITALS — BP 122/82 | Ht 71.0 in | Wt 211.0 lb

## 2014-07-03 DIAGNOSIS — R21 Rash and other nonspecific skin eruption: Secondary | ICD-10-CM | POA: Diagnosis not present

## 2014-07-03 MED ORDER — TRIAMCINOLONE ACETONIDE 0.1 % EX CREA: 1.0000 "application " | TOPICAL_CREAM | Freq: Two times a day (BID) | CUTANEOUS | Status: DC

## 2014-07-03 NOTE — Progress Notes (Signed)
   Subjective:    Patient ID: [name] [name], male    DOB: 03/26/1933, 81 y.o.   MRN: [id number]  HPI Patient arrives for a follow up on leg swelling. Patient states it is doing better but still weeping if bumps or scratches it.   Patient has long-standing history of venous stasis and venous stasis dermatitis. Develops intermittent rash with this. Flared up with rash itching and blistering. Near the finish of his antibiotics.  Review of Systems No chest pain no back pain no abdominal pain ROS otherwise negative    Objective:   Physical Exam  Alert vitals stable. Talkative lungs clear heart regular in rhythm bilateral rash is legs slight weeping slight vesicles. No major acute erythema or inflammation      Assessment & Plan:  Impression venous stasis dermatitis plan finish antibiotics. Triamcinolone cream twice a day to affected area. Symptomatic care discussed.

## 2014-07-25 ENCOUNTER — Ambulatory Visit (INDEPENDENT_AMBULATORY_CARE_PROVIDER_SITE_OTHER): Payer: Medicare Other | Admitting: Urology

## 2014-07-25 DIAGNOSIS — D090 Carcinoma in situ of bladder: Secondary | ICD-10-CM | POA: Diagnosis not present

## 2014-07-25 DIAGNOSIS — N3941 Urge incontinence: Secondary | ICD-10-CM

## 2014-07-25 DIAGNOSIS — N3020 Other chronic cystitis without hematuria: Secondary | ICD-10-CM | POA: Diagnosis not present

## 2014-07-25 DIAGNOSIS — Z86008 Personal history of in-situ neoplasm of other site: Secondary | ICD-10-CM | POA: Diagnosis not present

## 2014-07-25 DIAGNOSIS — N401 Enlarged prostate with lower urinary tract symptoms: Secondary | ICD-10-CM

## 2014-07-25 DIAGNOSIS — R300 Dysuria: Secondary | ICD-10-CM | POA: Diagnosis not present

## 2014-09-02 ENCOUNTER — Encounter: Payer: Self-pay | Admitting: Family Medicine

## 2014-09-02 ENCOUNTER — Ambulatory Visit (INDEPENDENT_AMBULATORY_CARE_PROVIDER_SITE_OTHER): Payer: Medicare Other | Admitting: Family Medicine

## 2014-09-02 VITALS — BP 108/64 | Temp 97.7°F | Wt 210.0 lb

## 2014-09-02 DIAGNOSIS — L0390 Cellulitis, unspecified: Secondary | ICD-10-CM | POA: Diagnosis not present

## 2014-09-02 MED ORDER — CEPHALEXIN 500 MG PO CAPS: 500.0000 mg | ORAL_CAPSULE | Freq: Three times a day (TID) | ORAL | Status: DC

## 2014-09-02 MED ORDER — OXYCODONE-ACETAMINOPHEN 5-325 MG PO TABS: 1.0000 | ORAL_TABLET | Freq: Two times a day (BID) | ORAL | Status: DC | PRN

## 2014-09-02 NOTE — Progress Notes (Signed)
   Subjective:    Patient ID: [name] [name], male    DOB: 10/07/1932, 81 y.o.   MRN: [id number]  HPIBilateral leg pain and swelling. Warm to touch. Started 6 - 8 months ago. Getting worse.   Needs refill on oxycodoneand fosamax. Pt states he has about 20 oxycodone at home. He tries to take only one a day. Sometimes he has to take 2 a day.   Patient has chronic intermittent back pain and leg pain severe at times. Requires more than over-the-counter medicines achieve control.  History of substantial venous stasis. This has become infected before. In recent days has noted warmth redness and increased drainage from legs  Review of Systems No headache no chest pain no back pain no abdominal pain no change in bowel habits    Objective:   Physical Exam Alert no acute distress. Vitals stable. Lungs clear heart regular in rhythm. Both legs edematous mild erythema no obvious warmth some vesicles with drainage. Low back pain full to percussion       Assessment & Plan:  Impression 1 venous stasis discussed #2 early secondary infection #3 chronic pain plan oxycodone refilled. Antibiotics initiated. Warning signs discussed WSL

## 2014-09-26 DIAGNOSIS — K644 Residual hemorrhoidal skin tags: Secondary | ICD-10-CM

## 2014-09-26 HISTORY — DX: Residual hemorrhoidal skin tags: K64.4

## 2014-09-29 ENCOUNTER — Telehealth: Payer: Self-pay | Admitting: Family Medicine

## 2014-09-29 MED ORDER — ESCITALOPRAM OXALATE 10 MG PO TABS: 10.0000 mg | ORAL_TABLET | Freq: Every day | ORAL | Status: DC

## 2014-09-29 NOTE — Telephone Encounter (Signed)
Patient called today saying that he has been having " little bouts of depression" and wanted to know if there was anything that Dr. Brett CanalesSteve could prescribe.  I explained to him that he probably needed to come in to discuss this with the doctor, but he wanted me to send a message back to ask.

## 2014-09-29 NOTE — Telephone Encounter (Signed)
Ask if any thoughts of hurting himself, if not, lexapro 10 mg numb thirty one qd five ref/ if so, needs to go to ER

## 2014-09-29 NOTE — Telephone Encounter (Signed)
Patient states he does not want to harm himself or others- Patient states he likes life too much. Med sent electronically to pharmacy. Patient notified.

## 2014-10-28 ENCOUNTER — Telehealth: Payer: Self-pay | Admitting: Family Medicine

## 2014-10-28 MED ORDER — OXYCODONE-ACETAMINOPHEN 5-325 MG PO TABS: 1.0000 | ORAL_TABLET | Freq: Two times a day (BID) | ORAL | Status: DC | PRN

## 2014-10-28 NOTE — Telephone Encounter (Signed)
Last 09/02/14 (cellulitis)

## 2014-10-28 NOTE — Telephone Encounter (Signed)
Patient requesting Rx for oxyCODONE-acetaminophen (ROXICET) 5-325 MG per tablet. He says he has about 8 left.

## 2014-10-28 NOTE — Telephone Encounter (Signed)
May ref times one 

## 2014-10-28 NOTE — Telephone Encounter (Signed)
Notified patient script ready for pick up. 

## 2014-11-14 DIAGNOSIS — Z86008 Personal history of in-situ neoplasm of other site: Secondary | ICD-10-CM | POA: Diagnosis not present

## 2014-11-21 ENCOUNTER — Other Ambulatory Visit: Payer: Self-pay | Admitting: Urology

## 2014-11-21 ENCOUNTER — Ambulatory Visit (HOSPITAL_COMMUNITY)
Admission: RE | Admit: 2014-11-21 | Discharge: 2014-11-21 | Disposition: A | Discharge status: Home / Self Care | Payer: Medicare Other | Source: Ambulatory Visit | Attending: Urology | Admitting: Urology

## 2014-11-21 ENCOUNTER — Ambulatory Visit (INDEPENDENT_AMBULATORY_CARE_PROVIDER_SITE_OTHER): Payer: Medicare Other | Admitting: Urology

## 2014-11-21 DIAGNOSIS — R3129 Other microscopic hematuria: Secondary | ICD-10-CM

## 2014-11-21 DIAGNOSIS — R312 Other microscopic hematuria: Secondary | ICD-10-CM

## 2014-11-21 DIAGNOSIS — Z86008 Personal history of in-situ neoplasm of other site: Secondary | ICD-10-CM | POA: Diagnosis not present

## 2014-11-21 DIAGNOSIS — N3020 Other chronic cystitis without hematuria: Secondary | ICD-10-CM | POA: Diagnosis not present

## 2014-11-21 DIAGNOSIS — N3941 Urge incontinence: Secondary | ICD-10-CM

## 2014-11-24 ENCOUNTER — Ambulatory Visit (HOSPITAL_COMMUNITY)
Admission: RE | Admit: 2014-11-24 | Discharge: 2014-11-24 | Disposition: A | Discharge status: Home / Self Care | Payer: Medicare Other | Source: Ambulatory Visit | Attending: Urology | Admitting: Urology

## 2014-11-24 DIAGNOSIS — R312 Other microscopic hematuria: Secondary | ICD-10-CM | POA: Diagnosis not present

## 2014-11-24 DIAGNOSIS — K8020 Calculus of gallbladder without cholecystitis without obstruction: Secondary | ICD-10-CM | POA: Insufficient documentation

## 2014-11-24 DIAGNOSIS — S3993XA Unspecified injury of pelvis, initial encounter: Secondary | ICD-10-CM | POA: Diagnosis not present

## 2014-11-24 DIAGNOSIS — S3991XA Unspecified injury of abdomen, initial encounter: Secondary | ICD-10-CM | POA: Diagnosis not present

## 2014-11-24 MED ORDER — SODIUM CHLORIDE 0.9 % IV SOLN
INTRAVENOUS | Status: AC
  Filled 2014-11-24: qty 250

## 2014-11-24 MED ORDER — IOHEXOL 300 MG/ML  SOLN
150.0000 mL | Freq: Once | INTRAMUSCULAR | Status: AC | PRN
  Administered 2014-11-24: 125 mL via INTRAVENOUS

## 2014-11-27 ENCOUNTER — Ambulatory Visit (INDEPENDENT_AMBULATORY_CARE_PROVIDER_SITE_OTHER): Payer: Medicare Other | Admitting: Family Medicine

## 2014-11-27 ENCOUNTER — Encounter: Payer: Self-pay | Admitting: Family Medicine

## 2014-11-27 VITALS — BP 124/90 | Temp 97.6°F | Ht 71.0 in | Wt 214.2 lb

## 2014-11-27 DIAGNOSIS — R1314 Dysphagia, pharyngoesophageal phase: Secondary | ICD-10-CM | POA: Diagnosis not present

## 2014-11-27 MED ORDER — PANTOPRAZOLE SODIUM 40 MG PO TBEC: 40.0000 mg | DELAYED_RELEASE_TABLET | Freq: Every day | ORAL | Status: DC

## 2014-11-27 NOTE — Progress Notes (Signed)
   Subjective:    Patient ID: [name] [name], male    DOB: 09/08/1932, 81 y.o.   MRN: [id number]  Leg Pain  The incident occurred more than 1 week ago. The incident occurred at home. There was no injury mechanism. The pain is present in the left leg and right leg. The quality of the pain is described as aching. The pain is moderate. The pain has been intermittent since onset. He reports no foreign bodies present. Treatments tried: oxycodone, icy hot. The treatment provided no relief.   patient notes leg pain is progressive and worsening. Feels he needs more than 2 pain tablets per day Patient states that the choking sensation in his throat has come back. He would like to talk to the doctor about this also.   Patient notes challenges with food getting caught at times. In the past had swallowing difficulties. In fact was diagnosed with esophageal motility disorder. Patient has had an EGD in the past but none for 5 years the best I can tell.  Day number [phone] Review of Systems No headache no chest pain no abdominal pain no change in bowel habits no blood in stool    Objective:   Physical Exam  Alert no acute distress talkative vitals stable. Pharynx normal neck supple. Lungs clear. Heart rare rhythm abdomen benign low back tender to percussion legs extremities thin and pulses intact      Assessment & Plan:  Impression 1 chronic pain legs worsening also element of back pain #2 progressive difficulty swallowing with history of esophageal motility disorder diagnosis in the past. Now patient feels like food is getting more and more stuck plan increase oxycodone 3 times a day. Rationale discussed. GI referral to further explore progressive dysphagia WSL

## 2014-12-04 ENCOUNTER — Other Ambulatory Visit: Payer: Self-pay | Admitting: Family Medicine

## 2014-12-04 NOTE — Telephone Encounter (Signed)
Ok to ref if one mo since last

## 2014-12-05 ENCOUNTER — Other Ambulatory Visit: Payer: Self-pay | Admitting: *Deleted

## 2014-12-05 ENCOUNTER — Telehealth: Payer: Self-pay | Admitting: Family Medicine

## 2014-12-05 MED ORDER — VERAPAMIL HCL ER 240 MG PO TBCR: 240.0000 mg | EXTENDED_RELEASE_TABLET | Freq: Every day | ORAL | Status: DC

## 2014-12-05 MED ORDER — OXYCODONE-ACETAMINOPHEN 5-325 MG PO TABS: 1.0000 | ORAL_TABLET | Freq: Three times a day (TID) | ORAL | Status: DC | PRN

## 2014-12-05 NOTE — Telephone Encounter (Signed)
Pt notified script ready for pick up.

## 2014-12-05 NOTE — Telephone Encounter (Signed)
Patient requesting Rx for oxycodone and he is requesting a larger amount since he was changed from 1 a day to 3 a day.

## 2014-12-05 NOTE — Telephone Encounter (Signed)
Patient wanting to know if he can get this before lunch today.

## 2014-12-05 NOTE — Telephone Encounter (Signed)
Directions on med list are one bid. Pt states at last visit on 6/2 u told him to take one tid and he has 5 pills left.

## 2014-12-05 NOTE — Telephone Encounter (Signed)
Yes change to one tid write him for ninety instead well sdee how long this lasts

## 2014-12-10 ENCOUNTER — Emergency Department (HOSPITAL_COMMUNITY): Payer: Medicare Other

## 2014-12-10 ENCOUNTER — Emergency Department (HOSPITAL_COMMUNITY)
Admission: EM | Admit: 2014-12-10 | Discharge: 2014-12-10 | Disposition: A | Discharge status: Home / Self Care | Payer: Medicare Other | Attending: Emergency Medicine | Admitting: Emergency Medicine

## 2014-12-10 ENCOUNTER — Other Ambulatory Visit: Payer: Self-pay | Admitting: Orthopedic Surgery

## 2014-12-10 ENCOUNTER — Telehealth: Payer: Self-pay | Admitting: Orthopedic Surgery

## 2014-12-10 ENCOUNTER — Encounter (HOSPITAL_COMMUNITY): Payer: Self-pay | Admitting: Emergency Medicine

## 2014-12-10 DIAGNOSIS — K219 Gastro-esophageal reflux disease without esophagitis: Secondary | ICD-10-CM | POA: Insufficient documentation

## 2014-12-10 DIAGNOSIS — Y998 Other external cause status: Secondary | ICD-10-CM | POA: Diagnosis not present

## 2014-12-10 DIAGNOSIS — J449 Chronic obstructive pulmonary disease, unspecified: Secondary | ICD-10-CM | POA: Insufficient documentation

## 2014-12-10 DIAGNOSIS — M25552 Pain in left hip: Secondary | ICD-10-CM

## 2014-12-10 DIAGNOSIS — Y9289 Other specified places as the place of occurrence of the external cause: Secondary | ICD-10-CM | POA: Insufficient documentation

## 2014-12-10 DIAGNOSIS — Z8551 Personal history of malignant neoplasm of bladder: Secondary | ICD-10-CM | POA: Diagnosis not present

## 2014-12-10 DIAGNOSIS — Z79899 Other long term (current) drug therapy: Secondary | ICD-10-CM | POA: Diagnosis not present

## 2014-12-10 DIAGNOSIS — Y9389 Activity, other specified: Secondary | ICD-10-CM | POA: Diagnosis not present

## 2014-12-10 DIAGNOSIS — N400 Enlarged prostate without lower urinary tract symptoms: Secondary | ICD-10-CM | POA: Diagnosis not present

## 2014-12-10 DIAGNOSIS — W19XXXA Unspecified fall, initial encounter: Secondary | ICD-10-CM

## 2014-12-10 DIAGNOSIS — Z7982 Long term (current) use of aspirin: Secondary | ICD-10-CM | POA: Insufficient documentation

## 2014-12-10 DIAGNOSIS — I4891 Unspecified atrial fibrillation: Secondary | ICD-10-CM | POA: Insufficient documentation

## 2014-12-10 DIAGNOSIS — W010XXA Fall on same level from slipping, tripping and stumbling without subsequent striking against object, initial encounter: Secondary | ICD-10-CM | POA: Diagnosis not present

## 2014-12-10 DIAGNOSIS — S79912A Unspecified injury of left hip, initial encounter: Secondary | ICD-10-CM | POA: Insufficient documentation

## 2014-12-10 DIAGNOSIS — N189 Chronic kidney disease, unspecified: Secondary | ICD-10-CM | POA: Insufficient documentation

## 2014-12-10 DIAGNOSIS — Z8639 Personal history of other endocrine, nutritional and metabolic disease: Secondary | ICD-10-CM | POA: Diagnosis not present

## 2014-12-10 DIAGNOSIS — M1612 Unilateral primary osteoarthritis, left hip: Secondary | ICD-10-CM | POA: Diagnosis not present

## 2014-12-10 DIAGNOSIS — I129 Hypertensive chronic kidney disease with stage 1 through stage 4 chronic kidney disease, or unspecified chronic kidney disease: Secondary | ICD-10-CM | POA: Insufficient documentation

## 2014-12-10 NOTE — ED Notes (Signed)
PT c/o falling upon left hip x2 weeks ago and c/o continued pain to lip hip and difficulty with ambulation. Upon assessment  bilateral lower edema and weeping.

## 2014-12-10 NOTE — ED Provider Notes (Signed)
CSN: [id number]     Arrival date & time 12/10/14  1000 History   First MD Initiated Contact with Patient 12/10/14 1122     Chief Complaint  Patient presents with  . Hip Pain      HPI Pt was seen at 1135. Per pt and his family, c/o gradual onset and persistence of constant left hip "pain" for the past 2 weeks. Pt states the pain began after he slipped and fell onto it. Pt has been ambulatory with his walker (usually uses a cane) since the fall. Pt called his Ortho Dr. Romeo Apple and was told to come to the ED for further evaluation. Denies new fall/injury, no focal motor weakness, no tingling/numbness in extremities, no back pain, no abd pain, no testicular pain/swelling, no dysuria/hematuria, no rash, no fevers.    Past Medical History  Diagnosis Date  . Hypertension   . Alcohol abuse     stop drinking since 01/2009  . B12 deficiency 9/10    started injection   . COPD (chronic obstructive pulmonary disease)   . Compression fracture   . Venous stasis     chronic   . BPH (benign prostatic hyperplasia)   . Nutcracker esophagus 2002    on EM  . Diverticula of colon     few small scattered in the sigmoid colon  . GERD (gastroesophageal reflux disease)   . H/O hiatal hernia   . Headache(784.0)   . Cancer [facility]    BLADDER CANCER CELLS TX 2 MO AGO  . Chronic kidney disease     RECENT UTI FEW MONTHS AGO TX WITH ANTIBIOTIC  . Atrial fibrillation     off coumadin secondary to fall and subdural  hematoma   . External hemorrhoids 09/2014  . Arthritis   . Reflux   . Asthma   . Osteoporosis   . Enlarged prostate   . Peripheral edema   . Carcinoma in situ of bladder    Past Surgical History  Procedure Laterality Date  . Hernia repair  12 YRS AGO    RIGHT SIDE   . Rhinoplasty  1974  . Lung biopsy  10 YRS AGO    BENIGN  . Joint replacement      RT HIP X2  (1 REPLACEMENT)  . Bladder aspiration      PROC FOR CANCER CELLS   . Cystoscopy  10/18/2011    Procedure: CYSTOSCOPY;   Surgeon: Anner Crete, MD;  Location: [facility];  Service: Urology;  Laterality: N/A;  . Cataract extraction w/phaco  07/30/2012    Procedure: CATARACT EXTRACTION PHACO AND INTRAOCULAR LENS PLACEMENT (IOC);  Surgeon: Susa Simmonds, MD;  Location: [facility];  Service: Ophthalmology;  Laterality: Left;  CDE:31.66  . Cataract extraction w/phaco Right 11/12/2012    Procedure: CATARACT EXTRACTION PHACO AND INTRAOCULAR LENS PLACEMENT (IOC);  Surgeon: Susa Simmonds, MD;  Location: [facility];  Service: Ophthalmology;  Laterality: Right;  CDE 18.25   Family History  Problem Relation Age of Onset  . Cancer Brother   . Hypertension Mother   . Diabetes Mother   . Heart attack Mother    History  Substance Use Topics  . Smoking status: Never Smoker   . Smokeless tobacco: Former Neurosurgeon    Types: Chew  . Alcohol Use: No     Comment: history of alcohol abuse stop drinking 8/10 . Alcohol abuse for about 25 years     Review of Systems ROS: Statement: All systems negative except  as marked or noted in the HPI; Constitutional: Negative for fever and chills. ; ; Eyes: Negative for eye pain, redness and discharge. ; ; ENMT: Negative for ear pain, hoarseness, nasal congestion, sinus pressure and sore throat. ; ; Cardiovascular: Negative for chest pain, palpitations, diaphoresis, dyspnea and peripheral edema. ; ; Respiratory: Negative for cough, wheezing and stridor. ; ; Gastrointestinal: Negative for nausea, vomiting, diarrhea, abdominal pain, blood in stool, hematemesis, jaundice and rectal bleeding. . ; ; Genitourinary: Negative for dysuria, flank pain and hematuria. ; ; Musculoskeletal: +left hip pain. Negative for back pain and neck pain. Negative for swelling and deformity.; ; Skin: Negative for pruritus, rash, abrasions, blisters, bruising and skin lesion.; ; Neuro: Negative for headache, lightheadedness and neck stiffness. Negative for weakness, altered level of consciousness , altered mental status, extremity  weakness, paresthesias, involuntary movement, seizure and syncope.       Allergies  Oxycodone-acetaminophen; Tramadol; and Sulfonamide derivatives  Home Medications   Prior to Admission medications   Medication Sig Start Date End Date Taking? Authorizing Provider  aspirin 325 MG tablet Take 325 mg by mouth daily.   Yes Historical Provider, MD  Chlorpheniramine Maleate (ALLERGY PO) Take 2 tablets by mouth daily as needed (allergies).   Yes Historical Provider, MD  Doxylamine Succinate, Sleep, (SLEEP AID PO) Take 1-2 tablets by mouth at bedtime as needed (sleep).   Yes Historical Provider, MD  escitalopram (LEXAPRO) 10 MG tablet Take 1 tablet (10 mg total) by mouth daily. 09/29/14 09/29/15 Yes Merlyn Albert, MD  naproxen sodium (ANAPROX) 220 MG tablet Take 220 mg by mouth 2 (two) times daily with a meal.   Yes Historical Provider, MD  OVER THE COUNTER MEDICATION Calcium   Yes Historical Provider, MD  OVER THE COUNTER MEDICATION Take 1 tablet by mouth daily. Vitamin D   Yes Historical Provider, MD  oxyCODONE-acetaminophen (ROXICET) 5-325 MG per tablet Take 1 tablet by mouth every 8 (eight) hours as needed for severe pain. 12/05/14  Yes Merlyn Albert, MD  pantoprazole (PROTONIX) 40 MG tablet Take 1 tablet (40 mg total) by mouth daily. 11/27/14 11/27/15 Yes Merlyn Albert, MD  verapamil (CALAN-SR) 240 MG CR tablet Take 1 tablet (240 mg total) by mouth daily. 12/05/14  Yes Merlyn Albert, MD  alendronate (FOSAMAX) 70 MG tablet Take 1 tablet (70 mg total) by mouth every 7 (seven) days. Take with a full glass of water on an empty stomach. Patient not taking: Reported on 12/10/2014 04/22/13   Merlyn Albert, MD  furosemide (LASIX) 40 MG tablet Take 2 tablets every morning Patient not taking: Reported on 12/10/2014 02/11/14   Merlyn Albert, MD  potassium chloride SA (KLOR-CON M20) 20 MEQ tablet Take 1 tablet (20 mEq total) by mouth daily. Patient not taking: Reported on 12/10/2014 02/11/14   Merlyn Albert, MD  triamcinolone cream (KENALOG) 0.1 % Apply 1 application topically 2 (two) times daily. Patient not taking: Reported on 12/10/2014 07/03/14   Merlyn Albert, MD   BP 161/100 mmHg  Pulse 45  Temp(Src) 97.7 F (36.5 C) (Oral)  Resp 18  Ht  (1.803 m)  Wt 214 lb (97.07 kg)  BMI 29.86 kg/m2  SpO2 95% Physical Exam  1140: Physical examination:  Nursing notes reviewed; Vital signs and O2 SAT reviewed;  Constitutional: Well developed, Well nourished, Well hydrated, In no acute distress; Head:  Normocephalic, atraumatic; Eyes: EOMI, PERRL, No scleral icterus; ENMT: Mouth and pharynx normal, Mucous membranes moist;  Neck: Supple, Full range of motion, No lymphadenopathy; Cardiovascular: Regular rate and rhythm, No gallop; Respiratory: Breath sounds clear & equal bilaterally, No rales, rhonchi, wheezes.  Speaking full sentences with ease, Normal respiratory effort/excursion; Chest: Nontender, Movement normal; Abdomen: Soft, Nontender, Nondistended, Normal bowel sounds; Genitourinary: No CVA tenderness; Spine:  No midline CS, TS, LS tenderness.;; Extremities: Pulses normal, Pelvis stable. +left hip tenderness to palp. No deformity. No edema, No calf edema or asymmetry. +chronic skin changes bilat LE's.; Neuro: AA&Ox3, Major CN grossly intact.  Speech clear. No gross focal motor or sensory deficits in extremities.; Skin: Color normal, Warm, Dry.   ED Course  Procedures     EKG Interpretation None      MDM  MDM Reviewed: previous chart, nursing note and vitals Interpretation: x-ray     Dg Hip Unilat With Pelvis 2-3 Views Left 12/10/2014   CLINICAL DATA:  Larey Seat on left hip 2 weeks ago with continued pain and difficulty ambulating. Bilateral lower extremity edema. Initial encounter.  EXAM: LEFT HIP (WITH PELVIS) 2-3 VIEWS  COMPARISON:  Right hip radiographs 05/14/2013. Pelvic radiograph as part of a right hip series 16109.  FINDINGS: Right total hip arthroplasty is partially  visualized in appears grossly located on the AP projection of the pelvis. The left hip is located. No acute fracture is identified. Slight contour irregularity at the lateral aspect of the left femoral head- neck junction appears degenerative. There is mild to moderate left hip joint space narrowing with marginal osteophytosis. The bones are osteopenic. Vascular calcifications are noted. Sequelae of prior vertebral augmentation of lower lumbar spine compression fractures are partially visualized.  IMPRESSION: No acute osseous abnormality identified.  Left hip osteoarthrosis.   Electronically Signed   By: Sebastian Ache   On: 12/10/2014 11:00    1155:  XR reassuring. Recommended CT to further evaluate left hip. Pt refuses. Pt states he "just had a CT scan 2 weeks ago of my whole body." Pt states he is "too impatient to stay here for more testing" and wants to leave right now. Encouraged pt to stay and have testing; pt refuses. Pt makes his own medical decisions. Risks of not having further imaging study on his left hip explained to pt and family, including, but not limited to:  Fracture, falling, stroke, heart attack, cardiac arrythmia ("irregular heart rate/beat"), "passing out," temporary and/or permanent disability, death.  Pt and family verb understanding and continue to refuse to stay for further testing, understanding the consequences of their decision.  I encouraged pt to follow up with his PMD and Ortho MD today and return to the ED immediately if symptoms worsen, he changes his mind, or for any other concerns.  Pt and family verb understanding, agreeable.   Samuel Jester, DO 12/13/14 [phone]

## 2014-12-10 NOTE — Discharge Instructions (Signed)
°Emergency Department Resource Guide °1) Find a Doctor and Pay Out of Pocket °Although you won't have to find out who is covered by your insurance plan, it is a good idea to ask around and get recommendations. You will then need to call the office and see if the doctor you have chosen will accept you as a new patient and what types of options they offer for patients who are self-pay. Some doctors offer discounts or will set up payment plans for their patients who do not have insurance, but you will need to ask so you aren't surprised when you get to your appointment. ° °2) Contact Your Local Health Department °Not all health departments have doctors that can see patients for sick visits, but many do, so it is worth a call to see if yours does. If you don't know where your local health department is, you can check in your phone book. The CDC also has a tool to help you locate your state's health department, and many state websites also have listings of all of their local health departments. ° °3) Find a Walk-in Clinic °If your illness is not likely to be very severe or complicated, you may want to try a walk in clinic. These are popping up all over the country in pharmacies, drugstores, and shopping centers. They're usually staffed by nurse practitioners or physician assistants that have been trained to treat common illnesses and complaints. They're usually fairly quick and inexpensive. However, if you have serious medical issues or chronic medical problems, these are probably not your best option. ° °No Primary Care Doctor: °- Call [facility] at  832-8000 - they can help you locate a primary care doctor that  accepts your insurance, provides certain services, etc. °- Physician Referral Service- 1-800-533-3463 ° °Chronic Pain Problems: °Organization         Address  Phone   Notes  °[facility]  (336) 297-2271 Patients need to be referred by their primary care doctor.  ° °Medication  Assistance: °Organization         Address  Phone   Notes  °[place] [facility] [address] °[place], [place] [address] (336) 641-8030 --Must be a resident of [place] °-- Must have NO insurance coverage whatsoever (no Medicaid/ Medicare, etc.) °-- The pt. MUST have a primary care doctor that directs their care regularly and follows them in the community °  °[facility]  (866) 331-1348   °[employer]  (888) 892-1162   ° °Agencies that provide inexpensive medical care: °Organization         Address  Phone   Notes  °[facility]  (336) 832-8035   °[facility]    (336) 832-7272   °[facility] [address] °[place], [place] [address] (336) 832-4777   °[facility] [address], °[place] (336) 271-4999   °[facility]    (336) 373-0678   °[facility]    (336) 272-1050   °[facility] ° [address], [place] Phone:  (336) 832-4444, Fax:  (336) 832-4440 Hours of Operation:  9 am - 6 pm, M-F.  Also accepts Medicaid/Medicare and self-pay.  °[facility] ° [address], [place] Phone: (336) 832-3150, Fax: (336) 832-3151. Hours of Operation:  8:30 am - 5:30 pm, M-F.  Also accepts Medicaid and self-pay.  °[facility] [place] [address], [place] Phone: (336) 878-6027   °[facility] [address], [place], [place] (336)723-1848, Ext. 123 Mondays & Thursdays: 7-9 AM.  First 15 patients are seen on a first come, first serve basis. °  ° °Medicaid-accepting [place] Providers: ° °Organization         Address  Phone   Notes  °[facility] [address], [place] (336) 641-2100 Also accepts self-pay patients.  °[facility] [address], [place] ° (336) 856-9996   °[facility] [address], [place]  (336) 288-8857   °[facility] [address] [place] [address], [place] (336) 299-7000   °Veita Bland [address], [place]  ° (336) 373-1557 Only accepts [place] Access Medicaid patients after they have their name applied to their card.  ° °Self-Pay (no insurance) in [place]: ° °Organization         Address  Phone   Notes  °Sickle Cell Patients, [facility] [address], [place] (336) 832-1970   °[facility] [address], [place] (336) 832-4400   °[facility] ° [address], [place] (336) 992-4800   °[facility]/Dr. Osei-Bonsu ° [address] [place] [address], [place] or [address], [place] (336) 841-8500 Phone number for both [place] and [place] locations is the same.  °[facility] [address], [place] (336) 299-0000   °[facility] [address] [place] [address], [place] or [address] (336) 852-7530 °(336) 878-2260   °[facility] [address], [place] (336) 350-1642, phone; (336) 294-5005, fax Sees patients 1st and 3rd Saturday of every month.  Must not qualify for public or private insurance (i.e. Medicaid, Medicare, [place] Health Choice, Veterans' Benefits) • Household income should be no more than 200% of the poverty level •The clinic cannot treat you if you are pregnant or think you are pregnant • Sexually transmitted diseases are not treated at the clinic.  ° ° °Dental Care: °Organization         Address  Phone  Notes  °[place] [facility] [address], [place] (336) 641-6152 Accepts children up to age 21 who are enrolled in Medicaid or [place] Health Choice; pregnant women with a Medicaid card; and children who have applied for Medicaid or [place] Health Choice, but were declined, whose parents can pay a reduced fee at time of service.  °[place]  [facility] [place]  [address], [place] (336) 641-7733 Accepts children up to age 21 who are enrolled in Medicaid or [place] Health Choice; pregnant women with a Medicaid card; and children who have applied for Medicaid or [place] Health Choice, but were declined, whose parents can pay a reduced fee at time of service.  °[facility] ° [address], [place] (336) 641-4533 Patients are seen by appointment only. Walk-ins are not accepted. [facility] will see patients 18 years of age and older. °Monday - Tuesday (8am-5pm) °Most Wednesdays (8:30-5pm) °$30 per visit, cash only  °[facility] ° [address], [place] (336) 641-4533 Patients are seen by appointment only. Walk-ins are not accepted. [facility] will see patients 18 years of age and older. °One   Wednesday Evening (Monthly: Volunteer Based).  $30 per visit, cash only  °[facility]  (919) 537-3737 for adults; Children under age 4, call Graduate Pediatric Dentistry at (919) [phone]. Children aged 4-14, please call (919) [phone] to request a pediatric application. ° Dental services are provided in all areas of dental care including fillings, crowns and bridges, complete and partial dentures, implants, gum treatment, root canals, and extractions. Preventive care is also provided. Treatment is provided to both adults and children. °Patients are selected via a lottery and there is often a waiting list. °  °[facility] [address], °[place] ° (336) 763-8833 [email or web address] °  °[facility] [address], [place], [place] (336)723-1848, Ext. 123 Second and Fourth Thursday of each month, opens at 6:30 AM; Clinic ends at 9 AM.  Patients are seen on a first-come first-served basis, and a limited number are seen during each clinic.  ° °[facility] ° [address], [place], [place] (336) 723-7904    Eligibility Requirements °You must have lived in [place], or [place] counties for at least the last three months. °  You cannot be eligible for state or federal sponsored healthcare insurance, including Veterans Administration, Medicaid, or Medicare. °  You generally cannot be eligible for healthcare insurance through your employer.  °  How to apply: °Eligibility screenings are held every Tuesday and Wednesday afternoon from 1:00 pm until 4:00 pm. You do not need an appointment for the interview!  °[facility] [address], [place], [place] 336-631-2330   °[facility]  336-342-8273   °[facility]  336-703-3100   °[facility]  336-570-6415   ° °Behavioral Health Resources in the Community: °Intensive Outpatient Programs °Organization         Address  Phone  Notes  °[facility] [address], [place], [place] 336-878-6098   °[facility] [address], [place], [place] 336-832-9800   °[facility] [address], [place], [place] ° 336-882-2125   °[facility] [address],  °[place], [place] 1-800-853-5163 or 336-641-4981   °Substance Abuse Resources °Organization         Address  Phone  Notes  °[facility]  336-882-2125   °[facility]  336-784-9470   °[facility]  336-285-9073   °[facility]  336-845-3988   °Residential & Outpatient Substance Abuse Program  1-800-659-3381   °Psychological Services °Organization         Address  Phone  Notes  °[facility]  336- 832-9600   °[facility]  336- 378-7881   °[facility] [address], [place] 1-800-853-5163 or 336-641-4981   ° °Mobile Crisis Teams °Organization         Address  Phone  Notes  °[facility], Mobile Crisis Care Unit  1-877-626-1772   °Assertive °[facility] ° [address]  [place], [place] 336-834-9664   °Sharon DeEsch [address] °[place] 336-554-5454   ° °Self-Help/Support Groups °Organization         Address  Phone             Notes  °[facility]  - variety of support groups  336- 373-1402 Call for more information  °Narcotics Anonymous (NA), [facility] [address], °[place]  2 meetings at this location  ° °  Residential Treatment Programs Organization         Address  Phone  Notes  [facility] [address]    [place]  6-979-480-1655   [facility]  [address] [place] [address], [place], [place] 078-675-4492   [facility] [address] [place], [place] 010-071-2197 Admissions: 8am-3pm M-F  [facility] [address],    [place], [place] 588-325-4982   [facility] [address] [place], [place], [place] 641-583-0940   [facility] [address],  [place], [place] 768-088-1103   [facility] Intensive Outpatient [address], [place], [place] 159-458-5929   [facility] ([facility] [address] [place]  [place], [place] 2-446-286-3817 or 847-292-3854   [facility] ([facility] [address] [place], [place] 333-832-9191 Accepts Medicaid  [facility] [address],  [place] 6-606-004-5997 Substance Abuse/Addiction Treatment   [facility]         Address  Phone  Notes  [facility]  414 195 0434   Angie Fava, PhD [address] [place], [place]   (530)302-0532 or 6090063435   [facility]   [address] [place], [place] 202-515-9824   [facility] [address], [place], [place] 251-580-2545 Insurance/Medicaid/sponsorship through [facility] and Families [address]                                    [place], [place] [phone] Therapy/tele-psych/case    [facility] [address], [place] (514)279-2824    Dr. Lolly Mustache  [phone]   [facility]  [employer] [facility] Dept. 1) [address], [place] 2) [address] [place] 3)  [address], [place] [phone] (780) [phone]  (970) 012-2728   [facility] Child Abuse Hotline 404-381-9265 or 769 589 0735 (After Hours)      Take your usual prescriptions as previously directed.  Apply moist heat or ice to the area(s) of discomfort, for 15 minutes at a time, several times per day for the next few days.  Do not fall asleep on a heating or ice pack. Walk with your walker. Call your regular Orthopedic doctor today to schedule a follow up appointment this week.  Return to the Emergency Department immediately if worsening.

## 2014-12-10 NOTE — Telephone Encounter (Signed)
Call received from patient, states he had a hard fall about a week ago, and he thinks he may fractured his hip, and is inquiring about appointment today.  I relayed that Dr Romeo Apple is in surgery, however, relayed that this type of medical issue is best addressed through work-up at Emergency Room.  Patient states will have son come and take him to [facility].

## 2014-12-25 ENCOUNTER — Encounter: Payer: Self-pay | Admitting: Internal Medicine

## 2014-12-26 ENCOUNTER — Encounter: Payer: Self-pay | Admitting: Internal Medicine

## 2014-12-31 DIAGNOSIS — I739 Peripheral vascular disease, unspecified: Secondary | ICD-10-CM | POA: Diagnosis not present

## 2014-12-31 DIAGNOSIS — L600 Ingrowing nail: Secondary | ICD-10-CM | POA: Diagnosis not present

## 2014-12-31 DIAGNOSIS — L03031 Cellulitis of right toe: Secondary | ICD-10-CM | POA: Diagnosis not present

## 2014-12-31 DIAGNOSIS — L03032 Cellulitis of left toe: Secondary | ICD-10-CM | POA: Diagnosis not present

## 2015-01-16 ENCOUNTER — Ambulatory Visit: Payer: Medicare Other | Admitting: Gastroenterology

## 2015-01-16 ENCOUNTER — Encounter: Payer: Self-pay | Admitting: Gastroenterology

## 2015-01-16 ENCOUNTER — Other Ambulatory Visit: Payer: Self-pay | Admitting: *Deleted

## 2015-01-16 ENCOUNTER — Telehealth: Payer: Self-pay | Admitting: Gastroenterology

## 2015-01-16 ENCOUNTER — Telehealth: Payer: Self-pay | Admitting: Family Medicine

## 2015-01-16 MED ORDER — OXYCODONE-ACETAMINOPHEN 5-325 MG PO TABS: 1.0000 | ORAL_TABLET | Freq: Three times a day (TID) | ORAL | Status: DC | PRN

## 2015-01-16 NOTE — Telephone Encounter (Signed)
PATIENT WAS A NO SHOW AND LETTER SENT  °

## 2015-01-16 NOTE — Telephone Encounter (Signed)
Patient wants refill on oxycodone 5/325.He has not had a med check yet. He states has 8 pills left.

## 2015-01-16 NOTE — Telephone Encounter (Signed)
He may have a refill but I would recommend office visit with Dr. Brett Canales in 3-4 weeks

## 2015-01-16 NOTE — Telephone Encounter (Signed)
Pt notified ready for pickup.

## 2015-03-16 ENCOUNTER — Other Ambulatory Visit: Payer: Self-pay | Admitting: Family Medicine

## 2015-03-16 NOTE — Telephone Encounter (Signed)
Patient would like refill on oxycocdone 5 mg.

## 2015-03-17 MED ORDER — OXYCODONE-ACETAMINOPHEN 5-325 MG PO TABS: 1.0000 | ORAL_TABLET | Freq: Three times a day (TID) | ORAL | Status: DC | PRN

## 2015-03-17 NOTE — Telephone Encounter (Signed)
Script ready for pickup. Left message to notify pt.

## 2015-03-17 NOTE — Telephone Encounter (Signed)
Ok times one 

## 2015-03-17 NOTE — Telephone Encounter (Signed)
Pt coming into town in the next hour an was wanting to pick  This script up then. He came yesterday an it had not been  Answered yet at that point.

## 2015-03-25 DIAGNOSIS — B351 Tinea unguium: Secondary | ICD-10-CM | POA: Diagnosis not present

## 2015-03-25 DIAGNOSIS — I739 Peripheral vascular disease, unspecified: Secondary | ICD-10-CM | POA: Diagnosis not present

## 2015-04-07 ENCOUNTER — Ambulatory Visit (INDEPENDENT_AMBULATORY_CARE_PROVIDER_SITE_OTHER): Payer: Medicare Other | Admitting: Family Medicine

## 2015-04-07 ENCOUNTER — Encounter: Payer: Self-pay | Admitting: Family Medicine

## 2015-04-07 VITALS — BP 130/72 | Ht 71.0 in | Wt 207.0 lb

## 2015-04-07 DIAGNOSIS — Z79899 Other long term (current) drug therapy: Secondary | ICD-10-CM

## 2015-04-07 DIAGNOSIS — I10 Essential (primary) hypertension: Secondary | ICD-10-CM | POA: Diagnosis not present

## 2015-04-07 DIAGNOSIS — Z23 Encounter for immunization: Secondary | ICD-10-CM

## 2015-04-07 MED ORDER — OXYCODONE-ACETAMINOPHEN 5-325 MG PO TABS: 1.0000 | ORAL_TABLET | Freq: Three times a day (TID) | ORAL | Status: DC | PRN

## 2015-04-07 MED ORDER — VERAPAMIL HCL ER 240 MG PO TBCR: 240.0000 mg | EXTENDED_RELEASE_TABLET | Freq: Every day | ORAL | Status: DC

## 2015-04-07 NOTE — Progress Notes (Signed)
   Subjective:    Patient ID: [name] [name], male    DOB: [birth year], 81 y.o.   MRN: [id number]  Dizziness This is a new problem. The current episode started more than 1 year ago. The problem occurs constantly. Associated symptoms include neck pain. Nothing aggravates the symptoms. He has tried nothing for the symptoms.    Dizziness is vertigo-like in nature. Tranxene. Comes and goes. Somewhat diminished hearing over the years.  Progressive neck pain. Tries to take just 2 oxycodone a day. Feels like he needs more. MRI 2 years, reviewed diffuse degenerative disease. Nothing neurosurgical.  Reports depression overall stable. Compliant with medication.  Still having trouble with swallowing. We set up an appointment. Unfortunately patient did not keep appointment. He thinks his wife with dementia may have fielded a phone call   Patient states no other concerns this visit..   Review of Systems  Musculoskeletal: Positive for neck pain.  Neurological: Positive for dizziness.       Objective:   Physical Exam  Alert vitals stable. Blood pressure good on repeat neck supple some pain with rotation some pain with flexion lungs clear. Heart rare rhythm ankles without edema. External ear canals normal TMs normal      Assessment & Plan:  Impression 1 intermittent vertigo in her ear symptom care discussed #2 hypertension good control #3 chronic neck pain did pain discussed needs to go up on dose #4 hyperlipidemia status uncertain #5 dysphagia plan we'll get Dr. works numbers of patient can call hopefully get back in. Appropriate blood work. Increase pain medicine to 3 per day. Local measures discussed. Flu shot today. Recheck in several months. WSL

## 2015-04-08 LAB — LIPID PANEL
CHOL/HDL RATIO: 5 ratio (ref 0.0–5.0)
Cholesterol, Total: 198 mg/dL (ref 100–199)
HDL: 40 mg/dL (ref >39)
LDL CALC: 126 mg/dL — AB (ref 0–99)
Triglycerides: 161 mg/dL — ABNORMAL HIGH (ref 0–149)
VLDL Cholesterol Cal: 32 mg/dL (ref 5–40)

## 2015-04-08 LAB — BASIC METABOLIC PANEL
BUN/Creatinine Ratio: 25 — ABNORMAL HIGH (ref 10–22)
BUN: 19 mg/dL (ref 8–27)
CHLORIDE: 98 mmol/L (ref 97–108)
CO2: 20 mmol/L (ref 18–29)
Calcium: 9.5 mg/dL (ref 8.6–10.2)
Creatinine, Ser: 0.76 mg/dL (ref 0.76–1.27)
GFR, EST AFRICAN AMERICAN: 100 mL/min/{1.73_m2} (ref >59)
GFR, EST NON AFRICAN AMERICAN: 86 mL/min/{1.73_m2} (ref >59)
Glucose: 91 mg/dL (ref 65–99)
POTASSIUM: 4.5 mmol/L (ref 3.5–5.2)
SODIUM: 140 mmol/L (ref 134–144)

## 2015-04-08 LAB — HEPATIC FUNCTION PANEL
ALBUMIN: 4.4 g/dL (ref 3.5–4.7)
ALK PHOS: 101 IU/L (ref 39–117)
ALT: 6 IU/L (ref 0–44)
AST: 12 IU/L (ref 0–40)
Bilirubin Total: 0.4 mg/dL (ref 0.0–1.2)
Bilirubin, Direct: 0.09 mg/dL (ref 0.00–0.40)
Total Protein: 6.7 g/dL (ref 6.0–8.5)

## 2015-04-09 ENCOUNTER — Telehealth: Payer: Self-pay | Admitting: Family Medicine

## 2015-04-09 NOTE — Telephone Encounter (Signed)
Pt dropped off [facility] that the [facility] can continue to drive.

## 2015-04-13 ENCOUNTER — Telehealth: Payer: Self-pay | Admitting: Family Medicine

## 2015-04-13 NOTE — Telephone Encounter (Signed)
Patient called asking if form for Driver's license was ready for pick up. States he dropped the form off last week.

## 2015-04-27 DIAGNOSIS — Z961 Presence of intraocular lens: Secondary | ICD-10-CM | POA: Diagnosis not present

## 2015-06-19 ENCOUNTER — Telehealth: Payer: Self-pay | Admitting: Family Medicine

## 2015-06-19 MED ORDER — OXYCODONE-ACETAMINOPHEN 5-325 MG PO TABS: 1.0000 | ORAL_TABLET | Freq: Three times a day (TID) | ORAL | Status: DC | PRN

## 2015-06-19 NOTE — Telephone Encounter (Signed)
Last seen 10/16 - last filled 04/28/15 can not find the December rx he was given at office visit

## 2015-06-19 NOTE — Telephone Encounter (Signed)
Okay may refill 1+1 refill

## 2015-06-19 NOTE — Telephone Encounter (Signed)
Rx up front for patient pick up. Patient notified. 

## 2015-06-19 NOTE — Telephone Encounter (Signed)
Pt is needing a refill on his oxycodone. 

## 2015-07-01 DIAGNOSIS — I739 Peripheral vascular disease, unspecified: Secondary | ICD-10-CM | POA: Diagnosis not present

## 2015-07-01 DIAGNOSIS — B351 Tinea unguium: Secondary | ICD-10-CM | POA: Diagnosis not present

## 2015-07-08 ENCOUNTER — Ambulatory Visit: Payer: Medicare Other | Admitting: Family Medicine

## 2015-07-14 ENCOUNTER — Ambulatory Visit (INDEPENDENT_AMBULATORY_CARE_PROVIDER_SITE_OTHER): Payer: Medicare Other | Admitting: Family Medicine

## 2015-07-14 ENCOUNTER — Encounter: Payer: Self-pay | Admitting: Family Medicine

## 2015-07-14 VITALS — BP 132/70 | Ht 71.0 in | Wt 211.0 lb

## 2015-07-14 DIAGNOSIS — M1990 Unspecified osteoarthritis, unspecified site: Secondary | ICD-10-CM

## 2015-07-14 DIAGNOSIS — I4891 Unspecified atrial fibrillation: Secondary | ICD-10-CM | POA: Diagnosis not present

## 2015-07-14 DIAGNOSIS — M1611 Unilateral primary osteoarthritis, right hip: Secondary | ICD-10-CM

## 2015-07-14 DIAGNOSIS — I10 Essential (primary) hypertension: Secondary | ICD-10-CM

## 2015-07-14 MED ORDER — OXYCODONE-ACETAMINOPHEN 5-325 MG PO TABS: 1.0000 | ORAL_TABLET | Freq: Three times a day (TID) | ORAL | Status: DC | PRN

## 2015-07-14 NOTE — Progress Notes (Signed)
   Subjective:    Patient ID: [name] [name], male    DOB: 26-Dec-1932, 82 y.o.   MRN: [id number]  Hypertension This is a chronic problem.   Having right hip pain. Had right hip replacement.  Noting progressive pain and hip. More more difficulty walking. Feels he needs to get back to orthopedic surgeon.  Dry skin on legs.  History of venous stasis both legs  Having trouble remembering things.  On further history primarily short-term memory issues.   Claims compliance with blood pressure medication. Meds reviewed today. No obvious side effects. Has cut down salt in his diet.   reports ongoing need for pain medication. States it helps no obvious side effects with it.   Review of Systems  no headache no chest pain no abdominal pain some chronic back pain.    Objective:   Physical Exam  alerts vital stable. Blood pressure good on repeat lungs clear. Heart irregular rate and rhythm. Hip positive pain with internal and external rotation. Some limitation of motion also. Legs bilateral venous stasis changes with secondary dermatitis       Assessment & Plan:   impression 1 progressive hip pain despite pain medicine. History of hip replacement in need of orthopedic surgeon #2 hypertension good control. Meds reviewed. #3 venous stasis discussed ongoing challenges #4 short-term memory issues discussed #5 atrial fibrillation decent control plan pain meds written. Other meds refilled. Diet exercise discussed. Orthopedic referral per patient request WSL

## 2015-07-28 ENCOUNTER — Other Ambulatory Visit (HOSPITAL_COMMUNITY): Payer: Self-pay | Admitting: Orthopedic Surgery

## 2015-07-28 DIAGNOSIS — M545 Low back pain, unspecified: Secondary | ICD-10-CM

## 2015-07-28 DIAGNOSIS — M79604 Pain in right leg: Secondary | ICD-10-CM | POA: Diagnosis not present

## 2015-07-28 DIAGNOSIS — M4726 Other spondylosis with radiculopathy, lumbar region: Secondary | ICD-10-CM | POA: Diagnosis not present

## 2015-07-28 DIAGNOSIS — Z96641 Presence of right artificial hip joint: Secondary | ICD-10-CM | POA: Diagnosis not present

## 2015-07-28 DIAGNOSIS — G8929 Other chronic pain: Secondary | ICD-10-CM

## 2015-07-28 DIAGNOSIS — M1612 Unilateral primary osteoarthritis, left hip: Secondary | ICD-10-CM | POA: Diagnosis not present

## 2015-08-12 ENCOUNTER — Ambulatory Visit (HOSPITAL_COMMUNITY)
Admission: RE | Admit: 2015-08-12 | Discharge: 2015-08-12 | Disposition: A | Discharge status: Home / Self Care | Payer: Medicare Other | Source: Ambulatory Visit | Attending: Orthopedic Surgery | Admitting: Orthopedic Surgery

## 2015-08-12 DIAGNOSIS — M5386 Other specified dorsopathies, lumbar region: Secondary | ICD-10-CM | POA: Insufficient documentation

## 2015-08-12 DIAGNOSIS — M4856XA Collapsed vertebra, not elsewhere classified, lumbar region, initial encounter for fracture: Secondary | ICD-10-CM | POA: Diagnosis not present

## 2015-08-12 DIAGNOSIS — G8929 Other chronic pain: Secondary | ICD-10-CM | POA: Insufficient documentation

## 2015-08-12 DIAGNOSIS — M47816 Spondylosis without myelopathy or radiculopathy, lumbar region: Secondary | ICD-10-CM | POA: Diagnosis not present

## 2015-08-12 DIAGNOSIS — M545 Low back pain: Secondary | ICD-10-CM | POA: Diagnosis not present

## 2015-08-12 DIAGNOSIS — M4854XA Collapsed vertebra, not elsewhere classified, thoracic region, initial encounter for fracture: Secondary | ICD-10-CM | POA: Insufficient documentation

## 2015-08-21 ENCOUNTER — Telehealth: Payer: Self-pay | Admitting: Family Medicine

## 2015-08-21 ENCOUNTER — Other Ambulatory Visit: Payer: Self-pay | Admitting: *Deleted

## 2015-08-21 MED ORDER — ESCITALOPRAM OXALATE 10 MG PO TABS: 10.0000 mg | ORAL_TABLET | Freq: Every day | ORAL | Status: DC

## 2015-08-21 NOTE — Telephone Encounter (Signed)
Nurse to call pt, make sure not suicidal, get some symptoms, rsume with three mo worth written

## 2015-08-21 NOTE — Telephone Encounter (Signed)
Pt is not sucidial and no thoughts of hurting anyone. He states he loves living and he just gets a little depressed sometimes. Refill sent to pharm.

## 2015-08-21 NOTE — Telephone Encounter (Signed)
Patient requesting Rx for his anti-depressant.  He doesn't know the name, but I believe its escitalopram.   Robbie Lis

## 2015-08-21 NOTE — Telephone Encounter (Signed)
Last refilled 4 /2016.

## 2015-08-25 DIAGNOSIS — M4726 Other spondylosis with radiculopathy, lumbar region: Secondary | ICD-10-CM | POA: Diagnosis not present

## 2015-08-25 DIAGNOSIS — M1612 Unilateral primary osteoarthritis, left hip: Secondary | ICD-10-CM | POA: Diagnosis not present

## 2015-08-25 DIAGNOSIS — M79604 Pain in right leg: Secondary | ICD-10-CM | POA: Diagnosis not present

## 2015-08-25 DIAGNOSIS — M810 Age-related osteoporosis without current pathological fracture: Secondary | ICD-10-CM | POA: Diagnosis not present

## 2015-09-09 DIAGNOSIS — I739 Peripheral vascular disease, unspecified: Secondary | ICD-10-CM | POA: Diagnosis not present

## 2015-09-09 DIAGNOSIS — B351 Tinea unguium: Secondary | ICD-10-CM | POA: Diagnosis not present

## 2015-09-10 ENCOUNTER — Encounter: Payer: Self-pay | Admitting: Nurse Practitioner

## 2015-09-10 ENCOUNTER — Ambulatory Visit (INDEPENDENT_AMBULATORY_CARE_PROVIDER_SITE_OTHER): Payer: Medicare Other | Admitting: Nurse Practitioner

## 2015-09-10 VITALS — BP 118/68 | HR 46 | Temp 97.0°F | Ht 71.0 in | Wt 205.0 lb

## 2015-09-10 DIAGNOSIS — K219 Gastro-esophageal reflux disease without esophagitis: Secondary | ICD-10-CM

## 2015-09-10 DIAGNOSIS — K5909 Other constipation: Secondary | ICD-10-CM

## 2015-09-10 DIAGNOSIS — R1310 Dysphagia, unspecified: Secondary | ICD-10-CM | POA: Diagnosis not present

## 2015-09-10 MED ORDER — PANTOPRAZOLE SODIUM 40 MG PO TBEC: 40.0000 mg | DELAYED_RELEASE_TABLET | Freq: Every day | ORAL | Status: DC

## 2015-09-10 NOTE — Assessment & Plan Note (Signed)
Patient with significant symptomatic constipation with pain medication. He is currently tried over-the-counter medications and is requesting something to better control symptoms. We'll start him on Amitiza 24 g twice a day with samples for 2 weeks and request for progress report to be called to our office in 2 weeks. If the medications effective we can send in a prescription. This should help with his likely opioid-induced constipation. Return for follow-up in 6 weeks.

## 2015-09-10 NOTE — Progress Notes (Signed)
cc'ed to pcp °

## 2015-09-10 NOTE — Progress Notes (Signed)
Primary Care Physician:  Laural Golden Primary Gastroenterologist:  Dr. Jena Gauss  Chief Complaint  Patient presents with  . Dysphagia  . Choking    HPI:   [name] [name] is a 82 y.o. male who presents For evaluation of "difficulty swallowing, choking." Last visit with our service was on 02/11/2010 for upper endoscopy. At that time it was noted he has a history of recurrent esophageal dysphagia. His EGD found focal narrowing at the GE junction without obvious ring, stricture, web, or other abnormality, no Barrett's or esophagitis. Noted small hiatal hernia, Lorcet patent. The GE junction narrowing was dilated with a 56 French Maloney dilator and subsequent 58 French Maloney dilator with noted disruption and minimal heme. Recommended continue omeprazole and follow-up appointment in one year. Unfortunately, he was lost to follow up as he was a no-show to his 1 year follow-up appointment.  Today he states he has gradually started having recurrent dysphagia symptoms over the past 3-4 years, getting progressively worse. Also with some hoarseness and more difficulty singing like he used to. Dysphagia with solid foods about every other day, also with some mild pill dysphagia and some occasional issues with liquids as well. Minimal to no GERD symptoms, not on PPI which was stopped about 1-2 years ago. Denies abdominal pain, N/V, melena. Has irregular bowel movements with some constipation on pain medication, occasional scant toilet tissue hematochezia and hemorrhoid flare-ups associated with his constipation. Is on Aleve daily for hip pain in addition to 5 mg Oxycodone. Takes milk of magnesia for constipation in addition to stool softener. Is interested in better management of OIC. Occasional dyspnea, sees PCP who is not concerned. 2D echo 2015 normal LV hypertrophy with normal EF, mild valvular regurgitation. EKG with AFib, LBBB. Denies chest pain, dizziness, lightheadedness, syncope, near syncope.  Denies any other upper or lower GI symptoms.  Past Medical History  Diagnosis Date  . Hypertension   . Alcohol abuse     stop drinking since 01/2009  . B12 deficiency 9/10    started injection   . COPD (chronic obstructive pulmonary disease) (HCC)   . Compression fracture   . Venous stasis     chronic   . BPH (benign prostatic hyperplasia)   . Nutcracker esophagus 2002    on EM  . Diverticula of colon     few small scattered in the sigmoid colon  . GERD (gastroesophageal reflux disease)   . H/O hiatal hernia   . Headache(784.0)   . Cancer (HCC) DR [facility]    BLADDER CANCER CELLS TX 2 MO AGO  . Chronic kidney disease     RECENT UTI FEW MONTHS AGO TX WITH ANTIBIOTIC  . Atrial fibrillation (HCC)     off coumadin secondary to fall and subdural  hematoma   . External hemorrhoids 09/2014  . Arthritis   . Reflux   . Asthma   . Osteoporosis   . Enlarged prostate   . Peripheral edema   . Carcinoma in situ of bladder     Past Surgical History  Procedure Laterality Date  . Hernia repair  12 YRS AGO    RIGHT SIDE   . Rhinoplasty  1974  . Lung biopsy  10 YRS AGO    BENIGN  . Joint replacement      RT HIP X2  (1 REPLACEMENT)  . Bladder aspiration      PROC FOR CANCER CELLS   . Cystoscopy  10/18/2011    Procedure: CYSTOSCOPY;  Surgeon:  Anner CreteJohn J Wrenn, MD;  Location: [facility];  Service: Urology;  Laterality: N/A;  . Cataract extraction w/phaco  07/30/2012    Procedure: CATARACT EXTRACTION PHACO AND INTRAOCULAR LENS PLACEMENT (IOC);  Surgeon: Susa Simmondsarroll F Haines, MD;  Location: [facility];  Service: Ophthalmology;  Laterality: Left;  CDE:31.66  . Cataract extraction w/phaco Right 11/12/2012    Procedure: CATARACT EXTRACTION PHACO AND INTRAOCULAR LENS PLACEMENT (IOC);  Surgeon: Susa Simmondsarroll F Haines, MD;  Location: [facility];  Service: Ophthalmology;  Laterality: Right;  CDE 18.25  . Esophagogastroduodenoscopy  02/11/2010    RMR: GE narrowing s/p dilation 56 & 58 Fr maloney otherwise normal    Current  Outpatient Prescriptions  Medication Sig Dispense Refill  . alendronate (FOSAMAX) 70 MG tablet Take 1 tablet (70 mg total) by mouth every 7 (seven) days. Take with a full glass of water on an empty stomach. 4 tablet 11  . aspirin 325 MG tablet Take 325 mg by mouth daily.    . Doxylamine Succinate, Sleep, (SLEEP AID PO) Take 1-2 tablets by mouth at bedtime as needed (sleep).    Marland Kitchen. escitalopram (LEXAPRO) 10 MG tablet Take 1 tablet (10 mg total) by mouth daily. 30 tablet 2  . furosemide (LASIX) 40 MG tablet Take 2 tablets every morning 60 tablet 0  . naproxen sodium (ANAPROX) 220 MG tablet Take 220 mg by mouth 2 (two) times daily with a meal.    . Niacin (VITAMIN B-3 PO) Take by mouth.    Marland Kitchen. OVER THE COUNTER MEDICATION Calcium    . OVER THE COUNTER MEDICATION Take 1 tablet by mouth daily. Vitamin D    . oxyCODONE-acetaminophen (ROXICET) 5-325 MG tablet Take 1 tablet by mouth every 8 (eight) hours as needed for severe pain. 90 tablet 0  . potassium chloride SA (KLOR-CON M20) 20 MEQ tablet Take 1 tablet (20 mEq total) by mouth daily. 30 tablet 0  . triamcinolone cream (KENALOG) 0.1 % Apply 1 application topically 2 (two) times daily. 60 g 0  . verapamil (CALAN-SR) 240 MG CR tablet Take 1 tablet (240 mg total) by mouth daily. 60 tablet 5  . pantoprazole (PROTONIX) 40 MG tablet Take 1 tablet (40 mg total) by mouth daily. 90 tablet 3   No current facility-administered medications for this visit.    Allergies as of 09/10/2015 - Review Complete 09/10/2015  Allergen Reaction Noted  . Oxycodone-acetaminophen Other (See Comments)   . Tramadol Itching   . Sulfonamide derivatives Hives     Family History  Problem Relation Age of Onset  . Cancer Brother   . Hypertension Mother   . Diabetes Mother   . Heart attack Mother     Social History   Social History  . Marital Status: Married    Spouse Name: N/A  . Number of Children: 2  . Years of Education: N/A   Occupational History  . Not on file.    Social History Main Topics  . Smoking status: Never Smoker   . Smokeless tobacco: Former NeurosurgeonUser    Types: Chew  . Alcohol Use: No     Comment: history of alcohol abuse stop drinking 8/10 . Alcohol abuse for about 25 years   . Drug Use: No  . Sexual Activity: Not on file   Other Topics Concern  . Not on file   Social History Narrative    Review of Systems: General: Negative for anorexia, weight loss, fever, chills, fatigue, weakness. Eyes: Negative for vision changes.  ENT: Positive hoarseness  and dysphagia. CV: Negative for chest pain, angina, palpitations, peripheral edema.  Respiratory: Negative for cough, sputum, wheezing.  GI: See history of present illness. MS: Chronic joint pain.  Derm: Negative for rash or itching.  Endo: Negative for unusual weight change.  Heme: Negative for bruising or bleeding. Allergy: Negative for rash or hives.    Physical Exam: BP 118/68 mmHg  Pulse 46  Temp(Src) 97 F (36.1 C)  Ht 5\' 11"  (1.803 m)  Wt 205 lb (92.987 kg)  BMI 28.60 kg/m2 General:   Alert and oriented. Pleasant and cooperative. Well-nourished and well-developed.  Head:  Normocephalic and atraumatic. Eyes:  Without icterus, sclera clear and conjunctiva pink.  Ears:  Minimally hard of hearing. Cardiovascular:  S1, S2 present without murmurs appreciated. Extremities without clubbing or edema. Respiratory:  Clear to auscultation bilaterally. No wheezes, rales, or rhonchi. No distress.  Gastrointestinal:  +BS, rounded but soft, non-tender and non-distended. No HSM noted. No guarding or rebound.  Rectal:  Deferred  Musculoskalatal:  Hobbling gait, ambulates with cane. Skin:  Intact without significant lesions or rashes. Neurologic:  Alert and oriented x4;  grossly normal neurologically. Psych:  Alert and cooperative. Normal mood and affect. Heme/Lymph/Immune: No excessive bruising noted.    09/10/2015 12:08 PM   Disclaimer: This note was dictated with voice  recognition software. Similar sounding words can inadvertently be transcribed and may not be corrected upon review.

## 2015-09-10 NOTE — Patient Instructions (Addendum)
1. Resume taking Protonix 40 mg once a day, 30 minutes before your first meal of the day. 2. We will schedule your swallowing study for you. 3. Start taking Amitiza 24 g twice a day on a full stomach. We will give the samples for 2 weeks. 4. Call with a progress report in 2 weeks on how the Amitiza is helping your bowel movements. 5. While taking Amitiza, stop taking other constipation medicines for now, like milk of magnesia. 6. Dermatology and diet instructions to help with her swallowing of solid foods. 7. Return for follow-up in 6 weeks.   Dysphagia Diet Level 3, Mechanically Advanced The dysphagia level 3 diet includes foods that are soft, moist, and can be chopped into 1-inch chunks. This diet is helpful for people with mild swallowing difficulties. It reduces the risk of food getting caught in the windpipe, trachea, or lungs. WHAT DO I NEED TO KNOW ABOUT THIS DIET?  You may eat foods that are soft and moist.  If you were on the dysphagia level 1 or level 2 diets, you may eat any of the foods included on those lists.  Avoid foods that are dry, hard, sticky, chewy, coarse, and crunchy. Also avoid large cuts of food.  Take small bites. Each bite should contain 1 inch or less of food.  Thicken liquids if instructed by your health care provider. Follow your health care provider's instructions on how to do this and to what consistency.  See your dietitian or speech language pathologist regularly for help with your dietary changes. WHAT FOODS CAN I EAT? Grains Moist breads without nuts or seeds. Biscuits, muffins, pancakes, and waffles well-moistened with syrup, jelly, margarine, or butter. Smooth cereals with plenty of milk to moisten them. Moist bread stuffing. Moist rice. Vegetables All cooked, soft vegetables. Shredded lettuce. Tender fried potatoes. Fruits All canned and cooked fruits. Soft, peeled fresh fruits, such as peaches, nectarines, kiwis, cantaloupe, honeydew melon, and  watermelon without seeds. Soft berries, such as strawberries. Meat and Other Protein Sources Moist ground or finely diced or sliced meats. Solid, tender cuts of meat. Meatloaf. Hamburger with a bun. Sausage patty. Deli thin-sliced lunch meat. Chicken, egg, or tuna salad sandwich. Sloppy joe. Moist fish. Eggs prepared any way. Casseroles with small chunks of meats, ground meats, or tender meats. Dairy Cheese spreads without coarse large chunks. Shredded cheese. Cheese slices. Cottage cheese. Milk at the right texture. Smooth frappes. Yogurt without nuts or coconut. Ask your health care provider whether you can have frozen desserts (such as malts or milk shakes) and thin liquids. Sweets/Desserts Soft, smooth, moist desserts. Non-chewy, smooth candy. Jam. Jelly. Honey. Preserves. Ask your health care provider whether you can have frozen desserts. Fats and Oils Butter. Oils. Margarine. Mayonnaise. Gravy. Spreads. Other All seasonings and sweeteners. All sauces without large chunks. The items listed above may not be a complete list of recommended foods or beverages. Contact your dietitian for more options. WHAT FOODS ARE NOT RECOMMENDED? Grains Coarse or dry cereals. Dry breads. Toast. Crackers. Tough, crusty breads, such French bread and baguettes. Tough, crisp fried potatoes. Potato skins. Dry bread stuffing. Granola. Popcorn. Chips. Vegetables All raw vegetables except shredded lettuce. Cooked corn. Rubbery or stiff cooked vegetables. Stringy vegetables, such as celery. Fruits Hard fruits that are difficult to chew, such as apples or pears. Stringy, high-pulp fruits, such as pineapple, papaya, or mango. Fruits with tough skins, such as grapes. Coconut. All dried fruits. Fruit leather. Fruit roll-ups. Fruit snacks. Meat and Other Protein  Sources Dry or tough meats or poultry. Dry fish. Fish with bones. Peanut butter. All nuts and seeds. Dairy  Any with nuts, seeds, chocolate chips, dried fruit,  coconut, or pineapple. Sweets/Desserts Dry cakes. Chewy or dry cookies. Any with nuts, seeds, dry fruits, coconut, pineapple, or anything dry, sticky, or hard. Chewy caramel. Licorice. Taffy-type candies. Ask your health care provider whether you can have frozen desserts. Fats and Oils Any with chunks, nuts, seeds, or pineapple. Olives. Rosita FirePickles. Other Soups with tough or large chunks of meats, poultry, or vegetables. Corn or clam chowder. The items listed above may not be a complete list of foods and beverages to avoid. Contact your dietitian for more information.   This information is not intended to replace advice given to you by your health care provider. Make sure you discuss any questions you have with your health care provider.   Document Released: 06/13/2005 Document Revised: 07/04/2014 Document Reviewed: 05/27/2013 Elsevier Interactive Patient Education [employer]2016 Elsevier Inc.

## 2015-09-10 NOTE — Assessment & Plan Note (Signed)
82 year old male with a history of dysphagia and submucosal ring stricture status post dilation last in 2011 now with complaints of worsening dysphagia starting approximately 1-2 years after his last dilation. He is having difficulties with solid foods, occasionally pills, occasionally liquids. At this point we will arrange for barium pill esophagram to further assess for recurrent stricture versus dysmotility disorder. Recommend dysphagia diet in the interim as per AVS instructions. Return for follow-up in 6 weeks.

## 2015-09-10 NOTE — Assessment & Plan Note (Signed)
Minimal GERD symptoms, however he was recommended to be on a PPI after his last dilation due to possible influence of silent reflux on his dysphagia. He stopped taking Protonix sometime ago. Recommend he restart Protonix 40 mg once a day. Return for follow-up in 6 weeks.

## 2015-09-11 ENCOUNTER — Ambulatory Visit (HOSPITAL_COMMUNITY)
Admission: RE | Admit: 2015-09-11 | Discharge: 2015-09-11 | Disposition: A | Discharge status: Home / Self Care | Payer: Medicare Other | Source: Ambulatory Visit | Attending: Nurse Practitioner | Admitting: Nurse Practitioner

## 2015-09-11 DIAGNOSIS — K219 Gastro-esophageal reflux disease without esophagitis: Secondary | ICD-10-CM

## 2015-09-11 DIAGNOSIS — K224 Dyskinesia of esophagus: Secondary | ICD-10-CM | POA: Diagnosis not present

## 2015-09-11 DIAGNOSIS — R1310 Dysphagia, unspecified: Secondary | ICD-10-CM | POA: Diagnosis present

## 2015-09-11 DIAGNOSIS — K5909 Other constipation: Secondary | ICD-10-CM | POA: Diagnosis not present

## 2015-09-16 ENCOUNTER — Telehealth: Payer: Self-pay

## 2015-09-16 NOTE — Telephone Encounter (Signed)
I called the pt with his [name] [name] results. He said he is still having some problems swallowing, even with the diet. And the [name] is not working for him.   His appt is May 3rd. Do you need to see him sooner?

## 2015-09-17 NOTE — Telephone Encounter (Signed)
Yes, let's please get him in sooner. May benefit to some degree from EGD/dilation. Can also try another medication for OIC. Thanks

## 2015-09-17 NOTE — Telephone Encounter (Signed)
Please get him a sooner appt.

## 2015-09-17 NOTE — Telephone Encounter (Signed)
MOVED TO SOONER APPT AND PATIENT IS AWARE

## 2015-09-22 ENCOUNTER — Encounter: Payer: Self-pay | Admitting: Family Medicine

## 2015-09-22 ENCOUNTER — Ambulatory Visit (INDEPENDENT_AMBULATORY_CARE_PROVIDER_SITE_OTHER): Payer: Medicare Other | Admitting: Family Medicine

## 2015-09-22 VITALS — BP 134/60 | Ht 71.0 in | Wt 206.4 lb

## 2015-09-22 DIAGNOSIS — L03115 Cellulitis of right lower limb: Secondary | ICD-10-CM | POA: Diagnosis not present

## 2015-09-22 MED ORDER — CEPHALEXIN 500 MG PO CAPS: ORAL_CAPSULE | ORAL | Status: DC

## 2015-09-22 NOTE — Progress Notes (Signed)
   Subjective:    Patient ID: [name] [name], male    DOB: 03/16/1933, 82 y.o.   MRN: [id number]  HPI  Patient in today for edema and pain to right foot. Patient states onset of pain several weeks ago.   History of known venous stasis. Has developed progressive swelling right leg more so than left. Notes redness and tenderness. No fever or chills. Slight thin discharge from blistered regions. Has also had this in the past. Compliant with it to Lasix per day  Would like to discuss taking a medication for memory and a stool softener prescription.  Review of Systems No high fevers no headache no chest pain no back pain    Objective:   Physical Exam  Alert vital stable lungs clear heart rhythm H&T normal legs 1+ edema erythema some tenderness right leg some areas of thin blistering      Assessment & Plan:  Impression possible early cellulitis with chronic venous stasis plan antibiotics prescribed. Symptom care discussed follow-up for chronic concerns as scheduled W[facility]

## 2015-09-23 ENCOUNTER — Ambulatory Visit: Payer: Medicare Other | Admitting: Family Medicine

## 2015-09-24 ENCOUNTER — Encounter: Payer: Self-pay | Admitting: Internal Medicine

## 2015-09-24 ENCOUNTER — Ambulatory Visit: Payer: Medicare Other | Admitting: Family Medicine

## 2015-09-24 ENCOUNTER — Ambulatory Visit (INDEPENDENT_AMBULATORY_CARE_PROVIDER_SITE_OTHER): Payer: Medicare Other | Admitting: Nurse Practitioner

## 2015-09-24 ENCOUNTER — Encounter: Payer: Self-pay | Admitting: Nurse Practitioner

## 2015-09-24 VITALS — BP 124/64 | HR 40 | Temp 96.2°F | Ht 71.0 in | Wt 208.0 lb

## 2015-09-24 DIAGNOSIS — K5909 Other constipation: Secondary | ICD-10-CM

## 2015-09-24 DIAGNOSIS — R1310 Dysphagia, unspecified: Secondary | ICD-10-CM | POA: Diagnosis not present

## 2015-09-24 NOTE — Assessment & Plan Note (Signed)
Continued dysphagia symptoms although somewhat improved with a dysphagia diet. Recommended continue the dysphagia diet. We had a discussion of possible endoscopy as his barium pill esophagram showed esophageal dysmotility as well as relative narrowing at the end of the esophagus with transient sticking of barium pill. He states he is not sure if he wants to proceed with an endoscopy and dilation at this time but he will think about it for a couple days and call us back. Recommend she do so. Return for follow-up in 3 months.

## 2015-09-24 NOTE — Assessment & Plan Note (Signed)
Continued constipation likely due to pain medications. Has not had a bowel movement in 4 days. Amitiza ineffective. We'll have him stop taking Amitiza and started Movantik 25 mg once a day. We'll provide samples for 2 weeks have him call with a progress report. Return for follow-up in 3 months.

## 2015-09-24 NOTE — Patient Instructions (Signed)
1. Stop taking Amitiza. 2. Start taking Movantik, 25 mg. Take 1 pill first thing in the morning on an empty stomach before eating or drinking anything. 3. Call back in 1 to 2 weeks with the results on whether this medicine is helping your constipation. 4. Call us back with your decision on whether you would like to proceed with an endoscopy. 5. Return for follow-up in 3 months.

## 2015-09-24 NOTE — Progress Notes (Signed)
Referring Provider: No ref. provider found Primary Care Physician:  Laural Golden Primary GI:  Dr. Jena Gauss  Chief Complaint  Patient presents with  . Follow-up  . Dysphagia    HPI:   [name] [name] is a 82 y.o. male who presents for persistent constipation and dysphagia. Last seen in our office 09/10/2015 which point noted gradual recurrence of his dysphagia symptoms which are progressively worse along with hoarseness, difficulty swallowing. Minimal to no GERD symptoms, not on a PPI for the previous one to 2 years. Constipation with pain medication hemorrhoid flares. He was started on Amitiza for his constipation. History of submucosal ring structure and a barium pill esophagram was ordered to further evaluate. He called our office about a week later saying no improvement constipation, continuing with dysphagia despite dysphagia diet. Barium pill esophagram showed laryngeal penetration and silent aspiration, esophageal dysmotility with relative narrowing of the distal esophagus; barium pill transiently delayed but eventually passed without fixed obstruction.  Today he states he is still having constipation, last bowel movement was 4 days ago. Is on oxycodone. Denies current hematochezia or melena. Abdominal pain minimal, cramping has resolved with Amitiza. Dysphagia symptoms are minimally improved with dysphagia diet, but persistent, happens multiple times a week. Has had to regurgitate. Choking is scary for him. Denies hematemesis. Also with sinus drainage. Denies any other upper or lower GI symptoms.  Past Medical History  Diagnosis Date  . Hypertension   . Alcohol abuse     stop drinking since 01/2009  . B12 deficiency 9/10    started injection   . COPD (chronic obstructive pulmonary disease) (HCC)   . Compression fracture   . Venous stasis     chronic   . BPH (benign prostatic hyperplasia)   . Nutcracker esophagus 2002    on EM  . Diverticula of colon     few small scattered  in the sigmoid colon  . GERD (gastroesophageal reflux disease)   . H/O hiatal hernia   . Headache(784.0)   . Cancer (HCC) DR [facility]    BLADDER CANCER CELLS TX 2 MO AGO  . Chronic kidney disease     RECENT UTI FEW MONTHS AGO TX WITH ANTIBIOTIC  . Atrial fibrillation (HCC)     off coumadin secondary to fall and subdural  hematoma   . External hemorrhoids 09/2014  . Arthritis   . Reflux   . Asthma   . Osteoporosis   . Enlarged prostate   . Peripheral edema   . Carcinoma in situ of bladder     Past Surgical History  Procedure Laterality Date  . Hernia repair  12 YRS AGO    RIGHT SIDE   . Rhinoplasty  1974  . Lung biopsy  10 YRS AGO    BENIGN  . Joint replacement      RT HIP X2  (1 REPLACEMENT)  . Bladder aspiration      PROC FOR CANCER CELLS   . Cystoscopy  10/18/2011    Procedure: CYSTOSCOPY;  Surgeon: Anner Crete, MD;  Location: [facility];  Service: Urology;  Laterality: N/A;  . Cataract extraction w/phaco  07/30/2012    Procedure: CATARACT EXTRACTION PHACO AND INTRAOCULAR LENS PLACEMENT (IOC);  Surgeon: Susa Simmonds, MD;  Location: [facility];  Service: Ophthalmology;  Laterality: Left;  CDE:31.66  . Cataract extraction w/phaco Right 11/12/2012    Procedure: CATARACT EXTRACTION PHACO AND INTRAOCULAR LENS PLACEMENT (IOC);  Surgeon: Susa Simmonds, MD;  Location: [facility];  Service: Ophthalmology;  Laterality: Right;  CDE 18.25  . Esophagogastroduodenoscopy  02/11/2010    RMR: GE narrowing s/p dilation 56 & 58 Fr maloney otherwise normal    Current Outpatient Prescriptions  Medication Sig Dispense Refill  . alendronate (FOSAMAX) 70 MG tablet Take 1 tablet (70 mg total) by mouth every 7 (seven) days. Take with a full glass of water on an empty stomach. 4 tablet 11  . aspirin 325 MG tablet Take 325 mg by mouth daily.    . cephALEXin (KEFLEX) 500 MG capsule Take 1 tablet by mouth three times a day for 10 days. 30 capsule 0  . Doxylamine Succinate, Sleep, (SLEEP AID PO) Take 1-2 tablets  by mouth at bedtime as needed (sleep).    Marland Kitchen escitalopram (LEXAPRO) 10 MG tablet Take 1 tablet (10 mg total) by mouth daily. 30 tablet 2  . furosemide (LASIX) 40 MG tablet Take 2 tablets every morning 60 tablet 0  . naproxen sodium (ANAPROX) 220 MG tablet Take 220 mg by mouth 2 (two) times daily with a meal.    . Niacin (VITAMIN B-3 PO) Take by mouth.    . Omega-3 Fatty Acids (FISH OIL PO) Take by mouth.    Marland Kitchen OVER THE COUNTER MEDICATION Calcium    . OVER THE COUNTER MEDICATION Take 1 tablet by mouth daily. Vitamin D    . oxyCODONE-acetaminophen (ROXICET) 5-325 MG tablet Take 1 tablet by mouth every 8 (eight) hours as needed for severe pain. 90 tablet 0  . pantoprazole (PROTONIX) 40 MG tablet Take 1 tablet (40 mg total) by mouth daily. 90 tablet 3  . potassium chloride SA (KLOR-CON M20) 20 MEQ tablet Take 1 tablet (20 mEq total) by mouth daily. 30 tablet 0  . verapamil (CALAN-SR) 240 MG CR tablet Take 1 tablet (240 mg total) by mouth daily. 60 tablet 5   No current facility-administered medications for this visit.    Allergies as of 09/24/2015 - Review Complete 09/24/2015  Allergen Reaction Noted  . Oxycodone-acetaminophen Other (See Comments)   . Tramadol Itching   . Sulfonamide derivatives Hives     Family History  Problem Relation Age of Onset  . Cancer Brother   . Hypertension Mother   . Diabetes Mother   . Heart attack Mother     Social History   Social History  . Marital Status: Married    Spouse Name: N/A  . Number of Children: 2  . Years of Education: N/A   Social History Main Topics  . Smoking status: Never Smoker   . Smokeless tobacco: Former Neurosurgeon    Types: Chew  . Alcohol Use: No     Comment: history of alcohol abuse stop drinking 8/10 . Alcohol abuse for about 25 years   . Drug Use: No  . Sexual Activity: Not Asked   Other Topics Concern  . None   Social History Narrative    Review of Systems: General: Negative for anorexia, weight loss, fever, chills,  fatigue, weakness. ENT: Negative for hoarseness, difficulty swallowing. CV: Negative for chest pain, angina, palpitations, peripheral edema.  Respiratory: Negative for dyspnea at rest, cough, sputum, wheezing.  GI: See history of present illness. Endo: Negative for unusual weight change.  Heme: Negative for bruising or bleeding.   Physical Exam: BP 124/64 mmHg  Pulse 40  Temp(Src) 96.2 F (35.7 C)  Ht  (1.803 m)  Wt 208 lb (94.348 kg)  BMI 29.02 kg/m2 General:   Alert and oriented. Pleasant and  cooperative. Well-nourished and well-developed.  Head:  Normocephalic and atraumatic. Cardiovascular:  S1, S2 present without murmurs appreciated. Extremities without clubbing or edema. Respiratory:  Clear to auscultation bilaterally. No wheezes, rales, or rhonchi. No distress.  Gastrointestinal:  +BS, soft, non-tender and non-distended. No HSM noted. No guarding or rebound. No masses appreciated.  Rectal:  Deferred  Musculoskalatal:  Symmetrical without gross deformities. Skin:  Intact without significant lesions or rashes. Neurologic:  Alert and oriented x4;  grossly normal neurologically. Psych:  Alert and cooperative. Normal mood and affect. Heme/Lymph/Immune: No excessive bruising noted.    09/24/2015 12:24 PM   Disclaimer: This note was dictated with voice recognition software. Similar sounding words can inadvertently be transcribed and may not be corrected upon review.

## 2015-09-24 NOTE — Progress Notes (Signed)
cc'ed to pcp °

## 2015-09-29 ENCOUNTER — Telehealth: Payer: Self-pay

## 2015-09-29 ENCOUNTER — Other Ambulatory Visit: Payer: Self-pay

## 2015-09-29 DIAGNOSIS — R1310 Dysphagia, unspecified: Secondary | ICD-10-CM

## 2015-09-29 NOTE — Telephone Encounter (Signed)
Correct, EGD and dilation is for dysphagia. BPE completed with noted relative narrowing of distal esophagus and transient delay of barium tablet.

## 2015-09-29 NOTE — Progress Notes (Signed)
Called patient and discussed risks over the phone.  Proceed with EGD with Dr. Jena Gaussourk in near future: the risks, benefits, and alternatives have been discussed with the patient in detail. The patient states understanding and desires to proceed.  The patient is not on any anticoagulants, anxiolytics, antidepressants. He does take pain medication as needed. Conscious sedation should likely be adequate for his procedure.

## 2015-09-29 NOTE — Telephone Encounter (Signed)
Pt called and states he is ready to proceed with the EGD/dil. Pt is on schedule for 04/06 @ 0915  Please verify the diagnosis is for dypshagia

## 2015-09-29 NOTE — Telephone Encounter (Signed)
See OV note for notation on discussion of risks.

## 2015-10-01 ENCOUNTER — Telehealth: Payer: Self-pay

## 2015-10-01 ENCOUNTER — Encounter (HOSPITAL_COMMUNITY)
Admission: RE | Disposition: A | Discharge status: Home / Self Care | Payer: Self-pay | Source: Ambulatory Visit | Attending: Internal Medicine

## 2015-10-01 ENCOUNTER — Encounter (HOSPITAL_COMMUNITY): Payer: Self-pay

## 2015-10-01 ENCOUNTER — Ambulatory Visit (HOSPITAL_COMMUNITY)
Admission: RE | Admit: 2015-10-01 | Discharge: 2015-10-01 | Disposition: A | Discharge status: Home / Self Care | Payer: Medicare Other | Source: Ambulatory Visit | Attending: Internal Medicine | Admitting: Internal Medicine

## 2015-10-01 ENCOUNTER — Other Ambulatory Visit: Payer: Self-pay

## 2015-10-01 DIAGNOSIS — M1990 Unspecified osteoarthritis, unspecified site: Secondary | ICD-10-CM | POA: Diagnosis not present

## 2015-10-01 DIAGNOSIS — M25559 Pain in unspecified hip: Secondary | ICD-10-CM | POA: Insufficient documentation

## 2015-10-01 DIAGNOSIS — K209 Esophagitis, unspecified without bleeding: Secondary | ICD-10-CM | POA: Insufficient documentation

## 2015-10-01 DIAGNOSIS — J449 Chronic obstructive pulmonary disease, unspecified: Secondary | ICD-10-CM | POA: Diagnosis not present

## 2015-10-01 DIAGNOSIS — K210 Gastro-esophageal reflux disease with esophagitis: Secondary | ICD-10-CM | POA: Diagnosis not present

## 2015-10-01 DIAGNOSIS — K5900 Constipation, unspecified: Secondary | ICD-10-CM | POA: Diagnosis not present

## 2015-10-01 DIAGNOSIS — K921 Melena: Secondary | ICD-10-CM | POA: Insufficient documentation

## 2015-10-01 DIAGNOSIS — K644 Residual hemorrhoidal skin tags: Secondary | ICD-10-CM | POA: Insufficient documentation

## 2015-10-01 DIAGNOSIS — Z79891 Long term (current) use of opiate analgesic: Secondary | ICD-10-CM | POA: Insufficient documentation

## 2015-10-01 DIAGNOSIS — Z7982 Long term (current) use of aspirin: Secondary | ICD-10-CM | POA: Insufficient documentation

## 2015-10-01 DIAGNOSIS — I129 Hypertensive chronic kidney disease with stage 1 through stage 4 chronic kidney disease, or unspecified chronic kidney disease: Secondary | ICD-10-CM | POA: Insufficient documentation

## 2015-10-01 DIAGNOSIS — Z791 Long term (current) use of non-steroidal anti-inflammatories (NSAID): Secondary | ICD-10-CM | POA: Diagnosis not present

## 2015-10-01 DIAGNOSIS — Z79899 Other long term (current) drug therapy: Secondary | ICD-10-CM | POA: Insufficient documentation

## 2015-10-01 DIAGNOSIS — Z96641 Presence of right artificial hip joint: Secondary | ICD-10-CM | POA: Diagnosis not present

## 2015-10-01 DIAGNOSIS — R490 Dysphonia: Secondary | ICD-10-CM | POA: Insufficient documentation

## 2015-10-01 DIAGNOSIS — I4891 Unspecified atrial fibrillation: Secondary | ICD-10-CM | POA: Diagnosis not present

## 2015-10-01 DIAGNOSIS — K2210 Ulcer of esophagus without bleeding: Secondary | ICD-10-CM | POA: Insufficient documentation

## 2015-10-01 DIAGNOSIS — K208 Other esophagitis: Secondary | ICD-10-CM

## 2015-10-01 DIAGNOSIS — R1310 Dysphagia, unspecified: Secondary | ICD-10-CM | POA: Insufficient documentation

## 2015-10-01 DIAGNOSIS — Z7983 Long term (current) use of bisphosphonates: Secondary | ICD-10-CM | POA: Insufficient documentation

## 2015-10-01 DIAGNOSIS — K220 Achalasia of cardia: Secondary | ICD-10-CM

## 2015-10-01 DIAGNOSIS — N189 Chronic kidney disease, unspecified: Secondary | ICD-10-CM | POA: Diagnosis not present

## 2015-10-01 HISTORY — PX: ESOPHAGOGASTRODUODENOSCOPY: SHX5428

## 2015-10-01 HISTORY — PX: MALONEY DILATION: SHX5535

## 2015-10-01 SURGERY — EGD (ESOPHAGOGASTRODUODENOSCOPY)
Anesthesia: Moderate Sedation

## 2015-10-01 MED ORDER — STERILE WATER FOR IRRIGATION IR SOLN
Status: DC | PRN
  Administered 2015-10-01: 10:00:00

## 2015-10-01 MED ORDER — MIDAZOLAM HCL 5 MG/5ML IJ SOLN
INTRAMUSCULAR | Status: DC | PRN
  Administered 2015-10-01: 2 mg via INTRAVENOUS
  Administered 2015-10-01 (×2): 1 mg via INTRAVENOUS

## 2015-10-01 MED ORDER — ONDANSETRON HCL 4 MG/2ML IJ SOLN
INTRAMUSCULAR | Status: AC
  Filled 2015-10-01: qty 2

## 2015-10-01 MED ORDER — MEPERIDINE HCL 100 MG/ML IJ SOLN
INTRAMUSCULAR | Status: AC
  Filled 2015-10-01: qty 2

## 2015-10-01 MED ORDER — MIDAZOLAM HCL 5 MG/5ML IJ SOLN
INTRAMUSCULAR | Status: AC
  Filled 2015-10-01: qty 10

## 2015-10-01 MED ORDER — ONDANSETRON HCL 4 MG/2ML IJ SOLN
INTRAMUSCULAR | Status: DC | PRN
  Administered 2015-10-01: 4 mg via INTRAVENOUS

## 2015-10-01 MED ORDER — SODIUM CHLORIDE 0.9 % IV SOLN
INTRAVENOUS | Status: DC
  Administered 2015-10-01: 09:00:00 via INTRAVENOUS

## 2015-10-01 MED ORDER — LIDOCAINE VISCOUS 2 % MT SOLN
OROMUCOSAL | Status: DC | PRN
  Administered 2015-10-01: 3 mL via OROMUCOSAL

## 2015-10-01 MED ORDER — MEPERIDINE HCL 100 MG/ML IJ SOLN
INTRAMUSCULAR | Status: DC | PRN
  Administered 2015-10-01: 25 mg via INTRAVENOUS

## 2015-10-01 MED ORDER — LIDOCAINE VISCOUS 2 % MT SOLN
OROMUCOSAL | Status: AC
  Filled 2015-10-01: qty 15

## 2015-10-01 NOTE — Telephone Encounter (Signed)
[name]

## 2015-10-01 NOTE — Telephone Encounter (Signed)
Candy, please refer pt.

## 2015-10-01 NOTE — Op Note (Signed)
[facility] Patient Name: [name] [name] Procedure Date: 10/01/2015 9:48 AM MRN: [id number] Date of Birth: 07/26/1932 Attending MD: Gennette Pac , MD CSN: [id number] Age: 82 Admit Type: Ambulatory Procedure:                Upper GI endoscopy Indications:              Dysphagia Providers:                Gennette Pac, MD, Brain Hilts, RN, Calton Dach, Technician Referring MD:              Medicines:                Midazolam 4 mg IV, Meperidine 25 mg IV, Ondansetron                            4 mg IV Complications:            No immediate complications. Estimated Blood Loss:     Estimated blood loss was minimal. Procedure:                Pre-Anesthesia Assessment:                           - Prior to the procedure, a History and Physical                            was performed, and patient medications and                            allergies were reviewed. The patient's tolerance of                            previous anesthesia was also reviewed. The risks                            and benefits of the procedure and the sedation                            options and risks were discussed with the patient.                            All questions were answered, and informed consent                            was obtained. Prior Anticoagulants: The patient has                            taken no previous anticoagulant or antiplatelet                            agents. ASA Grade Assessment: III - A patient with  severe systemic disease. After reviewing the risks                            and benefits, the patient was deemed in                            satisfactory condition to undergo the procedure.                           After obtaining informed consent, the endoscope was                            passed under direct vision. Throughout the                            procedure, the patient's blood pressure,  pulse, and                            oxygen saturations were monitored continuously. The                            [id number]) was introduced through the                            mouth, and advanced to the second part of duodenum.                            The upper GI endoscopy was accomplished without                            difficulty. The patient tolerated the procedure                            well. Scope In: 10:05:23 AM Scope Out: 10:13:17 AM Total Procedure Duration: 0 hours 7 minutes 54 seconds  Findings:      Mildly severe esophagitis with no bleeding was found 36 to 38 cm from       the incisors. The scope was withdrawn. .      Patient had four-quadrant exudates and areas of "kissing" ulceration       distal esophagus most consistent with pill-induced injury. Lumen of the       esophagus compromise with an elastic feel as the scope traversed the GE       junction. No fixed stricture or mass appreciated. No Barrett's       epithelium seen.      The scope was withdrawn. Dilation was performed with a Maloney dilator       with no resistance at 56 Fr. Dilation was performed with a Maloney       dilator with no resistance at 58 Fr Dilation was performed with a       Maloney dilator with mild resistance at 60 Fr. The dilation site was       examined following endoscope reinsertion and showed moderate improvement       in luminal narrowing. Estimated blood loss was minimal.      The entire examined stomach  was normal.      The second portion of the duodenum was normal. Impression:               - Mildly severe esophagitis. Likely pill-induced                            (Fosamax). Could be associated with other                            medications. Narrowed GE junction without fixed                            lesion found?"query achalasia. Status post Maloney                            dilation.                           - Normal stomach.                            - Normal second portion of the duodenum.                           - No specimens collected. Moderate Sedation:      Moderate (conscious) sedation was administered by the endoscopy nurse       and supervised by the endoscopist. The following parameters were       monitored: oxygen saturation, heart rate, blood pressure, respiratory       rate, EKG, adequacy of pulmonary ventilation, and response to care.       Total physician intraservice time was 20 minutes. Recommendation:           - Patient has a contact number available for                            emergencies. The signs and symptoms of potential                            delayed complications were discussed with the                            patient. Return to normal activities tomorrow.                            Written discharge instructions were provided to the                            patient.                           - Advance diet as tolerated. patient should stop                            Fosamax and touch base with Dr. Gerda DissLuking regarding  switching to a parenteral bisphosphonate.                            Swallowing precautions reviewed. Continue Protonix.                            Add Carafate suspension 4 times a day 2 weeks. Will                            pursue a manometry to further evaluate for the                            possibility of achalasia.                           - Continue present medications otherwise.                           - We will schedule a manometry in [place] in                            the near future. Procedure Code(s):        --- Professional ---                           [id number], Esophagogastroduodenoscopy, flexible,                            transoral; with dilation of gastric/duodenal                            stricture(s) (eg, balloon, bougie)                           43450, Dilation of esophagus, by unguided sound or                             bougie, single or multiple passes                           99152, Moderate sedation services provided by the                            same physician or other qualified health care                            professional performing the diagnostic or                            therapeutic service that the sedation supports,                            requiring the presence of an independent trained                            observer  to assist in the monitoring of the                            patient's level of consciousness and physiological                            status; initial 15 minutes of intraservice time,                            patient age 82 years or older Diagnosis Code(s):        --- Professional ---                           K20.8, Other esophagitis                           T66.XXXA, Radiation sickness, unspecified, initial                            encounter                           R13.10, Dysphagia, unspecified CPT copyright 2016 American Medical Association. All rights reserved. The codes documented in this report are preliminary and upon coder review may  be revised to meet current compliance requirements. Gerrit Friends. Lisamarie Coke, MD Gennette Pac, MD 10/01/2015 10:40:48 AM This report has been signed electronically. Number of Addenda: 0

## 2015-10-01 NOTE — H&P (View-Only) (Signed)
Primary Care Physician:  Laural Golden Primary Gastroenterologist:  Dr. Jena Gauss  Chief Complaint  Patient presents with  . Dysphagia  . Choking    HPI:   [name] [name] is a 82 y.o. male who presents For evaluation of "difficulty swallowing, choking." Last visit with our service was on 02/11/2010 for upper endoscopy. At that time it was noted he has a history of recurrent esophageal dysphagia. His EGD found focal narrowing at the GE junction without obvious ring, stricture, web, or other abnormality, no Barrett's or esophagitis. Noted small hiatal hernia, Lorcet patent. The GE junction narrowing was dilated with a 56 French Maloney dilator and subsequent 58 French Maloney dilator with noted disruption and minimal heme. Recommended continue omeprazole and follow-up appointment in one year. Unfortunately, he was lost to follow up as he was a no-show to his 1 year follow-up appointment.  Today he states he has gradually started having recurrent dysphagia symptoms over the past 3-4 years, getting progressively worse. Also with some hoarseness and more difficulty singing like he used to. Dysphagia with solid foods about every other day, also with some mild pill dysphagia and some occasional issues with liquids as well. Minimal to no GERD symptoms, not on PPI which was stopped about 1-2 years ago. Denies abdominal pain, N/V, melena. Has irregular bowel movements with some constipation on pain medication, occasional scant toilet tissue hematochezia and hemorrhoid flare-ups associated with his constipation. Is on Aleve daily for hip pain in addition to 5 mg Oxycodone. Takes milk of magnesia for constipation in addition to stool softener. Is interested in better management of OIC. Occasional dyspnea, sees PCP who is not concerned. 2D echo 2015 normal LV hypertrophy with normal EF, mild valvular regurgitation. EKG with AFib, LBBB. Denies chest pain, dizziness, lightheadedness, syncope, near syncope.  Denies any other upper or lower GI symptoms.  Past Medical History  Diagnosis Date  . Hypertension   . Alcohol abuse     stop drinking since 01/2009  . B12 deficiency 9/10    started injection   . COPD (chronic obstructive pulmonary disease) (HCC)   . Compression fracture   . Venous stasis     chronic   . BPH (benign prostatic hyperplasia)   . Nutcracker esophagus 2002    on EM  . Diverticula of colon     few small scattered in the sigmoid colon  . GERD (gastroesophageal reflux disease)   . H/O hiatal hernia   . Headache(784.0)   . Cancer (HCC) [facility]    BLADDER CANCER CELLS TX 2 MO AGO  . Chronic kidney disease     RECENT UTI FEW MONTHS AGO TX WITH ANTIBIOTIC  . Atrial fibrillation (HCC)     off coumadin secondary to fall and subdural  hematoma   . External hemorrhoids 09/2014  . Arthritis   . Reflux   . Asthma   . Osteoporosis   . Enlarged prostate   . Peripheral edema   . Carcinoma in situ of bladder     Past Surgical History  Procedure Laterality Date  . Hernia repair  12 YRS AGO    RIGHT SIDE   . Rhinoplasty  1974  . Lung biopsy  10 YRS AGO    BENIGN  . Joint replacement      RT HIP X2  (1 REPLACEMENT)  . Bladder aspiration      PROC FOR CANCER CELLS   . Cystoscopy  10/18/2011    Procedure: CYSTOSCOPY;  Surgeon:  Anner CreteJohn J Wrenn, MD;  Location: [facility];  Service: Urology;  Laterality: N/A;  . Cataract extraction w/phaco  07/30/2012    Procedure: CATARACT EXTRACTION PHACO AND INTRAOCULAR LENS PLACEMENT (IOC);  Surgeon: Susa Simmondsarroll F Haines, MD;  Location: [facility];  Service: Ophthalmology;  Laterality: Left;  CDE:31.66  . Cataract extraction w/phaco Right 11/12/2012    Procedure: CATARACT EXTRACTION PHACO AND INTRAOCULAR LENS PLACEMENT (IOC);  Surgeon: Susa Simmondsarroll F Haines, MD;  Location: [facility];  Service: Ophthalmology;  Laterality: Right;  CDE 18.25  . Esophagogastroduodenoscopy  02/11/2010    RMR: GE narrowing s/p dilation 56 & 58 Fr maloney otherwise normal    Current  Outpatient Prescriptions  Medication Sig Dispense Refill  . alendronate (FOSAMAX) 70 MG tablet Take 1 tablet (70 mg total) by mouth every 7 (seven) days. Take with a full glass of water on an empty stomach. 4 tablet 11  . aspirin 325 MG tablet Take 325 mg by mouth daily.    . Doxylamine Succinate, Sleep, (SLEEP AID PO) Take 1-2 tablets by mouth at bedtime as needed (sleep).    Marland Kitchen. escitalopram (LEXAPRO) 10 MG tablet Take 1 tablet (10 mg total) by mouth daily. 30 tablet 2  . furosemide (LASIX) 40 MG tablet Take 2 tablets every morning 60 tablet 0  . naproxen sodium (ANAPROX) 220 MG tablet Take 220 mg by mouth 2 (two) times daily with a meal.    . Niacin (VITAMIN B-3 PO) Take by mouth.    Marland Kitchen. OVER THE COUNTER MEDICATION Calcium    . OVER THE COUNTER MEDICATION Take 1 tablet by mouth daily. Vitamin D    . oxyCODONE-acetaminophen (ROXICET) 5-325 MG tablet Take 1 tablet by mouth every 8 (eight) hours as needed for severe pain. 90 tablet 0  . potassium chloride SA (KLOR-CON M20) 20 MEQ tablet Take 1 tablet (20 mEq total) by mouth daily. 30 tablet 0  . triamcinolone cream (KENALOG) 0.1 % Apply 1 application topically 2 (two) times daily. 60 g 0  . verapamil (CALAN-SR) 240 MG CR tablet Take 1 tablet (240 mg total) by mouth daily. 60 tablet 5  . pantoprazole (PROTONIX) 40 MG tablet Take 1 tablet (40 mg total) by mouth daily. 90 tablet 3   No current facility-administered medications for this visit.    Allergies as of 09/10/2015 - Review Complete 09/10/2015  Allergen Reaction Noted  . Oxycodone-acetaminophen Other (See Comments)   . Tramadol Itching   . Sulfonamide derivatives Hives     Family History  Problem Relation Age of Onset  . Cancer Brother   . Hypertension Mother   . Diabetes Mother   . Heart attack Mother     Social History   Social History  . Marital Status: Married    Spouse Name: N/A  . Number of Children: 2  . Years of Education: N/A   Occupational History  . Not on file.    Social History Main Topics  . Smoking status: Never Smoker   . Smokeless tobacco: Former NeurosurgeonUser    Types: Chew  . Alcohol Use: No     Comment: history of alcohol abuse stop drinking 8/10 . Alcohol abuse for about 25 years   . Drug Use: No  . Sexual Activity: Not on file   Other Topics Concern  . Not on file   Social History Narrative    Review of Systems: General: Negative for anorexia, weight loss, fever, chills, fatigue, weakness. Eyes: Negative for vision changes.  ENT: Positive hoarseness  and dysphagia. CV: Negative for chest pain, angina, palpitations, peripheral edema.  Respiratory: Negative for cough, sputum, wheezing.  GI: See history of present illness. MS: Chronic joint pain.  Derm: Negative for rash or itching.  Endo: Negative for unusual weight change.  Heme: Negative for bruising or bleeding. Allergy: Negative for rash or hives.    Physical Exam: BP 118/68 mmHg  Pulse 46  Temp(Src) 97 F (36.1 C)  Ht 5\' 11"  (1.803 m)  Wt 205 lb (92.987 kg)  BMI 28.60 kg/m2 General:   Alert and oriented. Pleasant and cooperative. Well-nourished and well-developed.  Head:  Normocephalic and atraumatic. Eyes:  Without icterus, sclera clear and conjunctiva pink.  Ears:  Minimally hard of hearing. Cardiovascular:  S1, S2 present without murmurs appreciated. Extremities without clubbing or edema. Respiratory:  Clear to auscultation bilaterally. No wheezes, rales, or rhonchi. No distress.  Gastrointestinal:  +BS, rounded but soft, non-tender and non-distended. No HSM noted. No guarding or rebound.  Rectal:  Deferred  Musculoskalatal:  Hobbling gait, ambulates with cane. Skin:  Intact without significant lesions or rashes. Neurologic:  Alert and oriented x4;  grossly normal neurologically. Psych:  Alert and cooperative. Normal mood and affect. Heme/Lymph/Immune: No excessive bruising noted.    09/10/2015 12:08 PM   Disclaimer: This note was dictated with voice  recognition software. Similar sounding words can inadvertently be transcribed and may not be corrected upon review.

## 2015-10-01 NOTE — Interval H&P Note (Signed)
History and Physical Interval Note:  10/01/2015 9:48 AM  [name] [name]  has presented today for surgery, with the diagnosis of dysphagia  The various methods of treatment have been discussed with the patient and family. After consideration of risks, benefits and other options for treatment, the patient has consented to  Procedure(s) with comments: ESOPHAGOGASTRODUODENOSCOPY (EGD) (N/A) - 0915 MALONEY DILATION (N/A) as a surgical intervention .  The patient's history has been reviewed, patient examined, no change in status, stable for surgery.  I have reviewed the patient's chart and labs.  Questions were answered to the patient's satisfaction.     [name] [name]  No change. EGD/ED per plan.  The risks, benefits, limitations, alternatives and imponderables have been reviewed with the patient. Potential for esophageal dilation, biopsy, etc. have also been reviewed.  Questions have been answered. All parties agreeable.

## 2015-10-01 NOTE — Telephone Encounter (Signed)
-----   Message from Corbin Adeobert M Rourk, MD sent at 10/01/2015 10:27 AM EDT ----- Patient needs a manometry to evaluate for achalasia. [facility]

## 2015-10-01 NOTE — Discharge Instructions (Signed)
EGD Discharge instructions Please read the instructions outlined below and refer to this sheet in the next few weeks. These discharge instructions provide you with general information on caring for yourself after you leave the hospital. Your doctor may also give you specific instructions. While your treatment has been planned according to the most current medical practices available, unavoidable complications occasionally occur. If you have any problems or questions after discharge, please call your doctor. ACTIVITY  You may resume your regular activity but move at a slower pace for the next 24 hours.   Take frequent rest periods for the next 24 hours.   Walking will help expel (get rid of) the air and reduce the bloated feeling in your abdomen.   No driving for 24 hours (because of the anesthesia (medicine) used during the test).   You may shower.   Do not sign any important legal documents or operate any machinery for 24 hours (because of the anesthesia used during the test).  NUTRITION  Drink plenty of fluids.   You may resume your normal diet.   Begin with a light meal and progress to your normal diet.   Avoid alcoholic beverages for 24 hours or as instructed by your caregiver.  MEDICATIONS  You may resume your normal medications unless your caregiver tells you otherwise.  WHAT YOU CAN EXPECT TODAY  You may experience abdominal discomfort such as a feeling of fullness or gas pains.  FOLLOW-UP  Your doctor will discuss the results of your test with you.  SEEK IMMEDIATE MEDICAL ATTENTION IF ANY OF THE FOLLOWING OCCUR:  Excessive nausea (feeling sick to your stomach) and/or vomiting.   Severe abdominal pain and distention (swelling).   Trouble swallowing.   Temperature over 101 F (37.8 C).   Rectal bleeding or vomiting of blood.    Continue Protonix daily  You should stop Fosamax; check with Dr. Ruthann CancerLukingng about an alternative medication that is not given by  mouth.  Mild office will schedule manometry (to check the pressures in your esophagus) in the near future. [place] [facility] [place].  Begin Carafate suspension 1 g 4 times daily for 2 weeks  Office visit with us in 6 weeks. On May 19th at 9 am with Dr. Jena Gaussourk

## 2015-10-05 ENCOUNTER — Encounter: Payer: Self-pay | Admitting: Family Medicine

## 2015-10-05 ENCOUNTER — Ambulatory Visit (INDEPENDENT_AMBULATORY_CARE_PROVIDER_SITE_OTHER): Payer: Medicare Other | Admitting: Family Medicine

## 2015-10-05 VITALS — BP 118/76 | Temp 98.7°F | Ht 71.0 in | Wt 207.0 lb

## 2015-10-05 DIAGNOSIS — M79671 Pain in right foot: Secondary | ICD-10-CM

## 2015-10-05 DIAGNOSIS — S91301S Unspecified open wound, right foot, sequela: Secondary | ICD-10-CM

## 2015-10-05 MED ORDER — DOXYCYCLINE HYCLATE 100 MG PO TABS: 100.0000 mg | ORAL_TABLET | Freq: Two times a day (BID) | ORAL | Status: DC

## 2015-10-05 NOTE — Progress Notes (Signed)
   Subjective:    Patient ID: [name] [name], male    DOB: 09/07/1932, 82 y.o.   MRN: [id number]  HPIRight foot pain and swelling. Shooting sharp pains in foot. Started 2 weeks ago. Finished Keflex 3 days ago.   Keflex seemed to help but now back to redness swelling and tenderness. Also notes ongoing ulceration. Also notes continued discharge clearish in nature from the ulceration. Next  Long-standing history of venous stasis dermatitis at times punctuated with blistering and serous fluid loss.  Constipation. Using otc stool softners.   Review of Systems No headache no chest pain no back pain    Objective:   Physical Exam Alert vital stable HET normal lungs clear heart regular rhythm legs positive blistering lesions noted bilateral ankles right dorsal foot erythema of the foot on some eschar at one site of the ulceration. Mild swelling. Diffuse mild pain no focal tenderness arterial pulses appear adequate       Assessment & Plan:  Impression persistent ulceration with venous stasis an element of cellulitis plan x-ray to rule out bone involvement. Wound care center referral rationale discussed antibiotics reinitiated wound care discussed complicated by patient's progressive forgetfulness and family dynamics. 25 minutes spent most in discussion WSL

## 2015-10-06 ENCOUNTER — Encounter (HOSPITAL_COMMUNITY): Payer: Self-pay | Admitting: Internal Medicine

## 2015-10-06 ENCOUNTER — Ambulatory Visit (HOSPITAL_COMMUNITY)
Admission: RE | Admit: 2015-10-06 | Discharge: 2015-10-06 | Disposition: A | Discharge status: Home / Self Care | Payer: Medicare Other | Source: Ambulatory Visit | Attending: Family Medicine | Admitting: Family Medicine

## 2015-10-06 DIAGNOSIS — M79671 Pain in right foot: Secondary | ICD-10-CM | POA: Insufficient documentation

## 2015-10-06 DIAGNOSIS — X58XXXS Exposure to other specified factors, sequela: Secondary | ICD-10-CM | POA: Insufficient documentation

## 2015-10-06 DIAGNOSIS — M7989 Other specified soft tissue disorders: Secondary | ICD-10-CM | POA: Diagnosis not present

## 2015-10-06 DIAGNOSIS — S91301S Unspecified open wound, right foot, sequela: Secondary | ICD-10-CM | POA: Diagnosis not present

## 2015-10-08 ENCOUNTER — Telehealth: Payer: Self-pay | Admitting: Internal Medicine

## 2015-10-08 MED ORDER — NALOXEGOL OXALATE 25 MG PO TABS: 25.0000 mg | ORAL_TABLET | Freq: Every day | ORAL | Status: DC

## 2015-10-08 NOTE — Telephone Encounter (Signed)
Routing to the refill box. 

## 2015-10-08 NOTE — Telephone Encounter (Signed)
Completed.

## 2015-10-08 NOTE — Telephone Encounter (Signed)
PATIENT CALLED AND STATED THAT THE MOVANTIC 25MG  SAMPLES HE WAS GIVEN ARE WORKING WELL AND HE WOULD LIKE A PRESCRIPTION CALLED INTO [facility]

## 2015-10-12 ENCOUNTER — Telehealth: Payer: Self-pay | Admitting: Internal Medicine

## 2015-10-12 NOTE — Telephone Encounter (Signed)
Routing to EG. Medication is too expensive and pt cannot use a copay card. He has medicare.

## 2015-10-12 NOTE — Telephone Encounter (Signed)
[phone]  PATIENT CALLED AND STATED THAT THE MEDS HE WAS PRESCRIBED FOR CONSTIPATION WERE 300$ AND HE NEEDS SOMETHING CHEAPER.  PLEASE ADVISE

## 2015-10-13 ENCOUNTER — Ambulatory Visit: Payer: Medicare Other | Admitting: Family Medicine

## 2015-10-13 DIAGNOSIS — I509 Heart failure, unspecified: Secondary | ICD-10-CM | POA: Diagnosis not present

## 2015-10-13 DIAGNOSIS — M810 Age-related osteoporosis without current pathological fracture: Secondary | ICD-10-CM | POA: Diagnosis not present

## 2015-10-13 DIAGNOSIS — L02611 Cutaneous abscess of right foot: Secondary | ICD-10-CM | POA: Diagnosis not present

## 2015-10-13 DIAGNOSIS — I70235 Atherosclerosis of native arteries of right leg with ulceration of other part of foot: Secondary | ICD-10-CM | POA: Diagnosis not present

## 2015-10-13 DIAGNOSIS — K219 Gastro-esophageal reflux disease without esophagitis: Secondary | ICD-10-CM | POA: Diagnosis not present

## 2015-10-13 DIAGNOSIS — J449 Chronic obstructive pulmonary disease, unspecified: Secondary | ICD-10-CM | POA: Diagnosis not present

## 2015-10-13 DIAGNOSIS — Z7982 Long term (current) use of aspirin: Secondary | ICD-10-CM | POA: Diagnosis not present

## 2015-10-13 DIAGNOSIS — I739 Peripheral vascular disease, unspecified: Secondary | ICD-10-CM | POA: Diagnosis not present

## 2015-10-13 DIAGNOSIS — Z882 Allergy status to sulfonamides status: Secondary | ICD-10-CM | POA: Diagnosis not present

## 2015-10-13 DIAGNOSIS — Z79899 Other long term (current) drug therapy: Secondary | ICD-10-CM | POA: Diagnosis not present

## 2015-10-13 DIAGNOSIS — L97519 Non-pressure chronic ulcer of other part of right foot with unspecified severity: Secondary | ICD-10-CM | POA: Diagnosis not present

## 2015-10-13 DIAGNOSIS — L02818 Cutaneous abscess of other sites: Secondary | ICD-10-CM | POA: Diagnosis not present

## 2015-10-13 DIAGNOSIS — Z791 Long term (current) use of non-steroidal anti-inflammatories (NSAID): Secondary | ICD-10-CM | POA: Diagnosis not present

## 2015-10-13 DIAGNOSIS — I8393 Asymptomatic varicose veins of bilateral lower extremities: Secondary | ICD-10-CM | POA: Diagnosis not present

## 2015-10-13 DIAGNOSIS — Z885 Allergy status to narcotic agent status: Secondary | ICD-10-CM | POA: Diagnosis not present

## 2015-10-13 DIAGNOSIS — L97511 Non-pressure chronic ulcer of other part of right foot limited to breakdown of skin: Secondary | ICD-10-CM | POA: Diagnosis not present

## 2015-10-13 DIAGNOSIS — I110 Hypertensive heart disease with heart failure: Secondary | ICD-10-CM | POA: Diagnosis not present

## 2015-10-13 NOTE — Telephone Encounter (Signed)
Failed Amitiza. Can we find out if it's just his plan that is that expensive and, if so, what are some cheaper options. If he's in the donut hole, nothing we Rx will be cheap. Thanks

## 2015-10-14 DIAGNOSIS — R1310 Dysphagia, unspecified: Secondary | ICD-10-CM | POA: Diagnosis not present

## 2015-10-14 NOTE — Telephone Encounter (Signed)
Sp[facility] with the [facility]ist at [facility]. She said the pt pays cash for all of his meds and he does not have prescription coverage. She said he could use a discount card because he pays cash but our discount cards only take off $75 and pt will still be paying $225.

## 2015-10-15 DIAGNOSIS — L97519 Non-pressure chronic ulcer of other part of right foot with unspecified severity: Secondary | ICD-10-CM | POA: Diagnosis not present

## 2015-10-15 DIAGNOSIS — I872 Venous insufficiency (chronic) (peripheral): Secondary | ICD-10-CM | POA: Diagnosis not present

## 2015-10-15 DIAGNOSIS — I10 Essential (primary) hypertension: Secondary | ICD-10-CM | POA: Diagnosis not present

## 2015-10-15 DIAGNOSIS — F17200 Nicotine dependence, unspecified, uncomplicated: Secondary | ICD-10-CM | POA: Diagnosis not present

## 2015-10-15 DIAGNOSIS — I771 Stricture of artery: Secondary | ICD-10-CM | POA: Diagnosis not present

## 2015-10-15 DIAGNOSIS — I739 Peripheral vascular disease, unspecified: Secondary | ICD-10-CM | POA: Diagnosis not present

## 2015-10-16 ENCOUNTER — Encounter: Payer: Self-pay | Admitting: Family Medicine

## 2015-10-16 ENCOUNTER — Ambulatory Visit (INDEPENDENT_AMBULATORY_CARE_PROVIDER_SITE_OTHER): Payer: Medicare Other | Admitting: Family Medicine

## 2015-10-16 VITALS — Temp 98.3°F | Ht 71.0 in | Wt 203.0 lb

## 2015-10-16 DIAGNOSIS — L03115 Cellulitis of right lower limb: Secondary | ICD-10-CM

## 2015-10-16 DIAGNOSIS — M79671 Pain in right foot: Secondary | ICD-10-CM

## 2015-10-16 MED ORDER — AMOXICILLIN-POT CLAVULANATE 875-125 MG PO TABS: 1.0000 | ORAL_TABLET | Freq: Two times a day (BID) | ORAL | Status: DC

## 2015-10-16 NOTE — Progress Notes (Signed)
   Subjective:    Patient ID: [name] [name], male    DOB: 01/23/1933, 82 y.o.   MRN: [id number]  HPIRight foot pain. Pt [place] care [place] in [place]. Taking doxy 100mg . Pt states foot is not getting better. Taking oxycodone for pain.  Patient states he has been seen by wound care management [place] in [place] they also did a circulatory testing which showed he has circulation issues in addition to this he denies fever chills relate some drainage at this source of these foot sores but not severe at the sores do not seem to be getting worse but just on seem to be getting better denies calf pain knee pain.   Review of Systems No fever no cough no vomiting or diarrhea relates foot pain and discomfort    Objective:   Physical Exam  The lower leg has some edema around the ankle but the calf is normal the knee is normal the foot has multiple sores that appear to be healing but there is some associated cellulitis with this      Assessment & Plan:  Switch in antibiotic indicated. May use pain medicine as necessary Keep follow-up with the wound center. Follow-up with Dr. Brett [name] if not improving over the next 7-10 days. Warnings discuss with the patient I see no evidence of necrosis of the foot or need for amputation

## 2015-10-20 DIAGNOSIS — Z882 Allergy status to sulfonamides status: Secondary | ICD-10-CM | POA: Diagnosis not present

## 2015-10-20 DIAGNOSIS — L02611 Cutaneous abscess of right foot: Secondary | ICD-10-CM | POA: Diagnosis not present

## 2015-10-20 DIAGNOSIS — M810 Age-related osteoporosis without current pathological fracture: Secondary | ICD-10-CM | POA: Diagnosis not present

## 2015-10-20 DIAGNOSIS — Z7982 Long term (current) use of aspirin: Secondary | ICD-10-CM | POA: Diagnosis not present

## 2015-10-20 DIAGNOSIS — I509 Heart failure, unspecified: Secondary | ICD-10-CM | POA: Diagnosis not present

## 2015-10-20 DIAGNOSIS — Z79899 Other long term (current) drug therapy: Secondary | ICD-10-CM | POA: Diagnosis not present

## 2015-10-20 DIAGNOSIS — L97519 Non-pressure chronic ulcer of other part of right foot with unspecified severity: Secondary | ICD-10-CM | POA: Diagnosis not present

## 2015-10-20 DIAGNOSIS — I8393 Asymptomatic varicose veins of bilateral lower extremities: Secondary | ICD-10-CM | POA: Diagnosis not present

## 2015-10-20 DIAGNOSIS — I739 Peripheral vascular disease, unspecified: Secondary | ICD-10-CM | POA: Diagnosis not present

## 2015-10-20 DIAGNOSIS — L97512 Non-pressure chronic ulcer of other part of right foot with fat layer exposed: Secondary | ICD-10-CM | POA: Diagnosis not present

## 2015-10-20 DIAGNOSIS — Z885 Allergy status to narcotic agent status: Secondary | ICD-10-CM | POA: Diagnosis not present

## 2015-10-20 DIAGNOSIS — Z791 Long term (current) use of non-steroidal anti-inflammatories (NSAID): Secondary | ICD-10-CM | POA: Diagnosis not present

## 2015-10-20 DIAGNOSIS — K219 Gastro-esophageal reflux disease without esophagitis: Secondary | ICD-10-CM | POA: Diagnosis not present

## 2015-10-20 DIAGNOSIS — I110 Hypertensive heart disease with heart failure: Secondary | ICD-10-CM | POA: Diagnosis not present

## 2015-10-20 DIAGNOSIS — J449 Chronic obstructive pulmonary disease, unspecified: Secondary | ICD-10-CM | POA: Diagnosis not present

## 2015-10-21 ENCOUNTER — Ambulatory Visit (INDEPENDENT_AMBULATORY_CARE_PROVIDER_SITE_OTHER): Payer: Medicare Other | Admitting: Vascular Surgery

## 2015-10-21 ENCOUNTER — Encounter: Payer: Self-pay | Admitting: Vascular Surgery

## 2015-10-21 ENCOUNTER — Other Ambulatory Visit: Payer: Self-pay

## 2015-10-21 VITALS — BP 138/71 | HR 44 | Temp 97.5°F | Ht 71.0 in | Wt 202.0 lb

## 2015-10-21 DIAGNOSIS — I70223 Atherosclerosis of native arteries of extremities with rest pain, bilateral legs: Secondary | ICD-10-CM

## 2015-10-21 DIAGNOSIS — I70229 Atherosclerosis of native arteries of extremities with rest pain, unspecified extremity: Secondary | ICD-10-CM | POA: Insufficient documentation

## 2015-10-21 NOTE — Progress Notes (Signed)
Referred by:  Sinda Du, MD [address] [place], [place] [address]  Reason for referral: bilateral leg pain  History of Present Illness  [name] [name] is a 82 y.o. (1932-12-31) male who presents with chief complaint: bilateral leg pain.   History somewhat limited due to memory limitations.  Onset of symptom occurred >6 months ago after hip surgery.  Pain is described as aching, severity 3-6/10, and associated with short distance claudication.  Patient has attempted to treat this pain with narcotics and wound care.  The patient has rest pain symptoms also and R>>L foot ulcer.  This patient was referred to this practice from the wound care center which recently drainaged an abscess from the plantar surface of the right foot.  Atherosclerotic risk factors include: HTN, prior smoker.  Past Medical History  Diagnosis Date  . Hypertension   . Alcohol abuse     stop drinking since 01/2009  . B12 deficiency 9/10    started injection   . COPD (chronic obstructive pulmonary disease) (HCC)   . Compression fracture   . Venous stasis     chronic   . BPH (benign prostatic hyperplasia)   . Nutcracker esophagus 2002    on EM  . Diverticula of colon     few small scattered in the sigmoid colon  . GERD (gastroesophageal reflux disease)   . H/O hiatal hernia   . Headache(784.0)   . Cancer (HCC) [facility]    BLADDER CANCER CELLS TX 2 MO AGO  . Chronic kidney disease     RECENT UTI FEW MONTHS AGO TX WITH ANTIBIOTIC  . Atrial fibrillation (HCC)     off coumadin secondary to fall and subdural  hematoma   . External hemorrhoids 09/2014  . Arthritis   . Reflux   . Asthma   . Osteoporosis   . Enlarged prostate   . Peripheral edema   . Carcinoma in situ of bladder     Past Surgical History  Procedure Laterality Date  . Hernia repair  12 YRS AGO    RIGHT SIDE   . Rhinoplasty  1974  . Lung biopsy  10 YRS AGO    BENIGN  . Joint replacement      RT HIP X2  (1 REPLACEMENT)  . Bladder aspiration       PROC FOR CANCER CELLS   . Cystoscopy  10/18/2011    Procedure: CYSTOSCOPY;  Surgeon: Anner Crete, MD;  Location: [facility];  Service: Urology;  Laterality: N/A;  . Cataract extraction w/phaco  07/30/2012    Procedure: CATARACT EXTRACTION PHACO AND INTRAOCULAR LENS PLACEMENT (IOC);  Surgeon: Susa Simmonds, MD;  Location: [facility];  Service: Ophthalmology;  Laterality: Left;  CDE:31.66  . Cataract extraction w/phaco Right 11/12/2012    Procedure: CATARACT EXTRACTION PHACO AND INTRAOCULAR LENS PLACEMENT (IOC);  Surgeon: Susa Simmonds, MD;  Location: [facility];  Service: Ophthalmology;  Laterality: Right;  CDE 18.25  . Esophagogastroduodenoscopy  02/11/2010    RMR: GE narrowing s/p dilation 56 & 58 Fr maloney otherwise normal  . Esophagogastroduodenoscopy N/A 10/01/2015    Procedure: ESOPHAGOGASTRODUODENOSCOPY (EGD);  Surgeon: Corbin Ade, MD;  Location: [facility];  Service: Endoscopy;  Laterality: N/A;  0915  . Maloney dilation N/A 10/01/2015    Procedure: [name] DILATION;  Surgeon: Corbin Ade, MD;  Location: [facility];  Service: Endoscopy;  Laterality: N/A;    Social History   Social History  .  Marital Status: Married    Spouse Name: N/A  . Number of Children: 2  . Years of Education: N/A   Occupational History  . Not on file.   Social History Main Topics  . Smoking status: Never Smoker   . Smokeless tobacco: Former NeurosurgeonUser    Types: Chew    Quit date: 06/28/1991  . Alcohol Use: No     Comment: history of alcohol abuse stop drinking 8/10 . Alcohol abuse for about 25 years   . Drug Use: No  . Sexual Activity: Not on file   Other Topics Concern  . Not on file   Social History Narrative    Family History  Problem Relation Age of Onset  . Cancer Brother   . Hypertension Mother   . Diabetes Mother   . Heart attack Mother   . Heart disease Mother     Current Outpatient Prescriptions  Medication Sig Dispense Refill  . alendronate (FOSAMAX) 70 MG tablet Take 1 tablet  (70 mg total) by mouth every 7 (seven) days. Take with a full glass of water on an empty stomach. 4 tablet 11  . amoxicillin-clavulanate (AUGMENTIN) 875-125 MG tablet Take 1 tablet by mouth 2 (two) times daily. 20 tablet 0  . aspirin 325 MG tablet Take 325 mg by mouth daily.    . Doxylamine Succinate, Sleep, (SLEEP AID PO) Take 1-2 tablets by mouth at bedtime as needed (sleep).    Marland Kitchen. escitalopram (LEXAPRO) 10 MG tablet Take 1 tablet (10 mg total) by mouth daily. 30 tablet 2  . furosemide (LASIX) 40 MG tablet Take 2 tablets every morning 60 tablet 0  . naloxegol oxalate (MOVANTIK) 25 MG TABS tablet Take 1 tablet (25 mg total) by mouth daily. 30 tablet 5  . naproxen sodium (ANAPROX) 220 MG tablet Take 220 mg by mouth 2 (two) times daily with a meal.    . Niacin (VITAMIN B-3 PO) Take by mouth.    . Omega-3 Fatty Acids (FISH OIL PO) Take by mouth.    Marland Kitchen. OVER THE COUNTER MEDICATION Calcium    . OVER THE COUNTER MEDICATION Take 1 tablet by mouth daily. Vitamin D    . oxyCODONE-acetaminophen (ROXICET) 5-325 MG tablet Take 1 tablet by mouth every 8 (eight) hours as needed for severe pain. 90 tablet 0  . pantoprazole (PROTONIX) 40 MG tablet Take 1 tablet (40 mg total) by mouth daily. 90 tablet 3  . potassium chloride SA (KLOR-CON M20) 20 MEQ tablet Take 1 tablet (20 mEq total) by mouth daily. 30 tablet 0  . verapamil (CALAN-SR) 240 MG CR tablet Take 1 tablet (240 mg total) by mouth daily. 60 tablet 5   No current facility-administered medications for this visit.    Allergies  Allergen Reactions  . Oxycodone-Acetaminophen Other (See Comments)    Reaction:unknown reaction many yrs ago  . Tramadol Itching  . Sulfonamide Derivatives Hives     REVIEW OF SYSTEMS:  (Positives checked otherwise negative)  CARDIOVASCULAR:   [ ]  chest pain,  [ ]  chest pressure,  [ ]  palpitations,  [x]  shortness of breath when laying flat,  [ ]  shortness of breath with exertion,   [x]  pain in feet when walking,  [x]   pain in feet when laying flat, [ ]  history of blood clot in veins (DVT),  [ ]  history of phlebitis,  [x]  swelling in legs,  [x]  varicose veins  PULMONARY:   [ ]  productive cough,  [ ]  asthma,  [ ]  wheezing  NEUROLOGIC:    weakness in arms or legs,   numbness in arms or legs,   difficulty speaking or slurred speech,   temporary loss of vision in one eye,   dizziness  HEMATOLOGIC:    bleeding problems,   problems with blood clotting too easily  MUSCULOSKEL:    joint pain,  joint swelling  GASTROINTEST:    vomiting blood,   blood in stool    constipated   GENITOURINARY:    burning with urination,   blood in urine  PSYCHIATRIC:    history of major depression  INTEGUMENTARY:    rashes,   ulcers  CONSTITUTIONAL:    fever,   chills   For VQI Use Only  PRE-ADM LIVING: Home  AMB STATUS: Ambulatory  CAD Sx: None  PRIOR CHF: None  STRESS TEST:  No,  Normal,  + ischemia,  + MI,  Both   Physical Examination  Filed Vitals:   10/21/15 0824  BP: 138/71  Pulse: 44  Temp: 97.5 F (36.4 C)  TempSrc: Oral  Height:  (1.803 m)  Weight: 202 lb (91.627 kg)  SpO2: 97%   Body mass index is 28.19 kg/(m^2).  General: A&O x 3, WD, Obese,   Head: [place]/AT  Ear/Nose/Throat: Hearing grossly intact, nares w/o erythema or drainage, oropharynx w/o Erythema/Exudate, Mallampati score: 3  Eyes: PERRLA, EOMI  Neck: Supple, no nuchal rigidity, palpable LAD, kyphotic upper T/C spince  Pulmonary: Sym exp, good air movt, CTAB, no rales, rhonchi, & wheezing  Cardiac: RRR, Nl S1, S2, no Murmurs, rubs or gallops  Vascular: Vessel Right Left  Radial Faintly Palpable Faintly Palpable  Ulnar Not Palpable Not Palpable  Brachial Palpable Palpable  Carotid Palpable, without bruit Palpable, without bruit  Aorta Not palpable N/A  Femoral Faintly Palpable Palpable  Popliteal Not palpable Not palpable    PT Not Palpable Not Palpable  DP Not Palpable Not Palpable   Gastrointestinal: soft, NTND, -G/R, - HSM, - masses, - CVAT B  Musculoskeletal: M/S 5/5 throughout , B stasis dermatitis, cyanotic feet, Multiple ulcers in R foot, L 4th distal phalange ischemic  Neurologic: CN 2-12 intact , Pain and light touch intact in extremities including both feet, Motor exam as listed above  Psychiatric: Judgment intact, Mood & affect appropriate for pt's clinical situation  Dermatologic: See M/S exam for extremity exam, no rashes otherwise noted  Lymph : No Cervical, Axillary, or Inguinal lymphadenopathy    Non-Invasive Vascular Imaging  Outside ABI (Date: 10/15/15)  R: 0.18, DP: not recorded, PT: damp mono, TBI: 0  L: 0.58, DP: not recorded, PT: damp mono, TBI: 0.14   Outside Studies/Documentation 5 pages of outside documents were reviewed including: outpatient ABI and wound center notes.   Medical Decision Making  [name] [name] is a 81 y.o. male who presents with: BLE critical limb ischemia with right foot ischemic ulcers and left foot ischemic digit   I discussed with the patient the natural history of critical limb ischemia: 25% require amputation in one year, 50% are able to maintain their limbs in one year, and 25-30% die in one year due to comorbidities.  Given the limb threatening status of this patient, I recommend an aggressive work up including proceeding with an: Aortogram, Bilateral runoff and possible intervention. I discussed with the patient the nature of angiographic procedures, especially the limited patencies of any endovascular  intervention. The patient is aware of that the risks of an angiographic procedure include but are not limited to: bleeding, infection, access site complications, embolization, rupture of treated vessel, dissection, possible need for emergent surgical intervention, and possible need for surgical procedures to treat the patient's pathology. The patient  is aware of the risks and agrees to proceed.  The procedure is scheduled for: 10/23/15 with Dr. Darrick Penna.  I discussed in depth with the patient the nature of atherosclerosis, and emphasized the importance of maximal medical management including strict control of blood pressure, blood glucose, and lipid levels, antiplatelet agents, obtaining regular exercise, and cessation of smoking.  The patient is aware that without maximal medical management the underlying atherosclerotic disease process will progress, limiting the benefit of any interventions. The patient is currently on a statin:  Reportedly not indicated. The patient is currently on an anti-platelet: ASA.  Thank you for allowing [place] to participate in this patient's care.   Leonides Sake, MD [facility] Office: [phone] Pager: [phone]  10/21/2015, 9:00 AM

## 2015-10-22 ENCOUNTER — Encounter: Payer: Self-pay | Admitting: Surgery

## 2015-10-23 ENCOUNTER — Encounter (HOSPITAL_COMMUNITY)
Admission: RE | Disposition: A | Discharge status: Home / Self Care | Payer: Self-pay | Source: Ambulatory Visit | Attending: Vascular Surgery

## 2015-10-23 ENCOUNTER — Encounter (HOSPITAL_COMMUNITY): Payer: Self-pay | Admitting: Vascular Surgery

## 2015-10-23 ENCOUNTER — Ambulatory Visit (HOSPITAL_COMMUNITY)
Admission: RE | Admit: 2015-10-23 | Discharge: 2015-10-23 | Disposition: A | Discharge status: Home / Self Care | Payer: Medicare Other | Source: Ambulatory Visit | Attending: Vascular Surgery | Admitting: Vascular Surgery

## 2015-10-23 DIAGNOSIS — I70235 Atherosclerosis of native arteries of right leg with ulceration of other part of foot: Secondary | ICD-10-CM | POA: Diagnosis not present

## 2015-10-23 DIAGNOSIS — N189 Chronic kidney disease, unspecified: Secondary | ICD-10-CM | POA: Diagnosis not present

## 2015-10-23 DIAGNOSIS — N400 Enlarged prostate without lower urinary tract symptoms: Secondary | ICD-10-CM | POA: Diagnosis not present

## 2015-10-23 DIAGNOSIS — E538 Deficiency of other specified B group vitamins: Secondary | ICD-10-CM | POA: Diagnosis not present

## 2015-10-23 DIAGNOSIS — Z7982 Long term (current) use of aspirin: Secondary | ICD-10-CM | POA: Diagnosis not present

## 2015-10-23 DIAGNOSIS — J45909 Unspecified asthma, uncomplicated: Secondary | ICD-10-CM | POA: Diagnosis not present

## 2015-10-23 DIAGNOSIS — Z8249 Family history of ischemic heart disease and other diseases of the circulatory system: Secondary | ICD-10-CM | POA: Insufficient documentation

## 2015-10-23 DIAGNOSIS — J449 Chronic obstructive pulmonary disease, unspecified: Secondary | ICD-10-CM | POA: Diagnosis not present

## 2015-10-23 DIAGNOSIS — Z87891 Personal history of nicotine dependence: Secondary | ICD-10-CM | POA: Diagnosis not present

## 2015-10-23 DIAGNOSIS — L97519 Non-pressure chronic ulcer of other part of right foot with unspecified severity: Secondary | ICD-10-CM | POA: Insufficient documentation

## 2015-10-23 DIAGNOSIS — M1990 Unspecified osteoarthritis, unspecified site: Secondary | ICD-10-CM | POA: Insufficient documentation

## 2015-10-23 DIAGNOSIS — I70202 Unspecified atherosclerosis of native arteries of extremities, left leg: Secondary | ICD-10-CM | POA: Diagnosis not present

## 2015-10-23 DIAGNOSIS — K219 Gastro-esophageal reflux disease without esophagitis: Secondary | ICD-10-CM | POA: Insufficient documentation

## 2015-10-23 DIAGNOSIS — I878 Other specified disorders of veins: Secondary | ICD-10-CM | POA: Diagnosis not present

## 2015-10-23 DIAGNOSIS — M810 Age-related osteoporosis without current pathological fracture: Secondary | ICD-10-CM | POA: Diagnosis not present

## 2015-10-23 DIAGNOSIS — I4891 Unspecified atrial fibrillation: Secondary | ICD-10-CM | POA: Insufficient documentation

## 2015-10-23 DIAGNOSIS — I129 Hypertensive chronic kidney disease with stage 1 through stage 4 chronic kidney disease, or unspecified chronic kidney disease: Secondary | ICD-10-CM | POA: Insufficient documentation

## 2015-10-23 HISTORY — PX: PERIPHERAL VASCULAR CATHETERIZATION: SHX172C

## 2015-10-23 LAB — POCT I-STAT, CHEM 8
BUN: 31 mg/dL — ABNORMAL HIGH (ref 6–20)
Calcium, Ion: 1.16 mmol/L (ref 1.13–1.30)
Chloride: 103 mmol/L (ref 101–111)
Creatinine, Ser: 1 mg/dL (ref 0.61–1.24)
Glucose, Bld: 95 mg/dL (ref 65–99)
HEMATOCRIT: 38 % — AB (ref 39.0–52.0)
Hemoglobin: 12.9 g/dL — ABNORMAL LOW (ref 13.0–17.0)
Potassium: 5 mmol/L (ref 3.5–5.1)
SODIUM: 138 mmol/L (ref 135–145)
TCO2: 26 mmol/L (ref 0–100)

## 2015-10-23 SURGERY — ABDOMINAL AORTOGRAM W/LOWER EXTREMITY
Anesthesia: LOCAL

## 2015-10-23 MED ORDER — METOPROLOL TARTRATE 5 MG/5ML IV SOLN: 2.0000 mg | INTRAVENOUS | Status: DC | PRN

## 2015-10-23 MED ORDER — FENTANYL CITRATE (PF) 100 MCG/2ML IJ SOLN
INTRAMUSCULAR | Status: AC
  Filled 2015-10-23: qty 2

## 2015-10-23 MED ORDER — HEPARIN (PORCINE) IN NACL 2-0.9 UNIT/ML-% IJ SOLN
INTRAMUSCULAR | Status: DC | PRN
  Administered 2015-10-23: 1000 mL

## 2015-10-23 MED ORDER — FENTANYL CITRATE (PF) 100 MCG/2ML IJ SOLN
25.0000 ug | INTRAMUSCULAR | Status: DC | PRN
  Administered 2015-10-23: 25 ug via INTRAVENOUS

## 2015-10-23 MED ORDER — LABETALOL HCL 5 MG/ML IV SOLN: 10.0000 mg | INTRAVENOUS | Status: DC | PRN

## 2015-10-23 MED ORDER — LIDOCAINE HCL (PF) 1 % IJ SOLN
INTRAMUSCULAR | Status: AC
  Filled 2015-10-23: qty 30

## 2015-10-23 MED ORDER — LIDOCAINE HCL (PF) 1 % IJ SOLN
INTRAMUSCULAR | Status: DC | PRN
  Administered 2015-10-23: 20 mL

## 2015-10-23 MED ORDER — FENTANYL CITRATE (PF) 100 MCG/2ML IJ SOLN
INTRAMUSCULAR | Status: DC | PRN
  Administered 2015-10-23: 25 ug via INTRAVENOUS

## 2015-10-23 MED ORDER — HYDRALAZINE HCL 20 MG/ML IJ SOLN: 5.0000 mg | INTRAMUSCULAR | Status: DC | PRN

## 2015-10-23 MED ORDER — SODIUM CHLORIDE 0.45 % IV SOLN
INTRAVENOUS | Status: DC
  Administered 2015-10-23: 75 mL/h via INTRAVENOUS

## 2015-10-23 MED ORDER — IODIXANOL 320 MG/ML IV SOLN
INTRAVENOUS | Status: DC | PRN
  Administered 2015-10-23: 133 mL via INTRA_ARTERIAL

## 2015-10-23 MED ORDER — ACETAMINOPHEN 325 MG RE SUPP
325.0000 mg | RECTAL | Status: DC | PRN
  Filled 2015-10-23: qty 2

## 2015-10-23 MED ORDER — SODIUM CHLORIDE 0.9 % IV SOLN
INTRAVENOUS | Status: DC
  Administered 2015-10-23: 06:00:00 via INTRAVENOUS

## 2015-10-23 MED ORDER — HEPARIN (PORCINE) IN NACL 2-0.9 UNIT/ML-% IJ SOLN
INTRAMUSCULAR | Status: AC
  Filled 2015-10-23: qty 1000

## 2015-10-23 MED ORDER — ACETAMINOPHEN 325 MG PO TABS
325.0000 mg | ORAL_TABLET | ORAL | Status: DC | PRN
  Filled 2015-10-23: qty 2

## 2015-10-23 MED ORDER — ALUM & MAG HYDROXIDE-SIMETH 200-200-20 MG/5ML PO SUSP
15.0000 mL | ORAL | Status: DC | PRN
  Filled 2015-10-23: qty 30

## 2015-10-23 MED ORDER — ONDANSETRON HCL 4 MG/2ML IJ SOLN: 4.0000 mg | Freq: Four times a day (QID) | INTRAMUSCULAR | Status: DC | PRN

## 2015-10-23 SURGICAL SUPPLY — 12 items
CATH ANGIO 5F PIGTAIL 65CM (CATHETERS) ×1 IMPLANT
COVER PRB 48X5XTLSCP FOLD TPE (BAG) IMPLANT
COVER PROBE 5X48 (BAG) ×2
DEVICE CLOSURE PERCLS PRGLD 6F (VASCULAR PRODUCTS) IMPLANT
KIT PV (KITS) ×2 IMPLANT
PERCLOSE PROGLIDE 6F (VASCULAR PRODUCTS) ×2
SHEATH PINNACLE 5F 10CM (SHEATH) ×1 IMPLANT
SHEATH PINNACLE 6F 10CM (SHEATH) ×1 IMPLANT
SYR MEDRAD MARK V 150ML (SYRINGE) ×2 IMPLANT
TRANSDUCER W/STOPCOCK (MISCELLANEOUS) ×2 IMPLANT
TRAY PV CATH (CUSTOM PROCEDURE TRAY) ×2 IMPLANT
WIRE HITORQ VERSACORE ST 145CM (WIRE) ×2 IMPLANT

## 2015-10-23 NOTE — Interval H&P Note (Signed)
History and Physical Interval Note:  10/23/2015 7:31 AM  [name] [name]  has presented today for surgery, with the diagnosis of pvd with right foot ulcer  The various methods of treatment have been discussed with the patient and family. After consideration of risks, benefits and other options for treatment, the patient has consented to  Procedure(s): Abdominal Aortogram w/Lower Extremity (N/A) as a surgical intervention .  The patient's history has been reviewed, patient examined, no change in status, stable for surgery.  I have reviewed the patient's chart and labs.  Questions were answered to the patient's satisfaction.     Fabienne BrunsFields, Deeandra Jerry

## 2015-10-23 NOTE — H&P (View-Only) (Signed)
  Referred by:  Harold A Nichols, MD [address] [place], [place] [address]  Reason for referral: bilateral leg pain  History of Present Illness  [name] [name] is a 82 y.o. (01/24/1933) male who presents with chief complaint: bilateral leg pain.   History somewhat limited due to memory limitations.  Onset of symptom occurred >6 months ago after hip surgery.  Pain is described as aching, severity 3-6/10, and associated with short distance claudication.  Patient has attempted to treat this pain with narcotics and wound care.  The patient has rest pain symptoms also and R>>L foot ulcer.  This patient was referred to this practice from the wound care center which recently drainaged an abscess from the plantar surface of the right foot.  Atherosclerotic risk factors include: HTN, prior smoker.  Past Medical History  Diagnosis Date  . Hypertension   . Alcohol abuse     stop drinking since 01/2009  . B12 deficiency 9/10    started injection   . COPD (chronic obstructive pulmonary disease) (HCC)   . Compression fracture   . Venous stasis     chronic   . BPH (benign prostatic hyperplasia)   . Nutcracker esophagus 2002    on EM  . Diverticula of colon     few small scattered in the sigmoid colon  . GERD (gastroesophageal reflux disease)   . H/O hiatal hernia   . Headache(784.0)   . Cancer (HCC) [facility]    BLADDER CANCER CELLS TX 2 MO AGO  . Chronic kidney disease     RECENT UTI FEW MONTHS AGO TX WITH ANTIBIOTIC  . Atrial fibrillation (HCC)     off coumadin secondary to fall and subdural  hematoma   . External hemorrhoids 09/2014  . Arthritis   . Reflux   . Asthma   . Osteoporosis   . Enlarged prostate   . Peripheral edema   . Carcinoma in situ of bladder     Past Surgical History  Procedure Laterality Date  . Hernia repair  12 YRS AGO    RIGHT SIDE   . Rhinoplasty  1974  . Lung biopsy  10 YRS AGO    BENIGN  . Joint replacement      RT HIP X2  (1 REPLACEMENT)  . Bladder aspiration       PROC FOR CANCER CELLS   . Cystoscopy  10/18/2011    Procedure: CYSTOSCOPY;  Surgeon: [name] J Wrenn, MD;  Location: [facility];  Service: Urology;  Laterality: N/A;  . Cataract extraction w/phaco  07/30/2012    Procedure: CATARACT EXTRACTION PHACO AND INTRAOCULAR LENS PLACEMENT (IOC);  Surgeon: Carroll F Haines, MD;  Location: [facility];  Service: Ophthalmology;  Laterality: Left;  CDE:31.66  . Cataract extraction w/phaco Right 11/12/2012    Procedure: CATARACT EXTRACTION PHACO AND INTRAOCULAR LENS PLACEMENT (IOC);  Surgeon: Carroll F Haines, MD;  Location: [facility];  Service: Ophthalmology;  Laterality: Right;  CDE 18.25  . Esophagogastroduodenoscopy  02/11/2010    RMR: GE narrowing s/p dilation 56 & 58 Fr maloney otherwise normal  . Esophagogastroduodenoscopy N/A 10/01/2015    Procedure: ESOPHAGOGASTRODUODENOSCOPY (EGD);  Surgeon: Robert M Rourk, MD;  Location[facility];  Service: Endoscopy;  Laterality: N/A;  0915  . Maloney dilation N/A 10/01/2015    Procedure: MALONEY DILATION;  Surgeon: Robert M Rourk, MD;  Location[facility];  Service: Endoscopy;  Laterality: N/A;    Social History   Social History  .   Marital Status: Married    Spouse Name: N/A  . Number of Children: 2  . Years of Education: N/A   Occupational History  . Not on file.   Social History Main Topics  . Smoking status: Never Smoker   . Smokeless tobacco: Former User    Types: Chew    Quit date: 06/28/1991  . Alcohol Use: No     Comment: history of alcohol abuse stop drinking 8/10 . Alcohol abuse for about 25 years   . Drug Use: No  . Sexual Activity: Not on file   Other Topics Concern  . Not on file   Social History Narrative    Family History  Problem Relation Age of Onset  . Cancer Brother   . Hypertension Mother   . Diabetes Mother   . Heart attack Mother   . Heart disease Mother     Current Outpatient Prescriptions  Medication Sig Dispense Refill  . alendronate (FOSAMAX) 70 MG tablet Take 1 tablet  (70 mg total) by mouth every 7 (seven) days. Take with a full glass of water on an empty stomach. 4 tablet 11  . amoxicillin-clavulanate (AUGMENTIN) 875-125 MG tablet Take 1 tablet by mouth 2 (two) times daily. 20 tablet 0  . aspirin 325 MG tablet Take 325 mg by mouth daily.    . Doxylamine Succinate, Sleep, (SLEEP AID PO) Take 1-2 tablets by mouth at bedtime as needed (sleep).    . escitalopram (LEXAPRO) 10 MG tablet Take 1 tablet (10 mg total) by mouth daily. 30 tablet 2  . furosemide (LASIX) 40 MG tablet Take 2 tablets every morning 60 tablet 0  . naloxegol oxalate (MOVANTIK) 25 MG TABS tablet Take 1 tablet (25 mg total) by mouth daily. 30 tablet 5  . naproxen sodium (ANAPROX) 220 MG tablet Take 220 mg by mouth 2 (two) times daily with a meal.    . Niacin (VITAMIN B-3 PO) Take by mouth.    . Omega-3 Fatty Acids (FISH OIL PO) Take by mouth.    . OVER THE COUNTER MEDICATION Calcium    . OVER THE COUNTER MEDICATION Take 1 tablet by mouth daily. Vitamin D    . oxyCODONE-acetaminophen (ROXICET) 5-325 MG tablet Take 1 tablet by mouth every 8 (eight) hours as needed for severe pain. 90 tablet 0  . pantoprazole (PROTONIX) 40 MG tablet Take 1 tablet (40 mg total) by mouth daily. 90 tablet 3  . potassium chloride SA (KLOR-CON M20) 20 MEQ tablet Take 1 tablet (20 mEq total) by mouth daily. 30 tablet 0  . verapamil (CALAN-SR) 240 MG CR tablet Take 1 tablet (240 mg total) by mouth daily. 60 tablet 5   No current facility-administered medications for this visit.    Allergies  Allergen Reactions  . Oxycodone-Acetaminophen Other (See Comments)    Reaction:unknown reaction many yrs ago  . Tramadol Itching  . Sulfonamide Derivatives Hives     REVIEW OF SYSTEMS:  (Positives checked otherwise negative)  CARDIOVASCULAR:   [ ] chest pain,  [ ] chest pressure,  [ ] palpitations,  [x] shortness of breath when laying flat,  [ ] shortness of breath with exertion,   [x] pain in feet when walking,  [x]  pain in feet when laying flat, [ ] history of blood clot in veins (DVT),  [ ] history of phlebitis,  [x] swelling in legs,  [x] varicose veins  PULMONARY:   [ ] productive cough,  [ ] asthma,  [ ] wheezing    NEUROLOGIC:   [x] weakness in arms or legs,  [ ] numbness in arms or legs,  [ ] difficulty speaking or slurred speech,  [ ] temporary loss of vision in one eye,  [x] dizziness  HEMATOLOGIC:   [ ] bleeding problems,  [ ] problems with blood clotting too easily  MUSCULOSKEL:   [ ] joint pain, [ ] joint swelling  GASTROINTEST:   [ ] vomiting blood,  [ ] blood in stool   [x] constipated   GENITOURINARY:   [ ] burning with urination,  [ ] blood in urine  PSYCHIATRIC:   [ ] history of major depression  INTEGUMENTARY:   [ ] rashes,  [ ] ulcers  CONSTITUTIONAL:   [ ] fever,  [ ] chills   For VQI Use Only  PRE-ADM LIVING: Home  AMB STATUS: Ambulatory  CAD Sx: None  PRIOR CHF: None  STRESS TEST: [x] No, [ ] Normal, [ ] + ischemia, [ ] + MI, [ ] Both   Physical Examination  Filed Vitals:   10/21/15 0824  BP: 138/71  Pulse: 44  Temp: 97.5 F (36.4 C)  TempSrc: Oral  Height: 5' 11" (1.803 m)  Weight: 202 lb (91.627 kg)  SpO2: 97%   Body mass index is 28.19 kg/(m^2).  General: A&O x 3, WD, Obese,   Head: [place]/AT  Ear/Nose/Throat: Hearing grossly intact, nares w/o erythema or drainage, oropharynx w/o Erythema/Exudate, Mallampati score: 3  Eyes: PERRLA, EOMI  Neck: Supple, no nuchal rigidity, palpable LAD, kyphotic upper T/C spince  Pulmonary: Sym exp, good air movt, CTAB, no rales, rhonchi, & wheezing  Cardiac: RRR, Nl S1, S2, no Murmurs, rubs or gallops  Vascular: Vessel Right Left  Radial Faintly Palpable Faintly Palpable  Ulnar Not Palpable Not Palpable  Brachial Palpable Palpable  Carotid Palpable, without bruit Palpable, without bruit  Aorta Not palpable N/A  Femoral Faintly Palpable Palpable  Popliteal Not palpable Not palpable    PT Not Palpable Not Palpable  DP Not Palpable Not Palpable   Gastrointestinal: soft, NTND, -G/R, - HSM, - masses, - CVAT B  Musculoskeletal: M/S 5/5 throughout , B stasis dermatitis, cyanotic feet, Multiple ulcers in R foot, L 4th distal phalange ischemic  Neurologic: CN 2-12 intact , Pain and light touch intact in extremities including both feet, Motor exam as listed above  Psychiatric: Judgment intact, Mood & affect appropriate for pt's clinical situation  Dermatologic: See M/S exam for extremity exam, no rashes otherwise noted  Lymph : No Cervical, Axillary, or Inguinal lymphadenopathy    Non-Invasive Vascular Imaging  Outside ABI (Date: 10/15/15)  R: 0.18, DP: not recorded, PT: damp mono, TBI: 0  L: 0.58, DP: not recorded, PT: damp mono, TBI: 0.14   Outside Studies/Documentation 5 pages of outside documents were reviewed including: outpatient ABI and wound center notes.   Medical Decision Making  Vidur M [name] is a 82 y.o. male who presents with: BLE critical limb ischemia with right foot ischemic ulcers and left foot ischemic digit   I discussed with the patient the natural history of critical limb ischemia: 25% require amputation in one year, 50% are able to maintain their limbs in one year, and 25-30% die in one year due to comorbidities.  Given the limb threatening status of this patient, I recommend an aggressive work up including proceeding with an: Aortogram, Bilateral runoff and possible intervention. I discussed with the patient the nature of angiographic procedures, especially the limited patencies of any endovascular   intervention. The patient is aware of that the risks of an angiographic procedure include but are not limited to: bleeding, infection, access site complications, embolization, rupture of treated vessel, dissection, possible need for emergent surgical intervention, and possible need for surgical procedures to treat the patient's pathology. The patient  is aware of the risks and agrees to proceed.  The procedure is scheduled for: 10/23/15 with Dr. Fields.  I discussed in depth with the patient the nature of atherosclerosis, and emphasized the importance of maximal medical management including strict control of blood pressure, blood glucose, and lipid levels, antiplatelet agents, obtaining regular exercise, and cessation of smoking.  The patient is aware that without maximal medical management the underlying atherosclerotic disease process will progress, limiting the benefit of any interventions. The patient is currently on a statin:  Reportedly not indicated. The patient is currently on an anti-platelet: ASA.  Thank you for allowing us to participate in this patient's care.   Sawsan Riggio, MD [facility] Office: [phone] Pager: [phone]  10/21/2015, 9:00 AM     

## 2015-10-23 NOTE — Progress Notes (Signed)
Report given to Jodette RN 

## 2015-10-23 NOTE — Op Note (Addendum)
Procedure: Abdominal aortogram with bilateral lower extremity runoff, proglide closure left common femoral Preoperative diagnosis: Nonhealing wound right foot  Postoperative diagnosis: Same  Anesthesia: Local with IV sedation. Sedation time began with monitoring at 7:41 AM and concluded at 8:36 AM. I was present for the entire procedure and monitoring.   Operative findings: #1 bilateral popliteal artery occlusions with unreconstructable tibial disease bilaterally #2 patent aortoiliac femoral system  Operative details: After obtaining informed consent, the patient was taken to the PV lab. The patient was placed in supine position with Angio table. Both groins were prepped and draped in usual sterile fashion. Ultrasound was used to identify the left common femoral artery. Access was made slightly more difficult by the fact that the patient could not lie completely flat he puncture was performed with the patient in approximately a 30 upright angle. This was also made difficult by the fact that the patient had a large pannus. The left common femoral artery was punctured and an 035 versacore wire threaded up into the abdominal aorta without difficulty. A 5 French sheath was then placed over the guidewire and the left common femoral artery. A 5 French pig catheter was then placed over the guidewire up in the abdominal aorta. Abdominal aortogram was then obtained with some slight craniocaudal angulation to compensate for the patient being in a slightly elevated position. Left and right renal arteries are patent. The infrarenal abdominal aorta is patent. The left right common external and internal iliac arteries are patent.  There was poor opacification of the distal portion of the iliac arteries on the first shots of the pigtail catheter was pulled down just above the aortic bifuation and this was repeated in a slightly oblique view also to compensate for a right hip prosthesis. This again confirmed the above  findings that the aortoiliac system is widely patent bilaterally. Next bilateral lower extremity views were performed through the pigtail catheter.  In the right lower extremity, the right common femoral superficial femoral  And profunda femoris are all widely patent. The right popliteal artery occludes just above the knee joint. The anterior tibial and posterior tibial arteries are completely obliterated.  The peroneal artery does reconstitute distally but it is quite small and diffusely diseased giving off only one communicating branch and there is minimal flow into the foot via collaterals.  In the left lower extremity, the left common femoral superficial femoral and profunda femoris arteries are all widely patent. The left popliteal artery occludes at the knee joint. The anterior tibial and posterior tibial arteries are completely obliterated. There is again a small peroneal artery which is diffusely diseased which reconstitutes in the distal leg giving off minimal collaterals.  At this point on review of the previous films at the level of the groin it appeared that the groin stick was just slipping at the top of the femoral head. I decided the safest way to control this would be to close the left groin with closure device.  The 5 French sheath was removed over a guidewire. A 6 inch dilator was placed over the guidewire to dilate the tract. A Proglide device was then brought up over the wire and deployed in the usual fashion. Pulled the delivery system back with the wire in place and noted that there was fairly good hemostasis after cinching down the knot. The guidewire was then completely removed and the knot since down again and essentially hemostasis was obtained. Direct pressure was then held for a few minutes and complete  hemostasis  Obtained. The patient tolerated the procedure well and there were no complications. The patient was taken to the holding area in stable condition.  Operative  management: The patient has unreconstructable tibial artery occlusive disease bilaterally with bilateral popliteal occlusions and essentially no reconstitution of bypassable or percutaneous recanalizing options.  The patient will be referred back to Dr. Meyer RusselBritto with the wound center for continued current wound management. If he fails conservative management of this wound, he would need an amputation of his leg. The patient can follow up with Dr. Imogene Burnhen in the future if he needs amputation of his leg  [name] Brunsharles Rhylan Gross, MD [facility] [place] Office: [phone] Pager: [phone]

## 2015-10-23 NOTE — Discharge Instructions (Signed)
Angiogram, Care After °Refer to this sheet in the next few weeks. These instructions provide you with information about caring for yourself after your procedure. Your health care provider may also give you more specific instructions. Your treatment has been planned according to current medical practices, but problems sometimes occur. Call your health care provider if you have any problems or questions after your procedure. °WHAT TO EXPECT AFTER THE PROCEDURE °After your procedure, it is typical to have the following: °· Bruising at the catheter insertion site that usually fades within 1-2 weeks. °· Blood collecting in the tissue (hematoma) that may be painful to the touch. It should usually decrease in size and tenderness within 1-2 weeks. °HOME CARE INSTRUCTIONS °· Take medicines only as directed by your health care provider. °· You may shower 24-48 hours after the procedure or as directed by your health care provider. Remove the bandage (dressing) and gently wash the site with plain soap and water. Pat the area dry with a clean towel. Do not rub the site, because this may cause bleeding. °· Do not take baths, swim, or use a hot tub until your health care provider approves. °· Check your insertion site every day for redness, swelling, or drainage. °· Do not apply powder or lotion to the site. °· Do not lift over 10 lb (4.5 kg) for 5 days after your procedure or as directed by your health care provider. °· Ask your health care provider when it is okay to: °¨ Return to work or school. °¨ Resume usual physical activities or sports. °¨ Resume sexual activity. °· Do not drive home if you are discharged the same day as the procedure. Have someone else drive you. °· You may drive 24 hours after the procedure unless otherwise instructed by your health care provider. °· Do not operate machinery or power tools for 24 hours after the procedure or as directed by your health care provider. °· If your procedure was done as an  outpatient procedure, which means that you went home the same day as your procedure, a responsible adult should be with you for the first 24 hours after you arrive home. °· Keep all follow-up visits as directed by your health care provider. This is important. °SEEK MEDICAL CARE IF: °· You have a fever. °· You have chills. °· You have increased bleeding from the catheter insertion site. Hold pressure on the site. °SEEK IMMEDIATE MEDICAL CARE IF: °· You have unusual pain at the catheter insertion site. °· You have redness, warmth, or swelling at the catheter insertion site. °· You have drainage (other than a small amount of blood on the dressing) from the catheter insertion site. °· The catheter insertion site is bleeding, and the bleeding does not stop after 30 minutes of holding steady pressure on the site. °· The area near or just beyond the catheter insertion site becomes pale, cool, tingly, or numb. °  °This information is not intended to replace advice given to you by your health care provider. Make sure you discuss any questions you have with your health care provider. °  °Document Released: 12/30/2004 Document Revised: 07/04/2014 Document Reviewed: 11/14/2012 °Elsevier Interactive Patient Education ©2016 [employer] ° °

## 2015-10-26 ENCOUNTER — Encounter: Payer: Medicare Other | Admitting: Surgery

## 2015-10-28 ENCOUNTER — Ambulatory Visit: Payer: Medicare Other | Admitting: Nurse Practitioner

## 2015-10-29 ENCOUNTER — Telehealth: Payer: Self-pay

## 2015-10-29 DIAGNOSIS — L97512 Non-pressure chronic ulcer of other part of right foot with fat layer exposed: Secondary | ICD-10-CM | POA: Diagnosis not present

## 2015-10-29 DIAGNOSIS — I70235 Atherosclerosis of native arteries of right leg with ulceration of other part of foot: Secondary | ICD-10-CM | POA: Diagnosis not present

## 2015-10-29 DIAGNOSIS — I739 Peripheral vascular disease, unspecified: Secondary | ICD-10-CM | POA: Diagnosis not present

## 2015-10-29 DIAGNOSIS — L97513 Non-pressure chronic ulcer of other part of right foot with necrosis of muscle: Secondary | ICD-10-CM | POA: Diagnosis not present

## 2015-10-29 DIAGNOSIS — L97519 Non-pressure chronic ulcer of other part of right foot with unspecified severity: Secondary | ICD-10-CM | POA: Diagnosis not present

## 2015-10-29 NOTE — Telephone Encounter (Signed)
-----   Message from Sharee PimpleMarilyn K McChesney, RN sent at 10/23/2015  1:22 PM EDT ----- Regarding: Lorain ChildesFYI   ----- Message -----    From: Sherren Kernsharles E Fields, MD    Sent: 10/23/2015   8:44 AM      To: [facility] Charge Pool  US groin Proglide closure Aortogram with bilateral runoff  Pt is unreconstructable. He will get follow up wound care with Dr Meyer RusselBritto  He can follow up with Dr Imogene Burnhen who saw the pt initially if he needs amputation in the future  Fabienne Brunsharles Fields, MD [facility] [place] Office: [phone] Pager: [phone]

## 2015-10-31 DIAGNOSIS — Z7982 Long term (current) use of aspirin: Secondary | ICD-10-CM | POA: Diagnosis not present

## 2015-10-31 DIAGNOSIS — I110 Hypertensive heart disease with heart failure: Secondary | ICD-10-CM | POA: Diagnosis not present

## 2015-10-31 DIAGNOSIS — L97512 Non-pressure chronic ulcer of other part of right foot with fat layer exposed: Secondary | ICD-10-CM | POA: Diagnosis not present

## 2015-10-31 DIAGNOSIS — J449 Chronic obstructive pulmonary disease, unspecified: Secondary | ICD-10-CM | POA: Diagnosis not present

## 2015-10-31 DIAGNOSIS — I872 Venous insufficiency (chronic) (peripheral): Secondary | ICD-10-CM | POA: Diagnosis not present

## 2015-10-31 DIAGNOSIS — I509 Heart failure, unspecified: Secondary | ICD-10-CM | POA: Diagnosis not present

## 2015-10-31 DIAGNOSIS — I4891 Unspecified atrial fibrillation: Secondary | ICD-10-CM | POA: Diagnosis not present

## 2015-11-03 DIAGNOSIS — I872 Venous insufficiency (chronic) (peripheral): Secondary | ICD-10-CM | POA: Diagnosis not present

## 2015-11-03 DIAGNOSIS — J449 Chronic obstructive pulmonary disease, unspecified: Secondary | ICD-10-CM | POA: Diagnosis not present

## 2015-11-03 DIAGNOSIS — I509 Heart failure, unspecified: Secondary | ICD-10-CM | POA: Diagnosis not present

## 2015-11-03 DIAGNOSIS — L97512 Non-pressure chronic ulcer of other part of right foot with fat layer exposed: Secondary | ICD-10-CM | POA: Diagnosis not present

## 2015-11-03 DIAGNOSIS — I110 Hypertensive heart disease with heart failure: Secondary | ICD-10-CM | POA: Diagnosis not present

## 2015-11-03 DIAGNOSIS — I4891 Unspecified atrial fibrillation: Secondary | ICD-10-CM | POA: Diagnosis not present

## 2015-11-06 DIAGNOSIS — I110 Hypertensive heart disease with heart failure: Secondary | ICD-10-CM | POA: Diagnosis not present

## 2015-11-06 DIAGNOSIS — J449 Chronic obstructive pulmonary disease, unspecified: Secondary | ICD-10-CM | POA: Diagnosis not present

## 2015-11-06 DIAGNOSIS — I509 Heart failure, unspecified: Secondary | ICD-10-CM | POA: Diagnosis not present

## 2015-11-06 DIAGNOSIS — I4891 Unspecified atrial fibrillation: Secondary | ICD-10-CM | POA: Diagnosis not present

## 2015-11-06 DIAGNOSIS — I872 Venous insufficiency (chronic) (peripheral): Secondary | ICD-10-CM | POA: Diagnosis not present

## 2015-11-06 DIAGNOSIS — L97512 Non-pressure chronic ulcer of other part of right foot with fat layer exposed: Secondary | ICD-10-CM | POA: Diagnosis not present

## 2015-11-09 DIAGNOSIS — I509 Heart failure, unspecified: Secondary | ICD-10-CM | POA: Diagnosis not present

## 2015-11-09 DIAGNOSIS — I4891 Unspecified atrial fibrillation: Secondary | ICD-10-CM | POA: Diagnosis not present

## 2015-11-09 DIAGNOSIS — I872 Venous insufficiency (chronic) (peripheral): Secondary | ICD-10-CM | POA: Diagnosis not present

## 2015-11-09 DIAGNOSIS — J449 Chronic obstructive pulmonary disease, unspecified: Secondary | ICD-10-CM | POA: Diagnosis not present

## 2015-11-09 DIAGNOSIS — I110 Hypertensive heart disease with heart failure: Secondary | ICD-10-CM | POA: Diagnosis not present

## 2015-11-09 DIAGNOSIS — L97512 Non-pressure chronic ulcer of other part of right foot with fat layer exposed: Secondary | ICD-10-CM | POA: Diagnosis not present

## 2015-11-12 DIAGNOSIS — I509 Heart failure, unspecified: Secondary | ICD-10-CM | POA: Diagnosis not present

## 2015-11-12 DIAGNOSIS — L97512 Non-pressure chronic ulcer of other part of right foot with fat layer exposed: Secondary | ICD-10-CM | POA: Diagnosis not present

## 2015-11-12 DIAGNOSIS — I872 Venous insufficiency (chronic) (peripheral): Secondary | ICD-10-CM | POA: Diagnosis not present

## 2015-11-12 DIAGNOSIS — I4891 Unspecified atrial fibrillation: Secondary | ICD-10-CM | POA: Diagnosis not present

## 2015-11-12 DIAGNOSIS — J449 Chronic obstructive pulmonary disease, unspecified: Secondary | ICD-10-CM | POA: Diagnosis not present

## 2015-11-12 DIAGNOSIS — I110 Hypertensive heart disease with heart failure: Secondary | ICD-10-CM | POA: Diagnosis not present

## 2015-11-13 ENCOUNTER — Ambulatory Visit (INDEPENDENT_AMBULATORY_CARE_PROVIDER_SITE_OTHER): Payer: Medicare Other | Admitting: Internal Medicine

## 2015-11-13 ENCOUNTER — Encounter: Payer: Self-pay | Admitting: Internal Medicine

## 2015-11-13 ENCOUNTER — Telehealth: Payer: Self-pay

## 2015-11-13 VITALS — BP 140/86 | HR 63 | Temp 98.0°F | Ht 71.0 in | Wt 197.0 lb

## 2015-11-13 DIAGNOSIS — I70223 Atherosclerosis of native arteries of extremities with rest pain, bilateral legs: Secondary | ICD-10-CM

## 2015-11-13 DIAGNOSIS — K5903 Drug induced constipation: Secondary | ICD-10-CM | POA: Diagnosis not present

## 2015-11-13 DIAGNOSIS — K220 Achalasia of cardia: Secondary | ICD-10-CM | POA: Diagnosis not present

## 2015-11-13 DIAGNOSIS — K219 Gastro-esophageal reflux disease without esophagitis: Secondary | ICD-10-CM | POA: Diagnosis not present

## 2015-11-13 DIAGNOSIS — T402X5A Adverse effect of other opioids, initial encounter: Secondary | ICD-10-CM | POA: Diagnosis not present

## 2015-11-13 NOTE — Progress Notes (Signed)
Primary Care Physician:  Laural Golden Primary Gastroenterologist:  Dr. Jena Gauss Pre-Procedure History & Physical: HPI:  [name] [name] is a 82 y.o. male here for follow-up of dysphagia dysphagia and GERD. Recent EGD demonstrated a dilated, baggy esophagus. No obstructing lesion seen. Findings suspicious for achalasia. Passage of Maloney dilators up to 33 [place] has not improved his symptoms. He had a manometry [facility] at [facility] which was suspicious for type 2/type III achalasia. Constipation has been managed nicely on Movantik 25 mg daily. Not had any rectal bleeding. He continues to have  reflux symptoms.   Patient states Carafate has helped significantly on top of Protonix 40 mg daily  Past Medical History  Diagnosis Date  . Hypertension   . Alcohol abuse     stop drinking since 01/2009  . B12 deficiency 9/10    started injection   . COPD (chronic obstructive pulmonary disease) (HCC)   . Compression fracture   . Venous stasis     chronic   . BPH (benign prostatic hyperplasia)   . Nutcracker esophagus 2002    on EM  . Diverticula of colon     few small scattered in the sigmoid colon  . GERD (gastroesophageal reflux disease)   . H/O hiatal hernia   . Headache(784.0)   . Cancer (HCC) [facility]    BLADDER CANCER CELLS TX 2 MO AGO  . Chronic kidney disease     RECENT UTI FEW MONTHS AGO TX WITH ANTIBIOTIC  . Atrial fibrillation (HCC)     off coumadin secondary to fall and subdural  hematoma   . External hemorrhoids 09/2014  . Arthritis   . Reflux   . Asthma   . Osteoporosis   . Enlarged prostate   . Peripheral edema   . Carcinoma in situ of bladder     Past Surgical History  Procedure Laterality Date  . Hernia repair  12 YRS AGO    RIGHT SIDE   . Rhinoplasty  1974  . Lung biopsy  10 YRS AGO    BENIGN  . Joint replacement      RT HIP X2  (1 REPLACEMENT)  . Bladder aspiration      PROC FOR CANCER CELLS   . Cystoscopy  10/18/2011    Procedure: CYSTOSCOPY;  Surgeon:  Anner Crete, MD;  Location: [facility] ORS;  Service: Urology;  Laterality: N/A;  . Cataract extraction w/phaco  07/30/2012    Procedure: CATARACT EXTRACTION PHACO AND INTRAOCULAR LENS PLACEMENT (IOC);  Surgeon: Susa Simmonds, MD;  Location: [facility] ORS;  Service: Ophthalmology;  Laterality: Left;  CDE:31.66  . Cataract extraction w/phaco Right 11/12/2012    Procedure: CATARACT EXTRACTION PHACO AND INTRAOCULAR LENS PLACEMENT (IOC);  Surgeon: Susa Simmonds, MD;  Location: [facility] ORS;  Service: Ophthalmology;  Laterality: Right;  CDE 18.25  . Esophagogastroduodenoscopy  02/11/2010    RMR: GE narrowing s/p dilation 56 & 58 Fr maloney otherwise normal  . Esophagogastroduodenoscopy N/A 10/01/2015    Dr.Sabri Teal- mildly severe esophagitis, likely pill-induced, narrowed GE junction without fixed lesion found ?query achalasia, s/p maloney dilation, nomal stomach, normal second portion of the duodenum  . Maloney dilation N/A 10/01/2015    Procedure: [name] DILATION;  Surgeon: Corbin Ade, MD;  Location: [facility] ENDO SUITE;  Service: Endoscopy;  Laterality: N/A;  . Peripheral vascular catheterization N/A 10/23/2015    Procedure: Abdominal Aortogram w/Lower Extremity;  Surgeon: Sherren Kerns, MD;  Location[facility] INVASIVE CV LAB;  Service: Cardiovascular;  Laterality: N/A;    Prior to Admission medications   Medication Sig Start Date End Date Taking? Authorizing Provider  alendronate (FOSAMAX) 70 MG tablet Take 1 tablet (70 mg total) by mouth every 7 (seven) days. Take with a full glass of water on an empty stomach. 04/22/13  Yes Merlyn AlbertWilliam S Luking, MD  aspirin 325 MG tablet Take 325 mg by mouth daily.   Yes Historical Provider, MD  Doxylamine Succinate, Sleep, (SLEEP AID PO) Take 1-2 tablets by mouth at bedtime as needed (sleep).   Yes Historical Provider, MD  escitalopram (LEXAPRO) 10 MG tablet Take 1 tablet (10 mg total) by mouth daily. 08/21/15 08/20/16 Yes Merlyn AlbertWilliam S Luking, MD  furosemide (LASIX) 40 MG tablet Take 2 tablets  every morning 02/11/14  Yes Merlyn AlbertWilliam S Luking, MD  naproxen sodium (ANAPROX) 220 MG tablet Take 220 mg by mouth 2 (two) times daily with a meal.   Yes Historical Provider, MD  Niacin (VITAMIN B-3 PO) Take by mouth.   Yes Historical Provider, MD  Omega-3 Fatty Acids (FISH OIL PO) Take by mouth.   Yes Historical Provider, MD  OVER THE COUNTER MEDICATION Calcium   Yes Historical Provider, MD  OVER THE COUNTER MEDICATION Take 1 tablet by mouth daily. Vitamin D   Yes Historical Provider, MD  oxyCODONE-acetaminophen (ROXICET) 5-325 MG tablet Take 1 tablet by mouth every 8 (eight) hours as needed for severe pain. 07/14/15  Yes Merlyn AlbertWilliam S Luking, MD  pantoprazole (PROTONIX) 40 MG tablet Take 1 tablet (40 mg total) by mouth daily. Patient taking differently: Take 40 mg by mouth daily as needed (reflux).  09/10/15  Yes Anice PaganiniEric A Gill, NP  potassium chloride SA (KLOR-CON M20) 20 MEQ tablet Take 1 tablet (20 mEq total) by mouth daily. 02/11/14  Yes Merlyn AlbertWilliam S Luking, MD  verapamil (CALAN-SR) 240 MG CR tablet Take 1 tablet (240 mg total) by mouth daily. 04/07/15  Yes Merlyn AlbertWilliam S Luking, MD  amoxicillin-clavulanate (AUGMENTIN) 875-125 MG tablet Take 1 tablet by mouth 2 (two) times daily. Patient not taking: Reported on 11/13/2015 10/16/15   Babs SciaraScott A Luking, MD  naloxegol oxalate (MOVANTIK) 25 MG TABS tablet Take 1 tablet (25 mg total) by mouth daily. Patient not taking: Reported on 11/13/2015 10/08/15   Nira RetortAnna W Sams, NP    Allergies as of 11/13/2015 - Review Complete 11/13/2015  Allergen Reaction Noted  . Oxycodone-acetaminophen Other (See Comments)   . Tramadol Itching   . Sulfonamide derivatives Hives     Family History  Problem Relation Age of Onset  . Cancer Brother   . Hypertension Mother   . Diabetes Mother   . Heart attack Mother   . Heart disease Mother     Social History   Social History  . Marital Status: Married    Spouse Name: N/A  . Number of Children: 2  . Years of Education: N/A    Occupational History  . Not on file.   Social History Main Topics  . Smoking status: Never Smoker   . Smokeless tobacco: Former NeurosurgeonUser    Types: Chew    Quit date: 06/28/1991  . Alcohol Use: No     Comment: history of alcohol abuse stop drinking 8/10 . Alcohol abuse for about 25 years   . Drug Use: No  . Sexual Activity: Not on file   Other Topics Concern  . Not on file   Social History Narrative    Review of Systems: See HPI, otherwise negative ROS  Physical Exam:  BP 140/86 mmHg  Pulse 63  Temp(Src) 98 F (36.7 C) (Oral)  Ht 5\' 11"  (1.803 m)  Wt 197 lb (89.359 kg)  BMI 27.49 kg/m2 General:  Elderly,pleasant and cooperative in NAD Neck:  Supple; no masses or thyromegaly. No significant cervical adenopathy. Lungs:  Clear throughout to auscultation.   No wheezes, crackles, or rhonchi. No acute distress. Heart:  Regular rate and rhythm; no murmurs, clicks, rubs,  or gallops. Abdomen: Non-distended, normal bowel sounds.  Soft and nontender without appreciable mass or hepatosplenomegaly.  Pulses:  Normal pulses noted. Extremities:  Without clubbing or edema.  Impression:  Pleasant 82 year old gentleman dysphagia along with a dilated esophagus with no obstructing lesion found at endoscopy. Manometry consistent with some type of achalasia. This fits clinically. He is having significant "pseudo-reflux"  likely from fermenting debris in his esophagus.  I discussed anticoagulation the various treatment options. He not a good candidate for surgical myotomy. Perhaps Botox or an endoscopic myotomy could be pursued. Constipation likely opioid induced constipation managed nicely with no bands.  Recommendations:   We will schedule a consultation at [facility] for management of achalasia (specifically, we'll get him to see a faculty members with particular expertise in esophageal disease).  He is to continue Carafate and Protonix  Continue Moavntik daily for constipation    Office  visit in 3 months    Notice: This dictation was prepared with Dragon dictation along with smaller phrase technology. Any transcriptional errors that result from this process are unintentional and may not be corrected upon review.

## 2015-11-13 NOTE — Patient Instructions (Signed)
[facility] of [facility]  Continue Carafate and Protonix  Continue Moavntik daily for constipation  Office visit in 3 months

## 2015-11-13 NOTE — Telephone Encounter (Signed)
Faxed clinicals and OV notes to request appt. Follow up with Dr. Jacinto Reaphorne per Dr. Jena Gaussourk request.  States Dr. Lorri Frederickhrone next available is August. Sent message to provider to see if pt could be seen quicker.

## 2015-11-16 ENCOUNTER — Telehealth: Payer: Self-pay

## 2015-11-16 DIAGNOSIS — I4891 Unspecified atrial fibrillation: Secondary | ICD-10-CM | POA: Diagnosis not present

## 2015-11-16 DIAGNOSIS — I872 Venous insufficiency (chronic) (peripheral): Secondary | ICD-10-CM | POA: Diagnosis not present

## 2015-11-16 DIAGNOSIS — I110 Hypertensive heart disease with heart failure: Secondary | ICD-10-CM | POA: Diagnosis not present

## 2015-11-16 DIAGNOSIS — J449 Chronic obstructive pulmonary disease, unspecified: Secondary | ICD-10-CM | POA: Diagnosis not present

## 2015-11-16 DIAGNOSIS — L97512 Non-pressure chronic ulcer of other part of right foot with fat layer exposed: Secondary | ICD-10-CM | POA: Diagnosis not present

## 2015-11-16 DIAGNOSIS — I509 Heart failure, unspecified: Secondary | ICD-10-CM | POA: Diagnosis not present

## 2015-11-16 NOTE — Telephone Encounter (Signed)
Thanks for letting us know, challengin because diizz yo off and on for yrs, that rate sounds lower than norma tho, rec ekg and o v later this wk

## 2015-11-16 NOTE — Telephone Encounter (Signed)
Notified home health nurse Lanora Manis(Elizabeth) thanks for letting us know, challenging because dizziness has been off and on for years, that rate sounds lower than normal though, recommend ekg and office visit later this week. Lanora Manislizabeth verbalized understanding and she will call patient and notify him to schedule appointment.

## 2015-11-16 NOTE — Telephone Encounter (Signed)
Pts [facility] nurse from [facility] Lanora Manis(Elizabeth) Calling to let you know the pts HR is resting at 40, he was  Asymptomatic while she was there but did state at times he  Has SOB and dizzy spells. She feels this may be due to his  Use of narcotics but wanted you to know what was going on.  Pt told her this was a normal HR for him and you were aware of  It.  She just wanted you to have a FYI, feel free to call her with any Questions of concerns [phone]

## 2015-11-18 ENCOUNTER — Ambulatory Visit (INDEPENDENT_AMBULATORY_CARE_PROVIDER_SITE_OTHER): Payer: Medicare Other | Admitting: Family Medicine

## 2015-11-18 ENCOUNTER — Encounter: Payer: Self-pay | Admitting: Family Medicine

## 2015-11-18 ENCOUNTER — Other Ambulatory Visit: Payer: Self-pay

## 2015-11-18 VITALS — BP 116/58 | HR 50 | Ht 71.0 in | Wt 197.4 lb

## 2015-11-18 DIAGNOSIS — I70223 Atherosclerosis of native arteries of extremities with rest pain, bilateral legs: Secondary | ICD-10-CM | POA: Diagnosis not present

## 2015-11-18 DIAGNOSIS — R001 Bradycardia, unspecified: Secondary | ICD-10-CM | POA: Diagnosis not present

## 2015-11-18 DIAGNOSIS — I4891 Unspecified atrial fibrillation: Secondary | ICD-10-CM | POA: Diagnosis not present

## 2015-11-18 DIAGNOSIS — K220 Achalasia of cardia: Secondary | ICD-10-CM

## 2015-11-18 MED ORDER — VERAPAMIL HCL ER 180 MG PO TBCR: EXTENDED_RELEASE_TABLET | ORAL | Status: DC

## 2015-11-18 NOTE — Progress Notes (Signed)
   Subjective:    Patient ID: [name] [name], male    DOB: 12/17/1932, 82 y.o.   MRN: [id number]  HPI Patient in today for c/o of Bradycardia.  States that heart rate has been low. Patient has concerns of circulation.    patient has been working with the therapist in terms of control of his chronic wound ulceration. They have expressed concern about his low pulse. Claims compliance with Cardizem. No obvious side effects. No lightheadedness no chest pain. He does note slight dizziness with exertion next  No noticeable orthostatic changes States no other concerns this visit. Review of Systems  no chest pain no shortness breath no abdominal pain    Objective:   Physical Exam   alert vital stable blood pressure 114/78 on repeat pulses low around 50 HEENT normal no acute distress lungs clear heart slight flow murmur X  EKG atrophic with controlled rate at 50 bpm      Assessment & Plan:   impression atrophic with overly tight controlled rate plan decrease Cardizem from 240-180. That should help bump up the heart rate along with PRODUCTIVE BLOOD PRESSURE. Warning signs discussed WSL do not feel need for cardiac further workup at this time

## 2015-11-19 ENCOUNTER — Telehealth: Payer: Self-pay | Admitting: Internal Medicine

## 2015-11-19 ENCOUNTER — Encounter: Payer: Self-pay | Admitting: Family Medicine

## 2015-11-19 NOTE — Telephone Encounter (Signed)
Pt called this morning asking if anymore samples have come in. He does not know the name of them. He was seen on 5/19 and was given one box, but RMR said that we would order more for him and to check early in the week if we've received anymore. [phone]

## 2015-11-19 NOTE — Telephone Encounter (Signed)
PLEASE PUT MOVANTIC SAMPLES ASIDE FOR THE PATIENT WHEN THEY COME IN

## 2015-11-20 DIAGNOSIS — I509 Heart failure, unspecified: Secondary | ICD-10-CM | POA: Diagnosis not present

## 2015-11-20 DIAGNOSIS — J449 Chronic obstructive pulmonary disease, unspecified: Secondary | ICD-10-CM | POA: Diagnosis not present

## 2015-11-20 DIAGNOSIS — I4891 Unspecified atrial fibrillation: Secondary | ICD-10-CM | POA: Diagnosis not present

## 2015-11-20 DIAGNOSIS — I872 Venous insufficiency (chronic) (peripheral): Secondary | ICD-10-CM | POA: Diagnosis not present

## 2015-11-20 DIAGNOSIS — I110 Hypertensive heart disease with heart failure: Secondary | ICD-10-CM | POA: Diagnosis not present

## 2015-11-20 DIAGNOSIS — L97512 Non-pressure chronic ulcer of other part of right foot with fat layer exposed: Secondary | ICD-10-CM | POA: Diagnosis not present

## 2015-11-20 NOTE — Telephone Encounter (Signed)
We do not have any samples at this time.  

## 2015-11-20 NOTE — Telephone Encounter (Signed)
Noted. We have not received any samples yet.

## 2015-11-24 DIAGNOSIS — I4891 Unspecified atrial fibrillation: Secondary | ICD-10-CM | POA: Diagnosis not present

## 2015-11-24 DIAGNOSIS — L97512 Non-pressure chronic ulcer of other part of right foot with fat layer exposed: Secondary | ICD-10-CM | POA: Diagnosis not present

## 2015-11-24 DIAGNOSIS — I872 Venous insufficiency (chronic) (peripheral): Secondary | ICD-10-CM | POA: Diagnosis not present

## 2015-11-24 DIAGNOSIS — I509 Heart failure, unspecified: Secondary | ICD-10-CM | POA: Diagnosis not present

## 2015-11-24 DIAGNOSIS — J449 Chronic obstructive pulmonary disease, unspecified: Secondary | ICD-10-CM | POA: Diagnosis not present

## 2015-11-24 DIAGNOSIS — I110 Hypertensive heart disease with heart failure: Secondary | ICD-10-CM | POA: Diagnosis not present

## 2015-11-26 DIAGNOSIS — L97512 Non-pressure chronic ulcer of other part of right foot with fat layer exposed: Secondary | ICD-10-CM | POA: Diagnosis not present

## 2015-11-26 DIAGNOSIS — I70235 Atherosclerosis of native arteries of right leg with ulceration of other part of foot: Secondary | ICD-10-CM | POA: Diagnosis not present

## 2015-11-26 DIAGNOSIS — I999 Unspecified disorder of circulatory system: Secondary | ICD-10-CM | POA: Diagnosis not present

## 2015-11-26 DIAGNOSIS — L97519 Non-pressure chronic ulcer of other part of right foot with unspecified severity: Secondary | ICD-10-CM | POA: Diagnosis not present

## 2015-11-26 DIAGNOSIS — L97513 Non-pressure chronic ulcer of other part of right foot with necrosis of muscle: Secondary | ICD-10-CM | POA: Diagnosis not present

## 2015-11-26 NOTE — Telephone Encounter (Signed)
Per drug rep, samples are on back order and she is not sure when we will get any.

## 2015-11-26 NOTE — Telephone Encounter (Signed)
rx for movantik was sent to the pharamcy in April.

## 2015-11-27 ENCOUNTER — Ambulatory Visit (INDEPENDENT_AMBULATORY_CARE_PROVIDER_SITE_OTHER): Payer: Medicare Other | Admitting: Urology

## 2015-11-27 ENCOUNTER — Other Ambulatory Visit (HOSPITAL_COMMUNITY)
Admission: RE | Admit: 2015-11-27 | Discharge: 2015-11-27 | Disposition: A | Discharge status: Home / Self Care | Payer: Medicare Other | Source: Other Acute Inpatient Hospital | Attending: Urology | Admitting: Urology

## 2015-11-27 DIAGNOSIS — L97512 Non-pressure chronic ulcer of other part of right foot with fat layer exposed: Secondary | ICD-10-CM | POA: Diagnosis not present

## 2015-11-27 DIAGNOSIS — I4891 Unspecified atrial fibrillation: Secondary | ICD-10-CM | POA: Diagnosis not present

## 2015-11-27 DIAGNOSIS — N3941 Urge incontinence: Secondary | ICD-10-CM | POA: Diagnosis not present

## 2015-11-27 DIAGNOSIS — I509 Heart failure, unspecified: Secondary | ICD-10-CM | POA: Diagnosis not present

## 2015-11-27 DIAGNOSIS — N3020 Other chronic cystitis without hematuria: Secondary | ICD-10-CM | POA: Diagnosis not present

## 2015-11-27 DIAGNOSIS — I872 Venous insufficiency (chronic) (peripheral): Secondary | ICD-10-CM | POA: Diagnosis not present

## 2015-11-27 DIAGNOSIS — R3129 Other microscopic hematuria: Secondary | ICD-10-CM | POA: Diagnosis not present

## 2015-11-27 DIAGNOSIS — Z86008 Personal history of in-situ neoplasm of other site: Secondary | ICD-10-CM | POA: Diagnosis not present

## 2015-11-27 DIAGNOSIS — J449 Chronic obstructive pulmonary disease, unspecified: Secondary | ICD-10-CM | POA: Diagnosis not present

## 2015-11-27 DIAGNOSIS — I110 Hypertensive heart disease with heart failure: Secondary | ICD-10-CM | POA: Diagnosis not present

## 2015-11-27 LAB — URINALYSIS, ROUTINE W REFLEX MICROSCOPIC
GLUCOSE, UA: NEGATIVE mg/dL
HGB URINE DIPSTICK: NEGATIVE
Leukocytes, UA: NEGATIVE
Nitrite: NEGATIVE
PH: 5.5 (ref 5.0–8.0)

## 2015-11-27 LAB — URINE MICROSCOPIC-ADD ON
Bacteria, UA: NONE SEEN
Squamous Epithelial / LPF: NONE SEEN
WBC, UA: NONE SEEN WBC/hpf (ref 0–5)

## 2015-12-09 DIAGNOSIS — Z888 Allergy status to other drugs, medicaments and biological substances status: Secondary | ICD-10-CM | POA: Diagnosis not present

## 2015-12-09 DIAGNOSIS — I2510 Atherosclerotic heart disease of native coronary artery without angina pectoris: Secondary | ICD-10-CM | POA: Diagnosis not present

## 2015-12-09 DIAGNOSIS — I509 Heart failure, unspecified: Secondary | ICD-10-CM | POA: Diagnosis not present

## 2015-12-09 DIAGNOSIS — Z7982 Long term (current) use of aspirin: Secondary | ICD-10-CM | POA: Diagnosis not present

## 2015-12-09 DIAGNOSIS — Z882 Allergy status to sulfonamides status: Secondary | ICD-10-CM | POA: Diagnosis not present

## 2015-12-09 DIAGNOSIS — K220 Achalasia of cardia: Secondary | ICD-10-CM | POA: Insufficient documentation

## 2015-12-09 DIAGNOSIS — Z79899 Other long term (current) drug therapy: Secondary | ICD-10-CM | POA: Diagnosis not present

## 2015-12-09 DIAGNOSIS — Z885 Allergy status to narcotic agent status: Secondary | ICD-10-CM | POA: Diagnosis not present

## 2015-12-09 DIAGNOSIS — I110 Hypertensive heart disease with heart failure: Secondary | ICD-10-CM | POA: Diagnosis not present

## 2015-12-10 DIAGNOSIS — M7989 Other specified soft tissue disorders: Secondary | ICD-10-CM | POA: Diagnosis not present

## 2015-12-10 DIAGNOSIS — R001 Bradycardia, unspecified: Secondary | ICD-10-CM | POA: Diagnosis not present

## 2015-12-10 DIAGNOSIS — L97519 Non-pressure chronic ulcer of other part of right foot with unspecified severity: Secondary | ICD-10-CM | POA: Diagnosis not present

## 2015-12-10 DIAGNOSIS — I872 Venous insufficiency (chronic) (peripheral): Secondary | ICD-10-CM | POA: Diagnosis not present

## 2015-12-10 DIAGNOSIS — J449 Chronic obstructive pulmonary disease, unspecified: Secondary | ICD-10-CM | POA: Diagnosis not present

## 2015-12-10 DIAGNOSIS — I110 Hypertensive heart disease with heart failure: Secondary | ICD-10-CM | POA: Diagnosis not present

## 2015-12-10 DIAGNOSIS — R7989 Other specified abnormal findings of blood chemistry: Secondary | ICD-10-CM | POA: Diagnosis not present

## 2015-12-10 DIAGNOSIS — L859 Epidermal thickening, unspecified: Secondary | ICD-10-CM | POA: Diagnosis not present

## 2015-12-10 DIAGNOSIS — I4891 Unspecified atrial fibrillation: Secondary | ICD-10-CM | POA: Diagnosis not present

## 2015-12-10 DIAGNOSIS — I878 Other specified disorders of veins: Secondary | ICD-10-CM | POA: Diagnosis not present

## 2015-12-10 DIAGNOSIS — M79671 Pain in right foot: Secondary | ICD-10-CM | POA: Diagnosis not present

## 2015-12-10 DIAGNOSIS — M85871 Other specified disorders of bone density and structure, right ankle and foot: Secondary | ICD-10-CM | POA: Diagnosis not present

## 2015-12-10 DIAGNOSIS — R9431 Abnormal electrocardiogram [ECG] [EKG]: Secondary | ICD-10-CM | POA: Diagnosis not present

## 2015-12-10 DIAGNOSIS — I4510 Unspecified right bundle-branch block: Secondary | ICD-10-CM | POA: Diagnosis not present

## 2015-12-10 DIAGNOSIS — I509 Heart failure, unspecified: Secondary | ICD-10-CM | POA: Diagnosis not present

## 2015-12-10 DIAGNOSIS — L97512 Non-pressure chronic ulcer of other part of right foot with fat layer exposed: Secondary | ICD-10-CM | POA: Diagnosis not present

## 2015-12-14 DIAGNOSIS — L97512 Non-pressure chronic ulcer of other part of right foot with fat layer exposed: Secondary | ICD-10-CM | POA: Diagnosis not present

## 2015-12-14 DIAGNOSIS — I509 Heart failure, unspecified: Secondary | ICD-10-CM | POA: Diagnosis not present

## 2015-12-14 DIAGNOSIS — I872 Venous insufficiency (chronic) (peripheral): Secondary | ICD-10-CM | POA: Diagnosis not present

## 2015-12-14 DIAGNOSIS — I4891 Unspecified atrial fibrillation: Secondary | ICD-10-CM | POA: Diagnosis not present

## 2015-12-14 DIAGNOSIS — J449 Chronic obstructive pulmonary disease, unspecified: Secondary | ICD-10-CM | POA: Diagnosis not present

## 2015-12-14 DIAGNOSIS — I110 Hypertensive heart disease with heart failure: Secondary | ICD-10-CM | POA: Diagnosis not present

## 2015-12-17 DIAGNOSIS — J449 Chronic obstructive pulmonary disease, unspecified: Secondary | ICD-10-CM | POA: Diagnosis not present

## 2015-12-17 DIAGNOSIS — I4891 Unspecified atrial fibrillation: Secondary | ICD-10-CM | POA: Diagnosis not present

## 2015-12-17 DIAGNOSIS — I872 Venous insufficiency (chronic) (peripheral): Secondary | ICD-10-CM | POA: Diagnosis not present

## 2015-12-17 DIAGNOSIS — L97512 Non-pressure chronic ulcer of other part of right foot with fat layer exposed: Secondary | ICD-10-CM | POA: Diagnosis not present

## 2015-12-17 DIAGNOSIS — I509 Heart failure, unspecified: Secondary | ICD-10-CM | POA: Diagnosis not present

## 2015-12-17 DIAGNOSIS — I110 Hypertensive heart disease with heart failure: Secondary | ICD-10-CM | POA: Diagnosis not present

## 2015-12-21 DIAGNOSIS — I509 Heart failure, unspecified: Secondary | ICD-10-CM | POA: Diagnosis not present

## 2015-12-21 DIAGNOSIS — I110 Hypertensive heart disease with heart failure: Secondary | ICD-10-CM | POA: Diagnosis not present

## 2015-12-21 DIAGNOSIS — I872 Venous insufficiency (chronic) (peripheral): Secondary | ICD-10-CM | POA: Diagnosis not present

## 2015-12-21 DIAGNOSIS — L97512 Non-pressure chronic ulcer of other part of right foot with fat layer exposed: Secondary | ICD-10-CM | POA: Diagnosis not present

## 2015-12-21 DIAGNOSIS — I4891 Unspecified atrial fibrillation: Secondary | ICD-10-CM | POA: Diagnosis not present

## 2015-12-21 DIAGNOSIS — J449 Chronic obstructive pulmonary disease, unspecified: Secondary | ICD-10-CM | POA: Diagnosis not present

## 2015-12-24 DIAGNOSIS — J449 Chronic obstructive pulmonary disease, unspecified: Secondary | ICD-10-CM | POA: Diagnosis not present

## 2015-12-24 DIAGNOSIS — I872 Venous insufficiency (chronic) (peripheral): Secondary | ICD-10-CM | POA: Diagnosis not present

## 2015-12-24 DIAGNOSIS — L97512 Non-pressure chronic ulcer of other part of right foot with fat layer exposed: Secondary | ICD-10-CM | POA: Diagnosis not present

## 2015-12-24 DIAGNOSIS — I509 Heart failure, unspecified: Secondary | ICD-10-CM | POA: Diagnosis not present

## 2015-12-24 DIAGNOSIS — I110 Hypertensive heart disease with heart failure: Secondary | ICD-10-CM | POA: Diagnosis not present

## 2015-12-24 DIAGNOSIS — I4891 Unspecified atrial fibrillation: Secondary | ICD-10-CM | POA: Diagnosis not present

## 2015-12-25 ENCOUNTER — Ambulatory Visit: Payer: Medicare Other | Admitting: Nurse Practitioner

## 2015-12-25 ENCOUNTER — Ambulatory Visit: Payer: Medicare Other | Admitting: Urology

## 2015-12-28 DIAGNOSIS — I872 Venous insufficiency (chronic) (peripheral): Secondary | ICD-10-CM | POA: Diagnosis not present

## 2015-12-28 DIAGNOSIS — I4891 Unspecified atrial fibrillation: Secondary | ICD-10-CM | POA: Diagnosis not present

## 2015-12-28 DIAGNOSIS — J449 Chronic obstructive pulmonary disease, unspecified: Secondary | ICD-10-CM | POA: Diagnosis not present

## 2015-12-28 DIAGNOSIS — I110 Hypertensive heart disease with heart failure: Secondary | ICD-10-CM | POA: Diagnosis not present

## 2015-12-28 DIAGNOSIS — L97512 Non-pressure chronic ulcer of other part of right foot with fat layer exposed: Secondary | ICD-10-CM | POA: Diagnosis not present

## 2015-12-28 DIAGNOSIS — I509 Heart failure, unspecified: Secondary | ICD-10-CM | POA: Diagnosis not present

## 2015-12-30 ENCOUNTER — Telehealth: Payer: Self-pay | Admitting: Internal Medicine

## 2015-12-30 DIAGNOSIS — I4891 Unspecified atrial fibrillation: Secondary | ICD-10-CM | POA: Diagnosis not present

## 2015-12-30 DIAGNOSIS — J449 Chronic obstructive pulmonary disease, unspecified: Secondary | ICD-10-CM | POA: Diagnosis not present

## 2015-12-30 DIAGNOSIS — I509 Heart failure, unspecified: Secondary | ICD-10-CM | POA: Diagnosis not present

## 2015-12-30 DIAGNOSIS — L97512 Non-pressure chronic ulcer of other part of right foot with fat layer exposed: Secondary | ICD-10-CM | POA: Diagnosis not present

## 2015-12-30 DIAGNOSIS — Z7982 Long term (current) use of aspirin: Secondary | ICD-10-CM | POA: Diagnosis not present

## 2015-12-30 DIAGNOSIS — I110 Hypertensive heart disease with heart failure: Secondary | ICD-10-CM | POA: Diagnosis not present

## 2015-12-30 DIAGNOSIS — I872 Venous insufficiency (chronic) (peripheral): Secondary | ICD-10-CM | POA: Diagnosis not present

## 2015-12-30 NOTE — Telephone Encounter (Signed)
Movantik 25 mg # 12 samples given to take one a day. Pt is aware and will come by to pick them up.

## 2015-12-30 NOTE — Telephone Encounter (Signed)
[phone]  PLEASE CALL PATIENT ABOUT SAMPLES FOR HIS CONSTIPATION

## 2016-01-04 DIAGNOSIS — I110 Hypertensive heart disease with heart failure: Secondary | ICD-10-CM | POA: Diagnosis not present

## 2016-01-04 DIAGNOSIS — L97512 Non-pressure chronic ulcer of other part of right foot with fat layer exposed: Secondary | ICD-10-CM | POA: Diagnosis not present

## 2016-01-04 DIAGNOSIS — J449 Chronic obstructive pulmonary disease, unspecified: Secondary | ICD-10-CM | POA: Diagnosis not present

## 2016-01-04 DIAGNOSIS — I4891 Unspecified atrial fibrillation: Secondary | ICD-10-CM | POA: Diagnosis not present

## 2016-01-04 DIAGNOSIS — I872 Venous insufficiency (chronic) (peripheral): Secondary | ICD-10-CM | POA: Diagnosis not present

## 2016-01-04 DIAGNOSIS — I509 Heart failure, unspecified: Secondary | ICD-10-CM | POA: Diagnosis not present

## 2016-01-06 ENCOUNTER — Encounter: Payer: Self-pay | Admitting: Family Medicine

## 2016-01-06 ENCOUNTER — Ambulatory Visit (INDEPENDENT_AMBULATORY_CARE_PROVIDER_SITE_OTHER): Payer: Medicare Other | Admitting: Family Medicine

## 2016-01-06 VITALS — BP 112/86 | Ht 71.0 in | Wt 205.2 lb

## 2016-01-06 DIAGNOSIS — I70223 Atherosclerosis of native arteries of extremities with rest pain, bilateral legs: Secondary | ICD-10-CM

## 2016-01-06 DIAGNOSIS — I509 Heart failure, unspecified: Secondary | ICD-10-CM | POA: Diagnosis not present

## 2016-01-06 DIAGNOSIS — G894 Chronic pain syndrome: Secondary | ICD-10-CM

## 2016-01-06 DIAGNOSIS — I482 Chronic atrial fibrillation, unspecified: Secondary | ICD-10-CM

## 2016-01-06 DIAGNOSIS — I10 Essential (primary) hypertension: Secondary | ICD-10-CM

## 2016-01-06 MED ORDER — OXYCODONE-ACETAMINOPHEN 5-325 MG PO TABS: 1.0000 | ORAL_TABLET | Freq: Three times a day (TID) | ORAL | Status: DC | PRN

## 2016-01-06 MED ORDER — VERAPAMIL HCL ER 120 MG PO TBCR: 120.0000 mg | EXTENDED_RELEASE_TABLET | Freq: Every day | ORAL | Status: DC

## 2016-01-06 NOTE — Progress Notes (Signed)
   Subjective:    Patient ID: [name] [name], male    DOB: 09/14/1932, 82 y.o.   MRN: [id number]  Hypertension This is a chronic problem. The current episode started more than 1 year ago. The problem has been gradually improving since onset. There are no associated agents to hypertension. There are no known risk factors for coronary artery disease. Treatments tried: verapamil. The current treatment provides moderate improvement. There are no compliance problems.    Patient states that he suffers with sob and his specialist told him he has CHF. He would like to talk about this today.    Chronic pain and back. See prior notes. Uses a couple narcotic tablets per day.  Ongoing history of shortness of breath with exertion. Primarily due to COPD. Also somewhat aggravated by history of atrial fibrillation. Patient notes his heart rate is still on the slow side. Next  No syncopal events no chest pain no shortness of breath  Review of Systems ROS otherwise negative    Objective:   Physical Exam Alert vitals stable BP 118/70 heart rate irregular lungs clear no wheezes no crackles no tachypnea heart rare rhythm ankles trace edema next  Chronic foot ulcer followed by specialist, complicated by peripheral arterial disease       Assessment & Plan:  Impression 1 atrial fibrillation EKG shows heart rate still too low that along with low blood pressure will lead us back off the verapamil. Discussed with patient. #2 COPD ongoing #3 chronic pain ongoing #4 chronic foot ulceration. #5 family stress discussed with wife with dementia plan decrease frontal to 120 SR. Appropriate blood work. Diet exercise discussed. Pain medicines written recheck in several months WSL

## 2016-01-07 ENCOUNTER — Encounter: Payer: Self-pay | Admitting: Family Medicine

## 2016-01-07 LAB — HEPATIC FUNCTION PANEL
ALK PHOS: 82 IU/L (ref 39–117)
ALT: 7 IU/L (ref 0–44)
AST: 10 IU/L (ref 0–40)
Albumin: 4.1 g/dL (ref 3.5–4.7)
Bilirubin Total: 0.3 mg/dL (ref 0.0–1.2)
Bilirubin, Direct: 0.13 mg/dL (ref 0.00–0.40)
Total Protein: 6.6 g/dL (ref 6.0–8.5)

## 2016-01-07 LAB — LIPID PANEL
CHOLESTEROL TOTAL: 156 mg/dL (ref 100–199)
Chol/HDL Ratio: 3.6 ratio units (ref 0.0–5.0)
HDL: 43 mg/dL (ref >39)
LDL CALC: 90 mg/dL (ref 0–99)
TRIGLYCERIDES: 116 mg/dL (ref 0–149)
VLDL CHOLESTEROL CAL: 23 mg/dL (ref 5–40)

## 2016-01-07 LAB — BASIC METABOLIC PANEL
BUN/Creatinine Ratio: 27 — ABNORMAL HIGH (ref 10–24)
BUN: 23 mg/dL (ref 8–27)
CALCIUM: 9 mg/dL (ref 8.6–10.2)
CHLORIDE: 101 mmol/L (ref 96–106)
CO2: 23 mmol/L (ref 18–29)
Creatinine, Ser: 0.86 mg/dL (ref 0.76–1.27)
GFR calc Af Amer: 94 mL/min/{1.73_m2} (ref >59)
GFR calc non Af Amer: 81 mL/min/{1.73_m2} (ref >59)
GLUCOSE: 97 mg/dL (ref 65–99)
POTASSIUM: 5 mmol/L (ref 3.5–5.2)
SODIUM: 140 mmol/L (ref 134–144)

## 2016-01-07 LAB — BRAIN NATRIURETIC PEPTIDE: BNP: 399.1 pg/mL — AB (ref 0.0–100.0)

## 2016-01-11 DIAGNOSIS — L97512 Non-pressure chronic ulcer of other part of right foot with fat layer exposed: Secondary | ICD-10-CM | POA: Diagnosis not present

## 2016-01-11 DIAGNOSIS — J449 Chronic obstructive pulmonary disease, unspecified: Secondary | ICD-10-CM | POA: Diagnosis not present

## 2016-01-11 DIAGNOSIS — I110 Hypertensive heart disease with heart failure: Secondary | ICD-10-CM | POA: Diagnosis not present

## 2016-01-11 DIAGNOSIS — I4891 Unspecified atrial fibrillation: Secondary | ICD-10-CM | POA: Diagnosis not present

## 2016-01-11 DIAGNOSIS — I872 Venous insufficiency (chronic) (peripheral): Secondary | ICD-10-CM | POA: Diagnosis not present

## 2016-01-11 DIAGNOSIS — I509 Heart failure, unspecified: Secondary | ICD-10-CM | POA: Diagnosis not present

## 2016-01-15 ENCOUNTER — Encounter: Payer: Self-pay | Admitting: Family Medicine

## 2016-01-15 ENCOUNTER — Ambulatory Visit (INDEPENDENT_AMBULATORY_CARE_PROVIDER_SITE_OTHER): Payer: Medicare Other | Admitting: Family Medicine

## 2016-01-15 VITALS — BP 122/74 | HR 78 | Temp 97.6°F | Ht 71.0 in | Wt 203.0 lb

## 2016-01-15 DIAGNOSIS — M1612 Unilateral primary osteoarthritis, left hip: Secondary | ICD-10-CM | POA: Diagnosis not present

## 2016-01-15 DIAGNOSIS — R42 Dizziness and giddiness: Secondary | ICD-10-CM

## 2016-01-15 DIAGNOSIS — I70223 Atherosclerosis of native arteries of extremities with rest pain, bilateral legs: Secondary | ICD-10-CM

## 2016-01-15 MED ORDER — FUROSEMIDE 40 MG PO TABS: ORAL_TABLET | ORAL | Status: DC

## 2016-01-15 MED ORDER — PREDNISONE 20 MG PO TABS: ORAL_TABLET | ORAL | Status: DC

## 2016-01-15 NOTE — Progress Notes (Signed)
   Subjective:    Patient ID: [name] [name], male    DOB: 05/05/1933, 82 y.o.   MRN: [id number] [name] is a very nice gentleman with lots of questions and concerns HPIdizziness started 2 days ago. Constant. Feels like room is spinning.  Started a couole days ago Feels down with it Feels unsteady Triggers-hits out of nowhere Some headche Some sinus Patient denies any high fever chills sweats   Left hip pain. Taking oxycodone 5/325. Pt has taken 2 day and no relief. He states the Percocet is not doing enough to help his pain but at the same time he states he feels woozy or dizzy at a time. ast month incrweasqed pain Hurts with movement he would like to see Dr. Romeo AppleHarrison. Dr. Romeo AppleHarrison did his right hip replacement.  Sees Dr Tanda RockersNichols for the foot sees him end of this month Hx poor circulation Patient denies the room spinning.  Review of Systems Complains of left hip pain intermittent unsteadiness dizziness. Denies nausea vomiting fever headache.    Objective:   Physical Exam  Negative nystagmus Patient some wooziness with standing Lungs clear heart regular Left hip osteoarthritis      Assessment & Plan:  Left hip osteoarthritis uses oxycodone on a regular basis follow-up if ongoing troubles I believe the patient may be getting some side effects from the oxycodone but he does not one to be on a lower strength medicine. Referral to orthopedics for further evaluation  Mild dizziness blood pressure was checked was reasonable but was in the lower range of normal keep everything as is currently follow-up if ongoing trouble  Patient was concerned about the possibility of CHF but does not one to do an echo currently he will follow-up with Dr. Brett CanalesSteve when necessary and that his regular scheduled appointments

## 2016-01-18 ENCOUNTER — Encounter: Payer: Self-pay | Admitting: Family Medicine

## 2016-01-18 DIAGNOSIS — I4891 Unspecified atrial fibrillation: Secondary | ICD-10-CM | POA: Diagnosis not present

## 2016-01-18 DIAGNOSIS — I872 Venous insufficiency (chronic) (peripheral): Secondary | ICD-10-CM | POA: Diagnosis not present

## 2016-01-18 DIAGNOSIS — J449 Chronic obstructive pulmonary disease, unspecified: Secondary | ICD-10-CM | POA: Diagnosis not present

## 2016-01-18 DIAGNOSIS — I110 Hypertensive heart disease with heart failure: Secondary | ICD-10-CM | POA: Diagnosis not present

## 2016-01-18 DIAGNOSIS — I509 Heart failure, unspecified: Secondary | ICD-10-CM | POA: Diagnosis not present

## 2016-01-18 DIAGNOSIS — L97512 Non-pressure chronic ulcer of other part of right foot with fat layer exposed: Secondary | ICD-10-CM | POA: Diagnosis not present

## 2016-01-20 DIAGNOSIS — L97512 Non-pressure chronic ulcer of other part of right foot with fat layer exposed: Secondary | ICD-10-CM | POA: Diagnosis not present

## 2016-01-20 DIAGNOSIS — L97513 Non-pressure chronic ulcer of other part of right foot with necrosis of muscle: Secondary | ICD-10-CM | POA: Diagnosis not present

## 2016-01-20 DIAGNOSIS — L97829 Non-pressure chronic ulcer of other part of left lower leg with unspecified severity: Secondary | ICD-10-CM | POA: Diagnosis not present

## 2016-01-20 DIAGNOSIS — L97519 Non-pressure chronic ulcer of other part of right foot with unspecified severity: Secondary | ICD-10-CM | POA: Diagnosis not present

## 2016-01-20 DIAGNOSIS — I70248 Atherosclerosis of native arteries of left leg with ulceration of other part of lower left leg: Secondary | ICD-10-CM | POA: Diagnosis not present

## 2016-01-20 DIAGNOSIS — I70235 Atherosclerosis of native arteries of right leg with ulceration of other part of foot: Secondary | ICD-10-CM | POA: Diagnosis not present

## 2016-01-25 DIAGNOSIS — J449 Chronic obstructive pulmonary disease, unspecified: Secondary | ICD-10-CM | POA: Diagnosis not present

## 2016-01-25 DIAGNOSIS — I4891 Unspecified atrial fibrillation: Secondary | ICD-10-CM | POA: Diagnosis not present

## 2016-01-25 DIAGNOSIS — I509 Heart failure, unspecified: Secondary | ICD-10-CM | POA: Diagnosis not present

## 2016-01-25 DIAGNOSIS — I110 Hypertensive heart disease with heart failure: Secondary | ICD-10-CM | POA: Diagnosis not present

## 2016-01-25 DIAGNOSIS — L97512 Non-pressure chronic ulcer of other part of right foot with fat layer exposed: Secondary | ICD-10-CM | POA: Diagnosis not present

## 2016-01-25 DIAGNOSIS — I872 Venous insufficiency (chronic) (peripheral): Secondary | ICD-10-CM | POA: Diagnosis not present

## 2016-02-01 ENCOUNTER — Encounter: Payer: Self-pay | Admitting: Orthopedic Surgery

## 2016-02-01 ENCOUNTER — Ambulatory Visit (INDEPENDENT_AMBULATORY_CARE_PROVIDER_SITE_OTHER): Payer: Medicare Other | Admitting: Orthopedic Surgery

## 2016-02-01 VITALS — BP 138/82 | Ht 69.0 in | Wt 195.0 lb

## 2016-02-01 DIAGNOSIS — I70223 Atherosclerosis of native arteries of extremities with rest pain, bilateral legs: Secondary | ICD-10-CM

## 2016-02-01 DIAGNOSIS — M4806 Spinal stenosis, lumbar region: Secondary | ICD-10-CM

## 2016-02-01 DIAGNOSIS — M48062 Spinal stenosis, lumbar region with neurogenic claudication: Secondary | ICD-10-CM

## 2016-02-01 NOTE — Addendum Note (Signed)
Addended by: Adella HareBOOTHE, Daimon Kean B on: 02/01/2016 05:01 PM   Modules accepted: Orders

## 2016-02-01 NOTE — Progress Notes (Signed)
Chief Complaint  Patient presents with  . Back Pain    LEFT SIDED LOW BACK PAIN (STATES HIP)   HPI 82 year old status post right total hip replacement presents with left lower back pain stiffness giving way of the left leg sharp aching pain in the left lower back which is constant and rates 5-6 out of 10 unrelieved by Aleve or prednisone or Percocet  Seems to be better with sitting and worse with being active Review of Systems  Constitutional: Positive for malaise/fatigue and weight loss.  HENT: Positive for congestion.   Respiratory: Positive for cough and shortness of breath.   Gastrointestinal: Negative.   Genitourinary: Positive for dysuria, frequency and urgency.  Musculoskeletal: Positive for back pain, joint pain and myalgias.  Skin:       Multiple ulcers right and left foot and right and left leg  Endo/Heme/Allergies: Positive for environmental allergies. Negative for polydipsia. Does not bruise/bleed easily.    Past Medical History:  Diagnosis Date  . Alcohol abuse    stop drinking since 01/2009  . Arthritis   . Asthma   . Atrial fibrillation (HCC)    off coumadin secondary to fall and subdural  hematoma   . B12 deficiency 9/10   started injection   . BPH (benign prostatic hyperplasia)   . Cancer (HCC) [facility]   BLADDER CANCER CELLS TX 2 MO AGO  . Carcinoma in situ of bladder   . Chronic kidney disease    RECENT UTI FEW MONTHS AGO TX WITH ANTIBIOTIC  . Compression fracture   . COPD (chronic obstructive pulmonary disease) (HCC)   . Diverticula of colon    few small scattered in the sigmoid colon  . Enlarged prostate   . External hemorrhoids 09/2014  . GERD (gastroesophageal reflux disease)   . H/O hiatal hernia   . Headache(784.0)   . Hypertension   . Nutcracker esophagus 2002   on EM  . Osteoporosis   . Peripheral edema   . Reflux   . Venous stasis    chronic     Past Surgical History:  Procedure Laterality Date  . BLADDER ASPIRATION     PROC FOR  CANCER CELLS   . CATARACT EXTRACTION W/PHACO  07/30/2012   Procedure: CATARACT EXTRACTION PHACO AND INTRAOCULAR LENS PLACEMENT (IOC);  Surgeon: Susa Simmonds, MD;  Location: [facility];  Service: Ophthalmology;  Laterality: Left;  CDE:31.66  . CATARACT EXTRACTION W/PHACO Right 11/12/2012   Procedure: CATARACT EXTRACTION PHACO AND INTRAOCULAR LENS PLACEMENT (IOC);  Surgeon: Susa Simmonds, MD;  Location: [facility];  Service: Ophthalmology;  Laterality: Right;  CDE 18.25  . CYSTOSCOPY  10/18/2011   Procedure: CYSTOSCOPY;  Surgeon: Anner Crete, MD;  Location: [facility];  Service: Urology;  Laterality: N/A;  . ESOPHAGOGASTRODUODENOSCOPY  02/11/2010   RMR: GE narrowing s/p dilation 56 & 58 Fr maloney otherwise normal  . ESOPHAGOGASTRODUODENOSCOPY N/A 10/01/2015   Dr.Rourk- mildly severe esophagitis, likely pill-induced, narrowed GE junction without fixed lesion found ?query achalasia, s/p maloney dilation, nomal stomach, normal second portion of the duodenum  . HERNIA REPAIR  12 YRS AGO   RIGHT SIDE   . JOINT REPLACEMENT     RT HIP X2  (1 REPLACEMENT)  . LUNG BIOPSY  10 YRS AGO   BENIGN  . MALONEY DILATION N/A 10/01/2015   Procedure: [name] DILATION;  Surgeon: Corbin Ade, MD;  Location: [facility] SU[facility];  Service: Endoscopy;  Laterality: N/A;  . PERIPHERAL VASCULAR CATHETERIZATION N/A 10/23/2015  Procedure: Abdominal Aortogram w/Lower Extremity;  Surgeon: Sherren Kerns, MD;  Location: [facility] INVASIVE CV LAB;  Service: Cardiovascular;  Laterality: N/A;  . RHINOPLASTY  1974   Family History  Problem Relation Age of Onset  . Cancer Brother   . Hypertension Mother   . Diabetes Mother   . Heart attack Mother   . Heart disease Mother    Social History  Substance Use Topics  . Smoking status: Never Smoker  . Smokeless tobacco: Former Neurosurgeon    Types: Chew    Quit date: 06/28/1991  . Alcohol use No     Comment: history of alcohol abuse stop drinking 8/10 . Alcohol abuse for about 25 years     Current  Outpatient Prescriptions:  .  aspirin 325 MG tablet, Take 325 mg by mouth daily., Disp: , Rfl:  .  escitalopram (LEXAPRO) 10 MG tablet, Take 1 tablet (10 mg total) by mouth daily., Disp: 30 tablet, Rfl: 2 .  naproxen sodium (ANAPROX) 220 MG tablet, Take 220 mg by mouth 2 (two) times daily with a meal., Disp: , Rfl:  .  Niacin (VITAMIN B-3 PO), Take by mouth., Disp: , Rfl:  .  Omega-3 Fatty Acids (FISH OIL PO), Take by mouth., Disp: , Rfl:  .  OVER THE COUNTER MEDICATION, Calcium, Disp: , Rfl:  .  OVER THE COUNTER MEDICATION, Take 1 tablet by mouth daily. Vitamin D, Disp: , Rfl:  .  oxyCODONE-acetaminophen (ROXICET) 5-325 MG tablet, Take 1 tablet by mouth every 8 (eight) hours as needed for severe pain., Disp: 60 tablet, Rfl: 0 .  pantoprazole (PROTONIX) 40 MG tablet, Take 1 tablet (40 mg total) by mouth daily. (Patient taking differently: Take 40 mg by mouth daily as needed (reflux). ), Disp: 90 tablet, Rfl: 3 .  verapamil (CALAN-SR) 120 MG CR tablet, Take 1 tablet (120 mg total) by mouth daily., Disp: 30 tablet, Rfl: 5 .  alendronate (FOSAMAX) 70 MG tablet, Take 1 tablet (70 mg total) by mouth every 7 (seven) days. Take with a full glass of water on an empty stomach. (Patient not taking: Reported on 02/01/2016), Disp: 4 tablet, Rfl: 11 .  Doxylamine Succinate, Sleep, (SLEEP AID PO), Take 1-2 tablets by mouth at bedtime as needed (sleep)., Disp: , Rfl:  .  furosemide (LASIX) 40 MG tablet, Take 2 tablets every morning (Patient not taking: Reported on 02/01/2016), Disp: 30 tablet, Rfl: 5 .  naloxegol oxalate (MOVANTIK) 25 MG TABS tablet, Take 1 tablet (25 mg total) by mouth daily. (Patient not taking: Reported on 02/01/2016), Disp: 30 tablet, Rfl: 5 .  potassium chloride SA (KLOR-CON M20) 20 MEQ tablet, Take 1 tablet (20 mEq total) by mouth daily. (Patient not taking: Reported on 02/01/2016), Disp: 30 tablet, Rfl: 0 .  predniSONE (DELTASONE) 20 MG tablet, 3qd for 2d then 2qd for 2d then 1qd for 2d (Patient not  taking: Reported on 02/01/2016), Disp: 12 tablet, Rfl: 0  BP 138/82   Ht 5\' 9"  (1.753 m)   Wt 195 lb (88.5 kg)   BMI 28.80 kg/m   Physical Exam  Constitutional: He is oriented to person, place, and time. He appears well-developed and well-nourished. No distress.  Cardiovascular: Normal rate and intact distal pulses.   Neurological: He is alert and oriented to person, place, and time.  Skin: No rash noted. He is not diaphoretic. No erythema. No pallor.  Multiple ulcers on the right foot dorsal and plantar terrible skin pretibial area bilaterally  Psychiatric: He has  a normal mood and affect. His behavior is normal. Judgment and thought content normal.    Ortho Exam Right lower extremity normal right hip range of motion no tenderness hip is stable muscle tone is normal skin is poor and the leg distal pulses weak sensation is normal  Left lower extremity hip range of motion is normal hip stability is normal leg strength is normal skin is poor pulses are weak sensation is normal and he has no tenderness in the groin or thigh  Left lower back tenderness left buttock tenderness or range of motion lumbar spine  ASSESSMENT: Last left hip film was in 2016 he had mild arthritis there he had a MRI in 2017 and February he has multilevel disc disease with bilateral L5 pars defect anterolisthesis L5 on S1 mild to moderate foraminal narrowing at that level  Spinal stenosis PLAN ESI at L5-S1 follow-up with primary care physician  Fuller CanadaStanley Jamez Ambrocio, MD 02/01/2016 10:23 AM

## 2016-02-01 NOTE — Patient Instructions (Signed)
ESI at L5-S1  Follow-up with primary care physician

## 2016-02-02 ENCOUNTER — Other Ambulatory Visit: Payer: Self-pay | Admitting: Internal Medicine

## 2016-02-03 DIAGNOSIS — I4891 Unspecified atrial fibrillation: Secondary | ICD-10-CM | POA: Diagnosis not present

## 2016-02-03 DIAGNOSIS — J449 Chronic obstructive pulmonary disease, unspecified: Secondary | ICD-10-CM | POA: Diagnosis not present

## 2016-02-03 DIAGNOSIS — I110 Hypertensive heart disease with heart failure: Secondary | ICD-10-CM | POA: Diagnosis not present

## 2016-02-03 DIAGNOSIS — I872 Venous insufficiency (chronic) (peripheral): Secondary | ICD-10-CM | POA: Diagnosis not present

## 2016-02-03 DIAGNOSIS — L97512 Non-pressure chronic ulcer of other part of right foot with fat layer exposed: Secondary | ICD-10-CM | POA: Diagnosis not present

## 2016-02-03 DIAGNOSIS — I509 Heart failure, unspecified: Secondary | ICD-10-CM | POA: Diagnosis not present

## 2016-02-05 ENCOUNTER — Ambulatory Visit (INDEPENDENT_AMBULATORY_CARE_PROVIDER_SITE_OTHER): Payer: Medicare Other | Admitting: Urology

## 2016-02-05 DIAGNOSIS — Z8551 Personal history of malignant neoplasm of bladder: Secondary | ICD-10-CM | POA: Diagnosis not present

## 2016-02-08 ENCOUNTER — Other Ambulatory Visit: Payer: Self-pay

## 2016-02-08 ENCOUNTER — Telehealth: Payer: Self-pay | Admitting: Family Medicine

## 2016-02-08 ENCOUNTER — Ambulatory Visit (HOSPITAL_COMMUNITY)
Admission: RE | Admit: 2016-02-08 | Discharge: 2016-02-08 | Disposition: A | Discharge status: Home / Self Care | Payer: Medicare Other | Source: Ambulatory Visit | Attending: Family Medicine | Admitting: Family Medicine

## 2016-02-08 ENCOUNTER — Inpatient Hospital Stay: Admission: RE | Admit: 2016-02-08 | Payer: Medicare Other | Source: Ambulatory Visit

## 2016-02-08 DIAGNOSIS — L97512 Non-pressure chronic ulcer of other part of right foot with fat layer exposed: Secondary | ICD-10-CM | POA: Diagnosis not present

## 2016-02-08 DIAGNOSIS — R059 Cough, unspecified: Secondary | ICD-10-CM

## 2016-02-08 DIAGNOSIS — R05 Cough: Secondary | ICD-10-CM | POA: Insufficient documentation

## 2016-02-08 DIAGNOSIS — R0602 Shortness of breath: Secondary | ICD-10-CM

## 2016-02-08 DIAGNOSIS — I4891 Unspecified atrial fibrillation: Secondary | ICD-10-CM | POA: Diagnosis not present

## 2016-02-08 DIAGNOSIS — I517 Cardiomegaly: Secondary | ICD-10-CM | POA: Insufficient documentation

## 2016-02-08 DIAGNOSIS — J449 Chronic obstructive pulmonary disease, unspecified: Secondary | ICD-10-CM | POA: Diagnosis not present

## 2016-02-08 DIAGNOSIS — I872 Venous insufficiency (chronic) (peripheral): Secondary | ICD-10-CM | POA: Diagnosis not present

## 2016-02-08 DIAGNOSIS — I110 Hypertensive heart disease with heart failure: Secondary | ICD-10-CM | POA: Diagnosis not present

## 2016-02-08 DIAGNOSIS — I509 Heart failure, unspecified: Secondary | ICD-10-CM | POA: Diagnosis not present

## 2016-02-08 MED ORDER — CLARITHROMYCIN 500 MG PO TABS: 500.0000 mg | ORAL_TABLET | Freq: Two times a day (BID) | ORAL | 0 refills | Status: DC

## 2016-02-08 NOTE — Telephone Encounter (Signed)
Ok lets do a chest xray

## 2016-02-08 NOTE — Telephone Encounter (Signed)
Calling to let Dr. Brett CanalesSteve know that compared to last visit with our office, patient is SOB with resting, cough productive with clear white mucous.  His oxygen is 93%, his normal is 94-95%.  Neysa BonitoChristy said that she doesn't think the ER is necessary, but possible chest x-ray.  Please advise.

## 2016-02-08 NOTE — Telephone Encounter (Signed)
Chest xray ordered. Nurse Neysa BonitoChristy was notified and will contact patient to have him report to hospital.

## 2016-02-09 ENCOUNTER — Emergency Department (HOSPITAL_COMMUNITY)
Admission: EM | Admit: 2016-02-09 | Discharge: 2016-02-09 | Disposition: A | Discharge status: Home / Self Care | Payer: Medicare Other | Attending: Emergency Medicine | Admitting: Emergency Medicine

## 2016-02-09 ENCOUNTER — Encounter (HOSPITAL_COMMUNITY): Payer: Self-pay | Admitting: *Deleted

## 2016-02-09 ENCOUNTER — Ambulatory Visit (INDEPENDENT_AMBULATORY_CARE_PROVIDER_SITE_OTHER): Payer: Medicare Other | Admitting: Family Medicine

## 2016-02-09 ENCOUNTER — Encounter: Payer: Self-pay | Admitting: Family Medicine

## 2016-02-09 ENCOUNTER — Ambulatory Visit: Payer: Medicare Other | Admitting: Internal Medicine

## 2016-02-09 ENCOUNTER — Emergency Department (HOSPITAL_COMMUNITY): Payer: Medicare Other

## 2016-02-09 VITALS — BP 118/72 | Temp 97.8°F | Ht 69.0 in

## 2016-02-09 DIAGNOSIS — R0602 Shortness of breath: Secondary | ICD-10-CM | POA: Insufficient documentation

## 2016-02-09 DIAGNOSIS — Z8551 Personal history of malignant neoplasm of bladder: Secondary | ICD-10-CM | POA: Insufficient documentation

## 2016-02-09 DIAGNOSIS — I70223 Atherosclerosis of native arteries of extremities with rest pain, bilateral legs: Secondary | ICD-10-CM

## 2016-02-09 DIAGNOSIS — N189 Chronic kidney disease, unspecified: Secondary | ICD-10-CM | POA: Insufficient documentation

## 2016-02-09 DIAGNOSIS — R0902 Hypoxemia: Secondary | ICD-10-CM | POA: Diagnosis not present

## 2016-02-09 DIAGNOSIS — Z791 Long term (current) use of non-steroidal anti-inflammatories (NSAID): Secondary | ICD-10-CM | POA: Insufficient documentation

## 2016-02-09 DIAGNOSIS — I130 Hypertensive heart and chronic kidney disease with heart failure and stage 1 through stage 4 chronic kidney disease, or unspecified chronic kidney disease: Secondary | ICD-10-CM | POA: Insufficient documentation

## 2016-02-09 DIAGNOSIS — Z792 Long term (current) use of antibiotics: Secondary | ICD-10-CM | POA: Insufficient documentation

## 2016-02-09 DIAGNOSIS — R0609 Other forms of dyspnea: Secondary | ICD-10-CM

## 2016-02-09 DIAGNOSIS — R0989 Other specified symptoms and signs involving the circulatory and respiratory systems: Secondary | ICD-10-CM | POA: Diagnosis not present

## 2016-02-09 DIAGNOSIS — J45909 Unspecified asthma, uncomplicated: Secondary | ICD-10-CM | POA: Diagnosis not present

## 2016-02-09 DIAGNOSIS — J449 Chronic obstructive pulmonary disease, unspecified: Secondary | ICD-10-CM | POA: Insufficient documentation

## 2016-02-09 DIAGNOSIS — Z79891 Long term (current) use of opiate analgesic: Secondary | ICD-10-CM | POA: Diagnosis not present

## 2016-02-09 DIAGNOSIS — I509 Heart failure, unspecified: Secondary | ICD-10-CM | POA: Insufficient documentation

## 2016-02-09 DIAGNOSIS — J431 Panlobular emphysema: Secondary | ICD-10-CM

## 2016-02-09 DIAGNOSIS — R0600 Dyspnea, unspecified: Secondary | ICD-10-CM

## 2016-02-09 DIAGNOSIS — Z79899 Other long term (current) drug therapy: Secondary | ICD-10-CM | POA: Insufficient documentation

## 2016-02-09 DIAGNOSIS — Z7982 Long term (current) use of aspirin: Secondary | ICD-10-CM | POA: Insufficient documentation

## 2016-02-09 LAB — CBC WITH DIFFERENTIAL/PLATELET
Basophils Absolute: 0 10*3/uL (ref 0.0–0.1)
Basophils Relative: 0 %
EOS ABS: 0.3 10*3/uL (ref 0.0–0.7)
Eosinophils Relative: 4 %
HEMATOCRIT: 30.8 % — AB (ref 39.0–52.0)
HEMOGLOBIN: 9.9 g/dL — AB (ref 13.0–17.0)
LYMPHS ABS: 1 10*3/uL (ref 0.7–4.0)
LYMPHS PCT: 13 %
MCH: 30.2 pg (ref 26.0–34.0)
MCHC: 32.1 g/dL (ref 30.0–36.0)
MCV: 93.9 fL (ref 78.0–100.0)
MONOS PCT: 5 %
Monocytes Absolute: 0.3 10*3/uL (ref 0.1–1.0)
NEUTROS ABS: 5.9 10*3/uL (ref 1.7–7.7)
NEUTROS PCT: 78 %
Platelets: 342 10*3/uL (ref 150–400)
RBC: 3.28 MIL/uL — ABNORMAL LOW (ref 4.22–5.81)
RDW: 14.2 % (ref 11.5–15.5)
WBC: 7.5 10*3/uL (ref 4.0–10.5)

## 2016-02-09 LAB — COMPREHENSIVE METABOLIC PANEL
ALK PHOS: 73 U/L (ref 38–126)
ALT: 12 U/L — AB (ref 17–63)
ANION GAP: 7 (ref 5–15)
AST: 14 U/L — ABNORMAL LOW (ref 15–41)
Albumin: 3.2 g/dL — ABNORMAL LOW (ref 3.5–5.0)
BILIRUBIN TOTAL: 0.7 mg/dL (ref 0.3–1.2)
BUN: 25 mg/dL — ABNORMAL HIGH (ref 6–20)
CALCIUM: 8.4 mg/dL — AB (ref 8.9–10.3)
CO2: 27 mmol/L (ref 22–32)
CREATININE: 0.74 mg/dL (ref 0.61–1.24)
Chloride: 99 mmol/L — ABNORMAL LOW (ref 101–111)
GFR calc non Af Amer: >60 mL/min (ref >60)
Glucose, Bld: 93 mg/dL (ref 65–99)
Potassium: 3.9 mmol/L (ref 3.5–5.1)
Sodium: 133 mmol/L — ABNORMAL LOW (ref 135–145)
TOTAL PROTEIN: 6.7 g/dL (ref 6.5–8.1)

## 2016-02-09 LAB — BRAIN NATRIURETIC PEPTIDE: B Natriuretic Peptide: 344 pg/mL — ABNORMAL HIGH (ref 0.0–100.0)

## 2016-02-09 MED ORDER — LEVOFLOXACIN 500 MG PO TABS: 500.0000 mg | ORAL_TABLET | Freq: Every day | ORAL | 0 refills | Status: DC

## 2016-02-09 MED ORDER — LEVOFLOXACIN 500 MG PO TABS
500.0000 mg | ORAL_TABLET | Freq: Once | ORAL | Status: AC
  Administered 2016-02-09: 500 mg via ORAL
  Filled 2016-02-09: qty 1

## 2016-02-09 NOTE — Progress Notes (Signed)
   Subjective:    Patient ID: [name] [name], male    DOB: 07/28/1932, 82 y.o.   MRN: [id number]  HPI  Patient arrives with c/o SOB and cough for about a week-patient states he is worried about CHF Patients had approximately 1 week and half of chest congestion coughing up whitish phlegm denies hemoptysis. Denies sweats or chills. States energy level subpar. PMH benign. Review of Systems Patient relates PND patient relates chest congestion coughing in addition to this gets short of breath with activity. Denies chest pressure. Relates coughing up phlegm. Denies vomiting diarrhea fever chills.    Objective:   Physical Exam Crackles in the lungs more on the right side than the left side heart regular pulse normal blood pressure 120/82 extremities 1-2+ edema in the lower legs. O2 saturation when the patient first came in was 84% it rose up to 88% by the end of the visit  25 minutes was spent with the patient. Greater than half the time was spent in discussion and answering questions and counseling regarding the issues that the patient came in for today.      Assessment & Plan:  PMD/DOE/hypoxia-probable congestive heart failure although cannot rule out the possibility pneumonia this is a serious situation it is highly recommended to go to the ER I discussed this with the patient I also discussed this with his son. They agreed to go to the ER. ER doctor was spoken with.  Patient instructed to follow-up within a few days of ER visit or hospitalization

## 2016-02-09 NOTE — Discharge Instructions (Signed)
Follow up with your md in 2-3 days.  Return sooner if any problems

## 2016-02-09 NOTE — ED Triage Notes (Signed)
Pt was sent here by PCP for low oxygen saturations. Pt states he had been feeling short of breath on and off for the last week. Pt has no dyspnea upon triage.

## 2016-02-09 NOTE — ED Notes (Signed)
Nurse aide assit patient walking 50 ft o2 97%

## 2016-02-09 NOTE — ED Provider Notes (Signed)
[facility] Provider Note   CSN: [id number] Arrival date & time: 02/09/16  1631     History   Chief Complaint Chief Complaint  Patient presents with  . Shortness of Breath    HPI [name] [name] is a 82 y.o. male.  Pt has had some cough and sob for a few days.  Pt was hypoxic in the md office   The history is provided by the patient. No language interpreter was used.  Shortness of Breath  This is a new problem. The problem occurs intermittently.The current episode started more than 2 days ago. The problem has not changed since onset.Pertinent negatives include no headaches, no ear pain, no cough, no chest pain, no abdominal pain and no rash. It is unknown what precipitated the problem. Risk factors: COPD. He has tried nothing for the symptoms. The treatment provided no relief. He has had prior hospitalizations. He has had prior ED visits. Associated medical issues include COPD.    Past Medical History:  Diagnosis Date  . Alcohol abuse    stop drinking since 01/2009  . Arthritis   . Asthma   . Atrial fibrillation (HCC)    off coumadin secondary to fall and subdural  hematoma   . B12 deficiency 9/10   started injection   . BPH (benign prostatic hyperplasia)   . Cancer (HCC) [facility]   BLADDER CANCER CELLS TX 2 MO AGO  . Carcinoma in situ of bladder   . Chronic kidney disease    RECENT UTI FEW MONTHS AGO TX WITH ANTIBIOTIC  . Compression fracture   . COPD (chronic obstructive pulmonary disease) (HCC)   . Diverticula of colon    few small scattered in the sigmoid colon  . Enlarged prostate   . External hemorrhoids 09/2014  . GERD (gastroesophageal reflux disease)   . H/O hiatal hernia   . Headache(784.0)   . Hypertension   . Nutcracker esophagus 2002   on EM  . Osteoporosis   . Peripheral edema   . Reflux   . Venous stasis    chronic     Patient Active Problem List   Diagnosis Date Noted  . Atherosclerosis of native arteries of extremity with rest pain  (HCC) 10/21/2015  . Acute esophagitis   . Congestive heart failure, unspecified 01/01/2014  . Chronic pain syndrome 10/10/2013  . S/P total hip arthroplasty 05/14/2013  . Osteoporosis, unspecified 04/23/2013  . Chronic atrial fibrillation (HCC) 09/19/2012  . Essential hypertension, benign 09/19/2012  . Status post hip replacement 04/12/2012  . Spinal stenosis of lumbar region 05/03/2011  . TOTAL HIP FOLLOW-UP 09/13/2010  . NONUNION OF FRACTURE 07/08/2010  . HIP FRACTURE, RIGHT 07/08/2010  . ALCOHOL ABUSE 05/15/2009  . ESOPHAGEAL MOTILITY DISORDER 05/15/2009  . GERD 05/15/2009  . Constipation 05/15/2009  . WEIGHT LOSS, RECENT 05/15/2009  . EXTERNAL HEMORRHOIDS 05/13/2009  . COPD (chronic obstructive pulmonary disease) (HCC) 05/13/2009  . Dysphagia 05/13/2009  . COMPRESSION FRACTURE, SPINE 03/19/2009  . HIGH BLOOD PRESSURE 03/19/2009    Past Surgical History:  Procedure Laterality Date  . BLADDER ASPIRATION     PROC FOR CANCER CELLS   . CATARACT EXTRACTION W/PHACO  07/30/2012   Procedure: CATARACT EXTRACTION PHACO AND INTRAOCULAR LENS PLACEMENT (IOC);  Surgeon: Susa Simmonds, MD;  Location: [facility] ORS;  Service: Ophthalmology;  Laterality: Left;  CDE:31.66  . CATARACT EXTRACTION W/PHACO Right 11/12/2012   Procedure: CATARACT EXTRACTION PHACO AND INTRAOCULAR LENS PLACEMENT (IOC);  Surgeon: Susa Simmonds, MD;  Location: [facility];  Service: Ophthalmology;  Laterality: Right;  CDE 18.25  . CYSTOSCOPY  10/18/2011   Procedure: CYSTOSCOPY;  Surgeon: Anner CreteJohn J Wrenn, MD;  Location: [facility] OR[facility];  Service: Urology;  Laterality: N/A;  . ESOPHAGOGASTRODUODENOSCOPY  02/11/2010   RMR: GE narrowing s/p dilation 56 & 58 Fr maloney otherwise normal  . ESOPHAGOGASTRODUODENOSCOPY N/A 10/01/2015   Dr.Rourk- mildly severe esophagitis, likely pill-induced, narrowed GE junction without fixed lesion found ?query achalasia, s/p maloney dilation, nomal stomach, normal second portion of the duodenum  . HERNIA REPAIR  12  YRS AGO   RIGHT SIDE   . JOINT REPLACEMENT     RT HIP X2  (1 REPLACEMENT)  . LUNG BIOPSY  10 YRS AGO   BENIGN  . MALONEY DILATION N/A 10/01/2015   Procedure: Elease HashimotoMALONEY DILATION;  Surgeon: Corbin Adeobert M Rourk, MD;  Location: [facility] ENDO SUITE;  Service: Endoscopy;  Laterality: N/A;  . PERIPHERAL VASCULAR CATHETERIZATION N/A 10/23/2015   Procedure: Abdominal Aortogram w/Lower Extremity;  Surgeon: Sherren Kernsharles E Fields, MD;  [facility];  Service: Cardiovascular;  Laterality: N/A;  . RHINOPLASTY  1974       Home Medications    Prior to Admission medications   Medication Sig Start Date End Date Taking? Authorizing Provider  alendronate (FOSAMAX) 70 MG tablet Take 1 tablet (70 mg total) by mouth every 7 (seven) days. Take with a full glass of water on an empty stomach. 04/22/13  Yes Merlyn AlbertWilliam S Luking, MD  aspirin 325 MG tablet Take 325 mg by mouth daily.   Yes Historical Provider, MD  CALCIUM PO Take 1 tablet by mouth daily.   Yes Historical Provider, MD  Cholecalciferol (VITAMIN D PO) Take 1 capsule by mouth daily.   Yes Historical Provider, MD  clarithromycin (BIAXIN) 500 MG tablet Take 1 tablet (500 mg total) by mouth 2 (two) times daily. X 10 days Patient taking differently: Take 500 mg by mouth 2 (two) times daily. 10 day course starting on 02/08/2016 02/08/16  Yes Merlyn AlbertWilliam S Luking, MD  escitalopram (LEXAPRO) 10 MG tablet Take 1 tablet (10 mg total) by mouth daily. 08/21/15 08/20/16 Yes Merlyn AlbertWilliam S Luking, MD  furosemide (LASIX) 40 MG tablet Take 2 tablets every morning Patient taking differently: Take 80 mg by mouth daily as needed for fluid.  01/15/16  Yes Babs SciaraScott A Luking, MD  naproxen sodium (ANAPROX) 220 MG tablet Take 440 mg by mouth 2 (two) times daily with a meal.    Yes Historical Provider, MD  oxyCODONE-acetaminophen (ROXICET) 5-325 MG tablet Take 1 tablet by mouth every 8 (eight) hours as needed for severe pain. Patient taking differently: Take 1 tablet by mouth 2 (two) times daily.  *May take a third tablet as needed for pain 01/06/16  Yes Merlyn AlbertWilliam S Luking, MD  pantoprazole (PROTONIX) 40 MG tablet Take 1 tablet (40 mg total) by mouth daily. Patient taking differently: Take 40 mg by mouth daily as needed (reflux).  09/10/15  Yes Anice PaganiniEric A Gill, NP  sucralfate (CARAFATE) 1 g tablet DISSOLVE 1 TABLET IN 2 OZ WATER AND DRINK BEFORE MEALS AND AT BEDTIMEFOR 2 WEEKS. 02/04/16  Yes Anice PaganiniEric A Gill, NP  verapamil (CALAN-SR) 120 MG CR tablet Take 1 tablet (120 mg total) by mouth daily. 01/06/16  Yes Merlyn AlbertWilliam S Luking, MD  levofloxacin (LEVAQUIN) 500 MG tablet Take 1 tablet (500 mg total) by mouth daily. 02/09/16   Bethann BerkshireJoseph Ellanor Feuerstein, MD    Family History Family History  Problem Relation Age of Onset  .  Cancer Brother   . Hypertension Mother   . Diabetes Mother   . Heart attack Mother   . Heart disease Mother     Social History Social History  Substance Use Topics  . Smoking status: Never Smoker  . Smokeless tobacco: Former Neurosurgeon    Types: Chew    Quit date: 06/28/1991  . Alcohol use No     Comment: history of alcohol abuse stop drinking 8/10 . Alcohol abuse for about 25 years      Allergies   Oxycodone-acetaminophen; Tramadol; and Sulfonamide derivatives   Review of Systems Review of Systems  Constitutional: Negative for appetite change and fatigue.  HENT: Negative for congestion, ear discharge, ear pain and sinus pressure.   Eyes: Negative for discharge.  Respiratory: Positive for shortness of breath. Negative for cough.   Cardiovascular: Negative for chest pain.  Gastrointestinal: Negative for abdominal pain and diarrhea.  Genitourinary: Negative for frequency and hematuria.  Musculoskeletal: Negative for back pain.  Skin: Negative for rash.  Neurological: Negative for seizures and headaches.  Psychiatric/Behavioral: Negative for hallucinations.     Physical Exam Updated Vital Signs BP 142/65 (BP Location: Left Arm)   Pulse 67   Temp 97.6 F (36.4 C) (Oral)   Resp 20    Ht 5\' 11"  (1.803 m)   Wt 193 lb (87.5 kg)   SpO2 94%   BMI 26.92 kg/m   Physical Exam  Constitutional: He is oriented to person, place, and time. He appears well-developed.  HENT:  Head: Normocephalic.  Eyes: Conjunctivae and EOM are normal. No scleral icterus.  Neck: Neck supple. No thyromegaly present.  Cardiovascular: Normal rate and regular rhythm.  Exam reveals no gallop and no friction rub.   No murmur heard. Pulmonary/Chest: No stridor. He has no wheezes. He has no rales. He exhibits no tenderness.  Abdominal: He exhibits no distension. There is no tenderness. There is no rebound.  Musculoskeletal: Normal range of motion. He exhibits no edema.  Lymphadenopathy:    He has no cervical adenopathy.  Neurological: He is oriented to person, place, and time. He exhibits normal muscle tone. Coordination normal.  Skin: No rash noted. No erythema.  Psychiatric: He has a normal mood and affect. His behavior is normal.     ED Treatments / Results  Labs (all labs ordered are listed, but only abnormal results are displayed) Labs Reviewed  CBC WITH DIFFERENTIAL/PLATELET - Abnormal; Notable for the following:       Result Value   RBC 3.28 (*)    Hemoglobin 9.9 (*)    HCT 30.8 (*)    All other components within normal limits  COMPREHENSIVE METABOLIC PANEL - Abnormal; Notable for the following:    Sodium 133 (*)    Chloride 99 (*)    BUN 25 (*)    Calcium 8.4 (*)    Albumin 3.2 (*)    AST 14 (*)    ALT 12 (*)    All other components within normal limits  BRAIN NATRIURETIC PEPTIDE - Abnormal; Notable for the following:    B Natriuretic Peptide 344.0 (*)    All other components within normal limits    EKG  EKG Interpretation  Date/Time:  Tuesday February 09 2016 16:39:56 EDT Ventricular Rate:  57 PR Interval:    QRS Duration: 120 QT Interval:  476 QTC Calculation: 463 R Axis:   66 Text Interpretation:  RSR' or QR pattern in V1 suggests right ventricular conduction delay  Nonspecific ST and T  wave abnormality Abnormal ECG a.fib Confirmed by [facility]urgic[facility] LtdAVILAND MD, JULIE ([id number]) on 02/09/2016 4:45:31 PM       Radiology Dg Chest 2 View  Result Date: 02/09/2016 CLINICAL DATA:  Shortness of breath for 6 weeks. EXAM: CHEST  2 VIEW COMPARISON:  February 08, 2016 FINDINGS: No pneumothorax. Cardiomegaly is identified. The hila and mediastinum are unchanged. Diffuse increased reticular/ interstitial markings in the lungs which has worsened since December 26, 2013 but is stable since yesterday. More focal opacities are seen scattered throughout the lungs, similar since yesterday. No other acute abnormalities. Flattening of the diaphragms consistent with increase lung volumes. Probable tiny effusions. IMPRESSION: Diffuse interstitial/reticular opacities throughout the lungs with scattered more focal opacities in both lungs as well. The findings are unchanged since yesterday but worsened since December 26, 2013. While there may be a chronic component, a superimposed acute process such as multi focal pneumonia is certainly not excluded. Recommend treatment with short-term follow-up. Electronically Signed   By: Gerome Samavid  Williams III M.D   On: 02/09/2016 17:12   Dg Chest 2 View  Result Date: 02/08/2016 CLINICAL DATA:  Cough and shortness of breath for the past 2 months with increasing symptoms over the past month. EXAM: CHEST  2 VIEW COMPARISON:  PA and lateral chest x-ray of December 26, 2013 FINDINGS: The lungs remain well-expanded. The interstitial markings have increased considerably, bilaterally. Near confluent density is present inferiorly in the right upper lobe. The cardiac silhouette remains enlarged. The central pulmonary vascularity is mildly prominent. There is mild wedge compression of the body of T12 which is stable. IMPRESSION: COPD with superimposed pulmonary fibrotic changes. Acute interstitial pneumonia or other acute interstitial process is not excluded. Cardiomegaly without definite pulmonary  edema. Followup PA and lateral chest X-ray is recommended in 3-4 weeks following trial of antibiotic therapy to ensure resolution and exclude underlying malignancy. Chest CT scanning may be ultimately needed to evaluate the pulmonary parenchyma more completely. Electronically Signed   By: David  SwazilandJordan M.D.   On: 02/08/2016 13:13    Procedures Procedures (including critical care time)  Medications Ordered in ED Medications  levofloxacin (LEVAQUIN) tablet 500 mg (not administered)     Initial Impression / Assessment and Plan / ED Course  I have reviewed the triage vital signs and the nursing notes.  Pertinent labs & imaging results that were available during my care of the patient were reviewed by me and considered in my medical decision making (see chart for details).  Clinical Course  pt with possible pneumonia on chest x-ray.  Pt not hypoxic in ED.  Pt ambulated with out getting hypoxic.  He did not want to be admitted to the hospital because he needs to take care of his demented wife.  Pt was put on levaquin and will follow up with his md in 2-3 days    Final Clinical Impressions(s) / ED Diagnoses   Final diagnoses:  None    New Prescriptions New Prescriptions   LEVOFLOXACIN (LEVAQUIN) 500 MG TABLET    Take 1 tablet (500 mg total) by mouth daily.     Bethann BerkshireJoseph Sheala Dosh, MD 02/09/16 2107

## 2016-02-12 ENCOUNTER — Encounter: Payer: Self-pay | Admitting: Family Medicine

## 2016-02-12 ENCOUNTER — Ambulatory Visit (INDEPENDENT_AMBULATORY_CARE_PROVIDER_SITE_OTHER): Payer: Medicare Other | Admitting: Family Medicine

## 2016-02-12 VITALS — BP 118/82 | Ht 69.0 in

## 2016-02-12 DIAGNOSIS — R0600 Dyspnea, unspecified: Secondary | ICD-10-CM

## 2016-02-12 DIAGNOSIS — J189 Pneumonia, unspecified organism: Secondary | ICD-10-CM

## 2016-02-12 DIAGNOSIS — J441 Chronic obstructive pulmonary disease with (acute) exacerbation: Secondary | ICD-10-CM | POA: Diagnosis not present

## 2016-02-12 DIAGNOSIS — I70223 Atherosclerosis of native arteries of extremities with rest pain, bilateral legs: Secondary | ICD-10-CM | POA: Diagnosis not present

## 2016-02-12 NOTE — Progress Notes (Signed)
   Subjective:    Patient ID: [name] [name], male    DOB: 11/23/1932, 82 y.o.   MRN: [id number]  HPI  Patient arrives for a follow up on recent ED visit for shortness of breath.  He states he was placed on antibiotic-levaquin and told to follow up today  Complete emergency room record is reviewed at length  Patient progress with ongoing shortness of breath. Went to the emergency room. Workup there revealed a patchy chest x-ray suggestive of pneumonia. In addition BNP was up somewhat in the 300s.  We had artery adjusted the patient's diuretic.  Patient notes cough productive at times. Next  Patient notes increased shortness of breath. Fortunately oxygenation has maintained reasonable.  ER doctor considered hospitalization the patient was resistant to this.  Review of Systems No headache, no major weight loss or weight gain, no chest pain no back pain abdominal pain no change in bowel habits complete ROS otherwise negative     Objective:   Physical Exam  Alert elderly male appearing his stated age. O2 sat decent 93% good vitals lungs basilar crackles. No tachypnea. No significant ankle edema. Heart regular rate and rhythm.  All labs and x-rays reviewed      Assessment & Plan:  Impression potential pneumonia with exacerbation of COPD #2 history congestive heart failure clinically stable plan continue antibiotics. Use albuterol when necessary. Warning signs discussed carefully. No need for oxygen supplementation this time. Discussed. PE x-ray in a couple weeks. Visit after that. Warning signs discussed. 25 minutes spent most in discussion

## 2016-02-15 DIAGNOSIS — I4891 Unspecified atrial fibrillation: Secondary | ICD-10-CM | POA: Diagnosis not present

## 2016-02-15 DIAGNOSIS — L97512 Non-pressure chronic ulcer of other part of right foot with fat layer exposed: Secondary | ICD-10-CM | POA: Diagnosis not present

## 2016-02-15 DIAGNOSIS — I872 Venous insufficiency (chronic) (peripheral): Secondary | ICD-10-CM | POA: Diagnosis not present

## 2016-02-15 DIAGNOSIS — I509 Heart failure, unspecified: Secondary | ICD-10-CM | POA: Diagnosis not present

## 2016-02-15 DIAGNOSIS — I110 Hypertensive heart disease with heart failure: Secondary | ICD-10-CM | POA: Diagnosis not present

## 2016-02-15 DIAGNOSIS — J449 Chronic obstructive pulmonary disease, unspecified: Secondary | ICD-10-CM | POA: Diagnosis not present

## 2016-02-16 ENCOUNTER — Telehealth: Payer: Self-pay | Admitting: Family Medicine

## 2016-02-16 NOTE — Telephone Encounter (Signed)
[facility] and informed her that Dr Brett CanalesSteve does recommend that the patient start the Biaxin and follow up if continued problems and ER if worse. Lanora Manislizabeth verbalized understanding and will inform the patient.

## 2016-02-16 NOTE — Telephone Encounter (Signed)
Sorry biaxin is clarithromycin, the h h nurse statrd he already had a clarithromycin and wondered if he should add this and the anxwer is yes

## 2016-02-16 NOTE — Telephone Encounter (Signed)
Go ahead and add biaxin to current lev dosing

## 2016-02-16 NOTE — Telephone Encounter (Signed)
Would you like Biaxin 500 Mg to be twice a day for 10 days?

## 2016-02-16 NOTE — Telephone Encounter (Signed)
Received a phone call from patient's home health nurse Lanora Manislizabeth with [facility]. Patient home health nurse stated that patient was seen on 02/12/2016 for CAP. Patient is currently still experiencing SOB, and crackles are heard in lungs, Oxygen saturation on room Air is 92%, no fever noted. Patient was seen at ED and Prescribed Levaquin in which he still has a few days left and he was also prescribed Clarithromycin but has not started on it yet. Home health nurse wanted to know should he start on the Clarithromycin or complete another round of Levaquin. Please advise?  Call home health nurse back at [phone]

## 2016-02-17 ENCOUNTER — Other Ambulatory Visit: Payer: Self-pay | Admitting: Family Medicine

## 2016-02-17 ENCOUNTER — Telehealth: Payer: Self-pay | Admitting: Family Medicine

## 2016-02-17 ENCOUNTER — Ambulatory Visit
Admission: RE | Admit: 2016-02-17 | Discharge: 2016-02-17 | Disposition: A | Discharge status: Home / Self Care | Payer: Medicare Other | Source: Ambulatory Visit | Attending: Orthopedic Surgery | Admitting: Orthopedic Surgery

## 2016-02-17 ENCOUNTER — Other Ambulatory Visit: Payer: Self-pay | Admitting: *Deleted

## 2016-02-17 DIAGNOSIS — M48062 Spinal stenosis, lumbar region with neurogenic claudication: Secondary | ICD-10-CM

## 2016-02-17 DIAGNOSIS — M545 Low back pain: Secondary | ICD-10-CM | POA: Diagnosis not present

## 2016-02-17 MED ORDER — IOPAMIDOL (ISOVUE-M 200) INJECTION 41%
1.0000 mL | Freq: Once | INTRAMUSCULAR | Status: AC
  Administered 2016-02-17: 1 mL via EPIDURAL

## 2016-02-17 MED ORDER — METHYLPREDNISOLONE ACETATE 40 MG/ML INJ SUSP (RADIOLOG
120.0000 mg | Freq: Once | INTRAMUSCULAR | Status: AC
  Administered 2016-02-17: 120 mg via EPIDURAL

## 2016-02-17 MED ORDER — ESCITALOPRAM OXALATE 10 MG PO TABS: 10.0000 mg | ORAL_TABLET | Freq: Every day | ORAL | 5 refills | Status: DC

## 2016-02-17 MED ORDER — LACTULOSE 10 GM/15ML PO SOLN: ORAL | 0 refills | Status: DC

## 2016-02-17 NOTE — Discharge Instructions (Signed)

## 2016-02-17 NOTE — Telephone Encounter (Signed)
Pt is needing something called in for constipation due to his oxycodone. Pt also wants a refill on his lexapro.    [facility]

## 2016-02-17 NOTE — Telephone Encounter (Signed)
Refill lexapro, lactulose 16 oz one tblspoon qd prn constip

## 2016-02-17 NOTE — Telephone Encounter (Signed)
Left message notifying pt that both meds requested would be sent to pharm. meds sent

## 2016-02-25 DIAGNOSIS — J449 Chronic obstructive pulmonary disease, unspecified: Secondary | ICD-10-CM | POA: Diagnosis not present

## 2016-02-25 DIAGNOSIS — I872 Venous insufficiency (chronic) (peripheral): Secondary | ICD-10-CM | POA: Diagnosis not present

## 2016-02-25 DIAGNOSIS — I4891 Unspecified atrial fibrillation: Secondary | ICD-10-CM | POA: Diagnosis not present

## 2016-02-25 DIAGNOSIS — I509 Heart failure, unspecified: Secondary | ICD-10-CM | POA: Diagnosis not present

## 2016-02-25 DIAGNOSIS — L97512 Non-pressure chronic ulcer of other part of right foot with fat layer exposed: Secondary | ICD-10-CM | POA: Diagnosis not present

## 2016-02-25 DIAGNOSIS — I110 Hypertensive heart disease with heart failure: Secondary | ICD-10-CM | POA: Diagnosis not present

## 2016-02-26 ENCOUNTER — Ambulatory Visit (INDEPENDENT_AMBULATORY_CARE_PROVIDER_SITE_OTHER): Payer: Medicare Other | Admitting: Family Medicine

## 2016-02-26 ENCOUNTER — Encounter: Payer: Self-pay | Admitting: Family Medicine

## 2016-02-26 VITALS — BP 128/80 | Temp 97.8°F | Ht 69.0 in | Wt 192.0 lb

## 2016-02-26 DIAGNOSIS — J189 Pneumonia, unspecified organism: Secondary | ICD-10-CM | POA: Diagnosis not present

## 2016-02-26 DIAGNOSIS — J431 Panlobular emphysema: Secondary | ICD-10-CM

## 2016-02-26 DIAGNOSIS — I70223 Atherosclerosis of native arteries of extremities with rest pain, bilateral legs: Secondary | ICD-10-CM

## 2016-02-26 NOTE — Progress Notes (Signed)
   Subjective:    Patient ID: [name] [name], male    DOB: 12/31/1932, 82 y.o.   RN: [id number]  HPIFollow up on pneumonia. Pt states no fever or sob. Doing better.Patient notes less shortness of breath with exertion. His cough is calm down considerably. He took all the antibiotics up.  Should be noted that the x-ray performed immediately before the last one, radiologist recommended follow-up x-ray in a few weeks to "rule out malignancy". This is discussed with patient today.  Patient once again brings up numerous chronic problems in the context of his follow-up for his acute illness. He notes she's had hoarseness. Off-and-on for months. And even years. Often accompanied by an element of reflux. We had long discussion this. I reminded him that he had had upper endoscopy attempt a number of months ago by her local GI doctors. In addition he is referred on for even further assessment of esophagitis and that he declined to press on with further testing is recommended by the subspecialists. Which I think for now is reasonable considering the patient's situation  Had injections in back. Pain is better.   128 80     Review of Systems No headache, no major weight loss or weight gain, no chest pain no back pain abdominal pain no change in bowel habits complete ROS otherwise negative     Objective:   Physical Exam Alert vitals stable, NAD. Blood pressure good on repeat. HEENT normal. Lungs clear. Heart regular rate and rhythm. Ankles trace edema intermittent clearing of throat during exam       Assessment & Plan:  Impression clinical acquired pneumonia clinically resolved #2 radiological findings which need resolution discussed with patient #3 chronic reflux with secondary symptoms ongoing discussed plan 25 minutes spent with this nice but fragile both physically and mentally patient chest x-ray strongly encourage set up. May well need a scan after this

## 2016-02-28 DIAGNOSIS — I872 Venous insufficiency (chronic) (peripheral): Secondary | ICD-10-CM | POA: Diagnosis not present

## 2016-02-28 DIAGNOSIS — L97512 Non-pressure chronic ulcer of other part of right foot with fat layer exposed: Secondary | ICD-10-CM | POA: Diagnosis not present

## 2016-02-28 DIAGNOSIS — J449 Chronic obstructive pulmonary disease, unspecified: Secondary | ICD-10-CM | POA: Diagnosis not present

## 2016-02-28 DIAGNOSIS — Z7982 Long term (current) use of aspirin: Secondary | ICD-10-CM | POA: Diagnosis not present

## 2016-02-28 DIAGNOSIS — Z79891 Long term (current) use of opiate analgesic: Secondary | ICD-10-CM | POA: Diagnosis not present

## 2016-02-28 DIAGNOSIS — I4891 Unspecified atrial fibrillation: Secondary | ICD-10-CM | POA: Diagnosis not present

## 2016-02-28 DIAGNOSIS — I509 Heart failure, unspecified: Secondary | ICD-10-CM | POA: Diagnosis not present

## 2016-02-28 DIAGNOSIS — I110 Hypertensive heart disease with heart failure: Secondary | ICD-10-CM | POA: Diagnosis not present

## 2016-02-29 ENCOUNTER — Ambulatory Visit (HOSPITAL_COMMUNITY)
Admission: RE | Admit: 2016-02-29 | Discharge: 2016-02-29 | Disposition: A | Discharge status: Home / Self Care | Payer: Medicare Other | Source: Ambulatory Visit | Attending: Family Medicine | Admitting: Family Medicine

## 2016-02-29 DIAGNOSIS — R918 Other nonspecific abnormal finding of lung field: Secondary | ICD-10-CM | POA: Insufficient documentation

## 2016-02-29 DIAGNOSIS — Z09 Encounter for follow-up examination after completed treatment for conditions other than malignant neoplasm: Secondary | ICD-10-CM | POA: Insufficient documentation

## 2016-02-29 DIAGNOSIS — Z8701 Personal history of pneumonia (recurrent): Secondary | ICD-10-CM | POA: Insufficient documentation

## 2016-02-29 DIAGNOSIS — J189 Pneumonia, unspecified organism: Secondary | ICD-10-CM | POA: Diagnosis present

## 2016-02-29 DIAGNOSIS — R05 Cough: Secondary | ICD-10-CM | POA: Diagnosis not present

## 2016-03-01 DIAGNOSIS — L97519 Non-pressure chronic ulcer of other part of right foot with unspecified severity: Secondary | ICD-10-CM | POA: Diagnosis not present

## 2016-03-01 DIAGNOSIS — L97514 Non-pressure chronic ulcer of other part of right foot with necrosis of bone: Secondary | ICD-10-CM | POA: Diagnosis not present

## 2016-03-01 DIAGNOSIS — L97513 Non-pressure chronic ulcer of other part of right foot with necrosis of muscle: Secondary | ICD-10-CM | POA: Diagnosis not present

## 2016-03-01 DIAGNOSIS — L97512 Non-pressure chronic ulcer of other part of right foot with fat layer exposed: Secondary | ICD-10-CM | POA: Diagnosis not present

## 2016-03-01 DIAGNOSIS — I739 Peripheral vascular disease, unspecified: Secondary | ICD-10-CM | POA: Diagnosis not present

## 2016-03-02 DIAGNOSIS — I872 Venous insufficiency (chronic) (peripheral): Secondary | ICD-10-CM | POA: Diagnosis not present

## 2016-03-02 DIAGNOSIS — I4891 Unspecified atrial fibrillation: Secondary | ICD-10-CM | POA: Diagnosis not present

## 2016-03-02 DIAGNOSIS — J449 Chronic obstructive pulmonary disease, unspecified: Secondary | ICD-10-CM | POA: Diagnosis not present

## 2016-03-02 DIAGNOSIS — I110 Hypertensive heart disease with heart failure: Secondary | ICD-10-CM | POA: Diagnosis not present

## 2016-03-02 DIAGNOSIS — I509 Heart failure, unspecified: Secondary | ICD-10-CM | POA: Diagnosis not present

## 2016-03-02 DIAGNOSIS — L97512 Non-pressure chronic ulcer of other part of right foot with fat layer exposed: Secondary | ICD-10-CM | POA: Diagnosis not present

## 2016-03-08 DIAGNOSIS — I509 Heart failure, unspecified: Secondary | ICD-10-CM | POA: Diagnosis not present

## 2016-03-08 DIAGNOSIS — I110 Hypertensive heart disease with heart failure: Secondary | ICD-10-CM | POA: Diagnosis not present

## 2016-03-08 DIAGNOSIS — J449 Chronic obstructive pulmonary disease, unspecified: Secondary | ICD-10-CM | POA: Diagnosis not present

## 2016-03-08 DIAGNOSIS — I872 Venous insufficiency (chronic) (peripheral): Secondary | ICD-10-CM | POA: Diagnosis not present

## 2016-03-08 DIAGNOSIS — L97512 Non-pressure chronic ulcer of other part of right foot with fat layer exposed: Secondary | ICD-10-CM | POA: Diagnosis not present

## 2016-03-08 DIAGNOSIS — I4891 Unspecified atrial fibrillation: Secondary | ICD-10-CM | POA: Diagnosis not present

## 2016-03-14 DIAGNOSIS — I509 Heart failure, unspecified: Secondary | ICD-10-CM | POA: Diagnosis not present

## 2016-03-14 DIAGNOSIS — I4891 Unspecified atrial fibrillation: Secondary | ICD-10-CM | POA: Diagnosis not present

## 2016-03-14 DIAGNOSIS — I110 Hypertensive heart disease with heart failure: Secondary | ICD-10-CM | POA: Diagnosis not present

## 2016-03-14 DIAGNOSIS — J449 Chronic obstructive pulmonary disease, unspecified: Secondary | ICD-10-CM | POA: Diagnosis not present

## 2016-03-14 DIAGNOSIS — I872 Venous insufficiency (chronic) (peripheral): Secondary | ICD-10-CM | POA: Diagnosis not present

## 2016-03-14 DIAGNOSIS — L97512 Non-pressure chronic ulcer of other part of right foot with fat layer exposed: Secondary | ICD-10-CM | POA: Diagnosis not present

## 2016-03-18 ENCOUNTER — Ambulatory Visit (INDEPENDENT_AMBULATORY_CARE_PROVIDER_SITE_OTHER): Payer: Medicare Other | Admitting: Family Medicine

## 2016-03-18 ENCOUNTER — Encounter: Payer: Self-pay | Admitting: Family Medicine

## 2016-03-18 VITALS — BP 100/78 | Temp 98.0°F | Ht 69.0 in | Wt 197.2 lb

## 2016-03-18 DIAGNOSIS — L03115 Cellulitis of right lower limb: Secondary | ICD-10-CM

## 2016-03-18 DIAGNOSIS — L97512 Non-pressure chronic ulcer of other part of right foot with fat layer exposed: Secondary | ICD-10-CM | POA: Diagnosis not present

## 2016-03-18 DIAGNOSIS — I70223 Atherosclerosis of native arteries of extremities with rest pain, bilateral legs: Secondary | ICD-10-CM | POA: Diagnosis not present

## 2016-03-18 MED ORDER — CEPHALEXIN 500 MG PO CAPS: 500.0000 mg | ORAL_CAPSULE | Freq: Four times a day (QID) | ORAL | 0 refills | Status: DC

## 2016-03-18 NOTE — Progress Notes (Signed)
   Subjective:    Patient ID: [name] [name], male    DOB: 07/24/1932, 82 y.o.   MRN: [id number]  HPI Patient is here today for right foot infection. Pain and drainage noted. Onset 3 months ago. Treatments tried: antibiotic for pneumonia with no relief. Patient relates moderate amount of pain discomfort drainage states he seen wound care center but it does not appear to be healing denies high fever chills he relates a lot of severe pain when he walks on it has had previous x-ray earlier this year Patient has no other concerns at this time.    Review of Systems Patient with significant pain discomfort tenderness drainage denies any other particular troubles    Objective:   Physical Exam  Large foot ulcer approximately 1/2 inches in diameter on the top of the foot with associated cellulitis and with drainage appears to be down into the level of the muscle and bone rest of the ankle and lower leg has some redness swelling but no evidence of gangrene      Assessment & Plan:  Patient with large foot ulcer this is not been healing up despite treatment by the wound care center. Raises into question the possibility of osteomyelitis or circulation issues we will look at circulation closer plus also I spoke with radiologist who recommended MRI to rule out osteomyelitis. We will set up both of these tests patient will follow-up with wound care center and will also follow-up with primary care

## 2016-03-24 DIAGNOSIS — I872 Venous insufficiency (chronic) (peripheral): Secondary | ICD-10-CM | POA: Diagnosis not present

## 2016-03-24 DIAGNOSIS — I110 Hypertensive heart disease with heart failure: Secondary | ICD-10-CM | POA: Diagnosis not present

## 2016-03-24 DIAGNOSIS — L97512 Non-pressure chronic ulcer of other part of right foot with fat layer exposed: Secondary | ICD-10-CM | POA: Diagnosis not present

## 2016-03-24 DIAGNOSIS — J449 Chronic obstructive pulmonary disease, unspecified: Secondary | ICD-10-CM | POA: Diagnosis not present

## 2016-03-24 DIAGNOSIS — I4891 Unspecified atrial fibrillation: Secondary | ICD-10-CM | POA: Diagnosis not present

## 2016-03-24 DIAGNOSIS — I509 Heart failure, unspecified: Secondary | ICD-10-CM | POA: Diagnosis not present

## 2016-03-25 ENCOUNTER — Ambulatory Visit (HOSPITAL_COMMUNITY)
Admission: RE | Admit: 2016-03-25 | Discharge: 2016-03-25 | Disposition: A | Discharge status: Home / Self Care | Payer: Medicare Other | Source: Ambulatory Visit | Attending: Family Medicine | Admitting: Family Medicine

## 2016-03-25 DIAGNOSIS — L97512 Non-pressure chronic ulcer of other part of right foot with fat layer exposed: Secondary | ICD-10-CM | POA: Insufficient documentation

## 2016-03-25 DIAGNOSIS — R009 Unspecified abnormalities of heart beat: Secondary | ICD-10-CM | POA: Insufficient documentation

## 2016-03-25 DIAGNOSIS — M79671 Pain in right foot: Secondary | ICD-10-CM | POA: Diagnosis not present

## 2016-03-25 DIAGNOSIS — M25474 Effusion, right foot: Secondary | ICD-10-CM | POA: Insufficient documentation

## 2016-03-25 DIAGNOSIS — R600 Localized edema: Secondary | ICD-10-CM | POA: Diagnosis not present

## 2016-03-25 DIAGNOSIS — R938 Abnormal findings on diagnostic imaging of other specified body structures: Secondary | ICD-10-CM | POA: Insufficient documentation

## 2016-03-25 DIAGNOSIS — M7989 Other specified soft tissue disorders: Secondary | ICD-10-CM | POA: Diagnosis not present

## 2016-03-25 DIAGNOSIS — L97519 Non-pressure chronic ulcer of other part of right foot with unspecified severity: Secondary | ICD-10-CM | POA: Diagnosis not present

## 2016-03-29 DIAGNOSIS — I70235 Atherosclerosis of native arteries of right leg with ulceration of other part of foot: Secondary | ICD-10-CM | POA: Diagnosis not present

## 2016-03-29 DIAGNOSIS — I739 Peripheral vascular disease, unspecified: Secondary | ICD-10-CM | POA: Diagnosis not present

## 2016-03-29 DIAGNOSIS — L97513 Non-pressure chronic ulcer of other part of right foot with necrosis of muscle: Secondary | ICD-10-CM | POA: Diagnosis not present

## 2016-03-29 DIAGNOSIS — L97514 Non-pressure chronic ulcer of other part of right foot with necrosis of bone: Secondary | ICD-10-CM | POA: Diagnosis not present

## 2016-03-30 DIAGNOSIS — I4891 Unspecified atrial fibrillation: Secondary | ICD-10-CM | POA: Diagnosis not present

## 2016-03-30 DIAGNOSIS — J449 Chronic obstructive pulmonary disease, unspecified: Secondary | ICD-10-CM | POA: Diagnosis not present

## 2016-03-30 DIAGNOSIS — I509 Heart failure, unspecified: Secondary | ICD-10-CM | POA: Diagnosis not present

## 2016-03-30 DIAGNOSIS — I872 Venous insufficiency (chronic) (peripheral): Secondary | ICD-10-CM | POA: Diagnosis not present

## 2016-03-30 DIAGNOSIS — L97512 Non-pressure chronic ulcer of other part of right foot with fat layer exposed: Secondary | ICD-10-CM | POA: Diagnosis not present

## 2016-03-30 DIAGNOSIS — I110 Hypertensive heart disease with heart failure: Secondary | ICD-10-CM | POA: Diagnosis not present

## 2016-04-04 DIAGNOSIS — L97512 Non-pressure chronic ulcer of other part of right foot with fat layer exposed: Secondary | ICD-10-CM | POA: Diagnosis not present

## 2016-04-04 DIAGNOSIS — J449 Chronic obstructive pulmonary disease, unspecified: Secondary | ICD-10-CM | POA: Diagnosis not present

## 2016-04-04 DIAGNOSIS — I4891 Unspecified atrial fibrillation: Secondary | ICD-10-CM | POA: Diagnosis not present

## 2016-04-04 DIAGNOSIS — I110 Hypertensive heart disease with heart failure: Secondary | ICD-10-CM | POA: Diagnosis not present

## 2016-04-04 DIAGNOSIS — I509 Heart failure, unspecified: Secondary | ICD-10-CM | POA: Diagnosis not present

## 2016-04-04 DIAGNOSIS — I872 Venous insufficiency (chronic) (peripheral): Secondary | ICD-10-CM | POA: Diagnosis not present

## 2016-04-05 ENCOUNTER — Ambulatory Visit (INDEPENDENT_AMBULATORY_CARE_PROVIDER_SITE_OTHER): Payer: Medicare Other | Admitting: Family Medicine

## 2016-04-05 ENCOUNTER — Encounter: Payer: Self-pay | Admitting: Family Medicine

## 2016-04-05 VITALS — BP 102/66 | Ht 69.0 in | Wt 195.0 lb

## 2016-04-05 DIAGNOSIS — Z23 Encounter for immunization: Secondary | ICD-10-CM | POA: Diagnosis not present

## 2016-04-05 DIAGNOSIS — L97512 Non-pressure chronic ulcer of other part of right foot with fat layer exposed: Secondary | ICD-10-CM

## 2016-04-05 DIAGNOSIS — I70223 Atherosclerosis of native arteries of extremities with rest pain, bilateral legs: Secondary | ICD-10-CM

## 2016-04-05 MED ORDER — OXYCODONE-ACETAMINOPHEN 5-325 MG PO TABS: ORAL_TABLET | ORAL | 0 refills | Status: DC

## 2016-04-05 MED ORDER — CIPROFLOXACIN HCL 500 MG PO TABS: ORAL_TABLET | ORAL | 0 refills | Status: DC

## 2016-04-05 MED ORDER — OXYCODONE-ACETAMINOPHEN 5-325 MG PO TABS
ORAL_TABLET | ORAL | 0 refills | Status: DC
Start: 2016-04-05 — End: 2016-04-05

## 2016-04-05 NOTE — Progress Notes (Signed)
   Subjective:    Patient ID: [name] [name], male    DOB: 10/05/1932, 82 y.o.   MRN: [id number]  HPI Patient follows up primarily for primary care purposes. Please see prior note.  Patient came to us instead of his wound care specialist for the last visit. We did Doppler studies which revealed substantial arterial compromise to the involved leg. In addition, patient had an MRI which revealed involvement in the bony region of the foot. Dr. Lorin [name] at that time called the folks at the wound care center and let them know all of this. Patient is about done with Keflex.  Reports ongoing pain in the feet. Notes that to pain tablets per day is not enough to control foot pain. Requests more. [facility]  No fever no chills. tient in today for a 3 week follow up for ulcer to right foot.   States no other concerns this visit.  Review of Systems No headache, no major weight loss or weight gain, no chest pain no back pain abdominal pain no change in bowel habits complete ROS otherwise negative     Objective:   Physical Exam Alert vital stable afebrile  heart regular in rhythm no CA tenderness lungs slight diminished breath sounds at the bases, no wheezes no crackles right foot pulses diminished dorsal ulceration noted. Some surrounding erythema and tenderness.        Assessment & Plan:   impression deep stage IV wound with substantial arterial compromise and x-ray revealing potential bony involvement. Wound care center aware of this. Finishing antibiotic currently. Pain medicine suboptimum. Patient dealing with extreme forgetfulness "and at times mentation on the edge of dementia, comes in stating" I thought I'd have both you and Dr. Tanda [name] care from my ulcer". I advised patient he really needs the specialist managing this. Plan add Cipro 500 twice a day for 21 days. Increase pain tablets of 4 times per day. I am very concerned about this patient's foot. I  told him I felt there was a probability that he was going to eventually lose this foot to amputation. Patient states that he's been told pretty much the same thing by his wound care specialist. I strongly feel he needs to follow-up closely with his wound care specialist. There needs to be 1 captain of the ship in regards to his foot and I have advised patient it should be the specialist. Follow-up as scheduled. I will drop note and Dr. Tanda [name] direction to reinforce this. WSL

## 2016-04-06 ENCOUNTER — Ambulatory Visit: Payer: Medicare Other | Admitting: Family Medicine

## 2016-04-12 DIAGNOSIS — L97514 Non-pressure chronic ulcer of other part of right foot with necrosis of bone: Secondary | ICD-10-CM | POA: Diagnosis not present

## 2016-04-12 DIAGNOSIS — I739 Peripheral vascular disease, unspecified: Secondary | ICD-10-CM | POA: Diagnosis not present

## 2016-04-12 DIAGNOSIS — I70261 Atherosclerosis of native arteries of extremities with gangrene, right leg: Secondary | ICD-10-CM | POA: Diagnosis not present

## 2016-04-14 DIAGNOSIS — I110 Hypertensive heart disease with heart failure: Secondary | ICD-10-CM | POA: Diagnosis not present

## 2016-04-14 DIAGNOSIS — I4891 Unspecified atrial fibrillation: Secondary | ICD-10-CM | POA: Diagnosis not present

## 2016-04-14 DIAGNOSIS — I509 Heart failure, unspecified: Secondary | ICD-10-CM | POA: Diagnosis not present

## 2016-04-14 DIAGNOSIS — I872 Venous insufficiency (chronic) (peripheral): Secondary | ICD-10-CM | POA: Diagnosis not present

## 2016-04-14 DIAGNOSIS — J449 Chronic obstructive pulmonary disease, unspecified: Secondary | ICD-10-CM | POA: Diagnosis not present

## 2016-04-14 DIAGNOSIS — L97512 Non-pressure chronic ulcer of other part of right foot with fat layer exposed: Secondary | ICD-10-CM | POA: Diagnosis not present

## 2016-04-21 DIAGNOSIS — I110 Hypertensive heart disease with heart failure: Secondary | ICD-10-CM | POA: Diagnosis not present

## 2016-04-21 DIAGNOSIS — I872 Venous insufficiency (chronic) (peripheral): Secondary | ICD-10-CM | POA: Diagnosis not present

## 2016-04-21 DIAGNOSIS — L97512 Non-pressure chronic ulcer of other part of right foot with fat layer exposed: Secondary | ICD-10-CM | POA: Diagnosis not present

## 2016-04-21 DIAGNOSIS — I4891 Unspecified atrial fibrillation: Secondary | ICD-10-CM | POA: Diagnosis not present

## 2016-04-21 DIAGNOSIS — J449 Chronic obstructive pulmonary disease, unspecified: Secondary | ICD-10-CM | POA: Diagnosis not present

## 2016-04-21 DIAGNOSIS — I509 Heart failure, unspecified: Secondary | ICD-10-CM | POA: Diagnosis not present

## 2016-04-25 DIAGNOSIS — L97512 Non-pressure chronic ulcer of other part of right foot with fat layer exposed: Secondary | ICD-10-CM | POA: Diagnosis not present

## 2016-04-25 DIAGNOSIS — I872 Venous insufficiency (chronic) (peripheral): Secondary | ICD-10-CM | POA: Diagnosis not present

## 2016-04-25 DIAGNOSIS — I509 Heart failure, unspecified: Secondary | ICD-10-CM | POA: Diagnosis not present

## 2016-04-25 DIAGNOSIS — I110 Hypertensive heart disease with heart failure: Secondary | ICD-10-CM | POA: Diagnosis not present

## 2016-04-25 DIAGNOSIS — I4891 Unspecified atrial fibrillation: Secondary | ICD-10-CM | POA: Diagnosis not present

## 2016-04-25 DIAGNOSIS — J449 Chronic obstructive pulmonary disease, unspecified: Secondary | ICD-10-CM | POA: Diagnosis not present

## 2016-04-27 ENCOUNTER — Encounter (HOSPITAL_BASED_OUTPATIENT_CLINIC_OR_DEPARTMENT_OTHER): Payer: Medicare Other | Attending: Surgery

## 2016-04-27 DIAGNOSIS — I70235 Atherosclerosis of native arteries of right leg with ulceration of other part of foot: Secondary | ICD-10-CM | POA: Diagnosis not present

## 2016-04-27 DIAGNOSIS — Z79899 Other long term (current) drug therapy: Secondary | ICD-10-CM | POA: Insufficient documentation

## 2016-04-27 DIAGNOSIS — F17290 Nicotine dependence, other tobacco product, uncomplicated: Secondary | ICD-10-CM | POA: Diagnosis not present

## 2016-04-27 DIAGNOSIS — Z96641 Presence of right artificial hip joint: Secondary | ICD-10-CM | POA: Insufficient documentation

## 2016-04-27 DIAGNOSIS — Z7982 Long term (current) use of aspirin: Secondary | ICD-10-CM | POA: Insufficient documentation

## 2016-04-27 DIAGNOSIS — M86371 Chronic multifocal osteomyelitis, right ankle and foot: Secondary | ICD-10-CM | POA: Diagnosis not present

## 2016-04-27 DIAGNOSIS — I130 Hypertensive heart and chronic kidney disease with heart failure and stage 1 through stage 4 chronic kidney disease, or unspecified chronic kidney disease: Secondary | ICD-10-CM | POA: Insufficient documentation

## 2016-04-27 DIAGNOSIS — I482 Chronic atrial fibrillation: Secondary | ICD-10-CM | POA: Diagnosis not present

## 2016-04-27 DIAGNOSIS — L97411 Non-pressure chronic ulcer of right heel and midfoot limited to breakdown of skin: Secondary | ICD-10-CM | POA: Insufficient documentation

## 2016-04-27 DIAGNOSIS — L959 Vasculitis limited to the skin, unspecified: Secondary | ICD-10-CM | POA: Diagnosis not present

## 2016-04-27 DIAGNOSIS — J449 Chronic obstructive pulmonary disease, unspecified: Secondary | ICD-10-CM | POA: Diagnosis not present

## 2016-04-27 DIAGNOSIS — K219 Gastro-esophageal reflux disease without esophagitis: Secondary | ICD-10-CM | POA: Insufficient documentation

## 2016-04-27 DIAGNOSIS — L97518 Non-pressure chronic ulcer of other part of right foot with other specified severity: Secondary | ICD-10-CM | POA: Diagnosis not present

## 2016-04-27 DIAGNOSIS — Z791 Long term (current) use of non-steroidal anti-inflammatories (NSAID): Secondary | ICD-10-CM | POA: Insufficient documentation

## 2016-04-27 DIAGNOSIS — N189 Chronic kidney disease, unspecified: Secondary | ICD-10-CM | POA: Diagnosis not present

## 2016-04-27 DIAGNOSIS — L97514 Non-pressure chronic ulcer of other part of right foot with necrosis of bone: Secondary | ICD-10-CM | POA: Diagnosis not present

## 2016-04-27 DIAGNOSIS — L97516 Non-pressure chronic ulcer of other part of right foot with bone involvement without evidence of necrosis: Secondary | ICD-10-CM | POA: Insufficient documentation

## 2016-04-27 DIAGNOSIS — I509 Heart failure, unspecified: Secondary | ICD-10-CM | POA: Insufficient documentation

## 2016-04-28 ENCOUNTER — Other Ambulatory Visit: Payer: Self-pay

## 2016-04-28 DIAGNOSIS — I4891 Unspecified atrial fibrillation: Secondary | ICD-10-CM | POA: Diagnosis not present

## 2016-04-28 DIAGNOSIS — I509 Heart failure, unspecified: Secondary | ICD-10-CM | POA: Diagnosis not present

## 2016-04-28 DIAGNOSIS — I110 Hypertensive heart disease with heart failure: Secondary | ICD-10-CM | POA: Diagnosis not present

## 2016-04-28 DIAGNOSIS — L97512 Non-pressure chronic ulcer of other part of right foot with fat layer exposed: Secondary | ICD-10-CM | POA: Diagnosis not present

## 2016-04-28 DIAGNOSIS — I7092 Chronic total occlusion of artery of the extremities: Secondary | ICD-10-CM

## 2016-04-28 DIAGNOSIS — I872 Venous insufficiency (chronic) (peripheral): Secondary | ICD-10-CM | POA: Diagnosis not present

## 2016-04-28 DIAGNOSIS — J449 Chronic obstructive pulmonary disease, unspecified: Secondary | ICD-10-CM | POA: Diagnosis not present

## 2016-05-02 DIAGNOSIS — L97512 Non-pressure chronic ulcer of other part of right foot with fat layer exposed: Secondary | ICD-10-CM | POA: Diagnosis not present

## 2016-05-02 DIAGNOSIS — I4891 Unspecified atrial fibrillation: Secondary | ICD-10-CM | POA: Diagnosis not present

## 2016-05-02 DIAGNOSIS — I509 Heart failure, unspecified: Secondary | ICD-10-CM | POA: Diagnosis not present

## 2016-05-02 DIAGNOSIS — I110 Hypertensive heart disease with heart failure: Secondary | ICD-10-CM | POA: Diagnosis not present

## 2016-05-02 DIAGNOSIS — J449 Chronic obstructive pulmonary disease, unspecified: Secondary | ICD-10-CM | POA: Diagnosis not present

## 2016-05-02 DIAGNOSIS — I872 Venous insufficiency (chronic) (peripheral): Secondary | ICD-10-CM | POA: Diagnosis not present

## 2016-05-05 ENCOUNTER — Encounter: Payer: Medicare Other | Admitting: Vascular Surgery

## 2016-05-10 DIAGNOSIS — I999 Unspecified disorder of circulatory system: Secondary | ICD-10-CM | POA: Diagnosis not present

## 2016-05-10 DIAGNOSIS — I70261 Atherosclerosis of native arteries of extremities with gangrene, right leg: Secondary | ICD-10-CM | POA: Diagnosis not present

## 2016-05-10 DIAGNOSIS — L97514 Non-pressure chronic ulcer of other part of right foot with necrosis of bone: Secondary | ICD-10-CM | POA: Diagnosis not present

## 2016-05-10 DIAGNOSIS — L97519 Non-pressure chronic ulcer of other part of right foot with unspecified severity: Secondary | ICD-10-CM | POA: Diagnosis not present

## 2016-05-10 DIAGNOSIS — L97518 Non-pressure chronic ulcer of other part of right foot with other specified severity: Secondary | ICD-10-CM | POA: Diagnosis not present

## 2016-05-20 DIAGNOSIS — I4891 Unspecified atrial fibrillation: Secondary | ICD-10-CM | POA: Diagnosis not present

## 2016-05-20 DIAGNOSIS — J449 Chronic obstructive pulmonary disease, unspecified: Secondary | ICD-10-CM | POA: Diagnosis not present

## 2016-05-20 DIAGNOSIS — I509 Heart failure, unspecified: Secondary | ICD-10-CM | POA: Diagnosis not present

## 2016-05-20 DIAGNOSIS — L97512 Non-pressure chronic ulcer of other part of right foot with fat layer exposed: Secondary | ICD-10-CM | POA: Diagnosis not present

## 2016-05-20 DIAGNOSIS — I110 Hypertensive heart disease with heart failure: Secondary | ICD-10-CM | POA: Diagnosis not present

## 2016-05-20 DIAGNOSIS — I872 Venous insufficiency (chronic) (peripheral): Secondary | ICD-10-CM | POA: Diagnosis not present

## 2016-05-24 DIAGNOSIS — I4891 Unspecified atrial fibrillation: Secondary | ICD-10-CM | POA: Diagnosis not present

## 2016-05-24 DIAGNOSIS — J449 Chronic obstructive pulmonary disease, unspecified: Secondary | ICD-10-CM | POA: Diagnosis not present

## 2016-05-24 DIAGNOSIS — I110 Hypertensive heart disease with heart failure: Secondary | ICD-10-CM | POA: Diagnosis not present

## 2016-05-24 DIAGNOSIS — I509 Heart failure, unspecified: Secondary | ICD-10-CM | POA: Diagnosis not present

## 2016-05-24 DIAGNOSIS — L97512 Non-pressure chronic ulcer of other part of right foot with fat layer exposed: Secondary | ICD-10-CM | POA: Diagnosis not present

## 2016-05-24 DIAGNOSIS — I872 Venous insufficiency (chronic) (peripheral): Secondary | ICD-10-CM | POA: Diagnosis not present

## 2016-05-30 DIAGNOSIS — I509 Heart failure, unspecified: Secondary | ICD-10-CM | POA: Diagnosis not present

## 2016-05-30 DIAGNOSIS — I4891 Unspecified atrial fibrillation: Secondary | ICD-10-CM | POA: Diagnosis not present

## 2016-05-30 DIAGNOSIS — I110 Hypertensive heart disease with heart failure: Secondary | ICD-10-CM | POA: Diagnosis not present

## 2016-05-30 DIAGNOSIS — L97512 Non-pressure chronic ulcer of other part of right foot with fat layer exposed: Secondary | ICD-10-CM | POA: Diagnosis not present

## 2016-05-30 DIAGNOSIS — J449 Chronic obstructive pulmonary disease, unspecified: Secondary | ICD-10-CM | POA: Diagnosis not present

## 2016-05-30 DIAGNOSIS — I872 Venous insufficiency (chronic) (peripheral): Secondary | ICD-10-CM | POA: Diagnosis not present

## 2016-06-01 ENCOUNTER — Encounter: Payer: Self-pay | Admitting: Vascular Surgery

## 2016-06-09 ENCOUNTER — Ambulatory Visit (INDEPENDENT_AMBULATORY_CARE_PROVIDER_SITE_OTHER): Payer: Medicare Other | Admitting: Vascular Surgery

## 2016-06-09 ENCOUNTER — Encounter: Payer: Self-pay | Admitting: Vascular Surgery

## 2016-06-09 ENCOUNTER — Ambulatory Visit (HOSPITAL_COMMUNITY)
Admission: RE | Admit: 2016-06-09 | Discharge: 2016-06-09 | Disposition: A | Discharge status: Home / Self Care | Payer: Medicare Other | Source: Ambulatory Visit | Attending: Vascular Surgery | Admitting: Vascular Surgery

## 2016-06-09 VITALS — BP 133/71 | HR 71 | Temp 97.6°F | Resp 18 | Ht 69.0 in | Wt 195.2 lb

## 2016-06-09 DIAGNOSIS — I7092 Chronic total occlusion of artery of the extremities: Secondary | ICD-10-CM

## 2016-06-09 DIAGNOSIS — I70223 Atherosclerosis of native arteries of extremities with rest pain, bilateral legs: Secondary | ICD-10-CM | POA: Diagnosis not present

## 2016-06-09 LAB — VAS US LOWER EXTREMITY ARTERIAL DUPLEX
Right peroneal sys PSV: 32 cm/s
Right super femoral dist sys PSV: -31 cm/s
Right super femoral mid sys PSV: -86 cm/s
Right super femoral prox sys PSV: -87 cm/s

## 2016-06-09 NOTE — Progress Notes (Signed)
Patient name: [name] [name] MRN: [id number] DOB: 06/11/1933 Sex: male  REASON FOR CONSULT: Severe peripheral vascular disease. Evaluate for right below the knee amputation. Referred by Dr. Meyer RusselBritto.  HPI: [name] [name] is a 82 y.o. male, who was initially seen in consultation by Dr. Imogene Burnhen with bilateral lower extremity pain. He was set up for an arteriogram which was performed in April of this year which showed no significant disease down to the level of the popliteal arteries bilaterally. He had bilateral popliteal artery occlusions with severe tibial artery occlusive disease. He was not felt to be a candidate for revascularization.  He was referred back to our office for evaluation for possible right below the knee amputation. The wound has not shown any significant improvement in the right foot. He has rest pain in the right foot. This is aggravated by elevation and relieved somewhat with dependency. He denies fever or chills. He has had significant serous drainage from the wound on the dorsum of his foot.  Past Medical History:  Diagnosis Date  . Alcohol abuse    stop drinking since 01/2009  . Arthritis   . Asthma   . Atrial fibrillation (HCC)    off coumadin secondary to fall and subdural  hematoma   . B12 deficiency 9/10   started injection   . BPH (benign prostatic hyperplasia)   [facility] CANCER CELLS TX 2 MO AGO  . Carcinoma in situ of bladder   . Chronic kidney disease    RECENT UTI FEW MONTHS AGO TX WITH ANTIBIOTIC  . Compression fracture   . COPD (chronic obstructive pulmonary disease) (HCC)   . Diverticula of colon    few small scattered in the sigmoid colon  . Enlarged prostate   . External hemorrhoids 09/2014  . GERD (gastroesophageal reflux disease)   . H/O hiatal hernia   . Headache(784.0)   . Hypertension   . Nutcracker esophagus 2002   on EM  . Osteoporosis   . Peripheral edema   . Reflux   . Venous stasis    chronic     Family History    Problem Relation Age of Onset  . Cancer Brother   . Hypertension Mother   . Diabetes Mother   . Heart attack Mother   . Heart disease Mother     SOCIAL HISTORY: Social History   Social History  . Marital status: Married    Spouse name: N/A  . Number of children: 2  . Years of education: N/A   Occupational History  . Not on file.   Social History Main Topics  . Smoking status: Never Smoker  . Smokeless tobacco: Former NeurosurgeonUser    Types: Chew    Quit date: 06/28/1991  . Alcohol use No     Comment: history of alcohol abuse stop drinking 8/10 . Alcohol abuse for about 25 years   . Drug use: No  . Sexual activity: Not on file   Other Topics Concern  . Not on file   Social History Narrative  . No narrative on file    Allergies  Allergen Reactions  . Oxycodone-Acetaminophen Itching  . Sulfonamide Derivatives Hives  . Tramadol Itching    Current Outpatient Prescriptions  Medication Sig Dispense Refill  . aspirin 325 MG tablet Take 325 mg by mouth daily.    . furosemide (LASIX) 40 MG tablet Take 2 tablets every morning (Patient taking differently: Take 80 mg by mouth  daily as needed for fluid. ) 30 tablet 5  . naproxen sodium (ANAPROX) 220 MG tablet Take 440 mg by mouth 2 (two) times daily with a meal.     . oxyCODONE-acetaminophen (ROXICET) 5-325 MG tablet Take 1 tablet by mouth up to four times a day as needed for pain. 120 tablet 0  . verapamil (CALAN-SR) 120 MG CR tablet Take 1 tablet (120 mg total) by mouth daily. 30 tablet 5  . alendronate (FOSAMAX) 70 MG tablet Take 1 tablet (70 mg total) by mouth every 7 (seven) days. Take with a full glass of water on an empty stomach. (Patient not taking: Reported on 06/09/2016) 4 tablet 11  . CALCIUM PO Take 1 tablet by mouth daily.    . cephALEXin (KEFLEX) 500 MG capsule Take 1 capsule (500 mg total) by mouth 4 (four) times daily. X 3 weeks (Patient not taking: Reported on 06/09/2016) 84 capsule 0  . Cholecalciferol (VITAMIN D PO)  Take 1 capsule by mouth daily.    . ciprofloxacin (CIPRO) 500 MG tablet Take 1 tablet by mouth twice a day for 21 days. (Patient not taking: Reported on 06/09/2016) 42 tablet 0  . DOCOSAHEXAENOIC ACID PO Take 1 g by mouth.    . escitalopram (LEXAPRO) 10 MG tablet Take 1 tablet (10 mg total) by mouth daily. (Patient not taking: Reported on 06/09/2016) 30 tablet 5  . lactulose (CHRONULAC) 10 GM/15ML solution Take one tablespoon qd prn constipation (Patient not taking: Reported on 06/09/2016) 240 mL 0  . pantoprazole (PROTONIX) 40 MG tablet Take 1 tablet (40 mg total) by mouth daily. (Patient not taking: Reported on 06/09/2016) 90 tablet 3  . sucralfate (CARAFATE) 1 g tablet DISSOLVE 1 TABLET IN 2 OZ WATER AND DRINK BEFORE MEALS AND AT BEDTIMEFOR 2 WEEKS. (Patient not taking: Reported on 06/09/2016) 60 tablet 3   No current facility-administered medications for this visit.     REVIEW OF SYSTEMS:  [X]  denotes positive finding, [ ]  denotes negative finding Cardiac  Comments:  Chest pain or chest pressure:    Shortness of breath upon exertion: X   Short of breath when lying flat:    Irregular heart rhythm: X       Vascular    Pain in calf, thigh, or hip brought on by ambulation:    Pain in feet at night that wakes you up from your sleep:  X   Blood clot in your veins:    Leg swelling:  X       Pulmonary    Oxygen at home:    Productive cough:     Wheezing:         Neurologic    Sudden weakness in arms or legs:     Sudden numbness in arms or legs:     Sudden onset of difficulty speaking or slurred speech:    Temporary loss of vision in one eye:     Problems with dizziness:         Gastrointestinal    Blood in stool:     Vomited blood:         Genitourinary    Burning when urinating:     Blood in urine:        Psychiatric    Major depression:         Hematologic    Bleeding problems:    Problems with blood clotting too easily:        Skin    Rashes or ulcers:  Constitutional    Fever or chills:      PHYSICAL EXAM: Vitals:   06/09/16 1351 06/09/16 1352  BP: (!) 155/74 133/71  Pulse: 71   Resp: 18   Temp: 97.6 F (36.4 C)   TempSrc: Oral   SpO2: 100%   Weight: 195 lb 3.2 oz (88.5 kg)   Height: 5\' 9"  (1.753 m)     GENERAL: The patient is a well-nourished male, in no acute distress. The vital signs are documented above. CARDIAC: There is a regular rate and rhythm.  VASCULAR: he has a palpable right femoral pulse. I cannot palpate a popliteal pulse on the right. He has a dampened monophasic peroneal signal on the right. On the left side he has a palpable femoral and popliteal pulse. I cannot palpate pedal pulses. He has significant bilateral lower extremity swelling. PULMONARY: There is good air exchange bilaterally without wheezing or rales. ABDOMEN: Soft and non-tender with normal pitched bowel sounds.  MUSCULOSKELETAL: There are no major deformities or cyanosis. NEUROLOGIC: No focal weakness or paresthesias are detected. SKIN: he has a large wound on the dorsum of his right foot that measures a proximally 5 cm in diameter. In addition there is a wound on the plantar aspect of the foot adjacent to the fifth metatarsal. PSYCHIATRIC: The patient has a normal affect.  DATA:   AORTOGRAM: I have reviewed the aortogram that was done by Dr. Leonette Mostharles fields on 10/23/2015. This showed bilateral popliteal artery occlusions with unreconstructable tibial disease bilaterally.  RIGHT LOWER EXTREMITY ARTERIAL DUPLEX: I have independently interpreted the right lower extremity arterial duplex scan today. This shows that the popliteal artery is occluded. There is good triphasic flow down to the distal superficial femoral artery.  MEDICAL ISSUES:  NONHEALING WOUNDS RIGHT FOOT WITH UNRECONSTRUCTABLE TIBIAL ARTERY OCCLUSIVE DISEASE: I would agree with Dr. Darrick PennaFields assessment that he is not a candidate for revascularization. I think the only option is primary  right below-knee amputation. I discussed this with the patient and his son today and currently he is not agreeable to proceed. I offered to arrange a follow up visit with Dr. Imogene Burnhen. He will call if he decides to be evaluated or to schedule surgery. In the meantime he will continue aggressive wound care.  Waverly Ferrariickson, Margaretha Mahan [facility] [facility] [phone]

## 2016-06-10 DIAGNOSIS — J449 Chronic obstructive pulmonary disease, unspecified: Secondary | ICD-10-CM | POA: Diagnosis not present

## 2016-06-10 DIAGNOSIS — I509 Heart failure, unspecified: Secondary | ICD-10-CM | POA: Diagnosis not present

## 2016-06-10 DIAGNOSIS — I4891 Unspecified atrial fibrillation: Secondary | ICD-10-CM | POA: Diagnosis not present

## 2016-06-10 DIAGNOSIS — I110 Hypertensive heart disease with heart failure: Secondary | ICD-10-CM | POA: Diagnosis not present

## 2016-06-10 DIAGNOSIS — L97512 Non-pressure chronic ulcer of other part of right foot with fat layer exposed: Secondary | ICD-10-CM | POA: Diagnosis not present

## 2016-06-10 DIAGNOSIS — I872 Venous insufficiency (chronic) (peripheral): Secondary | ICD-10-CM | POA: Diagnosis not present

## 2016-06-15 DIAGNOSIS — I110 Hypertensive heart disease with heart failure: Secondary | ICD-10-CM | POA: Diagnosis not present

## 2016-06-15 DIAGNOSIS — I4891 Unspecified atrial fibrillation: Secondary | ICD-10-CM | POA: Diagnosis not present

## 2016-06-15 DIAGNOSIS — J449 Chronic obstructive pulmonary disease, unspecified: Secondary | ICD-10-CM | POA: Diagnosis not present

## 2016-06-15 DIAGNOSIS — I872 Venous insufficiency (chronic) (peripheral): Secondary | ICD-10-CM | POA: Diagnosis not present

## 2016-06-15 DIAGNOSIS — I509 Heart failure, unspecified: Secondary | ICD-10-CM | POA: Diagnosis not present

## 2016-06-15 DIAGNOSIS — L97512 Non-pressure chronic ulcer of other part of right foot with fat layer exposed: Secondary | ICD-10-CM | POA: Diagnosis not present

## 2016-06-22 ENCOUNTER — Telehealth: Payer: Self-pay | Admitting: Family Medicine

## 2016-06-22 DIAGNOSIS — I4891 Unspecified atrial fibrillation: Secondary | ICD-10-CM | POA: Diagnosis not present

## 2016-06-22 DIAGNOSIS — L97512 Non-pressure chronic ulcer of other part of right foot with fat layer exposed: Secondary | ICD-10-CM | POA: Diagnosis not present

## 2016-06-22 DIAGNOSIS — J449 Chronic obstructive pulmonary disease, unspecified: Secondary | ICD-10-CM | POA: Diagnosis not present

## 2016-06-22 DIAGNOSIS — I872 Venous insufficiency (chronic) (peripheral): Secondary | ICD-10-CM | POA: Diagnosis not present

## 2016-06-22 DIAGNOSIS — I509 Heart failure, unspecified: Secondary | ICD-10-CM | POA: Diagnosis not present

## 2016-06-22 DIAGNOSIS — I110 Hypertensive heart disease with heart failure: Secondary | ICD-10-CM | POA: Diagnosis not present

## 2016-06-22 NOTE — Telephone Encounter (Signed)
Home health called stating that he fell a couple days ago hitting his face. Pt has a pretty big gash above his left eye. Pt states that he did not lose consciousness that he is only sore all over. Home health is doing wound care on his foot and is needing verbal authorization on plan of care for nursing and physical therapy.

## 2016-06-22 NOTE — Telephone Encounter (Signed)
ok 

## 2016-06-22 NOTE — Telephone Encounter (Signed)
Notified Elizabeth at home health that Dr. Brett CanalesSteve authorizes the plan of care for nursing and therapy-verbalized understanding.

## 2016-06-27 DIAGNOSIS — J449 Chronic obstructive pulmonary disease, unspecified: Secondary | ICD-10-CM | POA: Diagnosis not present

## 2016-06-27 DIAGNOSIS — L97512 Non-pressure chronic ulcer of other part of right foot with fat layer exposed: Secondary | ICD-10-CM | POA: Diagnosis not present

## 2016-06-27 DIAGNOSIS — I509 Heart failure, unspecified: Secondary | ICD-10-CM | POA: Diagnosis not present

## 2016-06-27 DIAGNOSIS — I110 Hypertensive heart disease with heart failure: Secondary | ICD-10-CM | POA: Diagnosis not present

## 2016-06-27 DIAGNOSIS — I872 Venous insufficiency (chronic) (peripheral): Secondary | ICD-10-CM | POA: Diagnosis not present

## 2016-06-27 DIAGNOSIS — I4891 Unspecified atrial fibrillation: Secondary | ICD-10-CM | POA: Diagnosis not present

## 2016-06-28 DIAGNOSIS — J449 Chronic obstructive pulmonary disease, unspecified: Secondary | ICD-10-CM | POA: Diagnosis not present

## 2016-06-28 DIAGNOSIS — I110 Hypertensive heart disease with heart failure: Secondary | ICD-10-CM | POA: Diagnosis not present

## 2016-06-28 DIAGNOSIS — I872 Venous insufficiency (chronic) (peripheral): Secondary | ICD-10-CM | POA: Diagnosis not present

## 2016-06-28 DIAGNOSIS — I509 Heart failure, unspecified: Secondary | ICD-10-CM | POA: Diagnosis not present

## 2016-06-28 DIAGNOSIS — L97512 Non-pressure chronic ulcer of other part of right foot with fat layer exposed: Secondary | ICD-10-CM | POA: Diagnosis not present

## 2016-06-28 DIAGNOSIS — I4891 Unspecified atrial fibrillation: Secondary | ICD-10-CM | POA: Diagnosis not present

## 2016-06-29 ENCOUNTER — Encounter: Payer: Self-pay | Admitting: Family Medicine

## 2016-06-29 ENCOUNTER — Ambulatory Visit (INDEPENDENT_AMBULATORY_CARE_PROVIDER_SITE_OTHER): Payer: Medicare Other | Admitting: Family Medicine

## 2016-06-29 VITALS — BP 138/76 | Ht 69.0 in | Wt 199.8 lb

## 2016-06-29 DIAGNOSIS — G894 Chronic pain syndrome: Secondary | ICD-10-CM | POA: Diagnosis not present

## 2016-06-29 DIAGNOSIS — I10 Essential (primary) hypertension: Secondary | ICD-10-CM | POA: Diagnosis not present

## 2016-06-29 DIAGNOSIS — S0083XA Contusion of other part of head, initial encounter: Secondary | ICD-10-CM

## 2016-06-29 MED ORDER — OXYCODONE-ACETAMINOPHEN 7.5-325 MG PO TABS: 1.0000 | ORAL_TABLET | ORAL | 0 refills | Status: DC | PRN

## 2016-06-29 MED ORDER — VERAPAMIL HCL ER 180 MG PO TBCR: 180.0000 mg | EXTENDED_RELEASE_TABLET | Freq: Every day | ORAL | 5 refills | Status: DC

## 2016-06-29 NOTE — Progress Notes (Signed)
   Subjective:    Patient ID: [name] [name], male    DOB: 01/24/1933, 83 y.o.   MRN: [id number] Patient arrives office with several concerns HPI This patient was seen today for chronic pain. Takes for Chronic back pain and foot pain and leg pain.   The medication list was reviewed and updated.   -Compliance with medication: yes  - Number patient states they take daily: four a day  -when was the last dose patient took? This am  The patient was advised the importance of maintaining medication and not using illegal substances with these.  Refills needed: yes  The patient was educated that we can provide 3 monthly scripts for their medication, it is their responsibility to follow the instructions.  Side effects or complications from medications: itching  Patient is aware that pain medications are meant to minimize the severity of the pain to allow their pain levels to improve to allow for better function. They are aware of that pain medications cannot totally remove their pain.  Due for UDT ( at least once per year) :   Larey SeatFell Dec 24th. Dizziness when he stood and leaned over too far, lost balance and hit the floor. Bruising on face. He still having some residual discomfort and tenderness.   Pt wants to go back to stronger dose on blood pressure med. Blood pressures often elevated. Patient claims compliance. No obvious side effects.      Review of Systems No headache, no major weight loss or weight gain, no chest pain no back pain abdominal pain no change in bowel habits complete ROS otherwise negative     Objective:   Physical Exam Alert vitals stable, NAD. Blood pressure Still elevated on repeat. HEENT left facial hematoma resolving left eyebrow laceration healing otherwise normal. Lungs clear. Heart regular rate and rhythm. Low back positive tenderness to palpation.        Assessment & Plan:  Impression 1 hypertension suboptimal control meds discussed increase #2 chronic back  pain ongoing abdominal pain in the legs discuss patient states he needs to 4 times a day pain medicine and at a higher dose #3 facial hematoma precaution discussed. No need for x-rays Diet exercise discussed. Pain medicines written

## 2016-06-30 DIAGNOSIS — J449 Chronic obstructive pulmonary disease, unspecified: Secondary | ICD-10-CM | POA: Diagnosis not present

## 2016-06-30 DIAGNOSIS — L97512 Non-pressure chronic ulcer of other part of right foot with fat layer exposed: Secondary | ICD-10-CM | POA: Diagnosis not present

## 2016-06-30 DIAGNOSIS — I110 Hypertensive heart disease with heart failure: Secondary | ICD-10-CM | POA: Diagnosis not present

## 2016-06-30 DIAGNOSIS — I509 Heart failure, unspecified: Secondary | ICD-10-CM | POA: Diagnosis not present

## 2016-06-30 DIAGNOSIS — I4891 Unspecified atrial fibrillation: Secondary | ICD-10-CM | POA: Diagnosis not present

## 2016-06-30 DIAGNOSIS — I872 Venous insufficiency (chronic) (peripheral): Secondary | ICD-10-CM | POA: Diagnosis not present

## 2016-07-01 DIAGNOSIS — I110 Hypertensive heart disease with heart failure: Secondary | ICD-10-CM | POA: Diagnosis not present

## 2016-07-01 DIAGNOSIS — I4891 Unspecified atrial fibrillation: Secondary | ICD-10-CM | POA: Diagnosis not present

## 2016-07-01 DIAGNOSIS — I872 Venous insufficiency (chronic) (peripheral): Secondary | ICD-10-CM | POA: Diagnosis not present

## 2016-07-01 DIAGNOSIS — L97512 Non-pressure chronic ulcer of other part of right foot with fat layer exposed: Secondary | ICD-10-CM | POA: Diagnosis not present

## 2016-07-01 DIAGNOSIS — I509 Heart failure, unspecified: Secondary | ICD-10-CM | POA: Diagnosis not present

## 2016-07-01 DIAGNOSIS — J449 Chronic obstructive pulmonary disease, unspecified: Secondary | ICD-10-CM | POA: Diagnosis not present

## 2016-07-04 DIAGNOSIS — I872 Venous insufficiency (chronic) (peripheral): Secondary | ICD-10-CM | POA: Diagnosis not present

## 2016-07-04 DIAGNOSIS — I509 Heart failure, unspecified: Secondary | ICD-10-CM | POA: Diagnosis not present

## 2016-07-04 DIAGNOSIS — L97512 Non-pressure chronic ulcer of other part of right foot with fat layer exposed: Secondary | ICD-10-CM | POA: Diagnosis not present

## 2016-07-04 DIAGNOSIS — I110 Hypertensive heart disease with heart failure: Secondary | ICD-10-CM | POA: Diagnosis not present

## 2016-07-04 DIAGNOSIS — J449 Chronic obstructive pulmonary disease, unspecified: Secondary | ICD-10-CM | POA: Diagnosis not present

## 2016-07-04 DIAGNOSIS — I4891 Unspecified atrial fibrillation: Secondary | ICD-10-CM | POA: Diagnosis not present

## 2016-07-05 DIAGNOSIS — I509 Heart failure, unspecified: Secondary | ICD-10-CM | POA: Diagnosis not present

## 2016-07-05 DIAGNOSIS — I70261 Atherosclerosis of native arteries of extremities with gangrene, right leg: Secondary | ICD-10-CM | POA: Diagnosis not present

## 2016-07-05 DIAGNOSIS — L97512 Non-pressure chronic ulcer of other part of right foot with fat layer exposed: Secondary | ICD-10-CM | POA: Diagnosis not present

## 2016-07-05 DIAGNOSIS — I110 Hypertensive heart disease with heart failure: Secondary | ICD-10-CM | POA: Diagnosis not present

## 2016-07-05 DIAGNOSIS — I872 Venous insufficiency (chronic) (peripheral): Secondary | ICD-10-CM | POA: Diagnosis not present

## 2016-07-05 DIAGNOSIS — I4891 Unspecified atrial fibrillation: Secondary | ICD-10-CM | POA: Diagnosis not present

## 2016-07-05 DIAGNOSIS — L97514 Non-pressure chronic ulcer of other part of right foot with necrosis of bone: Secondary | ICD-10-CM | POA: Diagnosis not present

## 2016-07-05 DIAGNOSIS — J449 Chronic obstructive pulmonary disease, unspecified: Secondary | ICD-10-CM | POA: Diagnosis not present

## 2016-07-05 DIAGNOSIS — I96 Gangrene, not elsewhere classified: Secondary | ICD-10-CM | POA: Diagnosis not present

## 2016-07-06 DIAGNOSIS — I509 Heart failure, unspecified: Secondary | ICD-10-CM | POA: Diagnosis not present

## 2016-07-06 DIAGNOSIS — J449 Chronic obstructive pulmonary disease, unspecified: Secondary | ICD-10-CM | POA: Diagnosis not present

## 2016-07-06 DIAGNOSIS — I872 Venous insufficiency (chronic) (peripheral): Secondary | ICD-10-CM | POA: Diagnosis not present

## 2016-07-06 DIAGNOSIS — L97512 Non-pressure chronic ulcer of other part of right foot with fat layer exposed: Secondary | ICD-10-CM | POA: Diagnosis not present

## 2016-07-06 DIAGNOSIS — I4891 Unspecified atrial fibrillation: Secondary | ICD-10-CM | POA: Diagnosis not present

## 2016-07-06 DIAGNOSIS — I110 Hypertensive heart disease with heart failure: Secondary | ICD-10-CM | POA: Diagnosis not present

## 2016-07-20 DIAGNOSIS — I110 Hypertensive heart disease with heart failure: Secondary | ICD-10-CM | POA: Diagnosis not present

## 2016-07-20 DIAGNOSIS — I509 Heart failure, unspecified: Secondary | ICD-10-CM | POA: Diagnosis not present

## 2016-07-20 DIAGNOSIS — J449 Chronic obstructive pulmonary disease, unspecified: Secondary | ICD-10-CM | POA: Diagnosis not present

## 2016-07-20 DIAGNOSIS — I4891 Unspecified atrial fibrillation: Secondary | ICD-10-CM | POA: Diagnosis not present

## 2016-07-20 DIAGNOSIS — L97512 Non-pressure chronic ulcer of other part of right foot with fat layer exposed: Secondary | ICD-10-CM | POA: Diagnosis not present

## 2016-07-20 DIAGNOSIS — I872 Venous insufficiency (chronic) (peripheral): Secondary | ICD-10-CM | POA: Diagnosis not present

## 2016-07-26 DIAGNOSIS — I872 Venous insufficiency (chronic) (peripheral): Secondary | ICD-10-CM | POA: Diagnosis not present

## 2016-07-26 DIAGNOSIS — I4891 Unspecified atrial fibrillation: Secondary | ICD-10-CM | POA: Diagnosis not present

## 2016-07-26 DIAGNOSIS — I509 Heart failure, unspecified: Secondary | ICD-10-CM | POA: Diagnosis not present

## 2016-07-26 DIAGNOSIS — I110 Hypertensive heart disease with heart failure: Secondary | ICD-10-CM | POA: Diagnosis not present

## 2016-07-26 DIAGNOSIS — L97512 Non-pressure chronic ulcer of other part of right foot with fat layer exposed: Secondary | ICD-10-CM | POA: Diagnosis not present

## 2016-07-26 DIAGNOSIS — J449 Chronic obstructive pulmonary disease, unspecified: Secondary | ICD-10-CM | POA: Diagnosis not present

## 2016-07-29 DIAGNOSIS — L97512 Non-pressure chronic ulcer of other part of right foot with fat layer exposed: Secondary | ICD-10-CM | POA: Diagnosis not present

## 2016-07-29 DIAGNOSIS — I509 Heart failure, unspecified: Secondary | ICD-10-CM | POA: Diagnosis not present

## 2016-07-29 DIAGNOSIS — I872 Venous insufficiency (chronic) (peripheral): Secondary | ICD-10-CM | POA: Diagnosis not present

## 2016-07-29 DIAGNOSIS — I4891 Unspecified atrial fibrillation: Secondary | ICD-10-CM | POA: Diagnosis not present

## 2016-07-29 DIAGNOSIS — J449 Chronic obstructive pulmonary disease, unspecified: Secondary | ICD-10-CM | POA: Diagnosis not present

## 2016-07-29 DIAGNOSIS — I110 Hypertensive heart disease with heart failure: Secondary | ICD-10-CM | POA: Diagnosis not present

## 2016-08-02 DIAGNOSIS — I110 Hypertensive heart disease with heart failure: Secondary | ICD-10-CM | POA: Diagnosis not present

## 2016-08-02 DIAGNOSIS — L97512 Non-pressure chronic ulcer of other part of right foot with fat layer exposed: Secondary | ICD-10-CM | POA: Diagnosis not present

## 2016-08-02 DIAGNOSIS — I509 Heart failure, unspecified: Secondary | ICD-10-CM | POA: Diagnosis not present

## 2016-08-02 DIAGNOSIS — J449 Chronic obstructive pulmonary disease, unspecified: Secondary | ICD-10-CM | POA: Diagnosis not present

## 2016-08-02 DIAGNOSIS — I872 Venous insufficiency (chronic) (peripheral): Secondary | ICD-10-CM | POA: Diagnosis not present

## 2016-08-02 DIAGNOSIS — I4891 Unspecified atrial fibrillation: Secondary | ICD-10-CM | POA: Diagnosis not present

## 2016-08-04 DIAGNOSIS — I509 Heart failure, unspecified: Secondary | ICD-10-CM | POA: Diagnosis not present

## 2016-08-04 DIAGNOSIS — L97512 Non-pressure chronic ulcer of other part of right foot with fat layer exposed: Secondary | ICD-10-CM | POA: Diagnosis not present

## 2016-08-04 DIAGNOSIS — I872 Venous insufficiency (chronic) (peripheral): Secondary | ICD-10-CM | POA: Diagnosis not present

## 2016-08-04 DIAGNOSIS — J449 Chronic obstructive pulmonary disease, unspecified: Secondary | ICD-10-CM | POA: Diagnosis not present

## 2016-08-04 DIAGNOSIS — I110 Hypertensive heart disease with heart failure: Secondary | ICD-10-CM | POA: Diagnosis not present

## 2016-08-04 DIAGNOSIS — I4891 Unspecified atrial fibrillation: Secondary | ICD-10-CM | POA: Diagnosis not present

## 2016-08-09 DIAGNOSIS — I872 Venous insufficiency (chronic) (peripheral): Secondary | ICD-10-CM | POA: Diagnosis not present

## 2016-08-09 DIAGNOSIS — J449 Chronic obstructive pulmonary disease, unspecified: Secondary | ICD-10-CM | POA: Diagnosis not present

## 2016-08-09 DIAGNOSIS — I110 Hypertensive heart disease with heart failure: Secondary | ICD-10-CM | POA: Diagnosis not present

## 2016-08-09 DIAGNOSIS — I509 Heart failure, unspecified: Secondary | ICD-10-CM | POA: Diagnosis not present

## 2016-08-09 DIAGNOSIS — L97512 Non-pressure chronic ulcer of other part of right foot with fat layer exposed: Secondary | ICD-10-CM | POA: Diagnosis not present

## 2016-08-09 DIAGNOSIS — I4891 Unspecified atrial fibrillation: Secondary | ICD-10-CM | POA: Diagnosis not present

## 2016-08-16 DIAGNOSIS — I110 Hypertensive heart disease with heart failure: Secondary | ICD-10-CM | POA: Diagnosis not present

## 2016-08-16 DIAGNOSIS — I509 Heart failure, unspecified: Secondary | ICD-10-CM | POA: Diagnosis not present

## 2016-08-16 DIAGNOSIS — L97512 Non-pressure chronic ulcer of other part of right foot with fat layer exposed: Secondary | ICD-10-CM | POA: Diagnosis not present

## 2016-08-16 DIAGNOSIS — I4891 Unspecified atrial fibrillation: Secondary | ICD-10-CM | POA: Diagnosis not present

## 2016-08-16 DIAGNOSIS — J449 Chronic obstructive pulmonary disease, unspecified: Secondary | ICD-10-CM | POA: Diagnosis not present

## 2016-08-16 DIAGNOSIS — I872 Venous insufficiency (chronic) (peripheral): Secondary | ICD-10-CM | POA: Diagnosis not present

## 2016-08-23 DIAGNOSIS — I110 Hypertensive heart disease with heart failure: Secondary | ICD-10-CM | POA: Diagnosis not present

## 2016-08-23 DIAGNOSIS — J449 Chronic obstructive pulmonary disease, unspecified: Secondary | ICD-10-CM | POA: Diagnosis not present

## 2016-08-23 DIAGNOSIS — I509 Heart failure, unspecified: Secondary | ICD-10-CM | POA: Diagnosis not present

## 2016-08-23 DIAGNOSIS — I4891 Unspecified atrial fibrillation: Secondary | ICD-10-CM | POA: Diagnosis not present

## 2016-08-23 DIAGNOSIS — L97512 Non-pressure chronic ulcer of other part of right foot with fat layer exposed: Secondary | ICD-10-CM | POA: Diagnosis not present

## 2016-08-23 DIAGNOSIS — I872 Venous insufficiency (chronic) (peripheral): Secondary | ICD-10-CM | POA: Diagnosis not present

## 2016-08-24 ENCOUNTER — Encounter: Payer: Self-pay | Admitting: Family Medicine

## 2016-08-24 ENCOUNTER — Ambulatory Visit (INDEPENDENT_AMBULATORY_CARE_PROVIDER_SITE_OTHER): Payer: Medicare Other | Admitting: Family Medicine

## 2016-08-24 DIAGNOSIS — F321 Major depressive disorder, single episode, moderate: Secondary | ICD-10-CM

## 2016-08-24 MED ORDER — ESCITALOPRAM OXALATE 10 MG PO TABS: 10.0000 mg | ORAL_TABLET | Freq: Every day | ORAL | 5 refills | Status: DC

## 2016-08-24 NOTE — Progress Notes (Signed)
   Subjective:    Patient ID: [name] [name], male    DOB: 10/08/1932, 83 y.o.   MRN: [id number]  HPI Patient arrives to discuss depression. Patient states he hurts all the time and cant get around good and it is making him feel depressed. Patient arrives office for very protracted discussion regarding his mood.  Has been under a lot of stress with wife with progressive dementia and other clinical concerns over the past year.  In addition has a his own concerning issues including a chronic ulceration of the foot, where the vascular specialists is starting the talk about potential amputation of his foot.  Patient also challenge somewhat by incipient forgetfulness. Long-standing history of alcohol abuse. Notes feeling down often. Mood is frequently sad. No suicidal or homicidal thoughts. On further history we placed him on Lexapro for much of this. But he is come off it. Was not sure why he was taking.  Review of Systems No headache, no major weight loss or weight gain, no chest pain no back pain abdominal pain no change in bowel habits complete ROS otherwise negative     Objective:   Physical Exam Alert vitals stable, NAD. Blood pressure good on repeat. HEENT normal. Lungs clear. Heart regular rate and rhythm.        Assessment & Plan:  Impression depression discussed. A great length. Patient absolutely needs to take his medicine. Rationale discussed. Side effects benefits and medicines discussed. Greater than 25 minutes spent in discussion today with greater than 50% of that in discussion of patient's symptoms is diagnosis potential interventions importance of compliance, family challenges.

## 2016-08-25 ENCOUNTER — Ambulatory Visit: Payer: Medicare Other | Admitting: Family Medicine

## 2016-08-28 DIAGNOSIS — F321 Major depressive disorder, single episode, moderate: Secondary | ICD-10-CM | POA: Insufficient documentation

## 2016-08-30 DIAGNOSIS — I70261 Atherosclerosis of native arteries of extremities with gangrene, right leg: Secondary | ICD-10-CM | POA: Diagnosis not present

## 2016-08-30 DIAGNOSIS — I739 Peripheral vascular disease, unspecified: Secondary | ICD-10-CM | POA: Diagnosis not present

## 2016-08-30 DIAGNOSIS — L97514 Non-pressure chronic ulcer of other part of right foot with necrosis of bone: Secondary | ICD-10-CM | POA: Diagnosis not present

## 2016-09-05 DIAGNOSIS — I70209 Unspecified atherosclerosis of native arteries of extremities, unspecified extremity: Secondary | ICD-10-CM | POA: Diagnosis not present

## 2016-09-05 DIAGNOSIS — I96 Gangrene, not elsewhere classified: Secondary | ICD-10-CM | POA: Diagnosis not present

## 2016-09-13 DIAGNOSIS — L97412 Non-pressure chronic ulcer of right heel and midfoot with fat layer exposed: Secondary | ICD-10-CM | POA: Diagnosis not present

## 2016-09-13 DIAGNOSIS — L97516 Non-pressure chronic ulcer of other part of right foot with bone involvement without evidence of necrosis: Secondary | ICD-10-CM | POA: Diagnosis not present

## 2016-09-13 DIAGNOSIS — I10 Essential (primary) hypertension: Secondary | ICD-10-CM | POA: Diagnosis not present

## 2016-09-13 DIAGNOSIS — L97512 Non-pressure chronic ulcer of other part of right foot with fat layer exposed: Secondary | ICD-10-CM | POA: Diagnosis not present

## 2016-09-13 DIAGNOSIS — Z882 Allergy status to sulfonamides status: Secondary | ICD-10-CM | POA: Diagnosis not present

## 2016-09-13 DIAGNOSIS — I70235 Atherosclerosis of native arteries of right leg with ulceration of other part of foot: Secondary | ICD-10-CM | POA: Diagnosis not present

## 2016-09-13 DIAGNOSIS — I499 Cardiac arrhythmia, unspecified: Secondary | ICD-10-CM | POA: Diagnosis not present

## 2016-09-13 DIAGNOSIS — Z822 Family history of deafness and hearing loss: Secondary | ICD-10-CM | POA: Diagnosis not present

## 2016-09-13 DIAGNOSIS — Z885 Allergy status to narcotic agent status: Secondary | ICD-10-CM | POA: Diagnosis not present

## 2016-09-13 DIAGNOSIS — M1990 Unspecified osteoarthritis, unspecified site: Secondary | ICD-10-CM | POA: Diagnosis not present

## 2016-09-19 DIAGNOSIS — L03116 Cellulitis of left lower limb: Secondary | ICD-10-CM | POA: Diagnosis not present

## 2016-09-19 DIAGNOSIS — Z882 Allergy status to sulfonamides status: Secondary | ICD-10-CM | POA: Diagnosis not present

## 2016-09-19 DIAGNOSIS — I82402 Acute embolism and thrombosis of unspecified deep veins of left lower extremity: Secondary | ICD-10-CM | POA: Diagnosis not present

## 2016-09-19 DIAGNOSIS — I82401 Acute embolism and thrombosis of unspecified deep veins of right lower extremity: Secondary | ICD-10-CM | POA: Diagnosis not present

## 2016-09-19 DIAGNOSIS — L97513 Non-pressure chronic ulcer of other part of right foot with necrosis of muscle: Secondary | ICD-10-CM | POA: Diagnosis not present

## 2016-09-19 DIAGNOSIS — L97518 Non-pressure chronic ulcer of other part of right foot with other specified severity: Secondary | ICD-10-CM | POA: Diagnosis not present

## 2016-09-19 DIAGNOSIS — L97519 Non-pressure chronic ulcer of other part of right foot with unspecified severity: Secondary | ICD-10-CM | POA: Diagnosis not present

## 2016-09-19 DIAGNOSIS — L97516 Non-pressure chronic ulcer of other part of right foot with bone involvement without evidence of necrosis: Secondary | ICD-10-CM | POA: Diagnosis not present

## 2016-09-19 DIAGNOSIS — Z885 Allergy status to narcotic agent status: Secondary | ICD-10-CM | POA: Diagnosis not present

## 2016-09-19 DIAGNOSIS — I872 Venous insufficiency (chronic) (peripheral): Secondary | ICD-10-CM | POA: Diagnosis not present

## 2016-09-19 DIAGNOSIS — L03115 Cellulitis of right lower limb: Secondary | ICD-10-CM | POA: Diagnosis not present

## 2016-09-20 DIAGNOSIS — L97513 Non-pressure chronic ulcer of other part of right foot with necrosis of muscle: Secondary | ICD-10-CM | POA: Diagnosis not present

## 2016-09-20 DIAGNOSIS — L97518 Non-pressure chronic ulcer of other part of right foot with other specified severity: Secondary | ICD-10-CM | POA: Diagnosis not present

## 2016-09-20 DIAGNOSIS — L97516 Non-pressure chronic ulcer of other part of right foot with bone involvement without evidence of necrosis: Secondary | ICD-10-CM | POA: Diagnosis not present

## 2016-09-21 ENCOUNTER — Other Ambulatory Visit: Payer: Self-pay

## 2016-09-21 MED ORDER — RIVAROXABAN 20 MG PO TABS: 20.0000 mg | ORAL_TABLET | Freq: Every day | ORAL | 1 refills | Status: DC

## 2016-09-21 MED ORDER — RIVAROXABAN 15 MG PO TABS: ORAL_TABLET | ORAL | 0 refills | Status: DC

## 2016-09-21 NOTE — Progress Notes (Unsigned)
xare

## 2016-10-04 ENCOUNTER — Inpatient Hospital Stay (HOSPITAL_COMMUNITY)
Admission: EM | Admit: 2016-10-04 | Discharge: 2016-10-08 | DRG: 291 | Disposition: A | Discharge status: Home Health | Payer: Medicare Other | Attending: Family Medicine | Admitting: Family Medicine

## 2016-10-04 ENCOUNTER — Encounter (HOSPITAL_COMMUNITY): Payer: Self-pay | Admitting: *Deleted

## 2016-10-04 ENCOUNTER — Emergency Department (HOSPITAL_COMMUNITY): Payer: Medicare Other

## 2016-10-04 ENCOUNTER — Inpatient Hospital Stay (HOSPITAL_COMMUNITY): Payer: Medicare Other

## 2016-10-04 DIAGNOSIS — I420 Dilated cardiomyopathy: Secondary | ICD-10-CM | POA: Diagnosis present

## 2016-10-04 DIAGNOSIS — I480 Paroxysmal atrial fibrillation: Secondary | ICD-10-CM | POA: Diagnosis not present

## 2016-10-04 DIAGNOSIS — I5031 Acute diastolic (congestive) heart failure: Secondary | ICD-10-CM

## 2016-10-04 DIAGNOSIS — G894 Chronic pain syndrome: Secondary | ICD-10-CM | POA: Diagnosis present

## 2016-10-04 DIAGNOSIS — K644 Residual hemorrhoidal skin tags: Secondary | ICD-10-CM | POA: Diagnosis present

## 2016-10-04 DIAGNOSIS — N400 Enlarged prostate without lower urinary tract symptoms: Secondary | ICD-10-CM | POA: Diagnosis present

## 2016-10-04 DIAGNOSIS — Z96643 Presence of artificial hip joint, bilateral: Secondary | ICD-10-CM | POA: Diagnosis present

## 2016-10-04 DIAGNOSIS — J440 Chronic obstructive pulmonary disease with acute lower respiratory infection: Secondary | ICD-10-CM | POA: Diagnosis present

## 2016-10-04 DIAGNOSIS — I878 Other specified disorders of veins: Secondary | ICD-10-CM | POA: Diagnosis present

## 2016-10-04 DIAGNOSIS — Z7982 Long term (current) use of aspirin: Secondary | ICD-10-CM

## 2016-10-04 DIAGNOSIS — I509 Heart failure, unspecified: Secondary | ICD-10-CM

## 2016-10-04 DIAGNOSIS — I82402 Acute embolism and thrombosis of unspecified deep veins of left lower extremity: Secondary | ICD-10-CM | POA: Diagnosis present

## 2016-10-04 DIAGNOSIS — K219 Gastro-esophageal reflux disease without esophagitis: Secondary | ICD-10-CM | POA: Diagnosis present

## 2016-10-04 DIAGNOSIS — R791 Abnormal coagulation profile: Secondary | ICD-10-CM | POA: Diagnosis present

## 2016-10-04 DIAGNOSIS — Z961 Presence of intraocular lens: Secondary | ICD-10-CM | POA: Diagnosis present

## 2016-10-04 DIAGNOSIS — Z7983 Long term (current) use of bisphosphonates: Secondary | ICD-10-CM

## 2016-10-04 DIAGNOSIS — R0902 Hypoxemia: Secondary | ICD-10-CM | POA: Diagnosis not present

## 2016-10-04 DIAGNOSIS — K5909 Other constipation: Secondary | ICD-10-CM | POA: Diagnosis present

## 2016-10-04 DIAGNOSIS — J449 Chronic obstructive pulmonary disease, unspecified: Secondary | ICD-10-CM | POA: Diagnosis present

## 2016-10-04 DIAGNOSIS — Z885 Allergy status to narcotic agent status: Secondary | ICD-10-CM

## 2016-10-04 DIAGNOSIS — Z79899 Other long term (current) drug therapy: Secondary | ICD-10-CM

## 2016-10-04 DIAGNOSIS — E538 Deficiency of other specified B group vitamins: Secondary | ICD-10-CM | POA: Diagnosis present

## 2016-10-04 DIAGNOSIS — Z833 Family history of diabetes mellitus: Secondary | ICD-10-CM

## 2016-10-04 DIAGNOSIS — J189 Pneumonia, unspecified organism: Secondary | ICD-10-CM | POA: Diagnosis not present

## 2016-10-04 DIAGNOSIS — Z8249 Family history of ischemic heart disease and other diseases of the circulatory system: Secondary | ICD-10-CM

## 2016-10-04 DIAGNOSIS — L97511 Non-pressure chronic ulcer of other part of right foot limited to breakdown of skin: Secondary | ICD-10-CM | POA: Diagnosis present

## 2016-10-04 DIAGNOSIS — M810 Age-related osteoporosis without current pathological fracture: Secondary | ICD-10-CM | POA: Diagnosis present

## 2016-10-04 DIAGNOSIS — F321 Major depressive disorder, single episode, moderate: Secondary | ICD-10-CM | POA: Diagnosis present

## 2016-10-04 DIAGNOSIS — D090 Carcinoma in situ of bladder: Secondary | ICD-10-CM | POA: Diagnosis present

## 2016-10-04 DIAGNOSIS — D689 Coagulation defect, unspecified: Secondary | ICD-10-CM

## 2016-10-04 DIAGNOSIS — R079 Chest pain, unspecified: Secondary | ICD-10-CM

## 2016-10-04 DIAGNOSIS — J9601 Acute respiratory failure with hypoxia: Secondary | ICD-10-CM | POA: Diagnosis present

## 2016-10-04 DIAGNOSIS — R0609 Other forms of dyspnea: Secondary | ICD-10-CM

## 2016-10-04 DIAGNOSIS — I110 Hypertensive heart disease with heart failure: Secondary | ICD-10-CM | POA: Diagnosis not present

## 2016-10-04 DIAGNOSIS — R001 Bradycardia, unspecified: Secondary | ICD-10-CM | POA: Diagnosis not present

## 2016-10-04 DIAGNOSIS — I10 Essential (primary) hypertension: Secondary | ICD-10-CM | POA: Diagnosis not present

## 2016-10-04 DIAGNOSIS — Z882 Allergy status to sulfonamides status: Secondary | ICD-10-CM

## 2016-10-04 DIAGNOSIS — R0602 Shortness of breath: Secondary | ICD-10-CM | POA: Diagnosis not present

## 2016-10-04 DIAGNOSIS — J431 Panlobular emphysema: Secondary | ICD-10-CM | POA: Diagnosis not present

## 2016-10-04 DIAGNOSIS — L8990 Pressure ulcer of unspecified site, unspecified stage: Secondary | ICD-10-CM | POA: Insufficient documentation

## 2016-10-04 DIAGNOSIS — Z7901 Long term (current) use of anticoagulants: Secondary | ICD-10-CM | POA: Diagnosis not present

## 2016-10-04 DIAGNOSIS — J9600 Acute respiratory failure, unspecified whether with hypoxia or hypercapnia: Secondary | ICD-10-CM | POA: Diagnosis present

## 2016-10-04 LAB — LACTIC ACID, PLASMA
LACTIC ACID, VENOUS: 1 mmol/L (ref 0.5–1.9)
Lactic Acid, Venous: 0.8 mmol/L (ref 0.5–1.9)

## 2016-10-04 LAB — ECHOCARDIOGRAM COMPLETE
CHL CUP DOP CALC LVOT VTI: 24.5 cm
CHL CUP RV SYS PRESS: 36 mmHg
CHL CUP STROKE VOLUME: 63 mL
E decel time: 236 msec
FS: 39 % (ref 28–44)
HEIGHTINCHES: 71 in
IVS/LV PW RATIO, ED: 1.04
LA ID, A-P, ES: 43 mm
LA diam index: 2.03 cm/m2
LA vol A4C: 122 ml
LAVOL: 130 mL
LAVOLIN: 61.3 mL/m2
LEFT ATRIUM END SYS DIAM: 43 mm
LV PW d: 15.2 mm — AB (ref 0.6–1.1)
LV dias vol index: 46 mL/m2
LV dias vol: 98 mL (ref 62–150)
LV sys vol index: 17 mL/m2
LVOT area: 4.15 cm2
LVOT peak grad rest: 7 mmHg
LVOTD: 23 mm
LVOTPV: 135 cm/s
LVOTSV: 102 mL
LVSYSVOL: 35 mL
MV Dec: 236
MV pk E vel: 96.4 m/s
MVPG: 4 mmHg
Reg peak vel: 287 cm/s
Simpson's disk: 64
TAPSE: 13.4 mm
TRMAXVEL: 287 cm/s
WEIGHTICAEL: 3120 [oz_av]

## 2016-10-04 LAB — CBC
HCT: 30.9 % — ABNORMAL LOW (ref 39.0–52.0)
Hemoglobin: 10.1 g/dL — ABNORMAL LOW (ref 13.0–17.0)
MCH: 29.7 pg (ref 26.0–34.0)
MCHC: 32.7 g/dL (ref 30.0–36.0)
MCV: 90.9 fL (ref 78.0–100.0)
PLATELETS: 223 10*3/uL (ref 150–400)
RBC: 3.4 MIL/uL — ABNORMAL LOW (ref 4.22–5.81)
RDW: 14.7 % (ref 11.5–15.5)
WBC: 11.4 10*3/uL — AB (ref 4.0–10.5)

## 2016-10-04 LAB — BASIC METABOLIC PANEL
Anion gap: 12 (ref 5–15)
BUN: 45 mg/dL — AB (ref 6–20)
CHLORIDE: 101 mmol/L (ref 101–111)
CO2: 23 mmol/L (ref 22–32)
Calcium: 8.7 mg/dL — ABNORMAL LOW (ref 8.9–10.3)
Creatinine, Ser: 1.03 mg/dL (ref 0.61–1.24)
GFR calc non Af Amer: >60 mL/min (ref >60)
Glucose, Bld: 125 mg/dL — ABNORMAL HIGH (ref 65–99)
Potassium: 3.9 mmol/L (ref 3.5–5.1)
SODIUM: 136 mmol/L (ref 135–145)

## 2016-10-04 LAB — APTT
aPTT: 128 seconds — ABNORMAL HIGH (ref 24–36)
aPTT: 129 seconds — ABNORMAL HIGH (ref 24–36)

## 2016-10-04 LAB — PROTIME-INR
INR: >10
Prothrombin Time: >90 seconds — ABNORMAL HIGH (ref 11.4–15.2)

## 2016-10-04 LAB — TYPE AND SCREEN
ABO/RH(D): A POS
ANTIBODY SCREEN: NEGATIVE

## 2016-10-04 LAB — TROPONIN I
TROPONIN I: 0.03 ng/mL — AB (ref <0.03)
TROPONIN I: 0.03 ng/mL — AB (ref <0.03)
Troponin I: 0.03 ng/mL (ref <0.03)
Troponin I: 0.03 ng/mL (ref <0.03)

## 2016-10-04 LAB — BRAIN NATRIURETIC PEPTIDE: B NATRIURETIC PEPTIDE 5: 666 pg/mL — AB (ref 0.0–100.0)

## 2016-10-04 LAB — I-STAT TROPONIN, ED: TROPONIN I, POC: 0.03 ng/mL (ref 0.00–0.08)

## 2016-10-04 LAB — STREP PNEUMONIAE URINARY ANTIGEN: Strep Pneumo Urinary Antigen: NEGATIVE

## 2016-10-04 MED ORDER — SODIUM CHLORIDE 0.9% FLUSH: 3.0000 mL | INTRAVENOUS | Status: DC | PRN

## 2016-10-04 MED ORDER — IPRATROPIUM-ALBUTEROL 0.5-2.5 (3) MG/3ML IN SOLN
3.0000 mL | Freq: Four times a day (QID) | RESPIRATORY_TRACT | Status: DC
  Administered 2016-10-04 (×2): 3 mL via RESPIRATORY_TRACT
  Filled 2016-10-04 (×2): qty 3

## 2016-10-04 MED ORDER — ASPIRIN 325 MG PO TABS
325.0000 mg | ORAL_TABLET | Freq: Every day | ORAL | Status: DC
  Administered 2016-10-05 – 2016-10-08 (×4): 325 mg via ORAL
  Filled 2016-10-04 (×5): qty 1

## 2016-10-04 MED ORDER — SODIUM CHLORIDE 0.9% FLUSH
3.0000 mL | Freq: Two times a day (BID) | INTRAVENOUS | Status: DC
  Administered 2016-10-04 – 2016-10-08 (×9): 3 mL via INTRAVENOUS

## 2016-10-04 MED ORDER — PANTOPRAZOLE SODIUM 40 MG PO TBEC
40.0000 mg | DELAYED_RELEASE_TABLET | Freq: Every day | ORAL | Status: DC
  Administered 2016-10-04 – 2016-10-08 (×5): 40 mg via ORAL
  Filled 2016-10-04 (×5): qty 1

## 2016-10-04 MED ORDER — IOPAMIDOL (ISOVUE-370) INJECTION 76%
100.0000 mL | Freq: Once | INTRAVENOUS | Status: AC | PRN
  Administered 2016-10-04: 100 mL via INTRAVENOUS

## 2016-10-04 MED ORDER — LISINOPRIL 5 MG PO TABS
2.5000 mg | ORAL_TABLET | Freq: Every day | ORAL | Status: DC
  Administered 2016-10-05 – 2016-10-08 (×4): 2.5 mg via ORAL
  Filled 2016-10-04 (×5): qty 1

## 2016-10-04 MED ORDER — ASPIRIN 325 MG PO TABS: 325.0000 mg | ORAL_TABLET | Freq: Every day | ORAL | Status: DC

## 2016-10-04 MED ORDER — ESCITALOPRAM OXALATE 10 MG PO TABS
10.0000 mg | ORAL_TABLET | Freq: Every day | ORAL | Status: DC
  Administered 2016-10-04 – 2016-10-08 (×5): 10 mg via ORAL
  Filled 2016-10-04 (×5): qty 1

## 2016-10-04 MED ORDER — ALBUTEROL SULFATE (2.5 MG/3ML) 0.083% IN NEBU: 2.5000 mg | INHALATION_SOLUTION | Freq: Four times a day (QID) | RESPIRATORY_TRACT | Status: DC | PRN

## 2016-10-04 MED ORDER — DEXTROSE 5 % IV SOLN
500.0000 mg | Freq: Once | INTRAVENOUS | Status: AC
  Administered 2016-10-04: 500 mg via INTRAVENOUS
  Filled 2016-10-04: qty 500

## 2016-10-04 MED ORDER — DEXTROSE 5 % IV SOLN
1.0000 g | INTRAVENOUS | Status: DC
  Administered 2016-10-05 – 2016-10-08 (×4): 1 g via INTRAVENOUS
  Filled 2016-10-04 (×6): qty 10

## 2016-10-04 MED ORDER — VERAPAMIL HCL ER 180 MG PO TBCR
180.0000 mg | EXTENDED_RELEASE_TABLET | Freq: Every day | ORAL | Status: DC
  Administered 2016-10-05 – 2016-10-08 (×4): 180 mg via ORAL
  Filled 2016-10-04 (×5): qty 1

## 2016-10-04 MED ORDER — MILK AND MOLASSES ENEMA
1.0000 | Freq: Once | RECTAL | Status: AC
  Administered 2016-10-04: 250 mL via RECTAL

## 2016-10-04 MED ORDER — DEXTROSE 5 % IV SOLN
1.0000 g | Freq: Once | INTRAVENOUS | Status: AC
  Administered 2016-10-04: 1 g via INTRAVENOUS
  Filled 2016-10-04: qty 10

## 2016-10-04 MED ORDER — OXYCODONE-ACETAMINOPHEN 7.5-325 MG PO TABS
1.0000 | ORAL_TABLET | ORAL | Status: DC | PRN
  Administered 2016-10-04 – 2016-10-08 (×10): 1 via ORAL
  Filled 2016-10-04 (×11): qty 1

## 2016-10-04 MED ORDER — SODIUM CHLORIDE 0.9 % IV SOLN: 250.0000 mL | INTRAVENOUS | Status: DC | PRN

## 2016-10-04 MED ORDER — GUAIFENESIN ER 600 MG PO TB12
1200.0000 mg | ORAL_TABLET | Freq: Two times a day (BID) | ORAL | Status: DC
  Administered 2016-10-04 – 2016-10-08 (×9): 1200 mg via ORAL
  Filled 2016-10-04 (×9): qty 2

## 2016-10-04 MED ORDER — FUROSEMIDE 10 MG/ML IJ SOLN
40.0000 mg | Freq: Two times a day (BID) | INTRAMUSCULAR | Status: DC
  Administered 2016-10-04 – 2016-10-07 (×7): 40 mg via INTRAVENOUS
  Filled 2016-10-04 (×8): qty 4

## 2016-10-04 MED ORDER — SODIUM CHLORIDE 0.9 % IV SOLN: Freq: Once | INTRAVENOUS | Status: DC

## 2016-10-04 MED ORDER — AZITHROMYCIN 500 MG IV SOLR
500.0000 mg | INTRAVENOUS | Status: DC
  Administered 2016-10-05 – 2016-10-07 (×3): 500 mg via INTRAVENOUS
  Filled 2016-10-04 (×6): qty 500

## 2016-10-04 MED ORDER — LACTULOSE 10 GM/15ML PO SOLN
10.0000 g | Freq: Every day | ORAL | Status: DC | PRN
  Administered 2016-10-05: 10 g via ORAL
  Filled 2016-10-04: qty 30

## 2016-10-04 MED ORDER — IPRATROPIUM-ALBUTEROL 0.5-2.5 (3) MG/3ML IN SOLN
3.0000 mL | Freq: Three times a day (TID) | RESPIRATORY_TRACT | Status: DC
  Administered 2016-10-05 – 2016-10-08 (×11): 3 mL via RESPIRATORY_TRACT
  Filled 2016-10-04 (×11): qty 3

## 2016-10-04 MED ORDER — VITAMIN K1 10 MG/ML IJ SOLN
5.0000 mg | Freq: Once | INTRAMUSCULAR | Status: AC
  Administered 2016-10-04: 5 mg via SUBCUTANEOUS
  Filled 2016-10-04: qty 1

## 2016-10-04 NOTE — ED Notes (Signed)
CRITICAL VALUE ALERT  Critical value received:  inr >10 and pt>90  Date of notification:  10/04/16  Time of notification:  0620  Critical value read back:Yes.    Nurse who received alert:  Bronson Curb, RN  MD notified (1st page):  Dr Lynelle Doctor  Time of first page:  0620  MD notified (2nd page):  Time of second page:  Responding MD:  Dr Lynelle Doctor  Time MD responded:  [phone]

## 2016-10-04 NOTE — ED Triage Notes (Signed)
Pt c/o chest pain that radiates to his left arm for the last couple of days

## 2016-10-04 NOTE — Consult Note (Signed)
WOC Nurse wound consult note Reason for Consult: chronic right foot wounds, reviewed chart. Has been seen at [facility] and [facility] for foot wounds and was referred to Dr. Tanda Rockers at the [facility] wound care center.  He has recently moved his care to the wound care center in [place], [place].  He noted that he was recently prescribed a "salve" that made his foot feel better.  Once I talked with him further it was determined it was Sanytl which is an enzymatic debridement agent.  It was expensive for the patient to purchase. Not able to palpate pulse dorsalis in the foot but it is warm and I was able to palpate the posterior tibial  Wound type: arterial ulceration most likely, patient has been seen by [facility] in the past. Some venous skin changes in the gaiter area of the foot, however no edema and with the pain he reports suspect mixed etiology.  I am not able to locate ABI, however the patient reports "test on his circulation" in [place]. Pressure Injury POA: No Measurement:right dorsal foot: 4cm x 5cm x 0.2cm; right 3rd toe tip/dorsal surface: 3cm x 1.5cm x 0.2cm  Wound ZOX:WRUE are dry, 75% pink, 25% yellow fibrin Drainage (amount, consistency, odor) none Periwound: intact Dressing procedure/placement/frequency: Continue enzymatic debridement to clean up remainder of the wound bed, use medication from home per patient request and the expense.  Spoke with patient's son Julian Reil Vandall to bring from home and I advised him to make sure to take back to home with them at DC.   FU at DC with [facility] as scheduled.   Discussed POC with patient and bedside nurse.  Re consult if needed, will not follow at this time. Thanks  Britian Jentz [employer], RN,CWOCN, CNS [phone])

## 2016-10-04 NOTE — ED Notes (Signed)
Dr. Kerry Hough informed nurse that we would not be giving FFP. Will discontinue order.

## 2016-10-04 NOTE — ED Notes (Signed)
CRITICAL VALUE ALERT  Critical value received:troponin 0.03  Date of notification:  10/04/16  Time of notification:  0732  Critical value read back:Yes.    Nurse who received alert:  Gertha Calkin   MD notified (1st page):  Dr. Lynelle Doctor

## 2016-10-04 NOTE — ED Provider Notes (Signed)
[facility] Provider Note   CSN: [id number] Arrival date & time: 10/04/16  0434  Time seen 05:15 AM   History   Chief Complaint Chief Complaint  Patient presents with  . Chest Pain    HPI [name] [name] is a 83 y.o. male.  HPI  patient states about 7:30 yesterday morning he started having chest pain off and on. He states it lasts about an hour. He describes it as dull and aching. He states it's in the center of his chest and sometimes going down his left arm. He states exertion makes it worse, nothing makes it feel better. He has had nausea without vomiting but denies diaphoresis. He also complains of a lot of shortness of breath and dyspnea on exertion for at least a week. He has some swelling of his legs which is chronic. He states his chest pain left just as he was coming to the ED this morning. He states he was treated at [facility] last week and diagnosed with DVT in his left leg. He states they wanted to put him on? Xarelto however he did not think he could afford it and he is just taking aspirin. He states he has chronic problems with his lower legs and has drainage that is clear from them. He denies any prior history of heart problems.  PCP Laural Golden (Inactive)   Past Medical History:  Diagnosis Date  . Alcohol abuse    stop drinking since 01/2009  . Arthritis   . Asthma   . Atrial fibrillation (HCC)    off coumadin secondary to fall and subdural  hematoma   . B12 deficiency 9/10   started injection   . BPH (benign prostatic hyperplasia)   . Cancer (HCC) [facility]   BLADDER CANCER CELLS TX 2 MO AGO  . Carcinoma in situ of bladder   . Chronic kidney disease    RECENT UTI FEW MONTHS AGO TX WITH ANTIBIOTIC  . Compression fracture   . COPD (chronic obstructive pulmonary disease) (HCC)   . Diverticula of colon    few small scattered in the sigmoid colon  . Enlarged prostate   . External hemorrhoids 09/2014  . GERD (gastroesophageal reflux disease)    . H/O hiatal hernia   . Headache(784.0)   . Hypertension   . Nutcracker esophagus 2002   on EM  . Osteoporosis   . Peripheral edema   . Reflux   . Venous stasis    chronic     Patient Active Problem List   Diagnosis Date Noted  . Depression, major, single episode, moderate (HCC) 08/28/2016  . Achalasia 12/09/2015  . Atherosclerosis of native arteries of extremity with rest pain (HCC) 10/21/2015  . Acute esophagitis   . Congestive heart failure, unspecified 01/01/2014  . Chronic pain syndrome 10/10/2013  . S/P total hip arthroplasty 05/14/2013  . Osteoporosis, unspecified 04/23/2013  . Chronic atrial fibrillation (HCC) 09/19/2012  . Essential hypertension, benign 09/19/2012  . Status post hip replacement 04/12/2012  . Spinal stenosis of lumbar region 05/03/2011  . TOTAL HIP FOLLOW-UP 09/13/2010  . NONUNION OF FRACTURE 07/08/2010  . HIP FRACTURE, RIGHT 07/08/2010  . ALCOHOL ABUSE 05/15/2009  . ESOPHAGEAL MOTILITY DISORDER 05/15/2009  . GERD 05/15/2009  . Constipation 05/15/2009  . WEIGHT LOSS, RECENT 05/15/2009  . EXTERNAL HEMORRHOIDS 05/13/2009  . COPD (chronic obstructive pulmonary disease) (HCC) 05/13/2009  . Dysphagia 05/13/2009  . COMPRESSION FRACTURE, SPINE 03/19/2009  . HIGH BLOOD PRESSURE 03/19/2009    Past  Surgical History:  Procedure Laterality Date  . BLADDER ASPIRATION     PROC FOR CANCER CELLS   . CATARACT EXTRACTION W/PHACO  07/30/2012   Procedure: CATARACT EXTRACTION PHACO AND INTRAOCULAR LENS PLACEMENT (IOC);  Surgeon: Susa Simmonds, MD;  Location: [facility] ORS;  Service: Ophthalmology;  Laterality: Left;  CDE:31.66  . CATARACT EXTRACTION W/PHACO Right 11/12/2012   Procedure: CATARACT EXTRACTION PHACO AND INTRAOCULAR LENS PLACEMENT (IOC);  Surgeon: Susa Simmonds, MD;  Location: [facility] ORS;  Service: Ophthalmology;  Laterality: Right;  CDE 18.25  . CYSTOSCOPY  10/18/2011   Procedure: CYSTOSCOPY;  Surgeon: Anner Crete, MD;  Location: [facility] ORS;  Service: Urology;   Laterality: N/A;  . ESOPHAGOGASTRODUODENOSCOPY  02/11/2010   RMR: GE narrowing s/p dilation 56 & 58 Fr maloney otherwise normal  . ESOPHAGOGASTRODUODENOSCOPY N/A 10/01/2015   Dr.Rourk- mildly severe esophagitis, likely pill-induced, narrowed GE junction without fixed lesion found ?query achalasia, s/p maloney dilation, nomal stomach, normal second portion of the duodenum  . HERNIA REPAIR  12 YRS AGO   RIGHT SIDE   . JOINT REPLACEMENT     RT HIP X2  (1 REPLACEMENT)  . LUNG BIOPSY  10 YRS AGO   BENIGN  . MALONEY DILATION N/A 10/01/2015   Procedure: [name] DILATION;  Surgeon: Corbin Ade, MD;  Location: [facility] ENDO SUITE;  Service: Endoscopy;  Laterality: N/A;  . PERIPHERAL VASCULAR CATHETERIZATION N/A 10/23/2015   Procedure: Abdominal Aortogram w/Lower Extremity;  Surgeon: Sherren Kerns, MD;  Location: [facility] INVASIVE CV LAB;  Service: Cardiovascular;  Laterality: N/A;  . RHINOPLASTY  1974       Home Medications    Prior to Admission medications   Medication Sig Start Date End Date Taking? Authorizing Provider  alendronate (FOSAMAX) 70 MG tablet Take 1 tablet (70 mg total) by mouth every 7 (seven) days. Take with a full glass of water on an empty stomach. 04/22/13   Merlyn Albert, MD  aspirin 325 MG tablet Take 325 mg by mouth daily.    Historical Provider, MD  CALCIUM PO Take 1 tablet by mouth daily.    Historical Provider, MD  Cholecalciferol (VITAMIN D PO) Take 1 capsule by mouth daily.    Historical Provider, MD  DOCOSAHEXAENOIC ACID PO Take 1 g by mouth.    Historical Provider, MD  escitalopram (LEXAPRO) 10 MG tablet Take 1 tablet (10 mg total) by mouth daily. 08/24/16 08/24/17  Merlyn Albert, MD  furosemide (LASIX) 40 MG tablet Take 2 tablets every morning Patient taking differently: Take 80 mg by mouth daily as needed for fluid.  01/15/16   Babs Sciara, MD  lactulose (CHRONULAC) 10 GM/15ML solution Take one tablespoon qd prn constipation 02/17/16   Merlyn Albert, MD  naproxen  sodium (ANAPROX) 220 MG tablet Take 440 mg by mouth 2 (two) times daily with a meal.     Historical Provider, MD  oxyCODONE-acetaminophen (PERCOCET) 7.5-325 MG tablet Take 1 tablet by mouth every 4 (four) hours as needed for severe pain. 06/29/16   Merlyn Albert, MD  pantoprazole (PROTONIX) 40 MG tablet Take 1 tablet (40 mg total) by mouth daily. Patient not taking: Reported on 06/29/2016 09/10/15   Anice Paganini, NP  Rivaroxaban (XARELTO) 15 MG TABS tablet Take 1 tablet by mouth twice a day for 21 days. 09/21/16   Merlyn Albert, MD  rivaroxaban (XARELTO) 20 MG TABS tablet Take 1 tablet (20 mg total) by mouth daily with supper. 09/21/16  Merlyn Albert, MD  sucralfate (CARAFATE) 1 g tablet DISSOLVE 1 TABLET IN 2 OZ WATER AND DRINK BEFORE MEALS AND AT BEDTIMEFOR 2 WEEKS. 02/04/16   Anice Paganini, NP  verapamil (CALAN-SR) 180 MG CR tablet Take 1 tablet (180 mg total) by mouth daily. 06/29/16   Merlyn Albert, MD    Family History Family History  Problem Relation Age of Onset  . Cancer Brother   . Hypertension Mother   . Diabetes Mother   . Heart attack Mother   . Heart disease Mother     Social History Social History  Substance Use Topics  . Smoking status: Never Smoker  . Smokeless tobacco: Former Neurosurgeon    Types: Chew    Quit date: 06/28/1991  . Alcohol use No     Comment: history of alcohol abuse stop drinking 8/10 . Alcohol abuse for about 25 years   lives at home Lives with spouse   Allergies   Oxycodone-acetaminophen; Sulfonamide derivatives; and Tramadol   Review of Systems Review of Systems  All other systems reviewed and are negative.    Physical Exam Updated Vital Signs BP (!) 149/63   Pulse 71   Temp 97.8 F (36.6 C)   Resp (!) 22   Ht  (1.803 m)   Wt 195 lb (88.5 kg)   SpO2 100%   BMI 27.20 kg/m   Vital signs normal    Physical Exam  Constitutional: He is oriented to person, place, and time. He appears well-developed and well-nourished.  Non-toxic  appearance. He does not appear ill. He appears distressed.  HENT:  Head: Normocephalic and atraumatic.  Right Ear: External ear normal.  Left Ear: External ear normal.  Nose: Nose normal. No mucosal edema or rhinorrhea.  Mouth/Throat: Oropharynx is clear and moist and mucous membranes are normal. No dental abscesses or uvula swelling.  Eyes: Conjunctivae and EOM are normal. Pupils are equal, round, and reactive to light.  Neck: Normal range of motion and full passive range of motion without pain. Neck supple.  Cardiovascular: Normal rate, regular rhythm and normal heart sounds.  Exam reveals no gallop and no friction rub.   No murmur heard. Pulmonary/Chest: He is in respiratory distress. He has no wheezes. He has no rhonchi. He has rales. He exhibits no tenderness and no crepitus.  Patient has rales on bilateral lower lung fields. When he talks he appears to get dyspneic.  Abdominal: Soft. Normal appearance and bowel sounds are normal. He exhibits no distension. There is no tenderness. There is no rebound and no guarding.  Musculoskeletal: Normal range of motion. He exhibits no edema or tenderness.  Patient has getting edema trace of both lower extremities. He has chronic venous stasis changes of both lower extremities. He is having some clear serosanguineous drainage from both legs.  Neurological: He is alert and oriented to person, place, and time. He has normal strength. No cranial nerve deficit.  Skin: Skin is warm, dry and intact. No rash noted. No erythema. No pallor.  Psychiatric: He has a normal mood and affect. His speech is normal and behavior is normal. His mood appears not anxious.  Nursing note and vitals reviewed.    ED Treatments / Results  Labs (all labs ordered are listed, but only abnormal results are displayed) Results for orders placed or performed during the hospital encounter of 10/04/16  Basic metabolic panel  Result Value Ref Range   Sodium 136 135 - 145 mmol/L    Potassium  3.9 3.5 - 5.1 mmol/L   Chloride 101 101 - 111 mmol/L   CO2 23 22 - 32 mmol/L   Glucose, Bld 125 (H) 65 - 99 mg/dL   BUN 45 (H) 6 - 20 mg/dL   Creatinine, Ser 1.61 0.61 - 1.24 mg/dL   Calcium 8.7 (L) 8.9 - 10.3 mg/dL   GFR calc non Af Amer >60 >60 mL/min   GFR calc Af Amer >60 >60 mL/min   Anion gap 12 5 - 15  CBC  Result Value Ref Range   WBC 11.4 (H) 4.0 - 10.5 K/uL   RBC 3.40 (L) 4.22 - 5.81 MIL/uL   Hemoglobin 10.1 (L) 13.0 - 17.0 g/dL   HCT 09.6 (L) 04.5 - 40.9 %   MCV 90.9 78.0 - 100.0 fL   MCH 29.7 26.0 - 34.0 pg   MCHC 32.7 30.0 - 36.0 g/dL   RDW 81.1 91.4 - 78.2 %   Platelets 223 150 - 400 K/uL  Brain natriuretic peptide  Result Value Ref Range   B Natriuretic Peptide 666.0 (H) 0.0 - 100.0 pg/mL  Protime-INR  Result Value Ref Range   Prothrombin Time >90.0 (H) 11.4 - 15.2 seconds   INR >10.00 (HH)   APTT  Result Value Ref Range   aPTT 128 (H) 24 - 36 seconds  Protime-INR  Result Value Ref Range   Prothrombin Time >90.0 (H) 11.4 - 15.2 seconds   INR >10.00 (HH)   APTT  Result Value Ref Range   aPTT 129 (H) 24 - 36 seconds  Lactic acid, plasma  Result Value Ref Range   Lactic Acid, Venous 1.0 0.5 - 1.9 mmol/L  Troponin I  Result Value Ref Range   Troponin I 0.03 (HH) <0.03 ng/mL  I-stat troponin, ED  Result Value Ref Range   Troponin i, poc 0.03 0.00 - 0.08 ng/mL   Comment 3           Laboratory interpretation all normal except Borderline positive troponin, very elevated INR and PTT although patient states he's not taking any blood thinner other than aspirin, it was repeated and is still elevated, his BNP is only mildly elevated, leukocytosis, stable anemia    EKG  EKG Interpretation  Date/Time:  Tuesday October 04 2016 04:44:37 EDT Ventricular Rate:  103 PR Interval:    QRS Duration: 138 QT Interval:  376 QTC Calculation: 493 R Axis:   54 Text Interpretation:  Sinus or ectopic atrial tachycardia Multiple premature complexes, vent &  supraven Right bundle branch block Since last tracing 09 Feb 2016 ST depression, consider ischemia, lateral lds Confirmed by Veretta Sabourin  MD-I, Evelette Hollern ([id number]) on 10/04/2016 5:14:19 AM       Radiology Dg Chest 2 View  Result Date: 10/04/2016 CLINICAL DATA:  Acute onset of generalized chest pain, radiating to the left arm. Initial encounter. EXAM: CHEST  2 VIEW COMPARISON:  Chest radiograph performed 02/29/2016 FINDINGS: The lungs are well-aerated. Patchy bilateral airspace opacities raise concern for multifocal pneumonia, superimposed on chronic interstitial changes. There is no evidence of pleural effusion or pneumothorax. The heart is enlarged.  No acute osseous abnormalities are seen. IMPRESSION: 1. Patchy bilateral airspace opacities raise concern for multifocal pneumonia, superimposed on chronic interstitial changes. Underlying worsening chronic interstitial lung disease is a concern. 2. Cardiomegaly. Electronically Signed   By: Roanna Raider M.D.   On: 10/04/2016 05:35   Ct Angio Chest Pe W/cm &/or Wo Cm  Result Date: 10/04/2016 CLINICAL DATA:  Acute onset of shortness  of breath and generalized chest pain. Recently diagnosed with deep venous thrombosis. EXAM: CT ANGIOGRAPHY CHEST WITH CONTRAST TECHNIQUE: Multidetector CT imaging of the chest was performed using the standard protocol during bolus administration of intravenous contrast. Multiplanar CT image reconstructions and MIPs were obtained to evaluate the vascular anatomy. CONTRAST:  100 mL of Isovue 370 IV contrast COMPARISON:  Chest radiograph performed earlier today at 4:58 a.m., and CT of the chest performed 12/13/2002 FINDINGS: Cardiovascular:  There is no evidence of pulmonary embolus. The heart is mildly enlarged, with biatrial enlargement. Diffuse coronary artery calcifications are seen. Scattered calcification is seen about the thoracic aorta and proximal great vessels. Mediastinum/Nodes: A 1.7 cm azygoesophageal recess node is noted. A 2.1 cm  right paratracheal node is noted. A 1.3 cm periaortic node is seen. No pericardial effusion is identified. The thyroid gland is unremarkable. No axillary lymphadenopathy is appreciated. Lungs/Pleura: Small bilateral pleural effusions are noted. Diffuse ground-glass airspace opacification is seen bilaterally. This may reflect pulmonary edema or pneumonia, though pulmonary alveolar proteinosis could have a similar appearance. No pneumothorax is seen. Upper Abdomen: The visualized portions of the liver and spleen are grossly unremarkable. Musculoskeletal: No acute osseous abnormalities are identified. There is chronic loss of height at vertebral bodies T5, T7 and T11. The visualized musculature is unremarkable in appearance. Review of the MIP images confirms the above findings. IMPRESSION: 1. No evidence of pulmonary embolus. 2. Small bilateral pleural effusions. Diffuse ground-glass airspace opacification noted bilaterally. This may reflect pulmonary edema or pneumonia, though pulmonary alveolar proteinosis could have a similar appearance. 3. Enlarged mediastinal nodes may reflect underlying infection. 4. Mild cardiomegaly, with biatrial enlargement. Diffuse coronary artery calcifications seen. 5. Chronic loss of height at vertebral bodies T5, T7 and T11. Electronically Signed   By: Roanna Raider M.D.   On: 10/04/2016 06:15    Procedures Procedures (including critical care time)  CRITICAL CARE Performed by: Qusay Villada L Jassica Zazueta Total critical care time: 36 minutes Critical care time was exclusive of separately billable procedures and treating other patients. Critical care was necessary to treat or prevent imminent or life-threatening deterioration. Critical care was time spent personally by me on the following activities: development of treatment plan with patient and/or surrogate as well as nursing, discussions with consultants, evaluation of patient's response to treatment, examination of patient, obtaining  history from patient or surrogate, ordering and performing treatments and interventions, ordering and review of laboratory studies, ordering and review of radiographic studies, pulse oximetry and re-evaluation of patient's condition.  Medications Ordered in ED Medications  azithromycin (ZITHROMAX) 500 mg in dextrose 5 % 250 mL IVPB (500 mg Intravenous New Bag/Given 10/04/16 0721)  0.9 %  sodium chloride infusion (not administered)  phytonadione (VITAMIN K) SQ injection 5 mg (not administered)  iopamidol (ISOVUE-370) 76 % injection 100 mL (100 mLs Intravenous Contrast Given 10/04/16 0549)  cefTRIAXone (ROCEPHIN) 1 g in dextrose 5 % 50 mL IVPB (0 g Intravenous Stopped 10/04/16 0720)     Initial Impression / Assessment and Plan / ED Course  I have reviewed the triage vital signs and the nursing notes.  Pertinent labs & imaging results that were available during my care of the patient were reviewed by me and considered in my medical decision making (see chart for details).  During the course of my exam patient's nasal cannula came out of his nose and WAS noted to drop to 82% on room air. When I put the nasal cannula back and his nose it only improved  to about 86 or 87%. When I had him take a couple of big deep breaths threes nose his pulse ox improved into the low 90s.  With patient's history of DVT and not being on anticoagulants with the new shortness of breath and hypoxia concern was for pulmonary embolus, chest CT angiogram was ordered.  Lab states his PT PTT are elevated however patient is not on any anticoagulants other than aspirin. These labs were redrawn and I saw the lab draw them from his left arm, his IV was in his right arm.  07:32 AM Dr Kerry Hough, hospitalist, will admit  Patient's repeat INR and PTT are still elevated. I am going to have son get his pill bottles because he evidently is on some type of blood thinner. He was started on vitamin K and fresh frozen plasma to reverse his  coagulopathy.  Final Clinical Impressions(s) / ED Diagnoses   Final diagnoses:  Hypoxia  Community acquired pneumonia, unspecified laterality  Dyspnea on exertion  Coagulopathy (HCC)  Chest pain, unspecified type    Plan admission  Devoria Albe, MD, Concha Pyo, MD 10/04/16 [phone]

## 2016-10-04 NOTE — H&P (Signed)
History and Physical    [name] [name] ZOX:[id number] DOB: 1933/05/03 DOA: 10/04/2016  PCP: Laural Golden (Inactive)  Patient coming from: home  I have personally briefly reviewed patient's old medical records in [facility]  Chief Complaint: shortness of breath  HPI: [name] [name] is a 83 y.o. male with medical history significant of  hypertension, COPD, alcohol abuse (stopped drinking in 2010), atrial fibrillation (was previously on Coumadin, but taken off due to fall and subdural hematoma). Patient presents to the hospital with complaints of shortness of breath. He reports feeling progressively short of breath for the past few days. He's had a productive cough. No fever. He describes some substernal chest pain which occasionally radiates to his left arm is also worse with coughing and deep inspiration. Pain occurs at rest and is not affected by activity otherwise. Patient chronically sleeps in a recliner due to issues with his back. He has not had any nausea, vomiting or dysuria. He does feel he is constipated for the past few days. He was recently seen at [facility] in the emergency room and found to have a left lower extremity DVT. Patient was apparently started on xarelto, but reports he was not taking it due to cost and has only been taking aspirin. He has chronic wounds on his right foot for which he is followed at the wound care center in [place].  ED Course: In the emergency room, hemodynamics were noted to be stable. He was noted to be hypoxic on room air with oxygen saturations in the 80s, which improved with supplemental oxygen. CT scan of the chest was negative for pulmonary embolus, but did indicate possible pneumonia versus CHF. INR was noted to be greater than 10. Remainder of lab work was relatively unremarkable. EKG did not show any acute findings.  Review of Systems: As per HPI otherwise 10 point review of systems negative.    Past Medical History:  Diagnosis  Date  . Alcohol abuse    stop drinking since 01/2009  . Arthritis   . Asthma   . Atrial fibrillation (HCC)    off coumadin secondary to fall and subdural  hematoma   . B12 deficiency 9/10   started injection   . BPH (benign prostatic hyperplasia)   . Cancer (HCC) [facility]   BLADDER CANCER CELLS TX 2 MO AGO  . Carcinoma in situ of bladder   . Chronic kidney disease    RECENT UTI FEW MONTHS AGO TX WITH ANTIBIOTIC  . Compression fracture   . COPD (chronic obstructive pulmonary disease) (HCC)   . Diverticula of colon    few small scattered in the sigmoid colon  . Enlarged prostate   . External hemorrhoids 09/2014  . GERD (gastroesophageal reflux disease)   . H/O hiatal hernia   . Headache(784.0)   . Hypertension   . Nutcracker esophagus 2002   on EM  . Osteoporosis   . Peripheral edema   . Reflux   . Venous stasis    chronic     Past Surgical History:  Procedure Laterality Date  . BLADDER ASPIRATION     PROC FOR CANCER CELLS   . CATARACT EXTRACTION W/PHACO  07/30/2012   Procedure: CATARACT EXTRACTION PHACO AND INTRAOCULAR LENS PLACEMENT (IOC);  Surgeon: Susa Simmonds, MD;  Location: [facility] ORS;  Service: Ophthalmology;  Laterality: Left;  CDE:31.66  . CATARACT EXTRACTION W/PHACO Right 11/12/2012   Procedure: CATARACT EXTRACTION PHACO AND INTRAOCULAR LENS PLACEMENT (IOC);  Surgeon: Noralyn Pick  Lenord Carbo, MD;  Location: [facility] ORS;  Service: Ophthalmology;  Laterality: Right;  CDE 18.25  . CYSTOSCOPY  10/18/2011   Procedure: CYSTOSCOPY;  Surgeon: Anner Crete, MD;  Location: [facility] ORS;  Service: Urology;  Laterality: N/A;  . ESOPHAGOGASTRODUODENOSCOPY  02/11/2010   RMR: GE narrowing s/p dilation 56 & 58 Fr maloney otherwise normal  . ESOPHAGOGASTRODUODENOSCOPY N/A 10/01/2015   Dr.Rourk- mildly severe esophagitis, likely pill-induced, narrowed GE junction without fixed lesion found ?query achalasia, s/p maloney dilation, nomal stomach, normal second portion of the duodenum  . HERNIA REPAIR  12 YRS  AGO   RIGHT SIDE   . JOINT REPLACEMENT     RT HIP X2  (1 REPLACEMENT)  . LUNG BIOPSY  10 YRS AGO   BENIGN  . MALONEY DILATION N/A 10/01/2015   Procedure: [name] DILATION;  Surgeon: Corbin Ade, MD;  Location: [facility] ENDO SUITE;  Service: Endoscopy;  Laterality: N/A;  . PERIPHERAL VASCULAR CATHETERIZATION N/A 10/23/2015   Procedure: Abdominal Aortogram w/Lower Extremity;  Surgeon: Sherren Kerns, MD;  Location: [facility] INVASIVE CV LAB;  Service: Cardiovascular;  Laterality: N/A;  . RHINOPLASTY  1974     reports that he has never smoked. He quit smokeless tobacco use about 25 years ago. His smokeless tobacco use included Chew. He reports that he does not drink alcohol or use drugs.  Allergies  Allergen Reactions  . Oxycodone-Acetaminophen Itching  . Sulfonamide Derivatives Hives  . Tramadol Itching    Family History  Problem Relation Age of Onset  . Cancer Brother   . Hypertension Mother   . Diabetes Mother   . Heart attack Mother   . Heart disease Mother      Prior to Admission medications   Medication Sig Start Date End Date Taking? Authorizing Provider  alendronate (FOSAMAX) 70 MG tablet Take 1 tablet (70 mg total) by mouth every 7 (seven) days. Take with a full glass of water on an empty stomach. 04/22/13   Merlyn Albert, MD  aspirin 325 MG tablet Take 325 mg by mouth daily.    Historical Provider, MD  CALCIUM PO Take 1 tablet by mouth daily.    Historical Provider, MD  Cholecalciferol (VITAMIN D PO) Take 1 capsule by mouth daily.    Historical Provider, MD  DOCOSAHEXAENOIC ACID PO Take 1 g by mouth.    Historical Provider, MD  escitalopram (LEXAPRO) 10 MG tablet Take 1 tablet (10 mg total) by mouth daily. 08/24/16 08/24/17  Merlyn Albert, MD  furosemide (LASIX) 40 MG tablet Take 2 tablets every morning Patient taking differently: Take 80 mg by mouth daily as needed for fluid.  01/15/16   Babs Sciara, MD  lactulose (CHRONULAC) 10 GM/15ML solution Take one tablespoon qd prn  constipation 02/17/16   Merlyn Albert, MD  naproxen sodium (ANAPROX) 220 MG tablet Take 440 mg by mouth 2 (two) times daily with a meal.     Historical Provider, MD  oxyCODONE-acetaminophen (PERCOCET) 7.5-325 MG tablet Take 1 tablet by mouth every 4 (four) hours as needed for severe pain. 06/29/16   Merlyn Albert, MD  pantoprazole (PROTONIX) 40 MG tablet Take 1 tablet (40 mg total) by mouth daily. Patient not taking: Reported on 06/29/2016 09/10/15   Anice Paganini, NP  Rivaroxaban (XARELTO) 15 MG TABS tablet Take 1 tablet by mouth twice a day for 21 days. 09/21/16   Merlyn Albert, MD  rivaroxaban (XARELTO) 20 MG TABS tablet Take 1 tablet (20 mg  total) by mouth daily with supper. 09/21/16   Merlyn Albert, MD  sucralfate (CARAFATE) 1 g tablet DISSOLVE 1 TABLET IN 2 OZ WATER AND DRINK BEFORE MEALS AND AT BEDTIMEFOR 2 WEEKS. 02/04/16   Anice Paganini, NP  verapamil (CALAN-SR) 180 MG CR tablet Take 1 tablet (180 mg total) by mouth daily. 06/29/16   Merlyn Albert, MD    Physical Exam: Vitals:   10/04/16 0720 10/04/16 0730 10/04/16 0800 10/04/16 0830  BP:  (!) 149/81 (!) 129/57 (!) 119/58  Pulse: 71 73  70  Resp:      Temp:      SpO2: 100% 98% 100% 100%  Weight:      Height:        Constitutional: NAD, calm, comfortable Vitals:   10/04/16 0720 10/04/16 0730 10/04/16 0800 10/04/16 0830  BP:  (!) 149/81 (!) 129/57 (!) 119/58  Pulse: 71 73  70  Resp:      Temp:      SpO2: 100% 98% 100% 100%  Weight:      Height:       Eyes: PERRL, lids and conjunctivae normal ENMT: Mucous membranes are moist. Posterior pharynx clear of any exudate or lesions.Normal dentition.  Neck: normal, supple, no masses, no thyromegaly Respiratory: bilateral crackles. Normal respiratory effort. No accessory muscle use.  Cardiovascular: Regular rate and rhythm, no murmurs / rubs / gallops. 1+ extremity edema. 2+ pedal pulses. No carotid bruits.  Abdomen: no tenderness, no masses palpated. No hepatosplenomegaly. Bowel  sounds positive.  Musculoskeletal: no clubbing / cyanosis. No joint deformity upper and lower extremities. Good ROM, no contractures. Normal muscle tone.  Skin: wound on dorsal aspect of right foot, third digit of right foot with wound Neurologic: CN 2-12 grossly intact. Sensation intact, DTR normal. Strength 5/5 in all 4.  Psychiatric: Normal judgment and insight. Alert and oriented x 3. Normal mood.   Labs on Admission: I have personally reviewed following labs and imaging studies  CBC:  Recent Labs Lab 10/04/16 0450  WBC 11.4*  HGB 10.1*  HCT 30.9*  MCV 90.9  PLT 223   Basic Metabolic Panel:  Recent Labs Lab 10/04/16 0450  NA 136  K 3.9  CL 101  CO2 23  GLUCOSE 125*  BUN 45*  CREATININE 1.03  CALCIUM 8.7*   GFR: Estimated Creatinine Clearance: 59.9 mL/min (by C-G formula based on SCr of 1.03 mg/dL). Liver Function Tests: No results for input(s): AST, ALT, ALKPHOS, BILITOT, PROT, ALBUMIN in the last 168 hours. No results for input(s): LIPASE, AMYLASE in the last 168 hours. No results for input(s): AMMONIA in the last 168 hours. Coagulation Profile:  Recent Labs Lab 10/04/16 0450 10/04/16 0645  INR >10.00* >10.00*   Cardiac Enzymes:  Recent Labs Lab 10/04/16 0645  TROPONINI 0.03*   BNP (last 3 results) No results for input(s): PROBNP in the last 8760 hours. HbA1C: No results for input(s): HGBA1C in the last 72 hours. CBG: No results for input(s): GLUCAP in the last 168 hours. Lipid Profile: No results for input(s): CHOL, HDL, LDLCALC, TRIG, CHOLHDL, LDLDIRECT in the last 72 hours. Thyroid Function Tests: No results for input(s): TSH, T4TOTAL, FREET4, T3FREE, THYROIDAB in the last 72 hours. Anemia Panel: No results for input(s): VITAMINB12, FOLATE, FERRITIN, TIBC, IRON, RETICCTPCT in the last 72 hours. Urine analysis:    Component Value Date/Time   COLORURINE YELLOW 11/27/2015 1028   APPEARANCEUR CLEAR 11/27/2015 1028   LABSPEC >1.030 (H)  11/27/2015 1028  PHURINE 5.5 11/27/2015 1028   GLUCOSEU NEGATIVE 11/27/2015 1028   HGBUR NEGATIVE 11/27/2015 1028   BILIRUBINUR SMALL (A) 11/27/2015 1028   KETONESUR TRACE (A) 11/27/2015 1028   PROTEINUR TRACE (A) 11/27/2015 1028   UROBILINOGEN 0.2 02/21/2011 1842   NITRITE NEGATIVE 11/27/2015 1028   LEUKOCYTESUR NEGATIVE 11/27/2015 1028    Radiological Exams on Admission: Dg Chest 2 View  Result Date: 10/04/2016 CLINICAL DATA:  Acute onset of generalized chest pain, radiating to the left arm. Initial encounter. EXAM: CHEST  2 VIEW COMPARISON:  Chest radiograph performed 02/29/2016 FINDINGS: The lungs are well-aerated. Patchy bilateral airspace opacities raise concern for multifocal pneumonia, superimposed on chronic interstitial changes. There is no evidence of pleural effusion or pneumothorax. The heart is enlarged.  No acute osseous abnormalities are seen. IMPRESSION: 1. Patchy bilateral airspace opacities raise concern for multifocal pneumonia, superimposed on chronic interstitial changes. Underlying worsening chronic interstitial lung disease is a concern. 2. Cardiomegaly. Electronically Signed   By: Roanna Raider M.D.   On: 10/04/2016 05:35   Ct Angio Chest Pe W/cm &/or Wo Cm  Result Date: 10/04/2016 CLINICAL DATA:  Acute onset of shortness of breath and generalized chest pain. Recently diagnosed with deep venous thrombosis. EXAM: CT ANGIOGRAPHY CHEST WITH CONTRAST TECHNIQUE: Multidetector CT imaging of the chest was performed using the standard protocol during bolus administration of intravenous contrast. Multiplanar CT image reconstructions and MIPs were obtained to evaluate the vascular anatomy. CONTRAST:  100 mL of Isovue 370 IV contrast COMPARISON:  Chest radiograph performed earlier today at 4:58 a.m., and CT of the chest performed 12/13/2002 FINDINGS: Cardiovascular:  There is no evidence of pulmonary embolus. The heart is mildly enlarged, with biatrial enlargement. Diffuse coronary  artery calcifications are seen. Scattered calcification is seen about the thoracic aorta and proximal great vessels. Mediastinum/Nodes: A 1.7 cm azygoesophageal recess node is noted. A 2.1 cm right paratracheal node is noted. A 1.3 cm periaortic node is seen. No pericardial effusion is identified. The thyroid gland is unremarkable. No axillary lymphadenopathy is appreciated. Lungs/Pleura: Small bilateral pleural effusions are noted. Diffuse ground-glass airspace opacification is seen bilaterally. This may reflect pulmonary edema or pneumonia, though pulmonary alveolar proteinosis could have a similar appearance. No pneumothorax is seen. Upper Abdomen: The visualized portions of the liver and spleen are grossly unremarkable. Musculoskeletal: No acute osseous abnormalities are identified. There is chronic loss of height at vertebral bodies T5, T7 and T11. The visualized musculature is unremarkable in appearance. Review of the MIP images confirms the above findings. IMPRESSION: 1. No evidence of pulmonary embolus. 2. Small bilateral pleural effusions. Diffuse ground-glass airspace opacification noted bilaterally. This may reflect pulmonary edema or pneumonia, though pulmonary alveolar proteinosis could have a similar appearance. 3. Enlarged mediastinal nodes may reflect underlying infection. 4. Mild cardiomegaly, with biatrial enlargement. Diffuse coronary artery calcifications seen. 5. Chronic loss of height at vertebral bodies T5, T7 and T11. Electronically Signed   By: Roanna Raider M.D.   On: 10/04/2016 06:15    EKG: Independently reviewed. Sinus without acute changes  Assessment/Plan Active Problems:   COPD (chronic obstructive pulmonary disease) (HCC)   ESOPHAGEAL MOTILITY DISORDER   GERD   Essential hypertension, benign   Acute respiratory failure (HCC)   Left leg DVT (HCC)   CAP (community acquired pneumonia)   Acute CHF (HCC)   AF (paroxysmal atrial fibrillation) (HCC)   Acute respiratory  failure with hypoxia (HCC)    1. Acute respiratory failure with hypoxia. Currently on supplemental oxygen. Suspect  this may be related to an element of CHF and pneumonia. We'll try to wean off oxygen as tolerated.  2. Acute CHF. Check echocardiogram to assess ejection fraction. Patient admits to not taking his Lasix at home since it causes him to urinate frequently. Start him on low-dose lisinopril. Will avoid beta blockers at this time since his heart rate is in the 60s. Check cardiac markers.  3. Chest pain. Appears to be atypical. Clinical cardiac markers and monitor on telemetry.  4. Community acquired pneumonia. Start the patient on Rocephin and azithromycin. Continue pulmonary hygiene.  5. COPD . no evidence of wheezing at this time. Continue on bronchodilators.  6. Left leg DVT. Recently found at [facility] last week. I have discussed his care with his primary care physician, Dr. Gerda Diss, who reports that patient received a free month supply of Xarelto. It is unclear this is what is driving up his INR. He will need to be continued on anticoagulation. Dr. Gerda Diss reports that patient is no longer drinking alcohol. Will start back on xarelto once INR improved  7. supratherapeutic INR. Possibly related to Xarelto . patient received a dose of vitamin K. There does not appear to be evidence of ongoing bleeding. We'll recheck INR in a.m .  8. Paroxysmal atrial flutter. History of atrial flutter in the past, currently in sinus rhythm. Heart rate in the 60s. He was started on anticoagulation once INR issues have been addressed.  9. Right foot wounds. Patient is followed [place] care center [place]. Will consult wound care to advise further treatment hospital. Plan is likely follow-up at wound care center for further management.  DVT prophylaxis: possibly xarelto, INR supratherapeutic at this time Code Status: full code Family Communication: no family present Disposition Plan:  discharge home once improved Consults called:  Admission status: inpatient, telemetry   Joselyne Spake MD [facility] Pager [phone]  If 7PM-7AM, please contact night-coverage [email or web address] Password [facility]  10/04/2016, 9:13 AM

## 2016-10-04 NOTE — Progress Notes (Signed)
*  PRELIMINARY RESULTS* Echocardiogram 2D Echocardiogram has been performed.  Stacey Drain 10/04/2016, 3:05 PM

## 2016-10-04 NOTE — ED Notes (Signed)
CRITICAL VALUE ALERT  Critical value received:  INR > 10   Date of notification:  10/04/16  Time of notification:  0738  Critical value read back:Yes.    Nurse who received alert:  Docia Barrier   MD notified (1st page):  Dr. Lynelle Doctor

## 2016-10-05 DIAGNOSIS — R0902 Hypoxemia: Secondary | ICD-10-CM

## 2016-10-05 DIAGNOSIS — I5031 Acute diastolic (congestive) heart failure: Secondary | ICD-10-CM

## 2016-10-05 LAB — CBC
HCT: 29.1 % — ABNORMAL LOW (ref 39.0–52.0)
Hemoglobin: 9.6 g/dL — ABNORMAL LOW (ref 13.0–17.0)
MCH: 30 pg (ref 26.0–34.0)
MCHC: 33 g/dL (ref 30.0–36.0)
MCV: 90.9 fL (ref 78.0–100.0)
PLATELETS: 225 10*3/uL (ref 150–400)
RBC: 3.2 MIL/uL — ABNORMAL LOW (ref 4.22–5.81)
RDW: 14.6 % (ref 11.5–15.5)
WBC: 8.3 10*3/uL (ref 4.0–10.5)

## 2016-10-05 LAB — HIV ANTIBODY (ROUTINE TESTING W REFLEX): HIV SCREEN 4TH GENERATION: NONREACTIVE

## 2016-10-05 LAB — BASIC METABOLIC PANEL
Anion gap: 7 (ref 5–15)
BUN: 33 mg/dL — AB (ref 6–20)
CHLORIDE: 100 mmol/L — AB (ref 101–111)
CO2: 30 mmol/L (ref 22–32)
CREATININE: 0.94 mg/dL (ref 0.61–1.24)
Calcium: 8.6 mg/dL — ABNORMAL LOW (ref 8.9–10.3)
GFR calc Af Amer: >60 mL/min (ref >60)
GFR calc non Af Amer: >60 mL/min (ref >60)
Glucose, Bld: 106 mg/dL — ABNORMAL HIGH (ref 65–99)
Potassium: 4 mmol/L (ref 3.5–5.1)
Sodium: 137 mmol/L (ref 135–145)

## 2016-10-05 LAB — PROTIME-INR
INR: 2.93
Prothrombin Time: 31.2 seconds — ABNORMAL HIGH (ref 11.4–15.2)

## 2016-10-05 MED ORDER — OXYMETAZOLINE HCL 0.05 % NA SOLN
1.0000 | Freq: Two times a day (BID) | NASAL | Status: DC
  Administered 2016-10-05 – 2016-10-08 (×7): 1 via NASAL
  Filled 2016-10-05: qty 15

## 2016-10-05 MED ORDER — SALINE SPRAY 0.65 % NA SOLN
1.0000 | NASAL | Status: DC | PRN
  Administered 2016-10-05: 1 via NASAL
  Filled 2016-10-05: qty 44

## 2016-10-05 NOTE — Care Management Note (Signed)
Case Management Note  Patient Details  Name: RAYDON CHAPPUIS MRN: [id number] Date of Birth: 06-22-1933  Subjective/Objective:                  Pt admitted with resp failure. Pt is from home, lives with his wife. Pt is ind with ADL's at baseline. He uses a cane. He does not have oxygen at home. He goes to the wound center for his leg wounds. He has had HH in the past but does not remember the name of the company. Pt's son also does not remember who he has used. PT has recommended HH PT, pt is agreeable and has no preference of HH agency. [facility] able to take pt's referral. Pt and pt's son made aware and agreeable.   Action/Plan: Pt plans to return home with [facility] nursing PT through [facility]. [facility] rep, Jorja Loa Justis, will obtain pt info from chart and will be updated on DC date. Pt will need to wean from oxygen or have home O2 assessment.   Expected Discharge Date:    10/08/2016              Expected Discharge Plan:  Home w Home Health Services  In-House Referral:  NA  Discharge planning Services  CM Consult  Post Acute Care Choice:  Home Health Choice offered to:  Patient  HH Arranged:  RN, PT HH Agency:  [facility]  Status of Service:  In process, will continue to follow  Malcolm Metro, RN 10/05/2016, 1:45 PM

## 2016-10-05 NOTE — Progress Notes (Signed)
Physical Therapy Treatment Patient Details Name: [name] [name] MRN: [id number] DOB: Jul 28, 1932 Today's Date: 10/05/2016    History of Present Illness  83 y.o. male with medical history significant of  hypertension, COPD, alcohol abuse (stopped drinking in 2010), atrial fibrillation (was previously on Coumadin, but taken off due to fall and subdural hematoma). Patient presents to the hospital with complaints of shortness of breath. He reports feeling progressively short of breath for the past few days. He's had a productive cough. No fever. He describes some substernal chest pain which occasionally radiates to his left arm is also worse with coughing and deep inspiration. Pain occurs at rest and is not affected by activity otherwise. Patient chronically sleeps in a recliner due to issues with his back. He has not had any nausea, vomiting or dysuria. He does feel he is constipated for the past few days. He was recently seen at [facility] in the emergency room and found to have a left lower extremity DVT. Patient was apparently started on xarelto, but reports he was not taking it due to cost and has only been taking aspirin. He has chronic wounds on his right foot for which he is followed at the wound care center in [place].    PT Comments    Pt received sitting up in the straight back chair, and was agreeable to PT tx.  Pt was able to perform sit<>stand with supervision and RW.  He was able to ambulate 22ft with RW and min guard x 2 trials.  He required seated rest break due to fatigue.  At this point he is recommended to return home with HHPT.    Follow Up Recommendations  Home health PT     Equipment Recommendations  None recommended by PT    Recommendations for Other Services       Precautions / Restrictions Precautions Precautions: Fall Precaution Comments: 2 falls in the past 6 months - one where he got dizzy and hit his head on the floor.  Pt states he bled a lot with that one.   Required Braces or Orthoses:  (off loading shoe on the R foot. ) Restrictions Weight Bearing Restrictions: No    Mobility  Bed Mobility                  Transfers Overall transfer level: Modified independent Equipment used: Rolling walker (2 wheeled) Transfers: Sit to/from Stand Sit to Stand: Supervision            Ambulation/Gait Ambulation/Gait assistance: Min guard Ambulation Distance (Feet): 50 Feet (x 2 trials) Assistive device: Rolling walker (2 wheeled) Gait Pattern/deviations: Step-through pattern;Trunk flexed   Gait velocity interpretation: <1.8 ft/sec, indicative of risk for recurrent falls General Gait Details: Pt fatigues quickly with gait, and required 1 seated rest break.    Stairs            Wheelchair Mobility    Modified Rankin (Stroke Patients Only)       Balance Overall balance assessment: History of Falls;Needs assistance Sitting-balance support: Bilateral upper extremity supported;Feet supported Sitting balance-Leahy Scale: Good     Standing balance support: Bilateral upper extremity supported Standing balance-Leahy Scale: Fair                              Cognition Arousal/Alertness: Awake/alert Behavior During Therapy: WFL for tasks assessed/performed Overall Cognitive Status: Within Functional Limits for tasks assessed  Exercises      General Comments        Pertinent Vitals/Pain Pain Assessment:  (Pt c/o HA but does not rate)    Home Living                      Prior Function            PT Goals (current goals can now be found in the care plan section) Acute Rehab PT Goals Patient Stated Goal: To get stronger PT Goal Formulation: With patient Time For Goal Achievement: 10/19/16 Potential to Achieve Goals: Good Progress towards PT goals: Progressing toward goals    Frequency    Min 3X/week      PT Plan       Co-evaluation             End of Session Equipment Utilized During Treatment: Gait belt Activity Tolerance: Patient limited by fatigue Patient left: in chair;with call bell/phone within reach Nurse Communication: Mobility status PT Visit Diagnosis: Other abnormalities of gait and mobility (R26.89);Muscle weakness (generalized) (M62.81)     Time: 1191-4782 PT Time Calculation (min) (ACUTE ONLY): 20 min  Charges:  $Therapeutic Exercise: 8-22 mins                    G Codes:       Beth Swayze Kozuch, PT, DPT X: [phone]

## 2016-10-05 NOTE — Evaluation (Signed)
Physical Therapy Evaluation Patient Details Name: [name] [name] MRN: [id number] DOB: [birth year] Today's Date: 10/05/2016   History of Present Illness   83 y.o. male with medical history significant of  hypertension, COPD, alcohol abuse (stopped drinking in 2010), atrial fibrillation (was previously on Coumadin, but taken off due to fall and subdural hematoma). Patient presents to the hospital with complaints of shortness of breath. He reports feeling progressively short of breath for the past few days. He's had a productive cough. No fever. He describes some substernal chest pain which occasionally radiates to his left arm is also worse with coughing and deep inspiration. Pain occurs at rest and is not affected by activity otherwise. Patient chronically sleeps in a recliner due to issues with his back. He has not had any nausea, vomiting or dysuria. He does feel he is constipated for the past few days. He was recently seen at [facility] in the emergency room and found to have a left lower extremity DVT. Patient was apparently started on xarelto, but reports he was not taking it due to cost and has only been taking aspirin. He has chronic wounds on his right foot for which he is followed at the wound care center in [place].    Clinical Impression  Pt received in bed, and was agreeable to PT evaluation.  Pt is normally able to ambulate limited community distances with a cane.  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  Pt states that he is independent with dressing, bathing, and is still driving.  During PT evaluation he demonstrated ability to transfer bed<>chair with RW and off loading shoe on the right with min guard.  Further mobility was limited due to pt fatigue, and pt coughing up a quarter size amount of bloody mucous when his INR is >10.  His nurse was notified of this.  Pt is recommended for HHPT upon d/c.     Follow Up Recommendations Home health PT    Equipment  Recommendations  None recommended by PT    Recommendations for Other Services       Precautions / Restrictions Precautions Precautions: Fall Precaution Comments: 2 falls in the past 6 months - one where he got dizzy and hit his head on the floor.  Pt states he bled a lot with that one.  Required Braces or Orthoses:  (Off loading shoe on the right) Restrictions Weight Bearing Restrictions: No      Mobility  Bed Mobility Overal bed mobility: Modified Independent                Transfers Overall transfer level: Needs assistance Equipment used: Rolling walker (2 wheeled) Transfers: Sit to/from [employer] Sit to Stand: Min guard Stand pivot transfers: Min guard          Ambulation/Gait Ambulation/Gait assistance:  (NA due to fatigue and coughing up bloody mucous with INR >10)              Stairs            Wheelchair Mobility    Modified Rankin (Stroke Patients Only)       Balance Overall balance assessment: History of Falls;Needs assistance Sitting-balance support: Bilateral upper extremity supported;Feet supported Sitting balance-Leahy Scale: Good     Standing balance support: Bilateral upper extremity supported Standing balance-Leahy Scale: Fair  Pertinent Vitals/Pain Pain Assessment: No/denies pain    Home Living   Living Arrangements: Spouse/significant other (wife has dementia, and son comes by several days per week to check on them. ) Available Help at Discharge: Family;Available PRN/intermittently (son) Type of Home: House Home Access: Stairs to enter   [employer] of Steps: 1 Home Layout: One level Home Equipment: Cane - single point;Walker - 2 wheels;Bedside commode      Prior Function Level of Independence: Independent with assistive device(s)   Gait / Transfers Assistance Needed: uses cane for ambulation all the time.  Still driving.  Runs all the  errands.  Uses motorized cart around the grocery store.    ADL's / Homemaking Assistance Needed: independent.  Sponge baths due to R foot.    Comments: Pt cares for his wife who has dementia.  Pt does the cooking.       Hand Dominance        Extremity/Trunk Assessment   Upper Extremity Assessment Upper Extremity Assessment: Generalized weakness    Lower Extremity Assessment Lower Extremity Assessment: Generalized weakness       Communication   Communication: No difficulties  Cognition Arousal/Alertness: Awake/alert Behavior During Therapy: WFL for tasks assessed/performed Overall Cognitive Status: Within Functional Limits for tasks assessed                                        General Comments      Exercises     Assessment/Plan    PT Assessment Patient needs continued PT services  PT Problem List Decreased strength;Decreased activity tolerance;Decreased balance;Decreased mobility;Cardiopulmonary status limiting activity;Decreased skin integrity       PT Treatment Interventions DME instruction;Gait training;Functional mobility training;Therapeutic activities;Therapeutic exercise;Balance training;Patient/family education    PT Goals (Current goals can be found in the Care Plan section)  Acute Rehab PT Goals Patient Stated Goal: To get stronger PT Goal Formulation: With patient Time For Goal Achievement: 10/19/16 Potential to Achieve Goals: Good    Frequency Min 3X/week   Barriers to discharge Decreased caregiver support Pt is the caregiver for his wife who has dementia.      Co-evaluation               End of Session Equipment Utilized During Treatment: Gait belt Activity Tolerance: Patient limited by fatigue Patient left: in chair;with call bell/phone within reach Nurse Communication: Mobility status Misty Stanley, RN notified of mobility.  Mobility sheet left hanging in the room.  ) PT Visit Diagnosis: Other abnormalities of gait and  mobility (R26.89);Muscle weakness (generalized) (M62.81)    Time: 1500-1530 PT Time Calculation (min) (ACUTE ONLY): 30 min   Charges:   PT Evaluation $PT Eval Low Complexity: 1 Procedure PT Treatments $Therapeutic Activity: 8-22 mins   PT G Codes:   PT G-Codes **NOT FOR INPATIENT CLASS** Functional Assessment Tool Used: AM-PAC 6 Clicks Basic Mobility;Clinical judgement Functional Limitation: Mobility: Walking and moving around Mobility: Walking and Moving Around Current Status (Z6109): At least 20 percent but less than 40 percent impaired, limited or restricted Mobility: Walking and Moving Around Goal Status [phone]): At least 1 percent but less than 20 percent impaired, limited or restricted    Beth Shelva Hetzer, PT, DPT X: [phone]

## 2016-10-05 NOTE — Progress Notes (Signed)
PROGRESS NOTE    [name] [name]  ZOX:[id number] DOB: 09-Oct-1932 DOA: 10/04/2016 PCP: Laural Golden (Inactive)   Brief Narrative:  83 year old male with past medical history of hypertension, COPD, alcohol use, atrial fibrillation but was not on Coumadin or anticoagulation in the past due to falls and subdural hematoma came with the complaints of shortness of breath. He was found to be in congestive heart failure and have pneumonia therefore started on diuresis and antibiotics.   Assessment & Plan:   Active Problems:   COPD (chronic obstructive pulmonary disease) (HCC)   ESOPHAGEAL MOTILITY DISORDER   GERD   Essential hypertension, benign   Acute respiratory failure (HCC)   Left leg DVT (HCC)   CAP (community acquired pneumonia)   Acute CHF (HCC)   AF (paroxysmal atrial fibrillation) (HCC)   Acute respiratory failure with hypoxia (HCC)   acute respiratory failure with hypoxia -Provide supplemental oxygen. This is multifactorial in nature from CHF and pneumonia  Acute diastolic CHF, class III -Echocardiogram done shows ejection fraction 60-65 percent with no regional wall motion abnormality. Has some dilated cardiomyopathic changes -Continue Lasix 40 mg IV twice daily -Already on aspirin. Lisinopril started yesterday 2.5 mg daily -Beta blockers on hold due to bradycardia yesterday. We'll still hold it due to borderline low blood pressure -Cardiac enzymes have been negative  Community acquired pneumonia -Continue Rocephin and azithromycin  Left lower extremity DVT -Was diagnosed last week at [facility]. He was supposed to be on Xarelto. Case was discussed by the admitting physician with his PCP. It was on hold due to supratherapeutic INR and 1 dose of vitamin K was given. -INR has improved in its 2.93 today. We will recheck it again tomorrow and likely can resume Xarelto  COPD -Stable at this time. Continue home bronchodilators    paroxysmal atrial  fibrillation -Rate control -Continue anticoagulation with Xarelto  Chronic right lower extremity wounds on his foot -Wound care team to follow  DVT prophylaxis: Elevated INR but will be on Xarelto likely from tomorrow.  Code Status: Full  Family Communication:  None present at the bedside.  Disposition Plan: Cont tele, inpatient stay   Consultants:   WOC  Procedures:   None   Antimicrobials:   Rocephin 4/10  Azithromycin 4/10   Subjective: Patient states he feels a little better than yesterday but still feels short of breath with moving around the room. No other complaints.   Objective: Vitals:   10/04/16 1937 10/04/16 2119 10/05/16 0527 10/05/16 0730  BP:  (!) 106/52 110/63   Pulse:  74 68   Resp:   20   Temp:  98.4 F (36.9 C) 98.2 F (36.8 C)   TempSrc:  Oral Oral   SpO2: 96% 98% 97% 96%  Weight:   88.4 kg (194 lb 15.2 oz)   Height:        Intake/Output Summary (Last 24 hours) at 10/05/16 1006 Last data filed at 10/05/16 0807  Gross per 24 hour  Intake             1688 ml  Output              700 ml  Net              988 ml   Filed Weights   10/04/16 0443 10/05/16 0527  Weight: 88.5 kg (195 lb) 88.4 kg (194 lb 15.2 oz)    Examination:  General exam: Appears calm and comfortable  Respiratory system: Bilateral crackles heard  midway up his lung fields CV system: S1 & S2 heard, RRR. No JVD, murmurs, rubs, gallops or clicks. No pedal edema. Gastrointestinal system: Abdomen is nondistended, soft and nontender. No organomegaly or masses felt. Normal bowel sounds heard. Central nervous system: Alert and oriented. No focal neurological deficits. Extremities: Symmetric 5 x 5 power. Chronic LE  Foot wounds, dressing in place.  Skin: No rashes, lesions or ulcers Psychiatry: Judgement and insight appear normal. Mood & affect appropriate.     Data Reviewed:   CBC:  Recent Labs Lab 10/04/16 0450 10/05/16 0541  WBC 11.4* 8.3  HGB 10.1* 9.6*  HCT  30.9* 29.1*  MCV 90.9 90.9  PLT 223 225   Basic Metabolic Panel:  Recent Labs Lab 10/04/16 0450 10/05/16 0541  NA 136 137  K 3.9 4.0  CL 101 100*  CO2 23 30  GLUCOSE 125* 106*  BUN 45* 33*  CREATININE 1.03 0.94  CALCIUM 8.7* 8.6*   GFR: Estimated Creatinine Clearance: 65.6 mL/min (by C-G formula based on SCr of 0.94 mg/dL). Liver Function Tests: No results for input(s): AST, ALT, ALKPHOS, BILITOT, PROT, ALBUMIN in the last 168 hours. No results for input(s): LIPASE, AMYLASE in the last 168 hours. No results for input(s): AMMONIA in the last 168 hours. Coagulation Profile:  Recent Labs Lab 10/04/16 0450 10/04/16 0645 10/05/16 0541  INR >10.00* >10.00* 2.93   Cardiac Enzymes:  Recent Labs Lab 10/04/16 0645 10/04/16 1044 10/04/16 1609 10/04/16 2226  TROPONINI 0.03* 0.03* 0.03* 0.03*   BNP (last 3 results) No results for input(s): PROBNP in the last 8760 hours. HbA1C: No results for input(s): HGBA1C in the last 72 hours. CBG: No results for input(s): GLUCAP in the last 168 hours. Lipid Profile: No results for input(s): CHOL, HDL, LDLCALC, TRIG, CHOLHDL, LDLDIRECT in the last 72 hours. Thyroid Function Tests: No results for input(s): TSH, T4TOTAL, FREET4, T3FREE, THYROIDAB in the last 72 hours. Anemia Panel: No results for input(s): VITAMINB12, FOLATE, FERRITIN, TIBC, IRON, RETICCTPCT in the last 72 hours. Sepsis Labs:  Recent Labs Lab 10/04/16 0645 10/04/16 0921  LATICACIDVEN 1.0 0.8    Recent Results (from the past 240 hour(s))  Culture, blood (routine x 2) Call MD if unable to obtain prior to antibiotics being given     Status: None (Preliminary result)   Collection Time: 10/04/16  6:45 AM  Result Value Ref Range Status   Specimen Description BLOOD LEFT ARM  Final   Special Requests   Final    BOTTLES DRAWN AEROBIC AND ANAEROBIC Blood Culture adequate volume   Culture PENDING  Incomplete   Report Status PENDING  Incomplete  Culture, blood  (routine x 2) Call MD if unable to obtain prior to antibiotics being given     Status: None (Preliminary result)   Collection Time: 10/04/16 10:53 AM  Result Value Ref Range Status   Specimen Description LEFT ANTECUBITAL  Final   Special Requests   Final    BOTTLES DRAWN AEROBIC AND ANAEROBIC Blood Culture adequate volume   Culture PENDING  Incomplete   Report Status PENDING  Incomplete         Radiology Studies: Dg Chest 2 View  Result Date: 10/04/2016 CLINICAL DATA:  Acute onset of generalized chest pain, radiating to the left arm. Initial encounter. EXAM: CHEST  2 VIEW COMPARISON:  Chest radiograph performed 02/29/2016 FINDINGS: The lungs are well-aerated. Patchy bilateral airspace opacities raise concern for multifocal pneumonia, superimposed on chronic interstitial changes. There is no evidence of pleural  effusion or pneumothorax. The heart is enlarged.  No acute osseous abnormalities are seen. IMPRESSION: 1. Patchy bilateral airspace opacities raise concern for multifocal pneumonia, superimposed on chronic interstitial changes. Underlying worsening chronic interstitial lung disease is a concern. 2. Cardiomegaly. Electronically Signed   By: Roanna Raider M.D.   On: 10/04/2016 05:35   Ct Angio Chest Pe W/cm &/or Wo Cm  Result Date: 10/04/2016 CLINICAL DATA:  Acute onset of shortness of breath and generalized chest pain. Recently diagnosed with deep venous thrombosis. EXAM: CT ANGIOGRAPHY CHEST WITH CONTRAST TECHNIQUE: Multidetector CT imaging of the chest was performed using the standard protocol during bolus administration of intravenous contrast. Multiplanar CT image reconstructions and MIPs were obtained to evaluate the vascular anatomy. CONTRAST:  100 mL of Isovue 370 IV contrast COMPARISON:  Chest radiograph performed earlier today at 4:58 a.m., and CT of the chest performed 12/13/2002 FINDINGS: Cardiovascular:  There is no evidence of pulmonary embolus. The heart is mildly enlarged,  with biatrial enlargement. Diffuse coronary artery calcifications are seen. Scattered calcification is seen about the thoracic aorta and proximal great vessels. Mediastinum/Nodes: A 1.7 cm azygoesophageal recess node is noted. A 2.1 cm right paratracheal node is noted. A 1.3 cm periaortic node is seen. No pericardial effusion is identified. The thyroid gland is unremarkable. No axillary lymphadenopathy is appreciated. Lungs/Pleura: Small bilateral pleural effusions are noted. Diffuse ground-glass airspace opacification is seen bilaterally. This may reflect pulmonary edema or pneumonia, though pulmonary alveolar proteinosis could have a similar appearance. No pneumothorax is seen. Upper Abdomen: The visualized portions of the liver and spleen are grossly unremarkable. Musculoskeletal: No acute osseous abnormalities are identified. There is chronic loss of height at vertebral bodies T5, T7 and T11. The visualized musculature is unremarkable in appearance. Review of the MIP images confirms the above findings. IMPRESSION: 1. No evidence of pulmonary embolus. 2. Small bilateral pleural effusions. Diffuse ground-glass airspace opacification noted bilaterally. This may reflect pulmonary edema or pneumonia, though pulmonary alveolar proteinosis could have a similar appearance. 3. Enlarged mediastinal nodes may reflect underlying infection. 4. Mild cardiomegaly, with biatrial enlargement. Diffuse coronary artery calcifications seen. 5. Chronic loss of height at vertebral bodies T5, T7 and T11. Electronically Signed   By: Roanna Raider M.D.   On: 10/04/2016 06:15        Scheduled Meds: . aspirin  325 mg Oral Daily  . azithromycin  500 mg Intravenous Q24H  . cefTRIAXone (ROCEPHIN)  IV  1 g Intravenous Q24H  . escitalopram  10 mg Oral Daily  . furosemide  40 mg Intravenous BID  . guaiFENesin  1,200 mg Oral BID  . ipratropium-albuterol  3 mL Nebulization TID  . lisinopril  2.5 mg Oral Daily  . oxymetazoline  1  spray Each Nare BID  . pantoprazole  40 mg Oral Daily  . sodium chloride flush  3 mL Intravenous Q12H  . verapamil  180 mg Oral Daily   Continuous Infusions:   LOS: 1 day    Time spent: 35 mins     Ankit Joline Maxcy, MD [facility] Pager [phone]   If 7PM-7AM, please contact night-coverage [email or web address] Password [email or web address] 10/05/2016, 10:06 AM

## 2016-10-05 NOTE — Progress Notes (Signed)
Pt o2 weaned down to 2lpm cann due to pt don't wear o2 at home and pt complains of o2 drying nose out even with humidification applied

## 2016-10-06 DIAGNOSIS — I5031 Acute diastolic (congestive) heart failure: Secondary | ICD-10-CM

## 2016-10-06 DIAGNOSIS — L8990 Pressure ulcer of unspecified site, unspecified stage: Secondary | ICD-10-CM | POA: Insufficient documentation

## 2016-10-06 LAB — BASIC METABOLIC PANEL
ANION GAP: 7 (ref 5–15)
BUN: 38 mg/dL — ABNORMAL HIGH (ref 6–20)
CALCIUM: 8.6 mg/dL — AB (ref 8.9–10.3)
CHLORIDE: 98 mmol/L — AB (ref 101–111)
CO2: 31 mmol/L (ref 22–32)
Creatinine, Ser: 0.95 mg/dL (ref 0.61–1.24)
GFR calc non Af Amer: >60 mL/min (ref >60)
GLUCOSE: 103 mg/dL — AB (ref 65–99)
POTASSIUM: 4.3 mmol/L (ref 3.5–5.1)
Sodium: 136 mmol/L (ref 135–145)

## 2016-10-06 LAB — PROTIME-INR
INR: 1.44
Prothrombin Time: 17.7 seconds — ABNORMAL HIGH (ref 11.4–15.2)

## 2016-10-06 MED ORDER — SENNA 8.6 MG PO TABS
1.0000 | ORAL_TABLET | Freq: Every day | ORAL | Status: DC
  Administered 2016-10-06 – 2016-10-07 (×2): 8.6 mg via ORAL
  Filled 2016-10-06 (×2): qty 1

## 2016-10-06 MED ORDER — POLYETHYLENE GLYCOL 3350 17 G PO PACK
17.0000 g | PACK | Freq: Two times a day (BID) | ORAL | Status: DC
  Administered 2016-10-06 – 2016-10-08 (×5): 17 g via ORAL
  Filled 2016-10-06 (×5): qty 1

## 2016-10-06 MED ORDER — RIVAROXABAN 20 MG PO TABS
20.0000 mg | ORAL_TABLET | Freq: Every day | ORAL | Status: DC
  Administered 2016-10-06 – 2016-10-07 (×2): 20 mg via ORAL
  Filled 2016-10-06 (×2): qty 1

## 2016-10-06 MED ORDER — MILK AND MOLASSES ENEMA
1.0000 | Freq: Once | RECTAL | Status: AC
  Administered 2016-10-06: 250 mL via RECTAL

## 2016-10-06 MED ORDER — BISACODYL 10 MG RE SUPP: 10.0000 mg | Freq: Every day | RECTAL | Status: DC | PRN

## 2016-10-06 NOTE — Plan of Care (Signed)
Problem: Pain Managment: Goal: General experience of comfort will improve Outcome: Progressing Patient's pain is being treated adequately and patient is comfortable  Problem: Skin Integrity: Goal: Risk for impaired skin integrity will decrease Outcome: Progressing Patient's dressing changes are being completed per order and appropriate shoes are being worn at all times

## 2016-10-06 NOTE — Progress Notes (Signed)
Pt is worried about the constant use of the santyl ointment and how expensive it is. Pt told RN she was using too much and prefers if all RN's doing dressing change use a very small amount. RN acknowledged patient's wishes and will pass information on to next shift. Pt also exclaims dressing change process is not the correct way, even though RN followed wound care nurse instructions. Informed Pt of wound care instructions and reminded him that he could speak with his doctor further about the situation.  Dressing change was completed using small amount of santyl and minimum amount of kerlix per patient's request.  Genelle Bal, RN

## 2016-10-06 NOTE — Progress Notes (Signed)
PROGRESS NOTE  [name] [name] ZOX:[id number] DOB: 09-28-1932 DOA: 10/04/2016 PCP: Laural Golden (Inactive)  Brief Narrative: 83 year old man PMH COPD, atrial fibrillation (previously on warfarin but stopped secondary to fall with subdural hematoma), alcohol abuse (quit 2010), presented with progressive shortness of breath, substernal chest pain with radiation to the left arm, recent diagnosis of DVT at [facility]. Admitted for acute hypoxic respiratory failure, acute CHF, chest pain, pneumonia, supratherapeutic INR.  Assessment/Plan 1. Acute hypoxic respiratory failure secondary to diastolic heart failure, pneumonia.  Appears to be stable. Wean oxygen as tolerated.  2. Acute diastolic CHF. Normal LVEF, no regional wall motion abnormality by echocardiogram. Lisinopril added. Beta blocker on hold secondary to bradycardia. Troponins flat, minimally elevated.  I/O nearly balanced. Continue Lasix twice a day, reassess in a.m.  3. Chest pain, troponins without significant elevation, echocardiogram reassuring, ACS ruled out. Secondary to acute diastolic heart failure and pneumonia. CT chest negative for PE.   No further evaluation suggested  4. Pneumonia, bilateral. Afebrile. Appears to be improving.   Continue empiric antibiotics.  5. Supratherapeutic INR secondary to Xarelto. Resolved. Not unexpected on this medication.  6. Left lower extremity DVT diagnosed prior to admission  Resume Xarelto.  7. Right foot wounds, followed by wound care center in [place] 8. Atrial fibrillation. Resume Xarelto. 9. COPD. Appears stable.  10. Alcohol abuse, quit 2010 11. CT suggested the possibility of chronic interstitial lung disease 12. Constipation. Aggressive bowel regimen.  DVT prophylaxis: Xarelto Code Status: full Family Communication: none Disposition Plan: HH PT    Brendia Sacks, MD  [facility] Direct contact: [phone] --Via amion app OR  --[email or web address];  password [email or web address]  7PM-7AM contact night coverage as above 10/06/2016, 6:39 PM  LOS: 2 days   Consultants:    Procedures:  2-d echo Study Conclusions  - Left ventricle: The cavity size was normal. There was moderate   concentric hypertrophy. Systolic function was normal. The   estimated ejection fraction was in the range of 60% to 65%. Wall   motion was normal; there were no regional wall motion   abnormalities. The study was not technically sufficient to allow   evaluation of LV diastolic dysfunction due to atrial   fibrillation. - Aortic valve: Moderately to severely calcified annulus.   [place]. There was trivial regurgitation. - Aorta: Aortic root dimension: 41 mm (ED). - Aortic root: Mild aortic root dilatation. - Mitral valve: Mildly calcified annulus. - Left atrium: The atrium was severely dilated. - Right ventricle: The cavity size was mildly to moderately   dilated. Systolic function was mildly reduced. - Right atrium: The atrium was moderately dilated. - Tricuspid valve: There was mild regurgitation. - Pulmonic valve: There was mild regurgitation. - Pulmonary arteries: PA peak pressure: 41 mm Hg (S). - Systemic veins: IVC was dilated with normal respiratory   variation. Estimated CVP 8 mmHg.  Antimicrobials:  Ceftriaxone 4/9 >>  Azithromycin 4/10 >>  Interval history/Subjective: Overall feeling better but does complain of constipation. Breathing better.  Objective: Vitals:   10/06/16 0621 10/06/16 0742 10/06/16 1409 10/06/16 1424  BP: (!) 124/51  (!) 112/43   Pulse: 67  (!) 49   Resp:   18   Temp: 97.6 F (36.4 C)  98.3 F (36.8 C)   TempSrc: Oral  Oral   SpO2: 96% 92% 91% 98%  Weight: 88.3 kg (194 lb 10 oz)     Height:        Intake/Output Summary (Last 24 hours) at 10/06/16 1839 Last  data filed at 10/06/16 1721  Gross per 24 hour  Intake              655 ml  Output             1650 ml  Net             -995 ml     Filed Weights   10/04/16  0443 10/05/16 0527 10/06/16 0621  Weight: 88.5 kg (195 lb) 88.4 kg (194 lb 15.2 oz) 88.3 kg (194 lb 10 oz)    Exam:    Constitutional:Appears calm, comfortable, sitting in chair Cardiovascular: irregular rhythm, normal rate. No murmur, rub or gallop. 1+ bilateral lower extremity edema. Respiratory: posterior rales. No rhonchi or wheezes. Normal respiratory effort. Speaks in full sentences. Psychiatric: Grossly normal mood and affect. Speech fluent and appropriate.  I have personally reviewed following labs and imaging studies:  Urine output 2050, -227since admission  BMP unremarkable  Scheduled Meds: . aspirin  325 mg Oral Daily  . azithromycin  500 mg Intravenous Q24H  . cefTRIAXone (ROCEPHIN)  IV  1 g Intravenous Q24H  . escitalopram  10 mg Oral Daily  . furosemide  40 mg Intravenous BID  . guaiFENesin  1,200 mg Oral BID  . ipratropium-albuterol  3 mL Nebulization TID  . lisinopril  2.5 mg Oral Daily  . oxymetazoline  1 spray Each Nare BID  . pantoprazole  40 mg Oral Daily  . polyethylene glycol  17 g Oral BID  . rivaroxaban  20 mg Oral Q supper  . senna  1 tablet Oral QHS  . sodium chloride flush  3 mL Intravenous Q12H  . verapamil  180 mg Oral Daily   Continuous Infusions:  Active Problems:   COPD (chronic obstructive pulmonary disease) (HCC)   Left leg DVT (HCC)   CAP (community acquired pneumonia)   AF (paroxysmal atrial fibrillation) (HCC)   Acute respiratory failure with hypoxia (HCC)   Pressure injury of skin   Acute diastolic CHF (congestive heart failure) (HCC)   LOS: 2 days

## 2016-10-07 LAB — BASIC METABOLIC PANEL
Anion gap: 8 (ref 5–15)
BUN: 40 mg/dL — AB (ref 6–20)
CO2: 30 mmol/L (ref 22–32)
CREATININE: 0.88 mg/dL (ref 0.61–1.24)
Calcium: 8.5 mg/dL — ABNORMAL LOW (ref 8.9–10.3)
Chloride: 96 mmol/L — ABNORMAL LOW (ref 101–111)
GFR calc Af Amer: >60 mL/min (ref >60)
GLUCOSE: 99 mg/dL (ref 65–99)
POTASSIUM: 3.5 mmol/L (ref 3.5–5.1)
Sodium: 134 mmol/L — ABNORMAL LOW (ref 135–145)

## 2016-10-07 MED ORDER — AZITHROMYCIN 250 MG PO TABS
500.0000 mg | ORAL_TABLET | Freq: Every day | ORAL | Status: DC
  Administered 2016-10-08: 500 mg via ORAL
  Filled 2016-10-07: qty 2

## 2016-10-07 MED ORDER — TRAZODONE HCL 50 MG PO TABS
50.0000 mg | ORAL_TABLET | Freq: Every evening | ORAL | Status: DC | PRN
  Administered 2016-10-08: 50 mg via ORAL
  Filled 2016-10-07: qty 1

## 2016-10-07 MED ORDER — ORAL CARE MOUTH RINSE
15.0000 mL | Freq: Two times a day (BID) | OROMUCOSAL | Status: DC
  Administered 2016-10-07: 15 mL via OROMUCOSAL

## 2016-10-07 NOTE — Discharge Instructions (Signed)
Information on my medicine - XARELTO (rivaroxaban)  This medication education was reviewed with me or my healthcare representative as part of my discharge preparation.  The pharmacist that spoke with me during my hospital stay was:  Mady Gemma, [facility]  WHY WAS [name] [name] PRESCRIBED FOR YOU? Xarelto was prescribed to treat blood clots that may have been found in the veins of your legs (deep vein thrombosis) or in your lungs (pulmonary embolism) and to reduce the risk of them occurring again.  What do you need to know about Xarelto? Xarelto 20 mg tablet taken ONCE A DAY with your evening meal.  DO NOT stop taking Xarelto without talking to the health care provider who prescribed the medication.  Refill your prescription for 20 mg tablets before you run out.  After discharge, you should have regular check-up appointments with your healthcare provider that is prescribing your Xarelto.  In the future your dose may need to be changed if your kidney function changes by a significant amount.  What do you do if you miss a dose? If you are taking Xarelto ONCE DAILY and you miss a dose, take it as soon as you remember on the same day then continue your regularly scheduled once daily regimen the next day. Do not take two doses of Xarelto at the same time.   Important Safety Information Xarelto is a blood thinner medicine that can cause bleeding. You should call your healthcare provider right away if you experience any of the following: ? Bleeding from an injury or your nose that does not stop. ? Unusual colored urine (red or dark brown) or unusual colored stools (red or black). ? Unusual bruising for unknown reasons. ? A serious fall or if you hit your head (even if there is no bleeding).  Some medicines may interact with Xarelto and might increase your risk of bleeding while on Xarelto. To help avoid this, consult your healthcare provider or pharmacist prior to using any new prescription or  non-prescription medications, including herbals, vitamins, non-steroidal anti-inflammatory drugs (NSAIDs) and supplements.  This website has more information on Xarelto: [email or web address].br.

## 2016-10-07 NOTE — Progress Notes (Signed)
SATURATION QUALIFICATIONS: (This note is used to comply with regulatory documentation for home oxygen)  Patient Saturations on Room Air at Rest = 98%  Patient Saturations on Room Air while Ambulating = 98%  Patient Saturations on  Liters of oxygen while Ambulating = %  Please briefly explain why patient needs home oxygen: Patient did not require any oxygen while ambulating

## 2016-10-07 NOTE — Progress Notes (Signed)
PHARMACIST - PHYSICIAN COMMUNICATION DR:   TRH CONCERNING: Antibiotic IV to Oral Route Change Policy  RECOMMENDATION: This patient is receiving Azithromycin by the intravenous route.  Based on criteria approved by the Pharmacy and Therapeutics Committee, the antibiotic(s) is/are being converted to the equivalent oral dose form(s).   DESCRIPTION: These criteria include:  Patient being treated for a respiratory tract infection, urinary tract infection, cellulitis or clostridium difficile associated diarrhea if on metronidazole  The patient is not neutropenic and does not exhibit a GI malabsorption state  The patient is eating (either orally or via tube) and/or has been taking other orally administered medications for a least 24 hours  The patient is improving clinically and has a Tmax < 100.5  If you have questions about this conversion, please contact the Pharmacy Department    [phone] )  Jeani Hawking   [phone] )  [facility]   [phone] )  [facility]   [phone] )  [facility]   [phone] )  [facility]  Mady Gemma, [facility]  10/07/2016 4:14 PM

## 2016-10-07 NOTE — Progress Notes (Signed)
PROGRESS NOTE  [name] [name]:[id number] DOB: 10-25-1932 DOA: 10/04/2016 PCP: Laural Golden (Inactive)  Brief Narrative: 83 year old man PMH COPD, atrial fibrillation (previously on warfarin but stopped secondary to fall with subdural hematoma), alcohol abuse (quit 2010), presented with progressive shortness of breath, substernal chest pain with radiation to the left arm, recent diagnosis of DVT at [facility]. Admitted for acute hypoxic respiratory failure, acute CHF, chest pain, pneumonia, supratherapeutic INR.  Assessment/Plan 1. Acute hypoxic respiratory failure secondary to diastolic heart failure, pneumonia.  Appears to be improving. Wean oxygen as tolerated.  2. Acute diastolic CHF. Normal LVEF, no regional wall motion abnormality by echocardiogram. Lisinopril added. Beta blocker on hold secondary to bradycardia. Troponins flat, minimally elevated.  I/O nearly balanced. Continue Lasix twice a day, reassess in a.m.  3. Chest pain, troponins without significant elevation, echocardiogram reassuring, ACS ruled out. Secondary to acute diastolic heart failure and pneumonia. CT chest negative for PE.   No further evaluation suggested  4. Pneumonia, bilateral. Continues to improve.  Wean oxygen as tolerated.  Continue empiric antibiotics.  5. High INR secondary to Xarelto. Not unexpected on this medication. No bleeding. No further evaluation suggested.  6. Left lower extremity DVT diagnosed prior to admission  Continue Xarelto. However, discussed with pharmacy, patient does have some confusion surrounding this medication. We'll discuss proper administration with family. Will discuss with PCP.  7. Right foot wounds, followed by wound care center in [place]. Follow-up as an outpatient. 8. Atrial fibrillation. Continue Xarelto. 9. COPD. appears stable. 10. CT suggested the possibility of chronic interstitial lung disease. Follow-up as an outpatient. 11. Constipation.  Resolving with bowel regimen.  DVT prophylaxis: Xarelto Code Status: full Family Communication: none Disposition Plan: HH PT    Brendia Sacks, MD  [facility] Direct contact: [phone] --Via amion app OR  --[email or web address]; password [email or web address]  7PM-7AM contact night coverage as above 10/07/2016, 4:20 PM  LOS: 3 days   Consultants:    Procedures:  2-d echo Study Conclusions  - Left ventricle: The cavity size was normal. There was moderate   concentric hypertrophy. Systolic function was normal. The   estimated ejection fraction was in the range of 60% to 65%. Wall   motion was normal; there were no regional wall motion   abnormalities. The study was not technically sufficient to allow   evaluation of LV diastolic dysfunction due to atrial   fibrillation. - Aortic valve: Moderately to severely calcified annulus.   Trileaflet. There was trivial regurgitation. - Aorta: Aortic root dimension: 41 mm (ED). - Aortic root: Mild aortic root dilatation. - Mitral valve: Mildly calcified annulus. - Left atrium: The atrium was severely dilated. - Right ventricle: The cavity size was mildly to moderately   dilated. Systolic function was mildly reduced. - Right atrium: The atrium was moderately dilated. - Tricuspid valve: There was mild regurgitation. - Pulmonic valve: There was mild regurgitation. - Pulmonary arteries: PA peak pressure: 41 mm Hg (S). - Systemic veins: IVC was dilated with normal respiratory   variation. Estimated CVP 8 mmHg.  Antimicrobials:  Ceftriaxone 4/9 >>  Azithromycin 4/10 >>  Interval history/Subjective: Continues to feel better overall. Breathing okay. Did have a bowel movement yesterday. Lower extremity edema is about at baseline. Poor appetite ongoing.  Objective: Vitals:   10/06/16 2151 10/07/16 0657 10/07/16 0803 10/07/16 1411  BP: (!) 108/41 (!) 109/59  (!) 107/45  Pulse: (!) 57 80  (!) 108  Resp: Temp: 98.3 F (36.8 C)  97.5  F (36.4 C)  97.4 F (36.3 C)  TempSrc: Oral Oral  Oral  SpO2: 98% 95% 96% 94%  Weight:  85.5 kg (188 lb 6.4 oz)    Height:        Intake/Output Summary (Last 24 hours) at 10/07/16 1620 Last data filed at 10/07/16 1412  Gross per 24 hour  Intake             1513 ml  Output              750 ml  Net              763 ml     Filed Weights   10/05/16 0527 10/06/16 0621 10/07/16 0657  Weight: 88.4 kg (194 lb 15.2 oz) 88.3 kg (194 lb 10 oz) 85.5 kg (188 lb 6.4 oz)    Exam: Constitutional: Appears calm, comfortable. Cardiovascular: Regular rate and rhythm. No murmur, rub or gallop. Bilateral lower extremity edema 1+. Respiratory: Clear to auscultation bilaterally. No wheezes, rales or rhonchi. Normal respiratory effort.  I have personally reviewed following labs and imaging studies:  Urine output 1450. I/O -784 since admission.  Potassium within normal limits. BUN without significant change, 40. Creatinine stable 0.88.  Scheduled Meds: . aspirin  325 mg Oral Daily  . [START ON 10/08/2016] azithromycin  500 mg Oral Daily  . cefTRIAXone (ROCEPHIN)  IV  1 g Intravenous Q24H  . escitalopram  10 mg Oral Daily  . furosemide  40 mg Intravenous BID  . guaiFENesin  1,200 mg Oral BID  . ipratropium-albuterol  3 mL Nebulization TID  . lisinopril  2.5 mg Oral Daily  . mouth rinse  15 mL Mouth Rinse BID  . oxymetazoline  1 spray Each Nare BID  . pantoprazole  40 mg Oral Daily  . polyethylene glycol  17 g Oral BID  . rivaroxaban  20 mg Oral Q supper  . senna  1 tablet Oral QHS  . sodium chloride flush  3 mL Intravenous Q12H  . verapamil  180 mg Oral Daily   Continuous Infusions:  Active Problems:   COPD (chronic obstructive pulmonary disease) (HCC)   Left leg DVT (HCC)   CAP (community acquired pneumonia)   AF (paroxysmal atrial fibrillation) (HCC)   Acute respiratory failure with hypoxia (HCC)   Pressure injury of skin   Acute diastolic CHF (congestive heart failure) (HCC)    LOS: 3 days

## 2016-10-07 NOTE — Care Management Important Message (Signed)
Important Message  Patient Details  Name: [name] [name] MRN: [id number] Date of Birth: Sep 15, 1932   Medicare Important Message Given:  Yes    Malcolm Metro, RN 10/07/2016, 1:22 PM

## 2016-10-07 NOTE — Care Management Note (Addendum)
Case Management Note  Patient Details  Name: [name] [name] MRN: [id number] Date of Birth: 1932-07-15  Expected Discharge Date:    10/07/2016              Expected Discharge Plan:  Home w Home Health Services  In-House Referral:  NA  Discharge planning Services  CM Consult  Post Acute Care Choice:  Home Health Choice offered to:  Patient  HH Arranged:  RN, PT [facility] Agency:  [facility]  Status of Service:  Completed, signed off  Additional Comments: Anticipate DC home in next 24 hrs. Pt has remembered he has used [facility] int he past and would like them again. [facility] rep aware. Alroy Bailiff, of [facility], aware and will obtain pt info from chart. Pt/family aware HH has 48hrs to make first visit. Pt does not qualify for supplemental oxygen. Pt on ACO registry and referred for enrollment in emmi transition calls. Family at bedside for DC plan discussion.   Malcolm Metro, RN 10/07/2016, 1:16 PM

## 2016-10-08 LAB — BASIC METABOLIC PANEL
ANION GAP: 9 (ref 5–15)
BUN: 33 mg/dL — ABNORMAL HIGH (ref 6–20)
CALCIUM: 8.9 mg/dL (ref 8.9–10.3)
CO2: 31 mmol/L (ref 22–32)
Chloride: 95 mmol/L — ABNORMAL LOW (ref 101–111)
Creatinine, Ser: 0.78 mg/dL (ref 0.61–1.24)
Glucose, Bld: 92 mg/dL (ref 65–99)
POTASSIUM: 3.5 mmol/L (ref 3.5–5.1)
SODIUM: 135 mmol/L (ref 135–145)

## 2016-10-08 MED ORDER — SENNA 8.6 MG PO TABS: 1.0000 | ORAL_TABLET | Freq: Every day | ORAL | Status: DC

## 2016-10-08 MED ORDER — POLYETHYLENE GLYCOL 3350 17 G PO PACK: 17.0000 g | PACK | Freq: Two times a day (BID) | ORAL | Status: DC

## 2016-10-08 NOTE — Progress Notes (Signed)
Patient c/o right foot pain. Percocet given as ordered. Patient does not want right foot dressing changed until later. Will pass on to dayshift RN.

## 2016-10-08 NOTE — Discharge Summary (Signed)
Physician Discharge Summary  [name] [name] UJW:[id number] DOB: [birth year] DOA: 10/04/2016  PCP: Laural Golden (Inactive)  Admit date: 10/04/2016 Discharge date: 10/08/2016  Recommendations for Outpatient Follow-up:  1. Follow-up resolution of pneumonia 2. HH RN and PT, with attention to medication review and ability to self-administer long-term. 3. Long-term ability to take oral anticoagulation based on above.  4. Chronic opioid-induced constipation, started Miralax BID, senna at night. 5. CT suggested the possibility of chronic interstitial lung disease. Consider further evaluation as an outpatient if clinically indicated.   Follow-up Information    [facility] Follow up.   Contact information: [address] [place] [address] [phone]        Laural Golden. Schedule an appointment as soon as possible for a visit in 1 week(s).          Discharge Diagnoses:  1. Acute hypoxic respiratory failure 2. Acute diastolic CHF 3. Bilateral pneumonia 4. Chest pain 5. LLE DVT 6. Chronic right foot wound 7. Chronic constipation  Discharge Condition: improved Disposition: home with [facility] PT, RN  Diet recommendation: heart healthy  Filed Weights   10/06/16 0621 10/07/16 0657 10/08/16 0726  Weight: 88.3 kg (194 lb 10 oz) 85.5 kg (188 lb 6.4 oz) 85.3 kg (188 lb 2.5 oz)    History of present illness:  83 year old man PMH COPD, atrial fibrillation (previously on warfarin but stopped secondary to fall with subdural hematoma), alcohol abuse (quit 2010), presented with progressive shortness of breath, substernal chest pain with radiation to the left arm, recent diagnosis of DVT at [facility]. Admitted for acute hypoxic respiratory failure, acute CHF, chest pain, pneumonia, supratherapeutic INR.  Hospital Course:  Patient was treated with empiric antibiotics and IV diuresis with gradul clinical improvement and resolution of hypoxia. He was  assessed by wound care with recommendations to continue follow-up with [facility] wound care as an outpatient. Hospitalization was uncomplicated, individual issues as below:  1. Acute hypoxic respiratory failure secondary to diastolic heart failure, pneumonia.  Resolved.  2. Acute diastolic CHF. Normal LVEF, no regional wall motion abnormality by echocardiogram. Troponins flat, minimally elevated.  3. Chest pain, troponins without significant elevation, echocardiogram reassuring, ACS ruled out. Secondary to acute diastolic heart failure and pneumonia. CT chest negative for PE.    Pneumonia, bilateral. Resolved. Completed antibiotics as an inpatient.  5. High INR secondary to Xarelto. Not unexpected on this medication. No bleeding. No further evaluation suggested. Compliance questionalbe.  6. Left lower extremity DVT diagnosed prior to admission  Continue Xarelto. However, discussed with pharmacy, patient does have some confusion surrounding this medication. Discussed with daughter-in-law, no concerns about medication administration. I will d/w PCP 4/16.  7. Right foot wounds, followed by wound care center in [place]. Per Dr. 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 If he fails conservative management of this wound, he would need an amputation of his leg". (see abdominal aortogram 10/23/2015). HH RN. 8. Constipation. Continue bowel regimen  Procedures:  2-d echo Study Conclusions  - Left ventricle: The cavity size was normal. There was moderate concentric hypertrophy. Systolic function was normal. The estimated ejection fraction was in the range of 60% to 65%. Wall motion was normal; there were no regional wall motion abnormalities. The study was not technically sufficient to allow evaluation of LV diastolic  dysfunction due to atrial fibrillation. - Aortic valve: Moderately to severely calcified annulus. Trileaflet.  There was trivial regurgitation. - Aorta: Aortic root dimension: 41 mm (ED). - Aortic root: Mild aortic root dilatation. - Mitral valve: Mildly calcified annulus. - Left atrium: The atrium was severely dilated. - Right ventricle: The cavity size was mildly to moderately dilated. Systolic function was mildly reduced. - Right atrium: The atrium was moderately dilated. - Tricuspid valve: There was mild regurgitation. - Pulmonic valve: There was mild regurgitation. - Pulmonary arteries: PA peak pressure: 41 mm Hg (S). - Systemic veins: IVC was dilated with normal respiratory variation. Estimated CVP 8 mmHg.  Antimicrobials:  Ceftriaxone 4/9 >> 4/14  Azithromycin 4/10 >> 4/14   Today's assessment: S: breathing ok.  O: Vitals:   10/07/16 2138 10/08/16 0726  BP: (!) 110/45 (!) 107/43  Pulse: 63   Resp: 18 17  Temp: 97.5 F (36.4 C) 97.8 F (36.6 C)   Constitutional: appears calm, comfortable, ambulates to doorway and back without difficulty (using walker) CV: RRR no m/r/g. 1+ bilateral LE edema Respiratory: CTA bilaterally, no w/r/r. Normal respiratory effort.  Discharge Instructions   Allergies as of 10/08/2016      Reactions   Oxycodone-acetaminophen Itching   Sulfonamide Derivatives Hives   Tramadol Itching      Medication List    STOP taking these medications   ciprofloxacin 500 MG tablet Commonly known as:  CIPRO   docusate sodium 100 MG capsule Commonly known as:  COLACE   doxycycline 100 MG tablet Commonly known as:  ADOXA   lactulose 10 GM/15ML solution Commonly known as:  CHRONULAC   naproxen sodium 220 MG tablet Commonly known as:  ANAPROX     TAKE these medications   alendronate 70 MG tablet Commonly known as:  FOSAMAX Take 1 tablet (70 mg total) by mouth every 7 (seven) days. Take with a full glass of water on an empty  stomach.   aspirin 325 MG tablet Take 325 mg by mouth daily.   CALCIUM PO Take 1 tablet by mouth daily.   DOCOSAHEXAENOIC ACID PO Take 1 g by mouth.   escitalopram 10 MG tablet Commonly known as:  LEXAPRO Take 1 tablet (10 mg total) by mouth daily.   furosemide 40 MG tablet Commonly known as:  LASIX Take 2 tablets every morning What changed:  how much to take  how to take this  when to take this  reasons to take this  additional instructions   magnesium hydroxide 400 MG/5ML suspension Commonly known as:  MILK OF MAGNESIA Take 15 mLs by mouth daily as needed for mild constipation.   oxyCODONE-acetaminophen 7.5-325 MG tablet Commonly known as:  PERCOCET Take 1 tablet by mouth every 4 (four) hours as needed for severe pain.   pantoprazole 40 MG tablet Commonly known as:  PROTONIX Take 1 tablet (40 mg total) by mouth daily.   polyethylene glycol packet Commonly known as:  MIRALAX / GLYCOLAX Take 17 g by mouth 2 (two) times daily.   potassium chloride SA 20 MEQ tablet Commonly known as:  K-DUR,KLOR-CON Take 1 tablet by mouth 2 (two) times daily.   rivaroxaban 20 MG Tabs tablet Commonly known as:  XARELTO Take 1 tablet (20 mg total) by mouth daily with supper. What changed:  Another medication with the same name was removed. Continue taking this medication, and follow the directions you see here.   senna 8.6 MG Tabs tablet Commonly known as:  SENOKOT Take 1 tablet (8.6 mg total) by mouth at bedtime.   sucralfate 1 g tablet Commonly known  as:  CARAFATE DISSOLVE 1 TABLET IN 2 OZ WATER AND DRINK BEFORE MEALS AND AT BEDTIMEFOR 2 WEEKS.   verapamil 180 MG CR tablet Commonly known as:  CALAN-SR Take 1 tablet (180 mg total) by mouth daily.   VITAMIN D PO Take 1 capsule by mouth daily.      Allergies  Allergen Reactions  . Oxycodone-Acetaminophen Itching  . Sulfonamide Derivatives Hives  . Tramadol Itching    The results of significant diagnostics from  this hospitalization (including imaging, microbiology, ancillary and laboratory) are listed below for reference.    Significant Diagnostic Studies: Dg Chest 2 View  Result Date: 10/04/2016 CLINICAL DATA:  Acute onset of generalized chest pain, radiating to the left arm. Initial encounter. EXAM: CHEST  2 VIEW COMPARISON:  Chest radiograph performed 02/29/2016 FINDINGS: The lungs are well-aerated. Patchy bilateral airspace opacities raise concern for multifocal pneumonia, superimposed on chronic interstitial changes. There is no evidence of pleural effusion or pneumothorax. The heart is enlarged.  No acute osseous abnormalities are seen. IMPRESSION: 1. Patchy bilateral airspace opacities raise concern for multifocal pneumonia, superimposed on chronic interstitial changes. Underlying worsening chronic interstitial lung disease is a concern. 2. Cardiomegaly. Electronically Signed   By: Roanna Raider M.D.   On: 10/04/2016 05:35   Ct Angio Chest Pe W/cm &/or Wo Cm  Result Date: 10/04/2016 CLINICAL DATA:  Acute onset of shortness of breath and generalized chest pain. Recently diagnosed with deep venous thrombosis. EXAM: CT ANGIOGRAPHY CHEST WITH CONTRAST TECHNIQUE: Multidetector CT imaging of the chest was performed using the standard protocol during bolus administration of intravenous contrast. Multiplanar CT image reconstructions and MIPs were obtained to evaluate the vascular anatomy. CONTRAST:  100 mL of Isovue 370 IV contrast COMPARISON:  Chest radiograph performed earlier today at 4:58 a.m., and CT of the chest performed 12/13/2002 FINDINGS: Cardiovascular:  There is no evidence of pulmonary embolus. The heart is mildly enlarged, with biatrial enlargement. Diffuse coronary artery calcifications are seen. Scattered calcification is seen about the thoracic aorta and proximal great vessels. Mediastinum/Nodes: A 1.7 cm azygoesophageal recess node is noted. A 2.1 cm right paratracheal node is noted. A 1.3 cm  periaortic node is seen. No pericardial effusion is identified. The thyroid gland is unremarkable. No axillary lymphadenopathy is appreciated. Lungs/Pleura: Small bilateral pleural effusions are noted. Diffuse ground-glass airspace opacification is seen bilaterally. This may reflect pulmonary edema or pneumonia, though pulmonary alveolar proteinosis could have a similar appearance. No pneumothorax is seen. Upper Abdomen: The visualized portions of the liver and spleen are grossly unremarkable. Musculoskeletal: No acute osseous abnormalities are identified. There is chronic loss of height at vertebral bodies T5, T7 and T11. The visualized musculature is unremarkable in appearance. Review of the MIP images confirms the above findings. IMPRESSION: 1. No evidence of pulmonary embolus. 2. Small bilateral pleural effusions. Diffuse ground-glass airspace opacification noted bilaterally. This may reflect pulmonary edema or pneumonia, though pulmonary alveolar proteinosis could have a similar appearance. 3. Enlarged mediastinal nodes may reflect underlying infection. 4. Mild cardiomegaly, with biatrial enlargement. Diffuse coronary artery calcifications seen. 5. Chronic loss of height at vertebral bodies T5, T7 and T11. Electronically Signed   By: Roanna Raider M.D.   On: 10/04/2016 06:15    Microbiology: Recent Results (from the past 240 hour(s))  Culture, blood (routine x 2) Call MD if unable to obtain prior to antibiotics being given     Status: None (Preliminary result)   Collection Time: 10/04/16  6:45 AM  Result Value Ref  Range Status   Specimen Description BLOOD LEFT ARM  Final   Special Requests   Final    BOTTLES DRAWN AEROBIC AND ANAEROBIC Blood Culture adequate volume   Culture NO GROWTH 3 DAYS  Final   Report Status PENDING  Incomplete  Culture, blood (routine x 2) Call MD if unable to obtain prior to antibiotics being given     Status: None (Preliminary result)   Collection Time: 10/04/16 10:53 AM   Result Value Ref Range Status   Specimen Description LEFT ANTECUBITAL  Final   Special Requests   Final    BOTTLES DRAWN AEROBIC AND ANAEROBIC Blood Culture adequate volume   Culture NO GROWTH 3 DAYS  Final   Report Status PENDING  Incomplete     Labs: Basic Metabolic Panel:  Recent Labs Lab 10/04/16 0450 10/05/16 0541 10/06/16 0538 10/07/16 0611 10/08/16 0546  NA 136 137 136 134* 135  K 3.9 4.0 4.3 3.5 3.5  CL 101 100* 98* 96* 95*  CO2 GLUCOSE 125* 106* 103* 99 92  BUN 45* 33* 38* 40* 33*  CREATININE 1.03 0.94 0.95 0.88 0.78  CALCIUM 8.7* 8.6* 8.6* 8.5* 8.9    CBC:  Recent Labs Lab 10/04/16 0450 10/05/16 0541  WBC 11.4* 8.3  HGB 10.1* 9.6*  HCT 30.9* 29.1*  MCV 90.9 90.9  PLT 223 225   Cardiac Enzymes:  Recent Labs Lab 10/04/16 0645 10/04/16 1044 10/04/16 1609 10/04/16 2226  TROPONINI 0.03* 0.03* 0.03* 0.03*      Recent Labs  01/06/16 1529 02/09/16 1822 10/04/16 0450  BNP 399.1* 344.0* 666.0*      Active Problems:   COPD (chronic obstructive pulmonary disease) (HCC)   Left leg DVT (HCC)   CAP (community acquired pneumonia)   AF (paroxysmal atrial fibrillation) (HCC)   Acute respiratory failure with hypoxia (HCC)   Pressure injury of skin   Acute diastolic CHF (congestive heart failure) (HCC)   Time coordinating discharge: 35 minutes  Signed:  Brendia Sacks, MD [facility] 10/08/2016, 1:34 PM

## 2016-10-09 LAB — CULTURE, BLOOD (ROUTINE X 2)
CULTURE: NO GROWTH
Culture: NO GROWTH
SPECIAL REQUESTS: ADEQUATE
Special Requests: ADEQUATE

## 2016-10-10 LAB — PREPARE FRESH FROZEN PLASMA: UNIT DIVISION: 0

## 2016-10-10 LAB — BPAM FFP
Blood Product Expiration Date: 201804152359
Unit Type and Rh: 6200

## 2016-10-12 ENCOUNTER — Encounter: Payer: Self-pay | Admitting: Family Medicine

## 2016-10-12 ENCOUNTER — Ambulatory Visit (INDEPENDENT_AMBULATORY_CARE_PROVIDER_SITE_OTHER): Payer: Medicare Other | Admitting: Family Medicine

## 2016-10-12 VITALS — BP 112/74 | HR 49 | Temp 97.7°F | Ht 71.0 in | Wt 191.0 lb

## 2016-10-12 DIAGNOSIS — Z5189 Encounter for other specified aftercare: Secondary | ICD-10-CM

## 2016-10-12 DIAGNOSIS — Z7901 Long term (current) use of anticoagulants: Secondary | ICD-10-CM | POA: Diagnosis not present

## 2016-10-12 DIAGNOSIS — F0390 Unspecified dementia without behavioral disturbance: Secondary | ICD-10-CM

## 2016-10-12 DIAGNOSIS — I509 Heart failure, unspecified: Secondary | ICD-10-CM

## 2016-10-12 MED ORDER — WARFARIN SODIUM 5 MG PO TABS
5.0000 mg | ORAL_TABLET | Freq: Every day | ORAL | 2 refills | Status: DC
Start: 2016-10-12 — End: 2016-11-17

## 2016-10-12 NOTE — Progress Notes (Signed)
   Subjective:    Patient ID: [name] [name], male    DOB: [birth year], 83 y.o.   MRN: [id number] Patient seen within 48 hours of discharge from the hospital. Patient comes in with a very complex set of concerns.   HPIpt arrives with son Julian Reil.  Follow up hospitalization. Shortness of breath. Patient was seen for shortness of breath. Review records today reveals scan showed pneumonia versus excess fluid. Next  Echocardiogram revealed diminished ejection fraction with atrial fibrillation as underlying rhythm.  Patient continues to struggle with right foot ulceration. Sees the wound care center off-and-on.  Was benefiting in the past from dressing changes at home.  Family is hopeful with recent hospitalization he will qualify once again for home health.  Patient also seen several weeks ago with a DVT. At that time could not afford Lovenox. Was given Xarelto samples. Claims he has been taking these. Presented to the ER with an elevated INR. Now states he is out of Xarelto.  Of note Coumadin was stopped in the past 2 to frequent falls from alcohol abuse. Patient states now not drinking. He had experienced remotely subdural hematomas  Pt does not think he is taking xarelto that was started in the hospital. O2 today 92.   Son requesting a home health nurse.   Review of Systems No headache, no major weight loss or weight gain, no chest pain no back pain abdominal pain no change in bowel habits complete ROS otherwise negative     Objective:   Physical Exam Alert active mentation slightly slowed. Her pressure good HEENT normal neck supple lungs clear heart irregular rhythm controlled rate positive low back pain. Abdomen benign. Ankles trace edema bilateral negative Homans sign. Right foot open ulceration dorsal foot and plantar surface. And distal toe.       Assessment & Plan:  Impression #1 status post bilateral pneumonia/excess fluid. #2 progressive foot ulceration with both vascular surgeon  and wound care center specialist recommending amputation. Patient has declined this. Therefore and I am just warning this today the folks at the wound care center said did not return in less he wants to press on with this. Patient continued continues to self dress #3 anticoagulation. Challenging with patient's history of noncompliance and prior injuries from alcohol abuse discussed frankly with family. Will maintain Coumadin for now with just recent diagnosis of DVT. Plan INR in 1 week. Attempt towards home health referral. Coumadin 5 mg daily. Most recent INR subtherapeutic.

## 2016-10-13 DIAGNOSIS — N189 Chronic kidney disease, unspecified: Secondary | ICD-10-CM | POA: Diagnosis not present

## 2016-10-13 DIAGNOSIS — M1990 Unspecified osteoarthritis, unspecified site: Secondary | ICD-10-CM | POA: Diagnosis not present

## 2016-10-13 DIAGNOSIS — J189 Pneumonia, unspecified organism: Secondary | ICD-10-CM | POA: Diagnosis not present

## 2016-10-13 DIAGNOSIS — I130 Hypertensive heart and chronic kidney disease with heart failure and stage 1 through stage 4 chronic kidney disease, or unspecified chronic kidney disease: Secondary | ICD-10-CM | POA: Diagnosis not present

## 2016-10-13 DIAGNOSIS — I872 Venous insufficiency (chronic) (peripheral): Secondary | ICD-10-CM | POA: Diagnosis not present

## 2016-10-13 DIAGNOSIS — M810 Age-related osteoporosis without current pathological fracture: Secondary | ICD-10-CM | POA: Diagnosis not present

## 2016-10-13 DIAGNOSIS — Z7901 Long term (current) use of anticoagulants: Secondary | ICD-10-CM | POA: Diagnosis not present

## 2016-10-13 DIAGNOSIS — J440 Chronic obstructive pulmonary disease with acute lower respiratory infection: Secondary | ICD-10-CM | POA: Diagnosis not present

## 2016-10-13 DIAGNOSIS — I82402 Acute embolism and thrombosis of unspecified deep veins of left lower extremity: Secondary | ICD-10-CM | POA: Diagnosis not present

## 2016-10-13 DIAGNOSIS — I4891 Unspecified atrial fibrillation: Secondary | ICD-10-CM | POA: Diagnosis not present

## 2016-10-13 DIAGNOSIS — I5031 Acute diastolic (congestive) heart failure: Secondary | ICD-10-CM | POA: Diagnosis not present

## 2016-10-13 DIAGNOSIS — L97512 Non-pressure chronic ulcer of other part of right foot with fat layer exposed: Secondary | ICD-10-CM | POA: Diagnosis not present

## 2016-10-17 ENCOUNTER — Other Ambulatory Visit: Payer: Self-pay

## 2016-10-17 ENCOUNTER — Telehealth: Payer: Self-pay | Admitting: Family Medicine

## 2016-10-17 DIAGNOSIS — I130 Hypertensive heart and chronic kidney disease with heart failure and stage 1 through stage 4 chronic kidney disease, or unspecified chronic kidney disease: Secondary | ICD-10-CM | POA: Diagnosis not present

## 2016-10-17 DIAGNOSIS — I872 Venous insufficiency (chronic) (peripheral): Secondary | ICD-10-CM | POA: Diagnosis not present

## 2016-10-17 DIAGNOSIS — L97512 Non-pressure chronic ulcer of other part of right foot with fat layer exposed: Secondary | ICD-10-CM | POA: Diagnosis not present

## 2016-10-17 DIAGNOSIS — I5031 Acute diastolic (congestive) heart failure: Secondary | ICD-10-CM | POA: Diagnosis not present

## 2016-10-17 DIAGNOSIS — N189 Chronic kidney disease, unspecified: Secondary | ICD-10-CM | POA: Diagnosis not present

## 2016-10-17 DIAGNOSIS — I82402 Acute embolism and thrombosis of unspecified deep veins of left lower extremity: Secondary | ICD-10-CM | POA: Diagnosis not present

## 2016-10-17 NOTE — Telephone Encounter (Signed)
Patient has listed that he is supposed to be on potassium 10 MEQ by prescription, but he is actually taking potassium OTC 99 mg.  [name] [name] would like clarification on what is he supposed to be taking.

## 2016-10-17 NOTE — Patient Outreach (Signed)
[facility]) Care Management  10/17/2016  [name] [name] 02-22-1933 [id number]  EMMI: COPD  Referral date: 10/17/16 Referral source: EMMI COPD red alert Referral reason: Rescue inhaler used 7 times: YES Day # 3  Telephone call to patient regarding EMMI copd RED alert. HIPAA verified with patient. Discussed EMMI copd program with patient. Patient reports he does not have and is not using a rescue inhaler. Patient states he was in the hospital for pneumonia and does have COPD. Patient in agreement to receiving additional education regarding COPD.  Patient states he has a wound to his right foot. States the wound is located under foot next to the little and middle toe. Patient states [facility] has [facility] providing nursing and physical therapy services. Patient states the nurse puts medication on the foot and wraps.  Patient reports he was being seen by the wound center but was discharged approximately 4 weeks ago. Patient states he primary MD is following him regarding his foot.  Denies any recommendation being made for specialist follow up. Patient reports his pain level to be between and 4-6. Patient states he takes oxycodone 2-3 times a day plus 2 aleve as recommended by his doctor.  Patient states he is taking his medications as prescribed and can afford. Patient reports he provides his own transportation to his appointments. Patient states he lives with wife and has supportive childre.  Patient reports he is aware of how to contact his doctor after hours. Advised by [facility] to call 911 for severe breathing or severe symptoms overall. Patient verbalized understanding.  Patient verbally agreed to follow up by health coach for ongoing COPD education.   ASSESSMENT;  Patient referred by [facility] COPD. Admission 10/04/16 to 10/08/16 Per patients electronic medical record patient needs ongoing medication review for ability to self administer long term anticoagulants. Chronic opoid induced  constipation.  Would benefit from additional education regarding constipation.   PLAN;  RNCM will refer patient to health coach for ongoing education regarding COPD  George Ina RN,BSN,CCM [facility] Telephonic  [phone]

## 2016-10-19 ENCOUNTER — Other Ambulatory Visit: Payer: Self-pay | Admitting: *Deleted

## 2016-10-19 ENCOUNTER — Telehealth: Payer: Self-pay | Admitting: Family Medicine

## 2016-10-19 DIAGNOSIS — I130 Hypertensive heart and chronic kidney disease with heart failure and stage 1 through stage 4 chronic kidney disease, or unspecified chronic kidney disease: Secondary | ICD-10-CM | POA: Diagnosis not present

## 2016-10-19 DIAGNOSIS — I872 Venous insufficiency (chronic) (peripheral): Secondary | ICD-10-CM | POA: Diagnosis not present

## 2016-10-19 DIAGNOSIS — Z13220 Encounter for screening for lipoid disorders: Secondary | ICD-10-CM

## 2016-10-19 DIAGNOSIS — N189 Chronic kidney disease, unspecified: Secondary | ICD-10-CM | POA: Diagnosis not present

## 2016-10-19 DIAGNOSIS — I82402 Acute embolism and thrombosis of unspecified deep veins of left lower extremity: Secondary | ICD-10-CM | POA: Diagnosis not present

## 2016-10-19 DIAGNOSIS — Z79899 Other long term (current) drug therapy: Secondary | ICD-10-CM

## 2016-10-19 DIAGNOSIS — L97512 Non-pressure chronic ulcer of other part of right foot with fat layer exposed: Secondary | ICD-10-CM | POA: Diagnosis not present

## 2016-10-19 DIAGNOSIS — I10 Essential (primary) hypertension: Secondary | ICD-10-CM

## 2016-10-19 DIAGNOSIS — I5031 Acute diastolic (congestive) heart failure: Secondary | ICD-10-CM | POA: Diagnosis not present

## 2016-10-19 MED ORDER — POTASSIUM CHLORIDE CRYS ER 10 MEQ PO TBCR: 10.0000 meq | EXTENDED_RELEASE_TABLET | Freq: Every day | ORAL | 5 refills | Status: DC

## 2016-10-19 NOTE — Telephone Encounter (Signed)
Orders ready. Pt notified to do bw the week before his appt. Pt verbalized understanding.

## 2016-10-19 NOTE — Telephone Encounter (Signed)
Call fm rec prescription pot 10 meq actually stronger, six mo worth

## 2016-10-19 NOTE — Telephone Encounter (Signed)
Patient is scheduled for 11/10/16 to see Dr. Brett Canales.  He said he has blood work he has to do, but would like to know if this should be done the day of the appointment or before the appointment?

## 2016-10-19 NOTE — Telephone Encounter (Signed)
Discussed with pt and with Byrd Hesselbach from [facility]. Med sent to pharm.

## 2016-10-19 NOTE — Telephone Encounter (Signed)
Met 7 cbc liv liip plz wait til wk before visit

## 2016-10-19 NOTE — Telephone Encounter (Signed)
Does he need bw before appt.

## 2016-10-20 ENCOUNTER — Telehealth: Payer: Self-pay | Admitting: *Deleted

## 2016-10-20 DIAGNOSIS — I5031 Acute diastolic (congestive) heart failure: Secondary | ICD-10-CM | POA: Diagnosis not present

## 2016-10-20 DIAGNOSIS — N189 Chronic kidney disease, unspecified: Secondary | ICD-10-CM | POA: Diagnosis not present

## 2016-10-20 DIAGNOSIS — I872 Venous insufficiency (chronic) (peripheral): Secondary | ICD-10-CM | POA: Diagnosis not present

## 2016-10-20 DIAGNOSIS — L97512 Non-pressure chronic ulcer of other part of right foot with fat layer exposed: Secondary | ICD-10-CM | POA: Diagnosis not present

## 2016-10-20 DIAGNOSIS — I82402 Acute embolism and thrombosis of unspecified deep veins of left lower extremity: Secondary | ICD-10-CM | POA: Diagnosis not present

## 2016-10-20 DIAGNOSIS — I130 Hypertensive heart and chronic kidney disease with heart failure and stage 1 through stage 4 chronic kidney disease, or unspecified chronic kidney disease: Secondary | ICD-10-CM | POA: Diagnosis not present

## 2016-10-20 NOTE — Telephone Encounter (Signed)
Home health nurse [name] [name] calling to report INR. 1.8. Takes coumadin  one daily. HR 48. Pt feels ok. Consult with dr Lorin Picket. Change coumadin to one and a half on thursdays and one all other days. Recheck in one week. Ov with dr Brett Canales in one week. [name] [name] notified. She schedule ov for pt with dr Brett Canales in one week. [name] [name] would like to do INR herself. She states it is easier for her to track it and she will call will result in one week.

## 2016-10-24 DIAGNOSIS — L97512 Non-pressure chronic ulcer of other part of right foot with fat layer exposed: Secondary | ICD-10-CM | POA: Diagnosis not present

## 2016-10-24 DIAGNOSIS — I130 Hypertensive heart and chronic kidney disease with heart failure and stage 1 through stage 4 chronic kidney disease, or unspecified chronic kidney disease: Secondary | ICD-10-CM | POA: Diagnosis not present

## 2016-10-24 DIAGNOSIS — I872 Venous insufficiency (chronic) (peripheral): Secondary | ICD-10-CM | POA: Diagnosis not present

## 2016-10-24 DIAGNOSIS — I5031 Acute diastolic (congestive) heart failure: Secondary | ICD-10-CM | POA: Diagnosis not present

## 2016-10-24 DIAGNOSIS — I10 Essential (primary) hypertension: Secondary | ICD-10-CM | POA: Diagnosis not present

## 2016-10-24 DIAGNOSIS — N189 Chronic kidney disease, unspecified: Secondary | ICD-10-CM | POA: Diagnosis not present

## 2016-10-24 DIAGNOSIS — Z79899 Other long term (current) drug therapy: Secondary | ICD-10-CM | POA: Diagnosis not present

## 2016-10-24 DIAGNOSIS — Z13220 Encounter for screening for lipoid disorders: Secondary | ICD-10-CM | POA: Diagnosis not present

## 2016-10-24 DIAGNOSIS — I82402 Acute embolism and thrombosis of unspecified deep veins of left lower extremity: Secondary | ICD-10-CM | POA: Diagnosis not present

## 2016-10-25 ENCOUNTER — Telehealth: Payer: Self-pay | Admitting: *Deleted

## 2016-10-25 DIAGNOSIS — I5031 Acute diastolic (congestive) heart failure: Secondary | ICD-10-CM | POA: Diagnosis not present

## 2016-10-25 DIAGNOSIS — I130 Hypertensive heart and chronic kidney disease with heart failure and stage 1 through stage 4 chronic kidney disease, or unspecified chronic kidney disease: Secondary | ICD-10-CM | POA: Diagnosis not present

## 2016-10-25 DIAGNOSIS — I82402 Acute embolism and thrombosis of unspecified deep veins of left lower extremity: Secondary | ICD-10-CM | POA: Diagnosis not present

## 2016-10-25 DIAGNOSIS — N189 Chronic kidney disease, unspecified: Secondary | ICD-10-CM | POA: Diagnosis not present

## 2016-10-25 DIAGNOSIS — I872 Venous insufficiency (chronic) (peripheral): Secondary | ICD-10-CM | POA: Diagnosis not present

## 2016-10-25 DIAGNOSIS — L97512 Non-pressure chronic ulcer of other part of right foot with fat layer exposed: Secondary | ICD-10-CM | POA: Diagnosis not present

## 2016-10-25 LAB — CBC WITH DIFFERENTIAL/PLATELET
BASOS ABS: 0.1 10*3/uL (ref 0.0–0.2)
Basos: 2 %
EOS (ABSOLUTE): 0.7 10*3/uL — AB (ref 0.0–0.4)
Eos: 11 %
Hematocrit: 32.2 % — ABNORMAL LOW (ref 37.5–51.0)
Hemoglobin: 10.3 g/dL — ABNORMAL LOW (ref 13.0–17.7)
IMMATURE GRANULOCYTES: 0 %
Immature Grans (Abs): 0 10*3/uL (ref 0.0–0.1)
Lymphocytes Absolute: 0.8 10*3/uL (ref 0.7–3.1)
Lymphs: 13 %
MCH: 28.3 pg (ref 26.6–33.0)
MCHC: 32 g/dL (ref 31.5–35.7)
MCV: 89 fL (ref 79–97)
MONOS ABS: 0.3 10*3/uL (ref 0.1–0.9)
Monocytes: 4 %
NEUTROS PCT: 70 %
Neutrophils Absolute: 4.3 10*3/uL (ref 1.4–7.0)
PLATELETS: 246 10*3/uL (ref 150–379)
RBC: 3.64 x10E6/uL — ABNORMAL LOW (ref 4.14–5.80)
RDW: 15.2 % (ref 12.3–15.4)
WBC: 6.1 10*3/uL (ref 3.4–10.8)

## 2016-10-25 LAB — HEPATIC FUNCTION PANEL
ALK PHOS: 95 IU/L (ref 39–117)
ALT: 7 IU/L (ref 0–44)
AST: 14 IU/L (ref 0–40)
Albumin: 3.7 g/dL (ref 3.5–4.7)
Bilirubin Total: 0.3 mg/dL (ref 0.0–1.2)
Bilirubin, Direct: 0.08 mg/dL (ref 0.00–0.40)
TOTAL PROTEIN: 6.1 g/dL (ref 6.0–8.5)

## 2016-10-25 LAB — LIPID PANEL
CHOLESTEROL TOTAL: 193 mg/dL (ref 100–199)
Chol/HDL Ratio: 5.2 ratio — ABNORMAL HIGH (ref 0.0–5.0)
HDL: 37 mg/dL — AB (ref >39)
LDL Calculated: 103 mg/dL — ABNORMAL HIGH (ref 0–99)
Triglycerides: 265 mg/dL — ABNORMAL HIGH (ref 0–149)
VLDL CHOLESTEROL CAL: 53 mg/dL — AB (ref 5–40)

## 2016-10-25 LAB — BASIC METABOLIC PANEL
BUN/Creatinine Ratio: 39 — ABNORMAL HIGH (ref 10–24)
BUN: 28 mg/dL — ABNORMAL HIGH (ref 8–27)
CALCIUM: 9 mg/dL (ref 8.6–10.2)
CHLORIDE: 102 mmol/L (ref 96–106)
CO2: 25 mmol/L (ref 18–29)
Creatinine, Ser: 0.71 mg/dL — ABNORMAL LOW (ref 0.76–1.27)
GFR calc Af Amer: 102 mL/min/{1.73_m2} (ref >59)
GFR calc non Af Amer: 88 mL/min/{1.73_m2} (ref >59)
Glucose: 91 mg/dL (ref 65–99)
POTASSIUM: 5.5 mmol/L — AB (ref 3.5–5.2)
SODIUM: 141 mmol/L (ref 134–144)

## 2016-10-25 NOTE — Telephone Encounter (Signed)
I spoke with the home health nurse and the patient is following up tomorrow

## 2016-10-25 NOTE — Telephone Encounter (Signed)
[name] [name] at [facility] called and spoke to Dr Lorin Picket concerning ulcer with exposed bone on the foot as well as INR of 2.7. Dr Lorin Picket advised to continue same treatment and recheck INR via home health in one week. Patient with office visit with Dr Brett Canales tomorrow.

## 2016-10-26 ENCOUNTER — Ambulatory Visit (INDEPENDENT_AMBULATORY_CARE_PROVIDER_SITE_OTHER): Payer: Medicare Other | Admitting: Family Medicine

## 2016-10-26 ENCOUNTER — Encounter: Payer: Self-pay | Admitting: Family Medicine

## 2016-10-26 VITALS — BP 124/74 | HR 52 | Ht 71.0 in | Wt 191.6 lb

## 2016-10-26 DIAGNOSIS — I130 Hypertensive heart and chronic kidney disease with heart failure and stage 1 through stage 4 chronic kidney disease, or unspecified chronic kidney disease: Secondary | ICD-10-CM | POA: Diagnosis not present

## 2016-10-26 DIAGNOSIS — I5031 Acute diastolic (congestive) heart failure: Secondary | ICD-10-CM | POA: Diagnosis not present

## 2016-10-26 DIAGNOSIS — L97504 Non-pressure chronic ulcer of other part of unspecified foot with necrosis of bone: Secondary | ICD-10-CM

## 2016-10-26 DIAGNOSIS — N189 Chronic kidney disease, unspecified: Secondary | ICD-10-CM | POA: Diagnosis not present

## 2016-10-26 DIAGNOSIS — R001 Bradycardia, unspecified: Secondary | ICD-10-CM

## 2016-10-26 DIAGNOSIS — I872 Venous insufficiency (chronic) (peripheral): Secondary | ICD-10-CM | POA: Diagnosis not present

## 2016-10-26 DIAGNOSIS — I82402 Acute embolism and thrombosis of unspecified deep veins of left lower extremity: Secondary | ICD-10-CM | POA: Diagnosis not present

## 2016-10-26 DIAGNOSIS — L97512 Non-pressure chronic ulcer of other part of right foot with fat layer exposed: Secondary | ICD-10-CM | POA: Diagnosis not present

## 2016-10-26 MED ORDER — VERAPAMIL HCL ER 120 MG PO TBCR: 120.0000 mg | EXTENDED_RELEASE_TABLET | Freq: Every day | ORAL | 5 refills | Status: DC

## 2016-10-26 MED ORDER — OXYCODONE-ACETAMINOPHEN 7.5-325 MG PO TABS
1.0000 | ORAL_TABLET | Freq: Three times a day (TID) | ORAL | 0 refills | Status: DC | PRN
Start: 2016-10-26 — End: 2016-10-26

## 2016-10-26 MED ORDER — OXYCODONE-ACETAMINOPHEN 7.5-325 MG PO TABS
1.0000 | ORAL_TABLET | Freq: Three times a day (TID) | ORAL | 0 refills | Status: DC | PRN
Start: 2016-10-26 — End: 2017-01-06

## 2016-10-26 NOTE — Progress Notes (Signed)
   Subjective:    Patient ID: [name] [name], male    DOB: January 06, 1933, 83 y.o.   MRN: [id number]  HPI Patient arrives to discuss low heart rate . The home heath nurse was wondering if any of his medication needed to be adjusted.Patient has chronic atrial fibrillation. Uses verapamil 18 ESR. Nurse concerned about low heart rate. Heart rates often in the low 50s. No dizziness no chest pain no shortness of breath out of the ordinary for the patient. Next  Family presents 1 skin for a very long discussion regarding patient's right foot. He has been followed by the wound center. They sent him on for a vascular surgeon referral in [place]. Unfortunately they felt that the patient was facing probable amputation of the foot. There was no Avenue towards vascular revascularization.  Patient somewhat frustrated by all this. Has had open sore on the dorsum of the foot with tendon exposure. An open sore on distal toes with bone exposure now for numerous months.   Review of Systems No headache, no major weight loss or weight gain, no chest pain no back pain abdominal pain no change in bowel habits complete ROS otherwise negative         Physical Exam  Alert and oriented, vitals reviewed and stable, NAD ENT-TM's and ext canals WNL bilat via otoscopic exam Soft palate, tonsils and post pharynx WNL via oropharyngeal exam Neck-symmetric, no masses; thyroid nonpalpable and nontender Pulmonary-no tachypnea or accessory muscle use; Clear without wheezes via auscultation Card--no abnrml murmurs,  Carotid pulses symmetric, without bruits Heart rate 54 on repeat. Rhythm irregular but in control  An impressive dorsal ulceration right foot. Pulses absent. Capillary refill present but slow. Significant ulceration toes and plantar surface      Assessment & Plan:  Impression 1 lower heart rate with atrial fibrillation will adjust verapamil SR #2 ongoing foot challenges. Really the major part of the discussion with  patient and family today. Patient is facing likely amputation. On one level he understands this. On another level he does not seem to. I have basically strongly encouraged him to get back with the wound center. He agrees to do this will proceed.  Greater than 50% of this 25 minute face to face visit was spent in counseling and discussion and coordination of care regarding the above diagnosis/diagnosies

## 2016-10-27 ENCOUNTER — Encounter: Payer: Self-pay | Admitting: Family Medicine

## 2016-10-28 ENCOUNTER — Telehealth: Payer: Self-pay | Admitting: Family Medicine

## 2016-10-28 DIAGNOSIS — N189 Chronic kidney disease, unspecified: Secondary | ICD-10-CM | POA: Diagnosis not present

## 2016-10-28 DIAGNOSIS — L97512 Non-pressure chronic ulcer of other part of right foot with fat layer exposed: Secondary | ICD-10-CM | POA: Diagnosis not present

## 2016-10-28 DIAGNOSIS — I82402 Acute embolism and thrombosis of unspecified deep veins of left lower extremity: Secondary | ICD-10-CM | POA: Diagnosis not present

## 2016-10-28 DIAGNOSIS — I130 Hypertensive heart and chronic kidney disease with heart failure and stage 1 through stage 4 chronic kidney disease, or unspecified chronic kidney disease: Secondary | ICD-10-CM | POA: Diagnosis not present

## 2016-10-28 DIAGNOSIS — I5031 Acute diastolic (congestive) heart failure: Secondary | ICD-10-CM | POA: Diagnosis not present

## 2016-10-28 DIAGNOSIS — I872 Venous insufficiency (chronic) (peripheral): Secondary | ICD-10-CM | POA: Diagnosis not present

## 2016-10-28 NOTE — Telephone Encounter (Signed)
[facility] she may do neosporin and gauze bandages on Mr.Mathew's toe. Also referral in process. Patient verbalized understanding.

## 2016-10-28 NOTE — Telephone Encounter (Signed)
Sure, wound care referral in process

## 2016-10-28 NOTE — Telephone Encounter (Signed)
[name] at [facility] called to say [name] [name]'s right toenail has come off and she wants and order for neosporin and gauze bandage.  She can be reached at [phone]

## 2016-10-31 ENCOUNTER — Telehealth: Payer: Self-pay | Admitting: Family Medicine

## 2016-10-31 DIAGNOSIS — N189 Chronic kidney disease, unspecified: Secondary | ICD-10-CM | POA: Diagnosis not present

## 2016-10-31 DIAGNOSIS — I82402 Acute embolism and thrombosis of unspecified deep veins of left lower extremity: Secondary | ICD-10-CM | POA: Diagnosis not present

## 2016-10-31 DIAGNOSIS — I872 Venous insufficiency (chronic) (peripheral): Secondary | ICD-10-CM | POA: Diagnosis not present

## 2016-10-31 DIAGNOSIS — I5031 Acute diastolic (congestive) heart failure: Secondary | ICD-10-CM | POA: Diagnosis not present

## 2016-10-31 DIAGNOSIS — I130 Hypertensive heart and chronic kidney disease with heart failure and stage 1 through stage 4 chronic kidney disease, or unspecified chronic kidney disease: Secondary | ICD-10-CM | POA: Diagnosis not present

## 2016-10-31 DIAGNOSIS — L97512 Non-pressure chronic ulcer of other part of right foot with fat layer exposed: Secondary | ICD-10-CM | POA: Diagnosis not present

## 2016-10-31 NOTE — Telephone Encounter (Signed)
nursees : current dose? How long since last inr? Value then?

## 2016-10-31 NOTE — Telephone Encounter (Signed)
10/25/16 INR 2.7   Continue same treatment recheck in one week   Coumadin 5 mg 1.5 on Thursday and one all other days

## 2016-10-31 NOTE — Telephone Encounter (Signed)
Do 1.5 thur and tu one all other days, and repeat in two weeks

## 2016-10-31 NOTE — Telephone Encounter (Signed)
[facility] to report pt's PT/INR  2.1 today   Please advise

## 2016-10-31 NOTE — Telephone Encounter (Signed)
Spoke with [name] [name] at home health and advised her Dr Brett CanalesSteve recommends to increase to Coumadin 5 mg 1.5 tablets on Tuesdays and Thursdays and one tablet all other days- recheck in 2 weeks. [name] [name] verbalized understanding.

## 2016-11-01 ENCOUNTER — Encounter: Payer: Self-pay | Admitting: *Deleted

## 2016-11-01 ENCOUNTER — Other Ambulatory Visit: Payer: Self-pay | Admitting: *Deleted

## 2016-11-01 DIAGNOSIS — I130 Hypertensive heart and chronic kidney disease with heart failure and stage 1 through stage 4 chronic kidney disease, or unspecified chronic kidney disease: Secondary | ICD-10-CM | POA: Diagnosis not present

## 2016-11-01 DIAGNOSIS — N189 Chronic kidney disease, unspecified: Secondary | ICD-10-CM | POA: Diagnosis not present

## 2016-11-01 DIAGNOSIS — I5031 Acute diastolic (congestive) heart failure: Secondary | ICD-10-CM | POA: Diagnosis not present

## 2016-11-01 DIAGNOSIS — I82402 Acute embolism and thrombosis of unspecified deep veins of left lower extremity: Secondary | ICD-10-CM | POA: Diagnosis not present

## 2016-11-01 DIAGNOSIS — I872 Venous insufficiency (chronic) (peripheral): Secondary | ICD-10-CM | POA: Diagnosis not present

## 2016-11-01 DIAGNOSIS — L97512 Non-pressure chronic ulcer of other part of right foot with fat layer exposed: Secondary | ICD-10-CM | POA: Diagnosis not present

## 2016-11-01 NOTE — Patient Outreach (Signed)
[facility]) Care Management  11/01/2016   [name] 04/23/1933 [id number]  RN Health Coach telephone call to patient.  Hipaa compliance verified.Per patient when asked about COPD. Patient stated what is that?  When RN described what it was patient understood. Patient doesn't know when actually diagnosed. Patient stated he has never smoked. Patient does have panlobular emphysema, history of pneumonia. Patient is not on any inhalers, nebulizer or oxygen. Per patient today his oxygen saturation was 96% .  Per patient when he gets short of breath he just rests.  Per patient he had a rt  hip replacement and now he needs the other hip done because it is hurting real bad. Patient has agreed to receiving educational material on COPD. Per patient stated, "I love to read even though I read slow."   Current Medications:  Current Outpatient Prescriptions  Medication Sig Dispense Refill  . alendronate (FOSAMAX) 70 MG tablet Take 1 tablet (70 mg total) by mouth every 7 (seven) days. Take with a full glass of water on an empty stomach. 4 tablet 11  . CALCIUM PO Take 1 tablet by mouth daily.    . Cholecalciferol (VITAMIN D PO) Take 1 capsule by mouth daily.    . DOCOSAHEXAENOIC ACID PO Take 1 g by mouth.    . escitalopram (LEXAPRO) 10 MG tablet Take 1 tablet (10 mg total) by mouth daily. 30 tablet 5  . furosemide (LASIX) 40 MG tablet Take 2 tablets every morning (Patient taking differently: Take 80 mg by mouth daily as needed for fluid. ) 30 tablet 5  . magnesium hydroxide (MILK OF MAGNESIA) 400 MG/5ML suspension Take 15 mLs by mouth daily as needed for mild constipation.    Marland Kitchen. oxyCODONE-acetaminophen (PERCOCET) 7.5-325 MG tablet Take 1 tablet by mouth 3 (three) times daily as needed for severe pain. 90 tablet 0  . pantoprazole (PROTONIX) 40 MG tablet Take 1 tablet (40 mg total) by mouth daily. 90 tablet 3  . polyethylene glycol (MIRALAX / GLYCOLAX) packet Take 17 g by mouth 2 (two) times  daily.    . potassium chloride SA (K-DUR,KLOR-CON) 10 MEQ tablet Take 1 tablet (10 mEq total) by mouth daily. 30 tablet 5  . senna (SENOKOT) 8.6 MG TABS tablet Take 1 tablet (8.6 mg total) by mouth at bedtime.    . sucralfate (CARAFATE) 1 g tablet DISSOLVE 1 TABLET IN 2 OZ WATER AND DRINK BEFORE MEALS AND AT BEDTIMEFOR 2 WEEKS. 60 tablet 3  . verapamil (CALAN-SR) 120 MG CR tablet Take 1 tablet (120 mg total) by mouth at bedtime. 30 tablet 5  . warfarin (COUMADIN) 5 MG tablet Take 1 tablet (5 mg total) by mouth daily. 30 tablet 2   No current facility-administered medications for this visit.     Functional Status:  In your present state of health, do you have any difficulty performing the following activities: 11/01/2016 10/04/2016  Hearing? N N  Vision? N N  Difficulty concentrating or making decisions? N N  Walking or climbing stairs? Y Y  Dressing or bathing? N N  Doing errands, shopping? N N  Preparing Food and eating ? N -  Using the Toilet? N -  In the past six months, have you accidently leaked urine? Y -  Do you have problems with loss of bowel control? N -  Managing your Medications? N -  Managing your Finances? N -  Housekeeping or managing your Housekeeping? Y -  Some recent data might be hidden  Fall/Depression Screening: Fall Risk  11/01/2016 02/11/2014  Falls in the past year? No No  Risk for fall due to : Impaired balance/gait;Impaired mobility -   PHQ 2/9 Scores 11/01/2016  PHQ - 2 Score 0   [facility] CM Care Plan Problem One     Most Recent Value  Care Plan Problem One  Knowledge Deficit in Self Management of COPD  Care Plan for Problem One  Active  [facility] Long Term Goal (31-90 days)  Patient will not have any admission for COPD exacerbation within the next 90 days  [facility] Long Term Goal Start Date  11/01/16  Interventions for Problem One Long Term Goal  RN sent patient educational material on COPD. RN discussed acttion plan patient does with COPD symptoms  [facility] CM Short Term  Goal #1 (0-30 days)  Patient will be abkle to describe purselip breathing withn the next 30 days  [facility] CM Short Term Goal #1 Start Date  11/01/16  Interventions for Short Term Goal #1  RN sent educational picture sheet on purselip breathing. RN will follow up with discussion  [facility] CM Short Term Goal #2 (0-30 days)  Patient will understand the symptoms of COPD exacerbation, pnuemonia and cold and flu within the next 30 days   [facility] CM Short Term Goal #2 Start Date  11/01/16  Interventions for Short Term Goal #2  RN sent patient educational material on COPD exacerbation, Community acquired pneumonia, cold and flu symptoms. RN will follow up with patient on discussion      Assessment:  Patient will benefit from Health Coach telephonic outreach for education and support for COPD self management.  Plan:  RN discussed what COPD means to patient RN discussed the action plan patient uses when he gets short of breath RN sent educational material on COPD, Pneumonia, Cold and flu RN referred to pharmacy  RN will follow up within the month of June  Gean Maidens BSN RN [facility] Care Management [phone]

## 2016-11-02 DIAGNOSIS — L97518 Non-pressure chronic ulcer of other part of right foot with other specified severity: Secondary | ICD-10-CM | POA: Diagnosis not present

## 2016-11-02 DIAGNOSIS — I739 Peripheral vascular disease, unspecified: Secondary | ICD-10-CM | POA: Diagnosis not present

## 2016-11-02 DIAGNOSIS — I70261 Atherosclerosis of native arteries of extremities with gangrene, right leg: Secondary | ICD-10-CM | POA: Diagnosis not present

## 2016-11-02 DIAGNOSIS — L97514 Non-pressure chronic ulcer of other part of right foot with necrosis of bone: Secondary | ICD-10-CM | POA: Diagnosis not present

## 2016-11-02 DIAGNOSIS — L97519 Non-pressure chronic ulcer of other part of right foot with unspecified severity: Secondary | ICD-10-CM | POA: Diagnosis not present

## 2016-11-03 DIAGNOSIS — I130 Hypertensive heart and chronic kidney disease with heart failure and stage 1 through stage 4 chronic kidney disease, or unspecified chronic kidney disease: Secondary | ICD-10-CM | POA: Diagnosis not present

## 2016-11-03 DIAGNOSIS — L97512 Non-pressure chronic ulcer of other part of right foot with fat layer exposed: Secondary | ICD-10-CM | POA: Diagnosis not present

## 2016-11-03 DIAGNOSIS — I872 Venous insufficiency (chronic) (peripheral): Secondary | ICD-10-CM | POA: Diagnosis not present

## 2016-11-03 DIAGNOSIS — I82402 Acute embolism and thrombosis of unspecified deep veins of left lower extremity: Secondary | ICD-10-CM | POA: Diagnosis not present

## 2016-11-03 DIAGNOSIS — I5031 Acute diastolic (congestive) heart failure: Secondary | ICD-10-CM | POA: Diagnosis not present

## 2016-11-03 DIAGNOSIS — N189 Chronic kidney disease, unspecified: Secondary | ICD-10-CM | POA: Diagnosis not present

## 2016-11-05 DIAGNOSIS — N189 Chronic kidney disease, unspecified: Secondary | ICD-10-CM | POA: Diagnosis not present

## 2016-11-05 DIAGNOSIS — I130 Hypertensive heart and chronic kidney disease with heart failure and stage 1 through stage 4 chronic kidney disease, or unspecified chronic kidney disease: Secondary | ICD-10-CM | POA: Diagnosis not present

## 2016-11-05 DIAGNOSIS — I82402 Acute embolism and thrombosis of unspecified deep veins of left lower extremity: Secondary | ICD-10-CM | POA: Diagnosis not present

## 2016-11-05 DIAGNOSIS — L97512 Non-pressure chronic ulcer of other part of right foot with fat layer exposed: Secondary | ICD-10-CM | POA: Diagnosis not present

## 2016-11-05 DIAGNOSIS — I872 Venous insufficiency (chronic) (peripheral): Secondary | ICD-10-CM | POA: Diagnosis not present

## 2016-11-05 DIAGNOSIS — I5031 Acute diastolic (congestive) heart failure: Secondary | ICD-10-CM | POA: Diagnosis not present

## 2016-11-08 DIAGNOSIS — I872 Venous insufficiency (chronic) (peripheral): Secondary | ICD-10-CM | POA: Diagnosis not present

## 2016-11-08 DIAGNOSIS — I5031 Acute diastolic (congestive) heart failure: Secondary | ICD-10-CM | POA: Diagnosis not present

## 2016-11-08 DIAGNOSIS — L97512 Non-pressure chronic ulcer of other part of right foot with fat layer exposed: Secondary | ICD-10-CM | POA: Diagnosis not present

## 2016-11-08 DIAGNOSIS — I82402 Acute embolism and thrombosis of unspecified deep veins of left lower extremity: Secondary | ICD-10-CM | POA: Diagnosis not present

## 2016-11-08 DIAGNOSIS — I130 Hypertensive heart and chronic kidney disease with heart failure and stage 1 through stage 4 chronic kidney disease, or unspecified chronic kidney disease: Secondary | ICD-10-CM | POA: Diagnosis not present

## 2016-11-08 DIAGNOSIS — N189 Chronic kidney disease, unspecified: Secondary | ICD-10-CM | POA: Diagnosis not present

## 2016-11-09 ENCOUNTER — Ambulatory Visit: Payer: Medicare Other | Admitting: Family Medicine

## 2016-11-09 DIAGNOSIS — I82402 Acute embolism and thrombosis of unspecified deep veins of left lower extremity: Secondary | ICD-10-CM | POA: Diagnosis not present

## 2016-11-09 DIAGNOSIS — N189 Chronic kidney disease, unspecified: Secondary | ICD-10-CM | POA: Diagnosis not present

## 2016-11-09 DIAGNOSIS — I130 Hypertensive heart and chronic kidney disease with heart failure and stage 1 through stage 4 chronic kidney disease, or unspecified chronic kidney disease: Secondary | ICD-10-CM | POA: Diagnosis not present

## 2016-11-09 DIAGNOSIS — I5031 Acute diastolic (congestive) heart failure: Secondary | ICD-10-CM | POA: Diagnosis not present

## 2016-11-09 DIAGNOSIS — I872 Venous insufficiency (chronic) (peripheral): Secondary | ICD-10-CM | POA: Diagnosis not present

## 2016-11-09 DIAGNOSIS — L97512 Non-pressure chronic ulcer of other part of right foot with fat layer exposed: Secondary | ICD-10-CM | POA: Diagnosis not present

## 2016-11-10 ENCOUNTER — Ambulatory Visit: Payer: Medicare Other | Admitting: Family Medicine

## 2016-11-11 DIAGNOSIS — I82402 Acute embolism and thrombosis of unspecified deep veins of left lower extremity: Secondary | ICD-10-CM | POA: Diagnosis not present

## 2016-11-11 DIAGNOSIS — I5031 Acute diastolic (congestive) heart failure: Secondary | ICD-10-CM | POA: Diagnosis not present

## 2016-11-11 DIAGNOSIS — I130 Hypertensive heart and chronic kidney disease with heart failure and stage 1 through stage 4 chronic kidney disease, or unspecified chronic kidney disease: Secondary | ICD-10-CM | POA: Diagnosis not present

## 2016-11-11 DIAGNOSIS — L97512 Non-pressure chronic ulcer of other part of right foot with fat layer exposed: Secondary | ICD-10-CM | POA: Diagnosis not present

## 2016-11-11 DIAGNOSIS — I872 Venous insufficiency (chronic) (peripheral): Secondary | ICD-10-CM | POA: Diagnosis not present

## 2016-11-11 DIAGNOSIS — N189 Chronic kidney disease, unspecified: Secondary | ICD-10-CM | POA: Diagnosis not present

## 2016-11-15 ENCOUNTER — Other Ambulatory Visit: Payer: Self-pay | Admitting: Pharmacist

## 2016-11-15 NOTE — Patient Outreach (Signed)
[facility] Care Management  [facility]   11/15/2016  [name] CaiFred M [name] 09/26/1932 [id number]  Subjective:  Successful phone outreach to patient, HIPAA details verified.  [facility] Health Coach for medication review and cost concerns.   Patient reports he is interested in signing up for a Medicare Part D plan as he reports needing insurance help for his medications.  He reports he does not have prescription drug coverage and is not aware if he ever enrolled in Medicare Part D.    Offered a home visit to review his medications, and he reports he can do it over the phone.    Patient reports he used a "cream" that was >$200 that the wound center has now discontinued.  Patient reports he does have constipation at times.   Patient reports he thought potassium was taken to help with his blood thinner.   Objective:   Current Medications: Current Outpatient Prescriptions  Medication Sig Dispense Refill  . alendronate (FOSAMAX) 70 MG tablet Take 1 tablet (70 mg total) by mouth every 7 (seven) days. Take with a full glass of water on an empty stomach. 4 tablet 11  . CALCIUM PO Take 1 tablet by mouth daily.    . Cholecalciferol (VITAMIN D PO) Take 1 capsule by mouth daily.    . DOCOSAHEXAENOIC ACID PO Take 1 g by mouth.    . escitalopram (LEXAPRO) 10 MG tablet Take 1 tablet (10 mg total) by mouth daily. 30 tablet 5  . furosemide (LASIX) 40 MG tablet Take 2 tablets every morning (Patient taking differently: Take 80 mg by mouth daily as needed for fluid. ) 30 tablet 5  . magnesium hydroxide (MILK OF MAGNESIA) 400 MG/5ML suspension Take 15 mLs by mouth daily as needed for mild constipation.    Marland Kitchen. oxyCODONE-acetaminophen (PERCOCET) 7.5-325 MG tablet Take 1 tablet by mouth 3 (three) times daily as needed for severe pain. 90 tablet 0  . pantoprazole (PROTONIX) 40 MG tablet Take 1 tablet (40 mg total) by mouth daily. 90 tablet 3  . polyethylene  glycol (MIRALAX / GLYCOLAX) packet Take 17 g by mouth 2 (two) times daily.    . potassium chloride SA (K-DUR,KLOR-CON) 10 MEQ tablet Take 1 tablet (10 mEq total) by mouth daily. 30 tablet 5  . senna (SENOKOT) 8.6 MG TABS tablet Take 1 tablet (8.6 mg total) by mouth at bedtime.    . sucralfate (CARAFATE) 1 g tablet DISSOLVE 1 TABLET IN 2 OZ WATER AND DRINK BEFORE MEALS AND AT BEDTIMEFOR 2 WEEKS. 60 tablet 3  . verapamil (CALAN-SR) 120 MG CR tablet Take 1 tablet (120 mg total) by mouth at bedtime. 30 tablet 5  . warfarin (COUMADIN) 5 MG tablet Take 1 tablet (5 mg total) by mouth daily. 30 tablet 2   No current facility-administered medications for this visit.     Functional Status: In your present state of health, do you have any difficulty performing the following activities: 11/01/2016 10/04/2016  Hearing? N N  Vision? N N  Difficulty concentrating or making decisions? N N  Walking or climbing stairs? Y Y  Dressing or bathing? N N  Doing errands, shopping? N N  Preparing Food and eating ? N -  Using the Toilet? N -  In the past six months, have you accidently leaked urine? Y -  Do you have problems with loss of bowel control? N -  Managing your Medications? N -  Managing your Finances? N -  Housekeeping or managing your Housekeeping? Y -  Some recent data might be hidden    Fall/Depression Screening: Fall Risk  11/01/2016 02/11/2014  Falls in the past year? No No  Risk for fall due to : Impaired balance/gait;Impaired mobility -   PHQ 2/9 Scores 11/01/2016  PHQ - 2 Score 0    Assessment:  Medication assistance: Patient was provided with the [facility] phone number to contact to evaluate Medicare Part D plans in his area.   He was counseled he may have a late enrollment penalty from CMS in addition to monthly Part D premium due to not having prescription drug coverage.   He was counseled he may not be able to enroll in Part D plan until open enrollment in fall 2018 for plan year 2019.     Most of his medications appear to be generic, so manufacturer assistance is very limited.  He reports he is aware [employer] may offer some cost-savings but reports he likes his current pharmacy and they will deliver if needed per his report.   Medication review:  Per patient report medications include,   Drugs sorted by system:  Neurologic/Psychologic: -escitalopram---patient reports not taking  Cardiovascular: -furosemide  -potassium chloride  -verapamil -warfarin   Gastrointestinal: -magnesium hydroxide as needed -pantoprazole---reports he ran out of -sennosides---patient reports unsure if he is taking  Endocrine: -alendronate---patient reports he is not currently taking  Pain: -oxycodone/acetaminophen   Vitamins/Minerals: -calcium -vitamin D   Miscellaneous: -fish oil   Patient also reports taking naproxen OTC daily.     Drug interactions:  -NSAIDs and warfarin---patient was counseled on increased risk of bleeding/bruising when using NSAIDs and warfarin---and recommended to limit use of OTC NSAIDs while on warfarin.    Other issues noted:  Constipation---patient reports he is unsure if he is taking a daily bowel regimen---chronic narcotics as well as verapamil can cause constipation---suggest patient use a daily bowel regimen/stool softener to see if it helps his constipation.  There are multiple medications including alendronate, escitalopram, pantoprazole on medication list patient reports he is unsure if he is taking---recommend clarifying if patient should take these medications.    Patient counseling:  Patient was counseled that potassium is used to help maintain his potassium level due to him taking furosemide which can lower potassium level.      Plan:  Patient has declined home visit at this time.   Will place follow-up call to patient next month to see if he has further medication related questions.   Will route this note to PCP.    [name] [name], PharmD, [facility] Clinical Pharmacist [facility] [phone]

## 2016-11-16 ENCOUNTER — Telehealth: Payer: Self-pay | Admitting: Family Medicine

## 2016-11-16 ENCOUNTER — Encounter (HOSPITAL_COMMUNITY): Payer: Self-pay | Admitting: *Deleted

## 2016-11-16 ENCOUNTER — Emergency Department (HOSPITAL_COMMUNITY): Payer: Medicare Other

## 2016-11-16 ENCOUNTER — Observation Stay (HOSPITAL_COMMUNITY)
Admission: EM | Admit: 2016-11-16 | Discharge: 2016-11-17 | Disposition: A | Discharge status: Home / Self Care | Payer: Medicare Other | Attending: Family Medicine | Admitting: Family Medicine

## 2016-11-16 DIAGNOSIS — L97512 Non-pressure chronic ulcer of other part of right foot with fat layer exposed: Secondary | ICD-10-CM | POA: Diagnosis not present

## 2016-11-16 DIAGNOSIS — F321 Major depressive disorder, single episode, moderate: Secondary | ICD-10-CM

## 2016-11-16 DIAGNOSIS — Z87891 Personal history of nicotine dependence: Secondary | ICD-10-CM | POA: Diagnosis not present

## 2016-11-16 DIAGNOSIS — T814XXA Infection following a procedure, initial encounter: Secondary | ICD-10-CM | POA: Diagnosis present

## 2016-11-16 DIAGNOSIS — I130 Hypertensive heart and chronic kidney disease with heart failure and stage 1 through stage 4 chronic kidney disease, or unspecified chronic kidney disease: Secondary | ICD-10-CM | POA: Diagnosis not present

## 2016-11-16 DIAGNOSIS — J449 Chronic obstructive pulmonary disease, unspecified: Secondary | ICD-10-CM | POA: Insufficient documentation

## 2016-11-16 DIAGNOSIS — Y829 Unspecified medical devices associated with adverse incidents: Secondary | ICD-10-CM | POA: Diagnosis not present

## 2016-11-16 DIAGNOSIS — I872 Venous insufficiency (chronic) (peripheral): Secondary | ICD-10-CM | POA: Diagnosis not present

## 2016-11-16 DIAGNOSIS — L03115 Cellulitis of right lower limb: Principal | ICD-10-CM | POA: Insufficient documentation

## 2016-11-16 DIAGNOSIS — J45909 Unspecified asthma, uncomplicated: Secondary | ICD-10-CM | POA: Insufficient documentation

## 2016-11-16 DIAGNOSIS — M86171 Other acute osteomyelitis, right ankle and foot: Secondary | ICD-10-CM | POA: Insufficient documentation

## 2016-11-16 DIAGNOSIS — I10 Essential (primary) hypertension: Secondary | ICD-10-CM | POA: Diagnosis not present

## 2016-11-16 DIAGNOSIS — I129 Hypertensive chronic kidney disease with stage 1 through stage 4 chronic kidney disease, or unspecified chronic kidney disease: Secondary | ICD-10-CM | POA: Diagnosis not present

## 2016-11-16 DIAGNOSIS — I5031 Acute diastolic (congestive) heart failure: Secondary | ICD-10-CM | POA: Diagnosis not present

## 2016-11-16 DIAGNOSIS — Z8551 Personal history of malignant neoplasm of bladder: Secondary | ICD-10-CM | POA: Diagnosis not present

## 2016-11-16 DIAGNOSIS — N189 Chronic kidney disease, unspecified: Secondary | ICD-10-CM | POA: Insufficient documentation

## 2016-11-16 DIAGNOSIS — I480 Paroxysmal atrial fibrillation: Secondary | ICD-10-CM | POA: Diagnosis not present

## 2016-11-16 DIAGNOSIS — I82409 Acute embolism and thrombosis of unspecified deep veins of unspecified lower extremity: Secondary | ICD-10-CM | POA: Diagnosis present

## 2016-11-16 DIAGNOSIS — M86671 Other chronic osteomyelitis, right ankle and foot: Secondary | ICD-10-CM | POA: Diagnosis not present

## 2016-11-16 DIAGNOSIS — I82402 Acute embolism and thrombosis of unspecified deep veins of left lower extremity: Secondary | ICD-10-CM | POA: Diagnosis not present

## 2016-11-16 DIAGNOSIS — R600 Localized edema: Secondary | ICD-10-CM | POA: Diagnosis not present

## 2016-11-16 DIAGNOSIS — M869 Osteomyelitis, unspecified: Secondary | ICD-10-CM | POA: Diagnosis present

## 2016-11-16 LAB — CBC WITH DIFFERENTIAL/PLATELET
BASOS ABS: 0.1 10*3/uL (ref 0.0–0.1)
Basophils Relative: 1 %
Eosinophils Absolute: 0.7 10*3/uL (ref 0.0–0.7)
Eosinophils Relative: 10 %
HEMATOCRIT: 35.3 % — AB (ref 39.0–52.0)
HEMOGLOBIN: 11 g/dL — AB (ref 13.0–17.0)
LYMPHS PCT: 18 %
Lymphs Abs: 1.2 10*3/uL (ref 0.7–4.0)
MCH: 28.4 pg (ref 26.0–34.0)
MCHC: 31.2 g/dL (ref 30.0–36.0)
MCV: 91.2 fL (ref 78.0–100.0)
MONO ABS: 0.3 10*3/uL (ref 0.1–1.0)
MONOS PCT: 4 %
Neutro Abs: 4.6 10*3/uL (ref 1.7–7.7)
Neutrophils Relative %: 67 %
Platelets: 259 10*3/uL (ref 150–400)
RBC: 3.87 MIL/uL — ABNORMAL LOW (ref 4.22–5.81)
RDW: 14.6 % (ref 11.5–15.5)
WBC: 6.8 10*3/uL (ref 4.0–10.5)

## 2016-11-16 LAB — COMPREHENSIVE METABOLIC PANEL
ALBUMIN: 3.9 g/dL (ref 3.5–5.0)
ALT: 9 U/L — ABNORMAL LOW (ref 17–63)
AST: 14 U/L — ABNORMAL LOW (ref 15–41)
Alkaline Phosphatase: 93 U/L (ref 38–126)
Anion gap: 10 (ref 5–15)
BUN: 28 mg/dL — ABNORMAL HIGH (ref 6–20)
CO2: 25 mmol/L (ref 22–32)
Calcium: 9.1 mg/dL (ref 8.9–10.3)
Chloride: 103 mmol/L (ref 101–111)
Creatinine, Ser: 0.84 mg/dL (ref 0.61–1.24)
GFR calc Af Amer: >60 mL/min (ref >60)
GFR calc non Af Amer: >60 mL/min (ref >60)
GLUCOSE: 106 mg/dL — AB (ref 65–99)
POTASSIUM: 4.4 mmol/L (ref 3.5–5.1)
SODIUM: 138 mmol/L (ref 135–145)
Total Bilirubin: 0.5 mg/dL (ref 0.3–1.2)
Total Protein: 7.4 g/dL (ref 6.5–8.1)

## 2016-11-16 LAB — PROTIME-INR
INR: 2.37
Prothrombin Time: 26.4 seconds — ABNORMAL HIGH (ref 11.4–15.2)

## 2016-11-16 LAB — SEDIMENTATION RATE: SED RATE: 43 mm/h — AB (ref 0–16)

## 2016-11-16 MED ORDER — POLYETHYLENE GLYCOL 3350 17 G PO PACK
17.0000 g | PACK | Freq: Two times a day (BID) | ORAL | Status: DC
  Administered 2016-11-16 – 2016-11-17 (×2): 17 g via ORAL
  Filled 2016-11-16 (×2): qty 1

## 2016-11-16 MED ORDER — ACETAMINOPHEN 325 MG PO TABS: 650.0000 mg | ORAL_TABLET | Freq: Four times a day (QID) | ORAL | Status: DC | PRN

## 2016-11-16 MED ORDER — VANCOMYCIN HCL IN DEXTROSE 1-5 GM/200ML-% IV SOLN
1000.0000 mg | Freq: Once | INTRAVENOUS | Status: AC
  Administered 2016-11-16: 1000 mg via INTRAVENOUS
  Filled 2016-11-16: qty 200

## 2016-11-16 MED ORDER — DEXTROSE 5 % IV SOLN
2.0000 g | INTRAVENOUS | Status: DC
  Administered 2016-11-17: 2 g via INTRAVENOUS
  Filled 2016-11-16 (×2): qty 2

## 2016-11-16 MED ORDER — VERAPAMIL HCL ER 240 MG PO TBCR
120.0000 mg | EXTENDED_RELEASE_TABLET | Freq: Every day | ORAL | Status: DC
  Administered 2016-11-16: 120 mg via ORAL
  Filled 2016-11-16: qty 1

## 2016-11-16 MED ORDER — METRONIDAZOLE IN NACL 5-0.79 MG/ML-% IV SOLN
500.0000 mg | Freq: Three times a day (TID) | INTRAVENOUS | Status: DC
  Administered 2016-11-16 – 2016-11-17 (×2): 500 mg via INTRAVENOUS
  Filled 2016-11-16 (×2): qty 100

## 2016-11-16 MED ORDER — ACETAMINOPHEN 650 MG RE SUPP: 650.0000 mg | Freq: Four times a day (QID) | RECTAL | Status: DC | PRN

## 2016-11-16 MED ORDER — DEXTROSE 5 % IV SOLN
INTRAVENOUS | Status: AC
  Filled 2016-11-16: qty 2

## 2016-11-16 MED ORDER — ONDANSETRON HCL 4 MG/2ML IJ SOLN: 4.0000 mg | Freq: Four times a day (QID) | INTRAMUSCULAR | Status: DC | PRN

## 2016-11-16 MED ORDER — DIPHENHYDRAMINE HCL 25 MG PO CAPS
25.0000 mg | ORAL_CAPSULE | Freq: Three times a day (TID) | ORAL | Status: DC | PRN
  Administered 2016-11-16: 25 mg via ORAL
  Filled 2016-11-16: qty 1

## 2016-11-16 MED ORDER — ONDANSETRON HCL 4 MG PO TABS: 4.0000 mg | ORAL_TABLET | Freq: Four times a day (QID) | ORAL | Status: DC | PRN

## 2016-11-16 MED ORDER — MAGNESIUM HYDROXIDE 400 MG/5ML PO SUSP: 15.0000 mL | Freq: Every day | ORAL | Status: DC | PRN

## 2016-11-16 MED ORDER — POTASSIUM CHLORIDE CRYS ER 10 MEQ PO TBCR
10.0000 meq | EXTENDED_RELEASE_TABLET | Freq: Every day | ORAL | Status: DC
  Administered 2016-11-17: 10 meq via ORAL
  Filled 2016-11-16: qty 1

## 2016-11-16 MED ORDER — SENNA 8.6 MG PO TABS
1.0000 | ORAL_TABLET | Freq: Every day | ORAL | Status: DC
  Administered 2016-11-16: 8.6 mg via ORAL
  Filled 2016-11-16: qty 1

## 2016-11-16 MED ORDER — FENTANYL CITRATE (PF) 100 MCG/2ML IJ SOLN
25.0000 ug | Freq: Once | INTRAMUSCULAR | Status: AC
  Administered 2016-11-16: 25 ug via INTRAVENOUS
  Filled 2016-11-16: qty 2

## 2016-11-16 MED ORDER — FUROSEMIDE 80 MG PO TABS
80.0000 mg | ORAL_TABLET | Freq: Every day | ORAL | Status: DC
  Administered 2016-11-17: 80 mg via ORAL
  Filled 2016-11-16: qty 1

## 2016-11-16 MED ORDER — ONDANSETRON HCL 4 MG/2ML IJ SOLN
4.0000 mg | Freq: Once | INTRAMUSCULAR | Status: AC
  Administered 2016-11-16: 4 mg via INTRAVENOUS
  Filled 2016-11-16: qty 2

## 2016-11-16 MED ORDER — SODIUM CHLORIDE 0.9 % IV SOLN
INTRAVENOUS | Status: DC
  Administered 2016-11-16: 18:00:00 via INTRAVENOUS

## 2016-11-16 MED ORDER — OXYCODONE-ACETAMINOPHEN 7.5-325 MG PO TABS
1.0000 | ORAL_TABLET | Freq: Three times a day (TID) | ORAL | Status: DC | PRN
  Administered 2016-11-16 – 2016-11-17 (×2): 1 via ORAL
  Filled 2016-11-16 (×2): qty 1

## 2016-11-16 NOTE — ED Triage Notes (Signed)
Pt c/o sores on his right foot that has gotten worse over the last week. Pt reports he has been treated for foot sores on the right foot over the past year. Denies fever. Pt also has blistering to left leg.

## 2016-11-16 NOTE — ED Provider Notes (Addendum)
[facility] Provider Note   CSN: [id number] Arrival date & time: 11/16/16  1407     History   Chief Complaint Chief Complaint  Patient presents with  . Wound Infection    HPI [name] [name] is a 83 y.o. male.  Patient sent in by home nurse. Patient has a history of right foot nonhealing ulcers followed by wound care in [place]. Home nurse felt that the right foot was showing signs of worsening infection today. Last seen by wound care about 2 weeks ago. He thought things were improving. Also reviewed primary care doctor's office notes. With there was some concern that maybe the right foot would have to be amputated but not clear that there is arterial in insufficiency to the right foot. Patient is on Coumadin. Patient with complaint of right foot pain redness and swelling.      Past Medical History:  Diagnosis Date  . Alcohol abuse    stop drinking since 01/2009  . Arthritis   . Asthma   . Atrial fibrillation (HCC)    off coumadin secondary to fall and subdural  hematoma   . B12 deficiency 9/10   started injection   . BPH (benign prostatic hyperplasia)   . Carcinoma in situ of bladder 2014   Dr. Annabell Howells  . Chronic kidney disease    RECENT UTI FEW MONTHS AGO TX WITH ANTIBIOTIC  . Compression fracture   . COPD (chronic obstructive pulmonary disease) (HCC)   . Diverticula of colon    few small scattered in the sigmoid colon  . Enlarged prostate   . External hemorrhoids 09/2014  . GERD (gastroesophageal reflux disease)   . H/O hiatal hernia   . Headache(784.0)   . Hypertension   . Nutcracker esophagus 2002   on EM  . Osteoporosis   . Peripheral edema   . Reflux   . Venous stasis    chronic     Patient Active Problem List   Diagnosis Date Noted  . Pressure injury of skin 10/06/2016  . Acute diastolic CHF (congestive heart failure) (HCC) 10/06/2016  . Left leg DVT (HCC) 10/04/2016  . CAP (community acquired pneumonia) 10/04/2016  . AF (paroxysmal atrial  fibrillation) (HCC) 10/04/2016  . Acute respiratory failure with hypoxia (HCC) 10/04/2016  . Depression, major, single episode, moderate (HCC) 08/28/2016  . Achalasia 12/09/2015  . Atherosclerosis of native arteries of extremity with rest pain (HCC) 10/21/2015  . Acute esophagitis   . Congestive heart failure, unspecified 01/01/2014  . Chronic pain syndrome 10/10/2013  . S/P total hip arthroplasty 05/14/2013  . Osteoporosis, unspecified 04/23/2013  . Chronic atrial fibrillation (HCC) 09/19/2012  . Essential hypertension, benign 09/19/2012  . Status post hip replacement 04/12/2012  . Spinal stenosis of lumbar region 05/03/2011  . TOTAL HIP FOLLOW-UP 09/13/2010  . NONUNION OF FRACTURE 07/08/2010  . HIP FRACTURE, RIGHT 07/08/2010  . ALCOHOL ABUSE 05/15/2009  . ESOPHAGEAL MOTILITY DISORDER 05/15/2009  . GERD 05/15/2009  . Constipation 05/15/2009  . WEIGHT LOSS, RECENT 05/15/2009  . EXTERNAL HEMORRHOIDS 05/13/2009  . COPD (chronic obstructive pulmonary disease) (HCC) 05/13/2009  . Dysphagia 05/13/2009  . COMPRESSION FRACTURE, SPINE 03/19/2009  . HIGH BLOOD PRESSURE 03/19/2009    Past Surgical History:  Procedure Laterality Date  . BLADDER ASPIRATION     PROC FOR CANCER CELLS   . CATARACT EXTRACTION W/PHACO  07/30/2012   Procedure: CATARACT EXTRACTION PHACO AND INTRAOCULAR LENS PLACEMENT (IOC);  Surgeon: Susa Simmonds, MD;  Location: [facility] ORS;  Service:  Ophthalmology;  Laterality: Left;  CDE:31.66  . CATARACT EXTRACTION W/PHACO Right 11/12/2012   Procedure: CATARACT EXTRACTION PHACO AND INTRAOCULAR LENS PLACEMENT (IOC);  Surgeon: Susa Simmonds, MD;  Location: [facility];  Service: Ophthalmology;  Laterality: Right;  CDE 18.25  . CYSTOSCOPY  10/18/2011   Procedure: CYSTOSCOPY;  Surgeon: Anner Crete, MD;  Location: [facility];  Service: Urology;  Laterality: N/A;  . ESOPHAGOGASTRODUODENOSCOPY  02/11/2010   RMR: GE narrowing s/p dilation 56 & 58 Fr maloney otherwise normal  .  ESOPHAGOGASTRODUODENOSCOPY N/A 10/01/2015   Dr.Rourk- mildly severe esophagitis, likely pill-induced, narrowed GE junction without fixed lesion found ?query achalasia, s/p maloney dilation, nomal stomach, normal second portion of the duodenum  . HERNIA REPAIR  12 YRS AGO   RIGHT SIDE   . JOINT REPLACEMENT     RT HIP X2  (1 REPLACEMENT)  . LUNG BIOPSY  10 YRS AGO   BENIGN  . MALONEY DILATION N/A 10/01/2015   Procedure: [name] DILATION;  Surgeon: Corbin Ade, MD;  Location: [facility] SUITE;  Service: Endoscopy;  Laterality: N/A;  . PERIPHERAL VASCULAR CATHETERIZATION N/A 10/23/2015   Procedure: Abdominal Aortogram w/Lower Extremity;  Surgeon: Sherren Kerns, MD;  Location: [facility] INVASIVE CV LAB;  Service: Cardiovascular;  Laterality: N/A;  . RHINOPLASTY  1974       Home Medications    Prior to Admission medications   Medication Sig Start Date End Date Taking? Authorizing Provider  CALCIUM PO Take 1 tablet by mouth daily.   Yes [provider]  Docosahexaenoic Acid (DHA COMPLETE PO) Take 1 capsule by mouth daily.   Yes [provider]  furosemide (LASIX) 40 MG tablet Take 2 tablets every morning Patient taking differently: Take 80 mg by mouth daily.  01/15/16  Yes Babs Sciara, MD  magnesium hydroxide (MILK OF MAGNESIA) 400 MG/5ML suspension Take 15 mLs by mouth daily as needed for mild constipation.   Yes [provider]  oxyCODONE-acetaminophen (PERCOCET) 7.5-325 MG tablet Take 1 tablet by mouth 3 (three) times daily as needed for severe pain. 10/26/16  Yes Merlyn Albert, MD  polyethylene glycol [facility] / Ethelene Hal) packet Take 17 g by mouth 2 (two) times daily. Patient not taking: Reported on 11/16/2016 10/08/16  Yes Standley Brooking, MD  potassium chloride SA (K-DUR,KLOR-CON) 10 MEQ tablet Take 1 tablet (10 mEq total) by mouth daily. 10/19/16  Yes Merlyn Albert, MD  PSYLLIUM PO Take 1 packet by mouth daily.   Yes [provider]  senna (SENOKOT)  8.6 MG TABS tablet Take 1 tablet (8.6 mg total) by mouth at bedtime. 10/08/16  Yes Standley Brooking, MD  verapamil (CALAN-SR) 120 MG CR tablet Take 1 tablet (120 mg total) by mouth at bedtime. 10/26/16  Yes Merlyn Albert, MD  warfarin (COUMADIN) 5 MG tablet Take 1 tablet (5 mg total) by mouth daily. 10/12/16  Yes Merlyn Albert, MD  escitalopram (LEXAPRO) 10 MG tablet Take 1 tablet (10 mg total) by mouth daily. Patient not taking: Reported on 11/16/2016 08/24/16 08/24/17  Merlyn Albert, MD  sucralfate (CARAFATE) 1 g tablet DISSOLVE 1 TABLET IN 2 OZ WATER AND DRINK BEFORE MEALS AND AT BEDTIMEFOR 2 WEEKS. Patient not taking: Reported on 11/16/2016 02/04/16   Anice Paganini, NP    Family History Family History  Problem Relation Age of Onset  . Cancer Brother   . Hypertension Mother   . Diabetes Mother   . Heart attack Mother   .  Heart disease Mother     Social History Social History  Substance Use Topics  . Smoking status: Never Smoker  . Smokeless tobacco: Former Neurosurgeon    Types: Chew    Quit date: 06/28/1991  . Alcohol use No     Comment: history of alcohol abuse stop drinking 8/10 . Alcohol abuse for about 25 years      Allergies   Sulfonamide derivatives and Tramadol   Review of Systems Review of Systems  Constitutional: Negative for fever.  HENT: Negative for congestion.   Eyes: Negative for redness.  Respiratory: Negative for shortness of breath.   Cardiovascular: Negative for chest pain.  Gastrointestinal: Positive for abdominal distention. Negative for abdominal pain.  Genitourinary: Negative for dysuria.  Musculoskeletal: Negative for back pain.  Skin: Positive for wound.  Neurological: Negative for headaches.  Hematological: Bruises/bleeds easily.  Psychiatric/Behavioral: Negative for confusion.     Physical Exam Updated Vital Signs BP (!) 151/83   Pulse (!) 53   Temp 97.4 F (36.3 C) (Oral)   Resp (!) 22   Ht 1.803 m (5\' 11" )   Wt 83.5 kg (184 lb)    SpO2 98%   BMI 25.66 kg/m   Physical Exam  Constitutional: He appears well-developed and well-nourished. No distress.  HENT:  Head: Normocephalic and atraumatic.  Mouth/Throat: Oropharynx is clear and moist.  Eyes: Conjunctivae and EOM are normal. Pupils are equal, round, and reactive to light.  Neck: Normal range of motion. Neck supple.  Cardiovascular: Normal rate, regular rhythm and normal heart sounds.   Pulmonary/Chest: Effort normal.  Abdominal: Soft. Bowel sounds are normal. He exhibits distension. There is no tenderness.  Musculoskeletal: Normal range of motion. He exhibits edema and tenderness.  Chronic skin changes to both lower legs. There was some breakdown of the skin. Some slight erythema there. Right foot with significant erythema to the forefoot area. Not so much on the sole. Several chronic wounds a large one on the anterior part of the forefoot measuring about 4 x 2 cm. Also wound to the anterior aspect of the big and third toe. On the big toe involving the nailbed which is gone. And on the sole part of the foot underneath the little toe. At measuring about 1 cm. Refill to the right foot is less than 2 seconds.  All of the wounds are dry. No evidence of necrotic tissue. No deep structure exposure.  Neurological: He is alert. No cranial nerve deficit or sensory deficit. He exhibits normal muscle tone. Coordination normal.  Skin: Capillary refill takes less than 2 seconds.  Nursing note and vitals reviewed.    ED Treatments / Results  Labs (all labs ordered are listed, but only abnormal results are displayed) Labs Reviewed  COMPREHENSIVE METABOLIC PANEL - Abnormal; Notable for the following:       Result Value   Glucose, Bld 106 (*)    BUN 28 (*)    AST 14 (*)    ALT 9 (*)    All other components within normal limits  CBC WITH DIFFERENTIAL/PLATELET - Abnormal; Notable for the following:    RBC 3.87 (*)    Hemoglobin 11.0 (*)    HCT 35.3 (*)    All other  components within normal limits  PROTIME-INR - Abnormal; Notable for the following:    Prothrombin Time 26.4 (*)    All other components within normal limits   Results for orders placed or performed during the hospital encounter of 11/16/16  Comprehensive metabolic  panel  Result Value Ref Range   Sodium 138 135 - 145 mmol/L   Potassium 4.4 3.5 - 5.1 mmol/L   Chloride 103 101 - 111 mmol/L   CO2 25 22 - 32 mmol/L   Glucose, Bld 106 (H) 65 - 99 mg/dL   BUN 28 (H) 6 - 20 mg/dL   Creatinine, Ser 1.610.84 0.61 - 1.24 mg/dL   Calcium 9.1 8.9 - 09.610.3 mg/dL   Total Protein 7.4 6.5 - 8.1 g/dL   Albumin 3.9 3.5 - 5.0 g/dL   AST 14 (L) 15 - 41 U/L   ALT 9 (L) 17 - 63 U/L   Alkaline Phosphatase 93 38 - 126 U/L   Total Bilirubin 0.5 0.3 - 1.2 mg/dL   GFR calc non Af Amer >60 >60 mL/min   GFR calc Af Amer >60 >60 mL/min   Anion gap 10 5 - 15  CBC with Differential/Platelet  Result Value Ref Range   WBC 6.8 4.0 - 10.5 K/uL   RBC 3.87 (L) 4.22 - 5.81 MIL/uL   Hemoglobin 11.0 (L) 13.0 - 17.0 g/dL   HCT 04.535.3 (L) 40.939.0 - 81.152.0 %   MCV 91.2 78.0 - 100.0 fL   MCH 28.4 26.0 - 34.0 pg   MCHC 31.2 30.0 - 36.0 g/dL   RDW 91.414.6 78.211.5 - 95.615.5 %   Platelets 259 150 - 400 K/uL   Neutrophils Relative % 67 %   Neutro Abs 4.6 1.7 - 7.7 K/uL   Lymphocytes Relative 18 %   Lymphs Abs 1.2 0.7 - 4.0 K/uL   Monocytes Relative 4 %   Monocytes Absolute 0.3 0.1 - 1.0 K/uL   Eosinophils Relative 10 %   Eosinophils Absolute 0.7 0.0 - 0.7 K/uL   Basophils Relative 1 %   Basophils Absolute 0.1 0.0 - 0.1 K/uL  Protime-INR  Result Value Ref Range   Prothrombin Time 26.4 (H) 11.4 - 15.2 seconds   INR 2.37      EKG  EKG Interpretation  Date/Time:  Wednesday Nov 16 2016 20:06:34 EDT Ventricular Rate:  63 PR Interval:    QRS Duration: 132 QT Interval:  441 QTC Calculation: 452 R Axis:   51 Text Interpretation:  Atrial fibrillation Right bundle branch block Confirmed by Vanetta MuldersZackowski, Tristan Proto [phone]) on 11/16/2016 8:10:16  PM       Radiology Dg Foot Complete Right  Result Date: 11/16/2016 CLINICAL DATA:  Patient reports source on his right foot that have gotten progressively worse. Cellulitis. EXAM: RIGHT FOOT COMPLETE - 3+ VIEW COMPARISON:  03/25/2016 FINDINGS: There is diffuse soft tissue swelling. The bones are diffusely osteopenic. There is soft tissue ulceration along the dorsum of the forefoot. There is evidence of periarticular bony destruction involving the third a PIP joint compatible with osteomyelitis. There may also the focal demineralization involving the anterior malleolus of the distal tibia. IMPRESSION: Osseous destruction about the third PIP joint compatible with osteomyelitis. Additionally there may be focal osseous demineralization involving the anterior malleolus of the distal tibia. Consider further evaluation with MRI of the foot with contrast material. Diffuse soft tissue edema and dorsal fall tissue ulceration. Electronically Signed   By: Signa Kellaylor  Stroud M.D.   On: 11/16/2016 16:33    Procedures Procedures (including critical care time)  Medications Ordered in ED Medications  0.9 %  sodium chloride infusion ( Intravenous New Bag/Given 11/16/16 1803)  vancomycin (VANCOCIN) IVPB 1000 mg/200 mL premix (0 mg Intravenous Stopped 11/16/16 1911)  ondansetron (ZOFRAN) injection 4 mg (4 mg  Intravenous Given 11/16/16 1802)  fentaNYL (SUBLIMAZE) injection 25 mcg (25 mcg Intravenous Given 11/16/16 1802)     Initial Impression / Assessment and Plan / ED Course  I have reviewed the triage vital signs and the nursing notes.  Pertinent labs & imaging results that were available during my care of the patient were reviewed by me and considered in my medical decision making (see chart for details).   x-ray of his right foot shows evidence of osteomyelitis in the third toe. Patient also with chronic wounds and cellulitis to the forefoot. Review of his records from his primary care is some suggestion that  they were thinking in terms of amputation the patient seems to have pretty good blood flow to the foot at least based on capillary refill is quite brisk. Certainly less than 2 seconds. I feel the patient needs a reevaluation for the cellulitis treatment for the cellulitis. Given 1 dose of IV vancomycin here. Patient followed by wound care clinic in [place] unable to see any their notes. I saw him a couple weeks ago but not due to see him again for another 2 months apparently things were showing signs of improvement at that time. Home nurse felt that things were getting worse today.  Patient's EKG does show atrial fibrillation. Patient with a known history of each were fibrillation. Patient is adequately anticoagulated on Coumadin.  Final Clinical Impressions(s) / ED Diagnoses   Final diagnoses:  Cellulitis of right foot  Other acute osteomyelitis of right foot [facility])    New Prescriptions New Prescriptions   No medications on file     Vanetta Mulders, MD 11/16/16 Vinnie Langton    Vanetta Mulders, MD 11/16/16 2011

## 2016-11-16 NOTE — Telephone Encounter (Signed)
[name] from [facility] called and feels that patient is not taking Coumadin as he should.  INR 3.4 today. States that he was told on May 7th take 5 mg M/W/F/Sun 7.5 T/TH. He has been taking 7.5 on Thursday and 5 mg on M/T/W/F/Sat/Sun. Sent to ER today for wound on foot. It is Hot and Red.  [phone]

## 2016-11-16 NOTE — ED Notes (Signed)
EKG done and seen by Dr Deretha EmoryZackowski. Patient on 12 lead. EKG not done at until now due to patient wanting to go home and not admitted. Patient is being admitted at this time.

## 2016-11-16 NOTE — Telephone Encounter (Signed)
[name] She was given the new instructions for Coumadin.

## 2016-11-16 NOTE — Telephone Encounter (Signed)
Back off to five all days , re ck in two wks, foot is a mess and pt at risk for serious complication so support e r visit

## 2016-11-16 NOTE — Telephone Encounter (Signed)
Patient currently in ER- Left message to return call with home health nurse

## 2016-11-16 NOTE — ED Notes (Signed)
Admitting physician at bedside

## 2016-11-16 NOTE — H&P (Signed)
History and Physical    [name] [name] TKZ:[id number] DOB: January 06, 1933 DOA: 11/16/2016  PCP: Mikey Kirschner, MD Consultants:  Nils Pyle - wound care Patient coming from: home - lives with wife (dementia); NOK: sons, [phone]; [phone]  Chief Complaint: foot ulcers  HPI: [name] [name] is a 83 y.o. male with medical history significant of PVD, venous stasis, HTN, COPD (not on home O2), CKD, afib - and appears to be on Coumadin despite h/o fall with subdural hematoma.  He reports that he is here today "'cause this foot has gone from bad, got worse the last 2 days."  He has a h/o right foot osteomyelitis and has been told he needs amputation but has refused to date.  On the other leg, blisters have been developing and "when they burst they drain real bad".  Right foot has been having problems for 1-2 years.  "Very possible I'm gonna lost that foot."  "I'd just soon be dead as cut that foot off."  "I won't agree yet to have that leg cut off."    "It was doing good until those blisters start coming out."  Was hoping he could be treated with antibiotics, preferably oral.   ED Course:  Foot xray with osteo.  Suggest treatment of cellulitis.  Given IV Vanc.  EKG with afib, on Coumadin.  Review of Systems: As per HPI; otherwise review of systems reviewed and negative.   Ambulatory Status:  Ambulates with a cane  Past Medical History:  Diagnosis Date  . Alcohol abuse    stop drinking since 01/2009  . Arthritis   . Asthma   . Atrial fibrillation (HCC)    off coumadin secondary to fall and subdural  hematoma   . B12 deficiency 9/10   started injection   . BPH (benign prostatic hyperplasia)   . Carcinoma in situ of bladder 2014   Dr. Jeffie Pollock  . Chronic kidney disease    RECENT UTI FEW MONTHS AGO TX WITH ANTIBIOTIC  . Compression fracture   . COPD (chronic obstructive pulmonary disease) ([place])   . Diverticula of colon    few small scattered in the sigmoid colon  . Enlarged prostate   . External  hemorrhoids 09/2014  . GERD (gastroesophageal reflux disease)   . H/O hiatal hernia   . Headache(784.0)   . Hypertension   . Nutcracker esophagus 2002   on EM  . Osteoporosis   . Peripheral edema   . Reflux   . Venous stasis    chronic     Past Surgical History:  Procedure Laterality Date  . BLADDER ASPIRATION     PROC FOR CANCER CELLS   . CATARACT EXTRACTION W/PHACO  07/30/2012   Procedure: CATARACT EXTRACTION PHACO AND INTRAOCULAR LENS PLACEMENT (IOC);  Surgeon: Williams Che, MD;  Location: [facility];  Service: Ophthalmology;  Laterality: Left;  CDE:31.66  . CATARACT EXTRACTION W/PHACO Right 11/12/2012   Procedure: CATARACT EXTRACTION PHACO AND INTRAOCULAR LENS PLACEMENT (IOC);  Surgeon: Williams Che, MD;  Location: [facility];  Service: Ophthalmology;  Laterality: Right;  CDE 18.25  . CYSTOSCOPY  10/18/2011   Procedure: CYSTOSCOPY;  Surgeon: Malka So, MD;  Location: [facility];  Service: Urology;  Laterality: N/A;  . ESOPHAGOGASTRODUODENOSCOPY  02/11/2010   RMR: GE narrowing s/p dilation 56 & 73 Fr maloney otherwise normal  . ESOPHAGOGASTRODUODENOSCOPY N/A 10/01/2015   Dr.Rourk- mildly severe esophagitis, likely pill-induced, narrowed GE junction without fixed lesion found ?query achalasia, s/p maloney dilation, nomal stomach,  normal second portion of the duodenum  . HERNIA REPAIR  12 YRS AGO   RIGHT SIDE   . JOINT REPLACEMENT     RT HIP X2  (1 REPLACEMENT)  . LUNG BIOPSY  10 YRS AGO   BENIGN  . MALONEY DILATION N/A 10/01/2015   Procedure: Venia Minks DILATION;  Surgeon: Daneil Dolin, MD;  Location: [facility];  Service: Endoscopy;  Laterality: N/A;  . PERIPHERAL VASCULAR CATHETERIZATION N/A 10/23/2015   Procedure: Abdominal Aortogram w/Lower Extremity;  Surgeon: Elam Dutch, MD;  Location: [facility];  Service: Cardiovascular;  Laterality: N/A;  . RHINOPLASTY  1974    Social History   Social History  . Marital status: Married    Spouse name: N/A  . Number of  children: 2  . Years of education: N/A   Occupational History  . Not on file.   Social History Main Topics  . Smoking status: Never Smoker  . Smokeless tobacco: Former Systems developer    Types: Chew    Quit date: 06/28/1991  . Alcohol use No     Comment: history of alcohol abuse stop drinking 8/10 . Alcohol abuse for about 25 years   . Drug use: No  . Sexual activity: Not on file   Other Topics Concern  . Not on file   Social History Narrative  . No narrative on file    Allergies  Allergen Reactions  . Sulfonamide Derivatives Hives  . Tramadol Itching    Family History  Problem Relation Age of Onset  . Cancer Brother   . Hypertension Mother   . Diabetes Mother   . Heart attack Mother   . Heart disease Mother     Prior to Admission medications   Medication Sig Start Date End Date Taking? Authorizing Provider  CALCIUM PO Take 1 tablet by mouth daily.   Yes [provider]  Docosahexaenoic Acid (DHA COMPLETE PO) Take 1 capsule by mouth daily.   Yes [provider]  furosemide (LASIX) 40 MG tablet Take 2 tablets every morning Patient taking differently: Take 80 mg by mouth daily.  01/15/16  Yes Kathyrn Drown, MD  magnesium hydroxide (MILK OF MAGNESIA) 400 MG/5ML suspension Take 15 mLs by mouth daily as needed for mild constipation.   Yes [provider]  oxyCODONE-acetaminophen (PERCOCET) 7.5-325 MG tablet Take 1 tablet by mouth 3 (three) times daily as needed for severe pain. 10/26/16  Yes Mikey Kirschner, MD  polyethylene glycol [facility] / Floria Raveling) packet Take 17 g by mouth 2 (two) times daily. Patient not taking: Reported on 11/16/2016 10/08/16  Yes Samuella Cota, MD  potassium chloride SA (K-DUR,KLOR-CON) 10 MEQ tablet Take 1 tablet (10 mEq total) by mouth daily. 10/19/16  Yes Mikey Kirschner, MD  PSYLLIUM PO Take 1 packet by mouth daily.   Yes [provider]  senna (SENOKOT) 8.6 MG TABS tablet Take 1 tablet (8.6 mg total) by mouth at  bedtime. 10/08/16  Yes Samuella Cota, MD  verapamil (CALAN-SR) 120 MG CR tablet Take 1 tablet (120 mg total) by mouth at bedtime. 10/26/16  Yes Mikey Kirschner, MD  warfarin (COUMADIN) 5 MG tablet Take 1 tablet (5 mg total) by mouth daily. 10/12/16  Yes Mikey Kirschner, MD  escitalopram (LEXAPRO) 10 MG tablet Take 1 tablet (10 mg total) by mouth daily. Patient not taking: Reported on 11/16/2016 08/24/16 08/24/17  Mikey Kirschner, MD  sucralfate (CARAFATE) 1 g tablet DISSOLVE 1 TABLET  IN 2 OZ WATER AND DRINK BEFORE MEALS AND AT BEDTIMEFOR 2 WEEKS. Patient not taking: Reported on 11/16/2016 02/04/16   Carlis Stable, NP    Physical Exam: Vitals:   11/16/16 1413 11/16/16 1414 11/16/16 1800 11/16/16 1803  BP: 132/62  (!) 151/83   Pulse: (!) 53     Resp:    (!) 22  Temp: 97.4 F (36.3 C)     TempSrc: Oral     SpO2: 98%   98%  Weight:  83.5 kg (184 lb)    Height:  5' 11"  (1.803 m)       General:  Appears calm and comfortable and is NAD Eyes:  PERRL, EOMI, normal lids, iris ENT:  grossly normal hearing, lips & tongue, mmm Neck:  no LAD, masses or thyromegaly Cardiovascular:  RRR, no m/r/g. No LE edema.  Respiratory:  CTA bilaterally, no w/r/r. Normal respiratory effort. Abdomen:  soft, ntnd, NABS Skin:  Marked stasis dermatitis of the bilateral LE with weeping and ulcerations and lichenification Musculoskeletal:  Evidence of extremely poor B LE circulation.  The right first and 3rd toes are necrotic (3rd >1st) with diffuse erythema resulting from stasis > cellulitis Psychiatric:  grossly normal mood and affect, speech fluent and appropriate, AOx3 Neurologic:  CN 2-12 grossly intact, moves all extremities in coordinated fashion, sensation intact  Labs on Admission: I have personally reviewed following labs and imaging studies  CBC:  Recent Labs Lab 11/16/16 1752  WBC 6.8  NEUTROABS 4.6  HGB 11.0*  HCT 35.3*  MCV 91.2  PLT 546   Basic Metabolic Panel:  Recent Labs Lab  11/16/16 1752  NA 138  K 4.4  CL 103  CO2 25  GLUCOSE 106*  BUN 28*  CREATININE 0.84  CALCIUM 9.1   GFR: Estimated Creatinine Clearance: 72.2 mL/min (by C-G formula based on SCr of 0.84 mg/dL). Liver Function Tests:  Recent Labs Lab 11/16/16 1752  AST 14*  ALT 9*  ALKPHOS 93  BILITOT 0.5  PROT 7.4  ALBUMIN 3.9   No results for input(s): LIPASE, AMYLASE in the last 168 hours. No results for input(s): AMMONIA in the last 168 hours. Coagulation Profile:  Recent Labs Lab 11/16/16 1752  INR 2.37   Cardiac Enzymes: No results for input(s): CKTOTAL, CKMB, CKMBINDEX, TROPONINI in the last 168 hours. BNP (last 3 results) No results for input(s): PROBNP in the last 8760 hours. HbA1C: No results for input(s): HGBA1C in the last 72 hours. CBG: No results for input(s): GLUCAP in the last 168 hours. Lipid Profile: No results for input(s): CHOL, HDL, LDLCALC, TRIG, CHOLHDL, LDLDIRECT in the last 72 hours. Thyroid Function Tests: No results for input(s): TSH, T4TOTAL, FREET4, T3FREE, THYROIDAB in the last 72 hours. Anemia Panel: No results for input(s): VITAMINB12, FOLATE, FERRITIN, TIBC, IRON, RETICCTPCT in the last 72 hours. Urine analysis:    Component Value Date/Time   COLORURINE YELLOW 11/27/2015 1028   APPEARANCEUR CLEAR 11/27/2015 1028   LABSPEC >1.030 (H) 11/27/2015 1028   PHURINE 5.5 11/27/2015 1028   GLUCOSEU NEGATIVE 11/27/2015 1028   HGBUR NEGATIVE 11/27/2015 1028   BILIRUBINUR SMALL (A) 11/27/2015 1028   KETONESUR TRACE (A) 11/27/2015 1028   PROTEINUR TRACE (A) 11/27/2015 1028   UROBILINOGEN 0.2 02/21/2011 1842   NITRITE NEGATIVE 11/27/2015 1028   LEUKOCYTESUR NEGATIVE 11/27/2015 1028    Creatinine Clearance: Estimated Creatinine Clearance: 72.2 mL/min (by C-G formula based on SCr of 0.84 mg/dL).  Sepsis Labs: @LABRCNTIP (procalcitonin:4,lacticidven:4) )No results found for this or  any previous visit (from the past 240 hour(s)).   Radiological Exams  on Admission: Dg Foot Complete Right  Result Date: 11/16/2016 CLINICAL DATA:  Patient reports source on his right foot that have gotten progressively worse. Cellulitis. EXAM: RIGHT FOOT COMPLETE - 3+ VIEW COMPARISON:  03/25/2016 FINDINGS: There is diffuse soft tissue swelling. The bones are diffusely osteopenic. There is soft tissue ulceration along the dorsum of the forefoot. There is evidence of periarticular bony destruction involving the third a PIP joint compatible with osteomyelitis. There may also the focal demineralization involving the anterior malleolus of the distal tibia. IMPRESSION: Osseous destruction about the third PIP joint compatible with osteomyelitis. Additionally there may be focal osseous demineralization involving the anterior malleolus of the distal tibia. Consider further evaluation with MRI of the foot with contrast material. Diffuse soft tissue edema and dorsal fall tissue ulceration. Electronically Signed   By: Kerby Moors M.D.   On: 11/16/2016 16:33    EKG: Independently reviewed.  Afib with rate 63; RBBB with no evidence of acute ischemia  Assessment/Plan Principal Problem:   Osteomyelitis (HCC) Active Problems:   Depression, major, single episode, moderate (HCC)   AF (paroxysmal atrial fibrillation) (HCC)   Osteomyelitis -Patient presenting with known h/o osteomyelitis who has been resistant to agree to recommended treatment (BKA) -He has been followed by the wound clinic in [place] with some limited success -Unfortunately, the underlying problem of PVD is not curable without amputation and so the victories will be short-lived -He had an aortogram in 4/17 that showed "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 Scot Dock in 12/17.  His assessment was: "I would agree with Dr.  Oneida Alar assessment that he is not a candidate for revascularization. I think the only option is primary right below-knee amputation. I discussed this with the patient and his son today and currently he is not agreeable to proceed." -Based on >1 year of ongoing issues, it seems exceedingly unlikely that short-term antibiotic therapy will accomplish anything.   -Even a longer duration of therapy, 4-6 weeks, is unlikely to demonstrate meaningful recovery since his vascular supply is so compromised as to prohibit wound healing. -I spent a very long time discussing this at length with the patient and his sons.  -CRP and ESR are pending. -Normal WBC count. -Initial evaluation with plain film did show osteo of the 3rd PIP, possibly the anterior malleolus as well as diffuse edema and skin ulcerations. -Glucose 106, will check A1c -Hgb 11.0, stable -After our very long and frank discussion, the patient is willing to stay overnight for observation. -Will give IV antibiotics (Rocephin and Flagyl as per the algorithm) with the understanding that it is unlikely that this will fix the underlying problem - but it may improve some of the symptoms. -Meanwhile, he will seriously consider whether he is willing to be sent to [facility] tomorrow for evaluation by Dr. Sharol Given and probable amputation. -He does understand that with very similar stasis changes on the left, there is a reasonable chance that he would have similar issues on the right were he to develop foot ulcerations; we discussed good shoe wearing techniques at length. -If he is unwilling to go to [facility] for admission tomorrow, options would include IV antibiotics at home via home health for 4-6 weeks or possibly PO antibiotics.  He is unlikely to consider placement for antibiotic therapy since he  is the caregiver for his wife.  Depression -There is also a component of depression here. -We discussed it briefly and it likely needs ongoing outpatient  consideration. -He was previously on Lexapro and would likely benefit from having it (or another SSRI) restarted.  Afib on Coumadin -INR 2.37 -This is fairly interesting, since there was a notation of "off Coumadin secondary to fall and subdural hematoma" noted on his PMH - requiring further exploration of the chart -In review of the chart, in 01/2009 he did have CT report following a fall that indicates a right occipital bone fracture with an acute subdural hematoma; it was resolved on imaging in 03/2009. -It appears that he developed a DVT and was seen in [place] 1-2 months ago and started on Xarelto; he could not afford it and so did not take it.   -His INR at that time (admission 10/04/16) was >96 for uncertain reasons, possibly Xarelto -He was resumed on Coumadin on 4/18 -So, it appears that the afib is not the cause of his current anticoagulation scenario -For now, given uncertainty about possible reluctant agreement to proceed towards amputation, will hold Coumadin but simply allow INR to drift back to normal without intervention -He will need DVT prophylaxis once subtherapeutic, and he may need treatment-dose Lovenox or Heparin drip in the meantime given his recent reported DVT -Additionally, his CHA2DS2-VASc score is 4, with an estimated 4%/year stroke rate -He is rate controlled (and in fact somewhat bradycardic) at this time    DVT prophylaxis: Coumadin Code Status:  DNR - confirmed with patient/family Family Communication: Sons present throughout evaluation  Disposition Plan:  To be determined Consults called: None yet Admission status: It is my clinical opinion that referral for OBSERVATION is reasonable and necessary in this patient based on the above information provided. The aforementioned taken together are felt to place the patient at high risk for further clinical deterioration. However it is anticipated that the patient may be medically stable for discharge from the hospital  within 24 to 48 hours.  *Total length of time spent on this visit was >100 minutes.  Greater than >50% of this time involved counseling with the patient/family and coordination of care.   Karmen Bongo MD [facility]  If 7PM-7AM, please contact night-coverage [email or web address] Password [facility]  11/16/2016, 9:03 PM

## 2016-11-17 ENCOUNTER — Telehealth: Payer: Self-pay

## 2016-11-17 DIAGNOSIS — M86671 Other chronic osteomyelitis, right ankle and foot: Secondary | ICD-10-CM

## 2016-11-17 DIAGNOSIS — L03115 Cellulitis of right lower limb: Secondary | ICD-10-CM | POA: Diagnosis not present

## 2016-11-17 DIAGNOSIS — I82409 Acute embolism and thrombosis of unspecified deep veins of unspecified lower extremity: Secondary | ICD-10-CM | POA: Diagnosis present

## 2016-11-17 LAB — CBC
HEMATOCRIT: 30.7 % — AB (ref 39.0–52.0)
Hemoglobin: 9.7 g/dL — ABNORMAL LOW (ref 13.0–17.0)
MCH: 28.9 pg (ref 26.0–34.0)
MCHC: 31.6 g/dL (ref 30.0–36.0)
MCV: 91.4 fL (ref 78.0–100.0)
Platelets: 223 10*3/uL (ref 150–400)
RBC: 3.36 MIL/uL — ABNORMAL LOW (ref 4.22–5.81)
RDW: 14.7 % (ref 11.5–15.5)
WBC: 5 10*3/uL (ref 4.0–10.5)

## 2016-11-17 LAB — BASIC METABOLIC PANEL
ANION GAP: 6 (ref 5–15)
BUN: 23 mg/dL — AB (ref 6–20)
CALCIUM: 8.6 mg/dL — AB (ref 8.9–10.3)
CO2: 27 mmol/L (ref 22–32)
Chloride: 104 mmol/L (ref 101–111)
Creatinine, Ser: 0.75 mg/dL (ref 0.61–1.24)
GFR calc Af Amer: >60 mL/min (ref >60)
Glucose, Bld: 95 mg/dL (ref 65–99)
POTASSIUM: 4.4 mmol/L (ref 3.5–5.1)
Sodium: 137 mmol/L (ref 135–145)

## 2016-11-17 LAB — PREALBUMIN: Prealbumin: 16.7 mg/dL — ABNORMAL LOW (ref 18–38)

## 2016-11-17 LAB — C-REACTIVE PROTEIN: CRP: 5.2 mg/dL — AB (ref <1.0)

## 2016-11-17 MED ORDER — ESCITALOPRAM OXALATE 10 MG PO TABS: 10.0000 mg | ORAL_TABLET | Freq: Every day | ORAL | 5 refills | Status: DC

## 2016-11-17 MED ORDER — WARFARIN SODIUM 5 MG PO TABS: 5.0000 mg | ORAL_TABLET | Freq: Every day | ORAL | 2 refills | Status: DC

## 2016-11-17 MED ORDER — WARFARIN SODIUM 5 MG PO TABS: 2.5000 mg | ORAL_TABLET | Freq: Every day | ORAL | 2 refills | Status: DC

## 2016-11-17 MED ORDER — CEFDINIR 300 MG PO CAPS: 300.0000 mg | ORAL_CAPSULE | Freq: Two times a day (BID) | ORAL | 0 refills | Status: AC

## 2016-11-17 MED ORDER — METRONIDAZOLE 500 MG PO TABS: 500.0000 mg | ORAL_TABLET | Freq: Three times a day (TID) | ORAL | 0 refills | Status: AC

## 2016-11-17 NOTE — Telephone Encounter (Signed)
Tried to call no answer   Message from Dr. Brett CanalesSteve- Call this aft, just discharged this morn with abx, make sure pt getsd and takes abx, sched f u visit with me next wk, if worsens in mean time back to e r because what he really needs is amputaion of his foot

## 2016-11-17 NOTE — Care Management Obs Status (Signed)
MEDICARE OBSERVATION STATUS NOTIFICATION   Patient Details  Name: [name] [name] MRN: [id number] Date of Birth: 03/25/1933   Medicare Observation Status Notification Given:  Other (see comment) (Pt discharged <24hrs)    Malcolm Metrohildress, Taaliyah Delpriore Demske, RN 11/17/2016, 12:00 PM

## 2016-11-17 NOTE — Consult Note (Signed)
WOC by to assess patient, patient for DC to home.  Discussed wound care with bedside nurse, appropriate dressings in place. Patient has instructions to follow up with primary care MD within 2 days for re-assessment and with orthopedics.  Discussed POC with bedside nurse.  Re consult if needed, will not follow at this time. Thanks  Marci Polito M.[employer] MSN, RN,CWOCN, CNS [phone])

## 2016-11-17 NOTE — Progress Notes (Signed)
MD and RN discussed patient plan of care with pt. [facility] Then MD discussed PICC line antibiotics vs oral. Pt stated he wanted to go home and take oral antibiotics. Wound cultures to be obtained before pt D/C later in day. Will continue to monitor.

## 2016-11-17 NOTE — Care Management Note (Addendum)
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 Pt is ind with ADL'S. Pt uses a cane, [facility] They are aware of admission. He does not need order to resume services at DC.   Action/Plan: [facility]. Pt communicates no needs. [name] [name], [facility] aware of DC today.   Expected Discharge Date:  11/17/16               Expected Discharge Plan:  Home w Home Health Services  In-House Referral:  NA  Discharge planning Services  CM Consult  Post Acute Care Choice:  Resumption of Svcs/PTA Provider, Home Health Choice offered to:  Patient  HH [facility]  [facility]  Status of Service:  Completed, signed off  Malcolm MetroChildress, Daron Stutz Demske, RN 11/17/2016, 12:05 PM

## 2016-11-17 NOTE — Progress Notes (Signed)
Wound cultures obtained, then wounds cleansed and re-dressed.

## 2016-11-17 NOTE — Discharge Summary (Signed)
[name] [name], is a 83 y.o. male  DOB 02/19/1933  MRN [id number].  Admission date:  11/16/2016  Admitting Physician  Jonah Blue, MD  Discharge Date:  11/17/2016   Primary MD  Merlyn Albert, MD  Recommendations for primary care physician for things to follow:   Referal for amputation, patient would need prolonged antibiotic treatment. Please follow wound cultures obtained on 11/17/2016  Admission Diagnosis  Cellulitis of right foot [L03.115] Other acute osteomyelitis of right foot [facility]) [M86.171]   Discharge Diagnosis  Cellulitis of right foot [L03.115] Other acute osteomyelitis of right foot (HCC) [M86.171]    Principal Problem:   Osteomyelitis (HCC) Active Problems:   Depression, major, single episode, moderate (HCC)   AF (paroxysmal atrial fibrillation) (HCC)   DVT (deep venous thrombosis) (HCC)      Past Medical History:  Diagnosis Date  . Alcohol abuse    stop drinking since 01/2009  . Arthritis   . Asthma   . Atrial fibrillation (HCC)    off coumadin secondary to fall and subdural  hematoma   . B12 deficiency 9/10   started injection   . BPH (benign prostatic hyperplasia)   . Carcinoma in situ of bladder 2014   Dr. Annabell Howells  . Chronic kidney disease    RECENT UTI FEW MONTHS AGO TX WITH ANTIBIOTIC  . Compression fracture   . COPD (chronic obstructive pulmonary disease) (HCC)   . Diverticula of colon    few small scattered in the sigmoid colon  . Enlarged prostate   . External hemorrhoids 09/2014  . GERD (gastroesophageal reflux disease)   . H/O hiatal hernia   . Headache(784.0)   . Hypertension   . Nutcracker esophagus 2002   on EM  . Osteoporosis   . Peripheral edema   . Reflux   . Venous stasis    chronic     Past Surgical History:  Procedure Laterality Date  . BLADDER ASPIRATION     PROC FOR CANCER CELLS   . CATARACT EXTRACTION W/PHACO  07/30/2012   Procedure:  CATARACT EXTRACTION PHACO AND INTRAOCULAR LENS PLACEMENT (IOC);  Surgeon: Susa Simmonds, MD;  Location: [facility];  Service: Ophthalmology;  Laterality: Left;  CDE:31.66  . CATARACT EXTRACTION W/PHACO Right 11/12/2012   Procedure: CATARACT EXTRACTION PHACO AND INTRAOCULAR LENS PLACEMENT (IOC);  Surgeon: Susa Simmonds, MD;  Location: [facility];  Service: Ophthalmology;  Laterality: Right;  CDE 18.25  . CYSTOSCOPY  10/18/2011   Procedure: CYSTOSCOPY;  Surgeon: Anner Crete, MD;  Location: [facility];  Service: Urology;  Laterality: N/A;  . ESOPHAGOGASTRODUODENOSCOPY  02/11/2010   RMR: GE narrowing s/p dilation 56 & 58 Fr maloney otherwise normal  . ESOPHAGOGASTRODUODENOSCOPY N/A 10/01/2015   Dr.Rourk- mildly severe esophagitis, likely pill-induced, narrowed GE junction without fixed lesion found ?query achalasia, s/p maloney dilation, nomal stomach, normal second portion of the duodenum  . HERNIA REPAIR  12 YRS AGO   RIGHT SIDE   . JOINT REPLACEMENT     RT HIP X2  (  1 REPLACEMENT)  . LUNG BIOPSY  10 YRS AGO   BENIGN  . MALONEY DILATION N/A 10/01/2015   Procedure: [name] DILATION;  Surgeon: Corbin Ade, MD;  Location: [facility];  Service: Endoscopy;  Laterality: N/A;  . PERIPHERAL VASCULAR CATHETERIZATION N/A 10/23/2015   Procedure: Abdominal Aortogram w/Lower Extremity;  Surgeon: Sherren Kerns, MD;  Location: [facility] INVASIVE CV LAB;  Service: Cardiovascular;  Laterality: N/A;  . RHINOPLASTY  1974       HPI  from the history and physical done on the day of admission:    Chief Complaint: foot ulcers  HPI:  [name] [name] is a 83 y.o. male with medical history significant of PVD, venous stasis, HTN, COPD (not on home O2), CKD, afib - and appears to be on Coumadin despite h/o fall with subdural hematoma.  He reports that he is here today "'cause this foot has gone from bad, got worse the last 2 days."  He has a h/o right foot osteomyelitis and has been told he needs amputation but has refused to date.   On the other leg, blisters have been developing and "when they burst they drain real bad".  Right foot has been having problems for 1-2 years.  "Very possible I'm gonna lost that foot."  "I'd just soon be dead as cut that foot off."  "I won't agree yet to have that leg cut off."    "It was doing good until those blisters start coming out."  Was hoping he could be treated with antibiotics, preferably oral.   ED Course:  Foot xray with osteo.  Suggest treatment of cellulitis.  Given IV Vanc.  EKG with afib, on Coumadin.  Review of Systems: As per HPI; otherwise review of systems reviewed and negative.   Ambulatory Status:  Ambulates with a cane     Hospital Course:    1)Lt Foot Ulcers and Osteomyelitis -  known h/o osteomyelitisIn the setting of severe peripheral artery disease who has been resistant to agree to recommended treatment (BKA). Pt seen with his Brother and RN [place] at bedside. he declined transfer to [facility] (Dr Lajoyce Corners) for possible surgical intervention, patient declines placement of PICC line for IV antibiotics. Patient is adamant that he wants to be discharged home today with oral antibiotics only. Patient refuses to stay another day. Please see very dictated H&P dated 11/16/2016 from Dr. Ophelia Charter. Wound cultures ordered. Rx for Oral Flagyl and Omnicef for 2 weeks given, PCP to change or extend antibiotic duration based on culture results when they become available. Patient will follow-up with PCP as outpatient prior to finishing his 2 weeks of oral antibiotics that are prescribed at this time, he will most likely need a much longer duration of antibiotics   . 2)Depression- continue Lexapro, patient denies suicidal or homicidal ideation plan, he tells me he gets comfort and Solace from reading his Bible  3)Afib/DVT- patient has history of recent DVT, CHA2DS2-VASc score is 4, INR 2.37,  repeat INR with PCP. Small to the patient had prior history of Rt occipital bone fracture  with acute subdural hematoma in 2010 after falling   NB!!! 1)Repeat INR PCP on Tuesday, 11/22/2016 Please return for evaluation by surgery/orthopedics for possible below-knee amputation of your Right leg if you change your mind Also please return for placement of PICC line for IV antibiotics if you change of mind Take oral antibiotics as prescribed but this is unlikely to be effective (amputation will be the most effective  treatment ; IV antibiotics may help some)  Discharge Condition: stable  Follow UP  Follow-up Information    Merlyn Albert, MD. Schedule an appointment as soon as possible for a visit in 2 day(s).   Specialty:  Family Medicine Contact information: [address] [place] [address] [phone]            Consults obtained - pt declined infectious disease and surgical consult  Diet and Activity recommendation:  As advised  Discharge Instructions     Discharge Instructions    Call MD for:  difficulty breathing, headache or visual disturbances    Complete by:  As directed    Call MD for:  persistant dizziness or light-headedness    Complete by:  As directed    Call MD for:  persistant nausea and vomiting    Complete by:  As directed    Call MD for:  redness, tenderness, or signs of infection (pain, swelling, redness, odor or green/yellow discharge around incision site)    Complete by:  As directed    Call MD for:  severe uncontrolled pain    Complete by:  As directed    Call MD for:  temperature >100.4    Complete by:  As directed    Diet - low sodium heart healthy    Complete by:  As directed    Discharge instructions    Complete by:  As directed    1)Repeat INR PCP on Tuesday, 11/22/2016 Please return for evaluation by surgery/orthopedics for possible below-knee amputation of your Right leg if you change your mind Also please return for placement of PICC line for IV antibiotics if you change of mind Take oral antibiotics as  prescribed but this is unlikely to be effective (amputation will be the most effective treatment ; IV antibiotics may help some)   Increase activity slowly    Complete by:  As directed         Discharge Medications     Allergies as of 11/17/2016      Reactions   Sulfonamide Derivatives Hives   Tramadol Itching      Medication List    TAKE these medications   CALCIUM PO Take 1 tablet by mouth daily.   cefdinir 300 MG capsule Commonly known as:  OMNICEF Take 1 capsule (300 mg total) by mouth 2 (two) times daily.   DHA COMPLETE PO Take 1 capsule by mouth daily.   escitalopram 10 MG tablet Commonly known as:  LEXAPRO Take 1 tablet (10 mg total) by mouth daily.   furosemide 40 MG tablet Commonly known as:  LASIX Take 2 tablets every morning What changed:  how much to take  how to take this  when to take this  additional instructions   magnesium hydroxide 400 MG/5ML suspension Commonly known as:  MILK OF MAGNESIA Take 15 mLs by mouth daily as needed for mild constipation.   metroNIDAZOLE 500 MG tablet Commonly known as:  FLAGYL Take 1 tablet (500 mg total) by mouth 3 (three) times daily.   oxyCODONE-acetaminophen 7.5-325 MG tablet Commonly known as:  PERCOCET Take 1 tablet by mouth 3 (three) times daily as needed for severe pain.   polyethylene glycol packet Commonly known as:  MIRALAX / GLYCOLAX Take 17 g by mouth 2 (two) times daily.   potassium chloride 10 MEQ tablet Commonly known as:  K-DUR,KLOR-CON Take 1 tablet (10 mEq total) by mouth daily.   PSYLLIUM PO Take 1 packet by mouth  daily.   senna 8.6 MG Tabs tablet Commonly known as:  SENOKOT Take 1 tablet (8.6 mg total) by mouth at bedtime.   verapamil 120 MG CR tablet Commonly known as:  CALAN-SR Take 1 tablet (120 mg total) by mouth at bedtime.   warfarin 5 MG tablet Commonly known as:  COUMADIN Take 1 tablet (5 mg total) by mouth daily at 6 PM. What changed:  when to take this        Major procedures and Radiology Reports - PLEASE review detailed and final reports for all details, in brief -   Dg Foot Complete Right  Result Date: 11/16/2016 CLINICAL DATA:  Patient reports source on his right foot that have gotten progressively worse. Cellulitis. EXAM: RIGHT FOOT COMPLETE - 3+ VIEW COMPARISON:  03/25/2016 FINDINGS: There is diffuse soft tissue swelling. The bones are diffusely osteopenic. There is soft tissue ulceration along the dorsum of the forefoot. There is evidence of periarticular bony destruction involving the third a PIP joint compatible with osteomyelitis. There may also the focal demineralization involving the anterior malleolus of the distal tibia. IMPRESSION: Osseous destruction about the third PIP joint compatible with osteomyelitis. Additionally there may be focal osseous demineralization involving the anterior malleolus of the distal tibia. Consider further evaluation with MRI of the foot with contrast material. Diffuse soft tissue edema and dorsal fall tissue ulceration. Electronically Signed   By: Signa Kell M.D.   On: 11/16/2016 16:33    Micro Results   Recent Results (from the past 240 hour(s))  Blood Cultures x 2 sites     Status: None (Preliminary result)   Collection Time: 11/16/16 10:16 PM  Result Value Ref Range Status   Specimen Description LEFT ANTECUBITAL  Final   Special Requests   Final    BOTTLES DRAWN AEROBIC AND ANAEROBIC Blood Culture results may not be optimal due to an inadequate volume of blood received in culture bottles   Culture NO GROWTH < 12 HOURS  Final   Report Status PENDING  Incomplete  Blood Cultures x 2 sites     Status: None (Preliminary result)   Collection Time: 11/16/16 10:16 PM  Result Value Ref Range Status   Specimen Description BLOOD LEFT HAND  Final   Special Requests   Final    BOTTLES DRAWN AEROBIC AND ANAEROBIC Blood Culture results may not be optimal due to an inadequate volume of blood received in  culture bottles   Culture NO GROWTH < 12 HOURS  Final   Report Status PENDING  Incomplete       Today   Subjective    [name] [name] today has no new Complaints , he just wants to go home. Pt seen with his Brother and RN [place] at bedside. he declined transfer to [facility] (Dr Lajoyce Corners) for possible surgical intervention, patient declines placement of PICC line for IV antibiotics. Patient is adamant that he wants to be discharged home today with oral antibiotics only. Patient refuses to stay another day. Please see very dictated H&P dated 11/16/2016 from Dr. Ophelia Charter.          Patient has been seen and examined prior to discharge   Objective   Blood pressure (!) 100/58, pulse 94, temperature 98.2 F (36.8 C), temperature source Oral, resp. rate 20, height 5\' 11"  (1.803 m), weight 83 kg (183 lb), SpO2 95 %.   Intake/Output Summary (Last 24 hours) at 11/17/16 1120 Last data filed at 11/17/16 0900  Gross per 24 hour  Intake              240 ml  Output              150 ml  Net               90 ml    Exam Gen:- Awake  In no apparent distress  HEENT:- [email or web address].AT,   Neck-Supple Neck,No JVD,  Lungs- mostly clear  CV- S1, S2 normal Abd-  +ve B.Sounds, Abd Soft, No tenderness,    Extremity/Skin:- Multiple ulcers with drainage on Rt foot, 1 large ulcer over the dorsal surface, deep ulcer over the plantar surface in the region of the second metatarsal bone, third toe ulcer as well as an ulcer over his Rt big toe  Data Review   CBC w Diff: Lab Results  Component Value Date   WBC 5.0 11/17/2016   HGB 9.7 (L) 11/17/2016   HCT 30.7 (L) 11/17/2016   HCT 32.2 (L) 10/24/2016   PLT 223 11/17/2016   PLT 246 10/24/2016   LYMPHOPCT 18 11/16/2016   MONOPCT 4 11/16/2016   EOSPCT 10 11/16/2016   BASOPCT 1 11/16/2016    CMP: Lab Results  Component Value Date   NA 137 11/17/2016   NA 141 10/24/2016   K 4.4 11/17/2016   CL 104 11/17/2016   CO2 27 11/17/2016   BUN 23 (H) 11/17/2016   BUN 28 (H)  10/24/2016   CREATININE 0.75 11/17/2016   CREATININE 1.02 02/21/2014   PROT 7.4 11/16/2016   PROT 6.1 10/24/2016   ALBUMIN 3.9 11/16/2016   ALBUMIN 3.7 10/24/2016   BILITOT 0.5 11/16/2016   BILITOT 0.3 10/24/2016   ALKPHOS 93 11/16/2016   AST 14 (L) 11/16/2016   ALT 9 (L) 11/16/2016  .   Total Discharge time is about 33 minutes  [name] [name] M.D on 11/17/2016 at 11:20 AM  [facility]   Office  [phone]  Voice Recognition Reubin Milan/Dragon dictation system was used to create this note, attempts have been made to correct errors. Please contact the author with questions and/or clarifications.

## 2016-11-17 NOTE — Discharge Instructions (Signed)
Please return for evaluation by surgery/orthopedics for possible below-knee amputation of your Right leg if you change your mind Also please return for placement of PICC line for IV antibiotics if you change of mind Take oral antibiotics as prescribed but this is unlikely to be effective (amputation will be the most effective treatment ; IV antibiotics may help some)

## 2016-11-17 NOTE — Progress Notes (Signed)
IV removed, WNL. D/C instructions explained to pt. Verbalized understanding. Pt awaiting ride to transport back home.

## 2016-11-17 NOTE — Telephone Encounter (Signed)
Patient states that he is taking his antibiotic as prescribed. Advised to return call to ER if symptoms worsen. Transferred to front desk to schedule appointment.

## 2016-11-18 LAB — HIV ANTIBODY (ROUTINE TESTING W REFLEX): HIV SCREEN 4TH GENERATION: NONREACTIVE

## 2016-11-18 LAB — HEMOGLOBIN A1C
Hgb A1c MFr Bld: 5.5 % (ref 4.8–5.6)
MEAN PLASMA GLUCOSE: 111 mg/dL

## 2016-11-18 LAB — AEROBIC/ANAEROBIC CULTURE (SURGICAL/DEEP WOUND)
CULTURE: UNDETERMINED
SPECIAL REQUESTS: NORMAL

## 2016-11-18 LAB — AEROBIC/ANAEROBIC CULTURE W GRAM STAIN (SURGICAL/DEEP WOUND)

## 2016-11-20 LAB — AEROBIC CULTURE  (SUPERFICIAL SPECIMEN): SPECIAL REQUESTS: NORMAL

## 2016-11-20 LAB — AEROBIC CULTURE W GRAM STAIN (SUPERFICIAL SPECIMEN)

## 2016-11-21 DIAGNOSIS — I82402 Acute embolism and thrombosis of unspecified deep veins of left lower extremity: Secondary | ICD-10-CM | POA: Diagnosis not present

## 2016-11-21 DIAGNOSIS — I130 Hypertensive heart and chronic kidney disease with heart failure and stage 1 through stage 4 chronic kidney disease, or unspecified chronic kidney disease: Secondary | ICD-10-CM | POA: Diagnosis not present

## 2016-11-21 DIAGNOSIS — L97512 Non-pressure chronic ulcer of other part of right foot with fat layer exposed: Secondary | ICD-10-CM | POA: Diagnosis not present

## 2016-11-21 DIAGNOSIS — N189 Chronic kidney disease, unspecified: Secondary | ICD-10-CM | POA: Diagnosis not present

## 2016-11-21 DIAGNOSIS — I872 Venous insufficiency (chronic) (peripheral): Secondary | ICD-10-CM | POA: Diagnosis not present

## 2016-11-21 DIAGNOSIS — I5031 Acute diastolic (congestive) heart failure: Secondary | ICD-10-CM | POA: Diagnosis not present

## 2016-11-21 LAB — CULTURE, BLOOD (ROUTINE X 2)
Culture: NO GROWTH
Culture: NO GROWTH

## 2016-11-22 ENCOUNTER — Encounter: Payer: Self-pay | Admitting: Family Medicine

## 2016-11-22 ENCOUNTER — Ambulatory Visit (INDEPENDENT_AMBULATORY_CARE_PROVIDER_SITE_OTHER): Payer: Medicare Other | Admitting: Family Medicine

## 2016-11-22 VITALS — Ht 71.0 in | Wt 191.0 lb

## 2016-11-22 DIAGNOSIS — L97504 Non-pressure chronic ulcer of other part of unspecified foot with necrosis of bone: Secondary | ICD-10-CM

## 2016-11-22 NOTE — Progress Notes (Signed)
   Subjective:    Patient ID: [name] [name], male    DOB: 04/17/1933, 83 y.o.   MRN: [id number] Patient arrives office for transitional care visit. Has history of diabetic foot ulcer. Presented to the hospital last week with acute concerns HPI  Patient arrives for a hospital follow up on foot ulcers. Patient left hospital AMA.  Complete hospital records reviewed in the presence of the patient.  Patient did have an x-ray the foot which confirmed ongoing and worsening osteomyelitis.  [facility]. Patient declined a PICC line for IV antibiotic care. Next  Patient insisted on discharge a day after hospitalization.  Patient claims compliance with antibiotics as prescribed.  Patient continues receive weekly visits from the home health folks. Along with dressing changes.  Review of Systems No headache, no major weight loss or weight gain, no chest pain no back pain abdominal pain no change in bowel habits complete ROS otherwise negative     Objective:   Physical Exam .Alert active no acute distress lungs basilar crackles no wheezes no tachypnea heart rhythm irregular but controlled abdomen soft ankles trace edema bilateral right foot deep ulceration dorsal foot chronic in nature also new ulceration distal toe       Assessment & Plan:  Impression diabetic foot ulcer with osteomyelitis with poor vascular flow with prior assessment by vascular surgeons showing no build see to revascularize with ongoing recommendations from specialist proceed with amputation with patient refusing to do this. Long discussion held with the patient. Warning signs discussed carefully. Continue antibiotics. He has agreed to go see the wound care specialist one more time and heated Dr. Tanda RockersNichols questions answered. Follow-up in one month. Patient educated at great length about the nature of his potentially life threatening condition

## 2016-11-29 ENCOUNTER — Telehealth: Payer: Self-pay | Admitting: Family Medicine

## 2016-11-29 DIAGNOSIS — N189 Chronic kidney disease, unspecified: Secondary | ICD-10-CM | POA: Diagnosis not present

## 2016-11-29 DIAGNOSIS — I130 Hypertensive heart and chronic kidney disease with heart failure and stage 1 through stage 4 chronic kidney disease, or unspecified chronic kidney disease: Secondary | ICD-10-CM | POA: Diagnosis not present

## 2016-11-29 DIAGNOSIS — Z7901 Long term (current) use of anticoagulants: Secondary | ICD-10-CM | POA: Diagnosis not present

## 2016-11-29 DIAGNOSIS — I872 Venous insufficiency (chronic) (peripheral): Secondary | ICD-10-CM | POA: Diagnosis not present

## 2016-11-29 DIAGNOSIS — L97512 Non-pressure chronic ulcer of other part of right foot with fat layer exposed: Secondary | ICD-10-CM | POA: Diagnosis not present

## 2016-11-29 DIAGNOSIS — I82402 Acute embolism and thrombosis of unspecified deep veins of left lower extremity: Secondary | ICD-10-CM | POA: Diagnosis not present

## 2016-11-29 DIAGNOSIS — I5031 Acute diastolic (congestive) heart failure: Secondary | ICD-10-CM | POA: Diagnosis not present

## 2016-11-29 NOTE — Telephone Encounter (Signed)
ok 

## 2016-11-29 NOTE — Telephone Encounter (Signed)
If acxtively bleeding to ER. If not bleeding 2.5 mg vitamin K one time dose, plus hold coumadin, plus re coumadin on Friday. Compliance a huge issue with this pt and wish h h would stick with him , ne needs their presence even tho he is non compliant

## 2016-11-29 NOTE — Telephone Encounter (Signed)
Discussed with home health nurse Byrd Hesselbachmaria who was advised If actively bleeding to ER. If not bleeding 2.5 mg vitamin K one time dose, plus hold coumadin, plus re check INR on Friday. Compliance a huge issue with this pt and wish h h would stick with him , ne needs their presence even though he is non compliant. Byrd HesselbachMaria verbalized understanding. Prescription sent electronically to pharmacy. Patient was also sent to the lab for venous STAT INR per home health protocol .

## 2016-11-29 NOTE — Telephone Encounter (Signed)
Pharmacy called back to say Vit K is not available in pill for or injectable. Consult with Dr Brett CanalesSteve who stated to hold the coumadin for now and recheck INR on Friday. ER if any active bleeding Discussed with home health nurse Byrd HesselbachMaria who verbalized understanding.

## 2016-11-29 NOTE — Telephone Encounter (Signed)
[facility] stating that patient fingerstick INR is 8.0. She stated that patient discontinued his antibiotics on his own. She also informed me that patient has not followed up with Dr.Nichols as his discharge summary from the hospital stated. Patient will be discharged from [facility] next week for Non compliance and chronic non healing wound. Home Health nurse would like a order to be sent to [facility] for patient to have INR drawn there. May we do orders for INR.   Call maria at [phone]

## 2016-11-30 ENCOUNTER — Telehealth: Payer: Self-pay | Admitting: Family Medicine

## 2016-11-30 LAB — PROTIME-INR
INR: 7.1 — AB (ref 0.8–1.2)
PROTHROMBIN TIME: 66 s — AB (ref 9.1–12.0)

## 2016-11-30 NOTE — Telephone Encounter (Signed)
sure

## 2016-11-30 NOTE — Telephone Encounter (Signed)
Requesting order for medical social worker.

## 2016-11-30 NOTE — Telephone Encounter (Signed)
Order for Child psychotherapistsocial worker given to home health nurse [name] [name].

## 2016-12-02 ENCOUNTER — Telehealth: Payer: Self-pay | Admitting: *Deleted

## 2016-12-02 DIAGNOSIS — I82402 Acute embolism and thrombosis of unspecified deep veins of left lower extremity: Secondary | ICD-10-CM | POA: Diagnosis not present

## 2016-12-02 DIAGNOSIS — I130 Hypertensive heart and chronic kidney disease with heart failure and stage 1 through stage 4 chronic kidney disease, or unspecified chronic kidney disease: Secondary | ICD-10-CM | POA: Diagnosis not present

## 2016-12-02 DIAGNOSIS — N189 Chronic kidney disease, unspecified: Secondary | ICD-10-CM | POA: Diagnosis not present

## 2016-12-02 DIAGNOSIS — I872 Venous insufficiency (chronic) (peripheral): Secondary | ICD-10-CM | POA: Diagnosis not present

## 2016-12-02 DIAGNOSIS — I5031 Acute diastolic (congestive) heart failure: Secondary | ICD-10-CM | POA: Diagnosis not present

## 2016-12-02 DIAGNOSIS — L97512 Non-pressure chronic ulcer of other part of right foot with fat layer exposed: Secondary | ICD-10-CM | POA: Diagnosis not present

## 2016-12-02 NOTE — Telephone Encounter (Signed)
ok 

## 2016-12-02 NOTE — Telephone Encounter (Signed)
Marisue IvanLiz home health nurse with [facility] calling to report INR today 6.0. Coumadin was stopped due to last INR being 8.0. Pt is not having any bleeding. Consult with dr Brett Canalessteve. Continue to hold coumadin and recheck INR on Monday per dr Brett Canalessteve.  Discussed with Marisue IvanLiz and Marisue IvanLiz verbalized understanding. Also let pt know to go to ED if any evidence of bleeding.

## 2016-12-05 ENCOUNTER — Telehealth: Payer: Self-pay | Admitting: *Deleted

## 2016-12-05 ENCOUNTER — Other Ambulatory Visit: Payer: Self-pay | Admitting: *Deleted

## 2016-12-05 ENCOUNTER — Encounter: Payer: Self-pay | Admitting: *Deleted

## 2016-12-05 DIAGNOSIS — I5031 Acute diastolic (congestive) heart failure: Secondary | ICD-10-CM | POA: Diagnosis not present

## 2016-12-05 DIAGNOSIS — L97512 Non-pressure chronic ulcer of other part of right foot with fat layer exposed: Secondary | ICD-10-CM | POA: Diagnosis not present

## 2016-12-05 DIAGNOSIS — I82402 Acute embolism and thrombosis of unspecified deep veins of left lower extremity: Secondary | ICD-10-CM | POA: Diagnosis not present

## 2016-12-05 DIAGNOSIS — I872 Venous insufficiency (chronic) (peripheral): Secondary | ICD-10-CM | POA: Diagnosis not present

## 2016-12-05 DIAGNOSIS — N189 Chronic kidney disease, unspecified: Secondary | ICD-10-CM | POA: Diagnosis not present

## 2016-12-05 DIAGNOSIS — I130 Hypertensive heart and chronic kidney disease with heart failure and stage 1 through stage 4 chronic kidney disease, or unspecified chronic kidney disease: Secondary | ICD-10-CM | POA: Diagnosis not present

## 2016-12-05 NOTE — Telephone Encounter (Signed)
ok 

## 2016-12-05 NOTE — Patient Outreach (Signed)
[facility]) Care Management  12/05/2016   [name] CaiFred M [name] 04/03/1933 [id number]  RN Health Coach telephone call to patient.  Hipaa compliance verified. Per patient he is doing pretty good today. He is having a little sinus problems but not pad. Patient stated he is not having any shortness of breath today. Patient is not on any  inhalers or oxygen. Patient stated the cellulitus is looking better. Patient had ED visit for the cellulitis and osteomyelitis . Patient has received information but has not read yet.  Patient has agreed to follow up out reach calls.   Current Medications:  Current Outpatient Prescriptions  Medication Sig Dispense Refill  . CALCIUM PO Take 1 tablet by mouth daily.    . Docosahexaenoic Acid (DHA COMPLETE PO) Take 1 capsule by mouth daily.    Marland Kitchen. escitalopram (LEXAPRO) 10 MG tablet Take 1 tablet (10 mg total) by mouth daily. 30 tablet 5  . furosemide (LASIX) 40 MG tablet Take 2 tablets every morning (Patient taking differently: Take 80 mg by mouth daily. ) 30 tablet 5  . magnesium hydroxide (MILK OF MAGNESIA) 400 MG/5ML suspension Take 15 mLs by mouth daily as needed for mild constipation.    Marland Kitchen. oxyCODONE-acetaminophen (PERCOCET) 7.5-325 MG tablet Take 1 tablet by mouth 3 (three) times daily as needed for severe pain. 90 tablet 0  . polyethylene glycol (MIRALAX / GLYCOLAX) packet Take 17 g by mouth 2 (two) times daily. (Patient not taking: Reported on 11/16/2016)    . potassium chloride SA (K-DUR,KLOR-CON) 10 MEQ tablet Take 1 tablet (10 mEq total) by mouth daily. 30 tablet 5  . PSYLLIUM PO Take 1 packet by mouth daily.    Marland Kitchen. senna (SENOKOT) 8.6 MG TABS tablet Take 1 tablet (8.6 mg total) by mouth at bedtime.    . verapamil (CALAN-SR) 120 MG CR tablet Take 1 tablet (120 mg total) by mouth at bedtime. 30 tablet 5  . warfarin (COUMADIN) 5 MG tablet Take 1 tablet (5 mg total) by mouth daily at 6 PM. 30 tablet 2   No current facility-administered medications for  this visit.     Functional Status:  In your present state of health, do you have any difficulty performing the following activities: 12/05/2016 11/16/2016  Hearing? N N  Vision? N N  Difficulty concentrating or making decisions? N N  Walking or climbing stairs? Y Y  Dressing or bathing? N N  Doing errands, shopping? N N  Preparing Food and eating ? N -  Using the Toilet? N -  In the past six months, have you accidently leaked urine? Y -  Do you have problems with loss of bowel control? N -  Managing your Medications? N -  Managing your Finances? N -  Housekeeping or managing your Housekeeping? Y -  Some recent data might be hidden    Fall/Depression Screening: Fall Risk  12/05/2016 11/01/2016 02/11/2014  Falls in the past year? No No No  Risk for fall due to : Impaired balance/gait;Impaired mobility Impaired balance/gait;Impaired mobility -   PHQ 2/9 Scores 12/05/2016 11/01/2016  PHQ - 2 Score 0 0   [facility] CM Care Plan Problem One     Most Recent Value  Care Plan Problem One  Knowledge Deficit in Self Management of COPD  Role Documenting the Problem One  Health Coach  Care Plan for Problem One  Active  [facility] Long Term Goal   Patient will not have any admission for COPD exacerbation within the next  90 days  [facility] Long Term Goal Start Date  12/05/16  Interventions for Problem One Long Term Goal  RN sent patient educational material on COPD. RN discussed acttion plan patient does with COPD symptoms  [facility] CM Short Term Goal #1   Patient will be able to describe purselip breathing withn the next 30 days  [facility] CM Short Term Goal #1 Start Date  12/05/16  Interventions for Short Term Goal #1  RN sent educational picture sheet on purselip breathing. RN will follow up with discussion  [facility] CM Short Term Goal #2   Patient will understand the symptoms of COPD exacerbation, pnuemonia and cold and flu within the next 30 days   [facility] CM Short Term Goal #2 Start Date  12/05/16  Interventions for Short Term Goal  #2  RN sent patient educational material on COPD exacerbation, Community acquired pneumonia, cold and flu symptoms. RN will follow up with patient on discussion      Assessment:  Patient is in green zone Patient does not use inhalers Patient does not use oxugen Patient will benefit from Health Coach telephonic outreach for education and support for COPD self management.   Plan:  RN discussed breathing RN sent patient educational material on osteomyelitis RN will follow up with outreach in July  Gean Maidens BSN RN [facility] Care Management [phone]

## 2016-12-05 NOTE — Telephone Encounter (Signed)
Home health nurse calling to report INR today. Results 1.7. Consult with dr Brett Canalessteve. Take 5mg  daily and recheck INR on June 15th. Home health nurse verbalized understanding.

## 2016-12-07 ENCOUNTER — Telehealth: Payer: Self-pay | Admitting: Family Medicine

## 2016-12-07 DIAGNOSIS — N189 Chronic kidney disease, unspecified: Secondary | ICD-10-CM | POA: Diagnosis not present

## 2016-12-07 DIAGNOSIS — I82402 Acute embolism and thrombosis of unspecified deep veins of left lower extremity: Secondary | ICD-10-CM | POA: Diagnosis not present

## 2016-12-07 DIAGNOSIS — I872 Venous insufficiency (chronic) (peripheral): Secondary | ICD-10-CM | POA: Diagnosis not present

## 2016-12-07 DIAGNOSIS — I5031 Acute diastolic (congestive) heart failure: Secondary | ICD-10-CM | POA: Diagnosis not present

## 2016-12-07 DIAGNOSIS — I130 Hypertensive heart and chronic kidney disease with heart failure and stage 1 through stage 4 chronic kidney disease, or unspecified chronic kidney disease: Secondary | ICD-10-CM | POA: Diagnosis not present

## 2016-12-07 DIAGNOSIS — L97512 Non-pressure chronic ulcer of other part of right foot with fat layer exposed: Secondary | ICD-10-CM | POA: Diagnosis not present

## 2016-12-07 NOTE — Telephone Encounter (Signed)
good

## 2016-12-07 NOTE — Telephone Encounter (Signed)
Pt will refuse hosp intervention for sure, if bp good, cst

## 2016-12-07 NOTE — Telephone Encounter (Signed)
FYIByrd Hesselbach: [name] [name] with [facility] stated that they will be continuing patient's Home Health care. They had patient sign a behavior form.

## 2016-12-07 NOTE — Telephone Encounter (Signed)
[facility] stating that she checked patient's pulse and it is 42bpm, and his blood pressure is 128/60.   Byrd HesselbachMaria  [phone]

## 2016-12-07 NOTE — Telephone Encounter (Signed)
Left message return call 12/07/16 

## 2016-12-08 DIAGNOSIS — I70261 Atherosclerosis of native arteries of extremities with gangrene, right leg: Secondary | ICD-10-CM | POA: Diagnosis not present

## 2016-12-08 DIAGNOSIS — L97514 Non-pressure chronic ulcer of other part of right foot with necrosis of bone: Secondary | ICD-10-CM | POA: Diagnosis not present

## 2016-12-08 DIAGNOSIS — I739 Peripheral vascular disease, unspecified: Secondary | ICD-10-CM | POA: Diagnosis not present

## 2016-12-09 ENCOUNTER — Encounter: Payer: Self-pay | Admitting: Family Medicine

## 2016-12-09 ENCOUNTER — Ambulatory Visit (INDEPENDENT_AMBULATORY_CARE_PROVIDER_SITE_OTHER): Payer: Medicare Other | Admitting: Family Medicine

## 2016-12-09 ENCOUNTER — Telehealth: Payer: Self-pay | Admitting: *Deleted

## 2016-12-09 VITALS — BP 122/62 | Ht 71.0 in | Wt 189.0 lb

## 2016-12-09 DIAGNOSIS — N189 Chronic kidney disease, unspecified: Secondary | ICD-10-CM | POA: Diagnosis not present

## 2016-12-09 DIAGNOSIS — M19039 Primary osteoarthritis, unspecified wrist: Secondary | ICD-10-CM | POA: Diagnosis not present

## 2016-12-09 DIAGNOSIS — I82402 Acute embolism and thrombosis of unspecified deep veins of left lower extremity: Secondary | ICD-10-CM | POA: Diagnosis not present

## 2016-12-09 DIAGNOSIS — L97512 Non-pressure chronic ulcer of other part of right foot with fat layer exposed: Secondary | ICD-10-CM | POA: Diagnosis not present

## 2016-12-09 DIAGNOSIS — I5031 Acute diastolic (congestive) heart failure: Secondary | ICD-10-CM | POA: Diagnosis not present

## 2016-12-09 DIAGNOSIS — I130 Hypertensive heart and chronic kidney disease with heart failure and stage 1 through stage 4 chronic kidney disease, or unspecified chronic kidney disease: Secondary | ICD-10-CM | POA: Diagnosis not present

## 2016-12-09 DIAGNOSIS — I872 Venous insufficiency (chronic) (peripheral): Secondary | ICD-10-CM | POA: Diagnosis not present

## 2016-12-09 MED ORDER — PREDNISONE 20 MG PO TABS: ORAL_TABLET | ORAL | 0 refills | Status: DC

## 2016-12-09 MED ORDER — CEPHALEXIN 500 MG PO CAPS: ORAL_CAPSULE | ORAL | 0 refills | Status: DC

## 2016-12-09 NOTE — Progress Notes (Signed)
   Subjective:    Patient ID: [name] [name], male    DOB: 03/19/1933, 83 y.o.   MRN: [id number]  HPI Patient in today for right hand swelling. Patient has c/o right hand pain, and wrist pain.  There is redness around the wrist and the hand does not appear severe but it is warm to touch and red he denies any injury sprays fevers chills patient very complex with arthritis heart failure and peripheral vascular disease States no other concerns this visit.    Review of Systems Denies high fever chills sweats denies wheezing difficulty breathing    Objective:   Physical Exam Elbow normal forearm normal wrist some redness with decreased range of motion some swelling around the wrist none into the artery and although the proximal aspect of the hand is puffy No sign of any laceration Patient has oxycodone at home for pain caution drowsiness I doubt bacterial component but it is a possibility patient was warned if he starts running fever to immediately go to ER    Assessment & Plan:  Significant arthritis in the right wrist prednisone over the next several days Keflex to cover for possibility of infection but I think that is less likely No Coumadin for Friday Saturday, may take one on Sunday, Dr. Brett [name] will see patient on Monday will decide regarding Coumadin dosing for the coming week as well as when to recheck his INR

## 2016-12-09 NOTE — Telephone Encounter (Signed)
[name] from [facility] calling to report INR today. Result is 3.5. Takes coumadin 5mg  daily. Consult with dr Lorin Picketscott change to hold coumadin today and tomorrow. Then take one on sundays, tues, Wednesday, Thursday, and Saturday. And take one half tablet on Monday and Friday. Recheck INR on Thursday june21stByrd Hesselbach. [name] [name] from [facility] verbalized understanding. Also pt right hand swelling today. Pt needs office visit today per dr Lorin Picketscott. Pt transferred to front to schedule office visit.

## 2016-12-12 ENCOUNTER — Ambulatory Visit (INDEPENDENT_AMBULATORY_CARE_PROVIDER_SITE_OTHER): Payer: Medicare Other | Admitting: Family Medicine

## 2016-12-12 ENCOUNTER — Encounter: Payer: Self-pay | Admitting: Family Medicine

## 2016-12-12 VITALS — BP 130/72 | Ht 71.0 in | Wt 191.1 lb

## 2016-12-12 DIAGNOSIS — I82402 Acute embolism and thrombosis of unspecified deep veins of left lower extremity: Secondary | ICD-10-CM | POA: Diagnosis not present

## 2016-12-12 DIAGNOSIS — M19039 Primary osteoarthritis, unspecified wrist: Secondary | ICD-10-CM

## 2016-12-12 DIAGNOSIS — M1990 Unspecified osteoarthritis, unspecified site: Secondary | ICD-10-CM | POA: Diagnosis not present

## 2016-12-12 DIAGNOSIS — I130 Hypertensive heart and chronic kidney disease with heart failure and stage 1 through stage 4 chronic kidney disease, or unspecified chronic kidney disease: Secondary | ICD-10-CM | POA: Diagnosis not present

## 2016-12-12 DIAGNOSIS — N189 Chronic kidney disease, unspecified: Secondary | ICD-10-CM | POA: Diagnosis not present

## 2016-12-12 DIAGNOSIS — J449 Chronic obstructive pulmonary disease, unspecified: Secondary | ICD-10-CM | POA: Diagnosis not present

## 2016-12-12 DIAGNOSIS — Z7901 Long term (current) use of anticoagulants: Secondary | ICD-10-CM | POA: Diagnosis not present

## 2016-12-12 DIAGNOSIS — I872 Venous insufficiency (chronic) (peripheral): Secondary | ICD-10-CM | POA: Diagnosis not present

## 2016-12-12 DIAGNOSIS — I5031 Acute diastolic (congestive) heart failure: Secondary | ICD-10-CM | POA: Diagnosis not present

## 2016-12-12 DIAGNOSIS — L97511 Non-pressure chronic ulcer of other part of right foot limited to breakdown of skin: Secondary | ICD-10-CM | POA: Diagnosis not present

## 2016-12-12 DIAGNOSIS — L97512 Non-pressure chronic ulcer of other part of right foot with fat layer exposed: Secondary | ICD-10-CM | POA: Diagnosis not present

## 2016-12-12 DIAGNOSIS — L97504 Non-pressure chronic ulcer of other part of unspecified foot with necrosis of bone: Secondary | ICD-10-CM | POA: Diagnosis not present

## 2016-12-12 DIAGNOSIS — M810 Age-related osteoporosis without current pathological fracture: Secondary | ICD-10-CM | POA: Diagnosis not present

## 2016-12-12 DIAGNOSIS — I4891 Unspecified atrial fibrillation: Secondary | ICD-10-CM | POA: Diagnosis not present

## 2016-12-12 DIAGNOSIS — Z8701 Personal history of pneumonia (recurrent): Secondary | ICD-10-CM | POA: Diagnosis not present

## 2016-12-12 DIAGNOSIS — L97821 Non-pressure chronic ulcer of other part of left lower leg limited to breakdown of skin: Secondary | ICD-10-CM | POA: Diagnosis not present

## 2016-12-12 DIAGNOSIS — Z5181 Encounter for therapeutic drug level monitoring: Secondary | ICD-10-CM | POA: Diagnosis not present

## 2016-12-12 NOTE — Patient Instructions (Signed)
Stop keflex right away  Finish prednisone as prescribed

## 2016-12-12 NOTE — Progress Notes (Signed)
   Subjective:    Patient ID: [name] [name], male    DOB: 07/15/1932, 83 y.o.   MRN: [id number] atient arrives ofr follow-up with multiple concerns HPI Patient in today for a follow up of wrist arthritis.may have over used it, did get sore first, thenswelled  Foot off and on discomforrt  Saw dr Tanda Rockersnichols, who called it wet,gangrene. Patient still declining amputation.No fever no chills. Ongoing weeping. Just changeddressing. A   Patient currently on prednisone and keflex. On ingested Coumadin dose. No fever no chills. No known history of gout.   Claims compliance with Coumadin. See prior notes. INR got quite high. Compliance always a question in this patient  Review of Systems No headache, no major weight loss or weight gain, no chest pain no back pain abdominal pain no change in bowel habits complete ROS otherwise negative     Objective:   Physical Exam  Alert and oriented, vitals reviewed and stable, NAD ENT-TM's and ext canals WNL bilat via otoscopic exam Soft palate, tonsils and post pharynx WNL via oropharyngeal exam Neck-symmetric, no masses; thyroid nonpalpable and nontender Pulmonary-no tachypnea or accessory muscle use; Clear without wheezes via auscultation Card--no abnrml murmurs, rhythm reg and rate WNL Carotid pulses symmetric, without bruits Right wrist less inflammation good range of motion no erythema  Right foot not examined today      Assessment & Plan:  mpression 1 monoarticular arthritis. Highly doubt infection. Stop Keflex. MD FINISH PREDNISONE IF RECURS WILL NEED TO INVESTIGATE FOR GOUT DISCUSSED #2 anticoagulation INR discussed. Compliance discussed. We'll recheck later this week. Maintain same pending  Greater than 50% of this 25 minute face to face visit was spent in counseling and discussion and coordination of care regarding the above diagnosis/diagnosies

## 2016-12-15 ENCOUNTER — Other Ambulatory Visit: Payer: Self-pay | Admitting: Family Medicine

## 2016-12-15 DIAGNOSIS — N189 Chronic kidney disease, unspecified: Secondary | ICD-10-CM | POA: Diagnosis not present

## 2016-12-15 DIAGNOSIS — I5031 Acute diastolic (congestive) heart failure: Secondary | ICD-10-CM | POA: Diagnosis not present

## 2016-12-15 DIAGNOSIS — I82402 Acute embolism and thrombosis of unspecified deep veins of left lower extremity: Secondary | ICD-10-CM | POA: Diagnosis not present

## 2016-12-15 DIAGNOSIS — I130 Hypertensive heart and chronic kidney disease with heart failure and stage 1 through stage 4 chronic kidney disease, or unspecified chronic kidney disease: Secondary | ICD-10-CM | POA: Diagnosis not present

## 2016-12-15 DIAGNOSIS — L97512 Non-pressure chronic ulcer of other part of right foot with fat layer exposed: Secondary | ICD-10-CM | POA: Diagnosis not present

## 2016-12-15 DIAGNOSIS — I872 Venous insufficiency (chronic) (peripheral): Secondary | ICD-10-CM | POA: Diagnosis not present

## 2016-12-15 MED ORDER — VERAPAMIL HCL ER 120 MG PO TBCR: 120.0000 mg | EXTENDED_RELEASE_TABLET | Freq: Every day | ORAL | 5 refills | Status: DC

## 2016-12-15 NOTE — Progress Notes (Signed)
Somehow patient was on 180 mg heart rate 44 we reduced at 120 mg.

## 2016-12-16 DIAGNOSIS — I872 Venous insufficiency (chronic) (peripheral): Secondary | ICD-10-CM | POA: Diagnosis not present

## 2016-12-16 DIAGNOSIS — I130 Hypertensive heart and chronic kidney disease with heart failure and stage 1 through stage 4 chronic kidney disease, or unspecified chronic kidney disease: Secondary | ICD-10-CM | POA: Diagnosis not present

## 2016-12-16 DIAGNOSIS — I5031 Acute diastolic (congestive) heart failure: Secondary | ICD-10-CM | POA: Diagnosis not present

## 2016-12-16 DIAGNOSIS — L97512 Non-pressure chronic ulcer of other part of right foot with fat layer exposed: Secondary | ICD-10-CM | POA: Diagnosis not present

## 2016-12-16 DIAGNOSIS — N189 Chronic kidney disease, unspecified: Secondary | ICD-10-CM | POA: Diagnosis not present

## 2016-12-16 DIAGNOSIS — I82402 Acute embolism and thrombosis of unspecified deep veins of left lower extremity: Secondary | ICD-10-CM | POA: Diagnosis not present

## 2016-12-22 ENCOUNTER — Ambulatory Visit: Payer: Medicare Other | Admitting: Family Medicine

## 2016-12-22 ENCOUNTER — Telehealth: Payer: Self-pay | Admitting: Family Medicine

## 2016-12-22 DIAGNOSIS — L97512 Non-pressure chronic ulcer of other part of right foot with fat layer exposed: Secondary | ICD-10-CM | POA: Diagnosis not present

## 2016-12-22 DIAGNOSIS — I5031 Acute diastolic (congestive) heart failure: Secondary | ICD-10-CM | POA: Diagnosis not present

## 2016-12-22 DIAGNOSIS — I872 Venous insufficiency (chronic) (peripheral): Secondary | ICD-10-CM | POA: Diagnosis not present

## 2016-12-22 DIAGNOSIS — I82402 Acute embolism and thrombosis of unspecified deep veins of left lower extremity: Secondary | ICD-10-CM | POA: Diagnosis not present

## 2016-12-22 DIAGNOSIS — N189 Chronic kidney disease, unspecified: Secondary | ICD-10-CM | POA: Diagnosis not present

## 2016-12-22 DIAGNOSIS — I130 Hypertensive heart and chronic kidney disease with heart failure and stage 1 through stage 4 chronic kidney disease, or unspecified chronic kidney disease: Secondary | ICD-10-CM | POA: Diagnosis not present

## 2016-12-22 NOTE — Telephone Encounter (Signed)
Received a phone call from [facility] informing us that patient's INR today is 1.8. Discussed with Dr.Steve Luking and told to advise patient to take 1 (5 MG) tablet by mouth daily. Recheck INR in two weeks. Home Health verbalized understanding.

## 2016-12-28 DIAGNOSIS — I5031 Acute diastolic (congestive) heart failure: Secondary | ICD-10-CM | POA: Diagnosis not present

## 2016-12-28 DIAGNOSIS — L97512 Non-pressure chronic ulcer of other part of right foot with fat layer exposed: Secondary | ICD-10-CM | POA: Diagnosis not present

## 2016-12-28 DIAGNOSIS — I130 Hypertensive heart and chronic kidney disease with heart failure and stage 1 through stage 4 chronic kidney disease, or unspecified chronic kidney disease: Secondary | ICD-10-CM | POA: Diagnosis not present

## 2016-12-28 DIAGNOSIS — N189 Chronic kidney disease, unspecified: Secondary | ICD-10-CM | POA: Diagnosis not present

## 2016-12-28 DIAGNOSIS — I82402 Acute embolism and thrombosis of unspecified deep veins of left lower extremity: Secondary | ICD-10-CM | POA: Diagnosis not present

## 2016-12-28 DIAGNOSIS — I872 Venous insufficiency (chronic) (peripheral): Secondary | ICD-10-CM | POA: Diagnosis not present

## 2016-12-29 ENCOUNTER — Telehealth: Payer: Self-pay | Admitting: Family Medicine

## 2016-12-29 NOTE — Telephone Encounter (Signed)
Pharmacist stated that the patient picked up a 90 day supply of the verapamil 120 mg cr on 10/26/16 and should not need refill till August([facility]) [name] HesselbachMaria at home health notified and stated patient is not taking the verapamil-he don't know what he did with it and is very confused and non compliant with his meds in general.

## 2016-12-29 NOTE — Telephone Encounter (Signed)
[name] to let Dr. Lorin PicketScott know that [name] [name] did not receive the updated dose of Verapamil that our system shows on 12/15/16.  She is wanting this resent over.

## 2017-01-01 NOTE — Telephone Encounter (Signed)
When is my next sched visit with this pt?

## 2017-01-02 NOTE — Telephone Encounter (Signed)
July 18th

## 2017-01-03 ENCOUNTER — Other Ambulatory Visit: Payer: Self-pay | Admitting: Pharmacist

## 2017-01-03 NOTE — Telephone Encounter (Signed)
Have pt bring all meds then, and, ask to bring along on of his children if at all possible

## 2017-01-03 NOTE — Telephone Encounter (Signed)
Spoke with patient and informed him per Dr.Steve Luking- to bring all of his medication with him to his appointment and bring one of his children if at all possible. Patient verbalized understanding and stated that he will more than likely have to come alone or his wife will accompany him.

## 2017-01-03 NOTE — Patient Outreach (Signed)
[facility]) Care Management  01/03/2017  [name] CaiFred M [name] 06/15/1933 [id number]  Follow-up call to patient, HIPAA details verified.    Patient reports his main concern continues to be his leg and his medication cost.    There are very limited options for medication cost assistance for patient as he does not have Medicare Part D and likely needs to wait until open enrollment in fall for plan year 2019.  He was provided with [place] [facility] [employer] [employer] [facility] [place] phone number at last [facility] Pharmac[facility] phone outreach.   He reports he has his medications and reports he takes them as Dr Gerda DissLuking prescribes. When asked about patient's verapamil discrepancy that was noted in chart from last week, patient reports he put verapamil 120 mg in an old bottle and forgot he did that.  He reports he took it to his local pharmacy and they told him he had verapamil 120 mg in the bottle---unable to verify this other than per patient report.   Patient denies pharmacy needs at this time.   Plan:  Patient provided with [facility] phone number and encouraged to contact to discuss his questions regarding Medicare Part D and dental coverage.    Will close pharmacy episode as he denies pharmacy needs from [facility] Pharmac[facility] at this time.    Tommye StandardKevin Ercelle Winkles, PharmD, [facility]BCACP Clinical Pharmacist [facility] [phone]

## 2017-01-04 ENCOUNTER — Ambulatory Visit: Payer: Self-pay | Admitting: *Deleted

## 2017-01-04 ENCOUNTER — Ambulatory Visit: Payer: Medicare Other | Admitting: Family Medicine

## 2017-01-04 DIAGNOSIS — L97828 Non-pressure chronic ulcer of other part of left lower leg with other specified severity: Secondary | ICD-10-CM | POA: Diagnosis not present

## 2017-01-04 DIAGNOSIS — L97515 Non-pressure chronic ulcer of other part of right foot with muscle involvement without evidence of necrosis: Secondary | ICD-10-CM | POA: Diagnosis not present

## 2017-01-04 DIAGNOSIS — L97514 Non-pressure chronic ulcer of other part of right foot with necrosis of bone: Secondary | ICD-10-CM | POA: Diagnosis not present

## 2017-01-04 DIAGNOSIS — I999 Unspecified disorder of circulatory system: Secondary | ICD-10-CM | POA: Diagnosis not present

## 2017-01-05 ENCOUNTER — Telehealth: Payer: Self-pay | Admitting: Family Medicine

## 2017-01-05 DIAGNOSIS — I82402 Acute embolism and thrombosis of unspecified deep veins of left lower extremity: Secondary | ICD-10-CM | POA: Diagnosis not present

## 2017-01-05 DIAGNOSIS — I5031 Acute diastolic (congestive) heart failure: Secondary | ICD-10-CM | POA: Diagnosis not present

## 2017-01-05 DIAGNOSIS — L97512 Non-pressure chronic ulcer of other part of right foot with fat layer exposed: Secondary | ICD-10-CM | POA: Diagnosis not present

## 2017-01-05 DIAGNOSIS — I872 Venous insufficiency (chronic) (peripheral): Secondary | ICD-10-CM | POA: Diagnosis not present

## 2017-01-05 DIAGNOSIS — N189 Chronic kidney disease, unspecified: Secondary | ICD-10-CM | POA: Diagnosis not present

## 2017-01-05 DIAGNOSIS — I130 Hypertensive heart and chronic kidney disease with heart failure and stage 1 through stage 4 chronic kidney disease, or unspecified chronic kidney disease: Secondary | ICD-10-CM | POA: Diagnosis not present

## 2017-01-05 NOTE — Telephone Encounter (Signed)
Pt requesting refill on oxycodone and coumadin.

## 2017-01-05 NOTE — Telephone Encounter (Signed)
Patient requesting refill on oxycodone 7.5/325

## 2017-01-05 NOTE — Telephone Encounter (Signed)
Consult with dr Lorin Picketscott. Change to one and a half tablets on Monday and Friday and one tablet all other days. Recheck in 7-10 days. Pt notified and home health nurse Olegario MessierKathy. They both verbalized understanding.

## 2017-01-05 NOTE — Telephone Encounter (Signed)
Last seen 12/12/16.

## 2017-01-05 NOTE — Telephone Encounter (Signed)
[facility]-(Kathy) letting you know that patient INR-was 1.3 today. He is taking 5 mg of coumadin daily. Patient states might have missed some days.    [phone]

## 2017-01-05 NOTE — Telephone Encounter (Signed)
Seen 10/26/16 and received two scripts. Note states to return in 2 months. Seen again June 18th.

## 2017-01-05 NOTE — Telephone Encounter (Signed)
Refill coumadin times one and refill oxycodone times one

## 2017-01-06 MED ORDER — OXYCODONE-ACETAMINOPHEN 7.5-325 MG PO TABS: 1.0000 | ORAL_TABLET | Freq: Three times a day (TID) | ORAL | 0 refills | Status: DC | PRN

## 2017-01-06 MED ORDER — WARFARIN SODIUM 5 MG PO TABS: 5.0000 mg | ORAL_TABLET | Freq: Every day | ORAL | 0 refills | Status: DC

## 2017-01-06 NOTE — Addendum Note (Signed)
Addended by: Jeralene PetersREWS, Carri Spillers R on: 01/06/2017 08:24 AM   Modules accepted: Orders

## 2017-01-06 NOTE — Telephone Encounter (Signed)
Spoke with patient and informed her per Dr.Steve Luking- we are refilling your oxycodone, and coumadin. You will need to pick up your prescription. Patient verbalized understanding.

## 2017-01-10 ENCOUNTER — Telehealth: Payer: Self-pay | Admitting: Family Medicine

## 2017-01-10 DIAGNOSIS — I5031 Acute diastolic (congestive) heart failure: Secondary | ICD-10-CM | POA: Diagnosis not present

## 2017-01-10 DIAGNOSIS — I872 Venous insufficiency (chronic) (peripheral): Secondary | ICD-10-CM | POA: Diagnosis not present

## 2017-01-10 DIAGNOSIS — N189 Chronic kidney disease, unspecified: Secondary | ICD-10-CM | POA: Diagnosis not present

## 2017-01-10 DIAGNOSIS — I130 Hypertensive heart and chronic kidney disease with heart failure and stage 1 through stage 4 chronic kidney disease, or unspecified chronic kidney disease: Secondary | ICD-10-CM | POA: Diagnosis not present

## 2017-01-10 DIAGNOSIS — I82402 Acute embolism and thrombosis of unspecified deep veins of left lower extremity: Secondary | ICD-10-CM | POA: Diagnosis not present

## 2017-01-10 DIAGNOSIS — L97512 Non-pressure chronic ulcer of other part of right foot with fat layer exposed: Secondary | ICD-10-CM | POA: Diagnosis not present

## 2017-01-10 NOTE — Telephone Encounter (Signed)
[name] with [facility] called stating that patient's INR today is 2.3 and PT was 28. Patient was not taking as prescribed and was taking 1 whole tablet by mouth daily. Discussed with Dr.Scott Luking and was told to advise patient to take 1 tablet by mouth daily of 5MG  coumadin and recheck in two weeks. [name] with [facility] verbalized understanding.

## 2017-01-11 ENCOUNTER — Ambulatory Visit (INDEPENDENT_AMBULATORY_CARE_PROVIDER_SITE_OTHER): Payer: Medicare Other | Admitting: Family Medicine

## 2017-01-11 ENCOUNTER — Encounter: Payer: Self-pay | Admitting: Family Medicine

## 2017-01-11 VITALS — BP 122/56 | Ht 71.0 in | Wt 194.2 lb

## 2017-01-11 DIAGNOSIS — L97504 Non-pressure chronic ulcer of other part of unspecified foot with necrosis of bone: Secondary | ICD-10-CM | POA: Diagnosis not present

## 2017-01-11 DIAGNOSIS — F321 Major depressive disorder, single episode, moderate: Secondary | ICD-10-CM | POA: Diagnosis not present

## 2017-01-11 DIAGNOSIS — I10 Essential (primary) hypertension: Secondary | ICD-10-CM

## 2017-01-11 DIAGNOSIS — I480 Paroxysmal atrial fibrillation: Secondary | ICD-10-CM

## 2017-01-11 DIAGNOSIS — I509 Heart failure, unspecified: Secondary | ICD-10-CM | POA: Diagnosis not present

## 2017-01-11 MED ORDER — OXYCODONE-ACETAMINOPHEN 7.5-325 MG PO TABS: 1.0000 | ORAL_TABLET | Freq: Three times a day (TID) | ORAL | 0 refills | Status: DC | PRN

## 2017-01-11 NOTE — Progress Notes (Signed)
   Subjective:    Patient ID: [name] [name], male    DOB: 05/22/1933, 83 y.o.   MRN: [id number] Patient arrives office with son. Multiple reasons. HPI Patient in today for a 1 month follow up for ulcer to right foot. Continues to receive narcotic pain medicine for this. Claims medicine generally is controlling his symptoms.  Has concerns of fluid draining from bilateral leg. This is been a long-standing issue for this patient. Currently followed by the vascular when specialist [place] regarding his foot.  We had backed off on the verapamil to 120 mg. Nurse was concerned about confusion on the part of the patient. She was concerned that he had both 120 mg in the 180 mg tablets in the same bottle. Patient states he was aware of this was only taking the 120s. His heart rate and into low blood pressure to   Son reports that either he or his wife her check in the patient daily. They're keeping on medicine compliance. In fact help him dress the lower legs Results for orders placed or performed in visit on 11/29/16  Protime-INR  Result Value Ref Range   INR 7.1 (>) 0.8 - 1.2   Prothrombin Time 66.0 (H) 9.1 - 12.0 sec      Review of Systems No headache, no major weight loss or weight gain, no chest pain no back pain abdominal pain no change in bowel habits complete ROS otherwise negative     Objective:   Physical Exam  Alert and oriented, vitals reviewed and stable, NAD ENT-TM's and ext canals WNL bilat via otoscopic exam Soft palate, tonsils and post pharynx WNL via oropharyngeal exam Neck-symmetric, no masses; thyroid nonpalpable and nontender Pulmonary-no tachypnea or accessory muscle use; Clear without wheezes via auscultation Card--no abnrml murmurs, Rhythm irregular but controlled. Rate in the low 50s. WNL Carotid pulses symmetric, without bruits  Ankles 1+ edema bilateral dressings due to weeping venous stasis blisters. Right foot not examined.     Assessment & Plan:  Impression 1  severe right foot deep ulcer with patient already having received recommendation for amputation. Followed by wound specialist #2 bilateral leg edema with venous stasis weeping discussed. Do not want to increase diuretic. #3 depression patient claims stable on medication. #4 rated cardia improved with decrease in verapamil dose itching.

## 2017-01-17 ENCOUNTER — Telehealth: Payer: Self-pay | Admitting: *Deleted

## 2017-01-17 DIAGNOSIS — L97512 Non-pressure chronic ulcer of other part of right foot with fat layer exposed: Secondary | ICD-10-CM | POA: Diagnosis not present

## 2017-01-17 DIAGNOSIS — I82402 Acute embolism and thrombosis of unspecified deep veins of left lower extremity: Secondary | ICD-10-CM | POA: Diagnosis not present

## 2017-01-17 DIAGNOSIS — I872 Venous insufficiency (chronic) (peripheral): Secondary | ICD-10-CM | POA: Diagnosis not present

## 2017-01-17 DIAGNOSIS — N189 Chronic kidney disease, unspecified: Secondary | ICD-10-CM | POA: Diagnosis not present

## 2017-01-17 DIAGNOSIS — I130 Hypertensive heart and chronic kidney disease with heart failure and stage 1 through stage 4 chronic kidney disease, or unspecified chronic kidney disease: Secondary | ICD-10-CM | POA: Diagnosis not present

## 2017-01-17 DIAGNOSIS — I5031 Acute diastolic (congestive) heart failure: Secondary | ICD-10-CM | POA: Diagnosis not present

## 2017-01-17 NOTE — Telephone Encounter (Signed)
The medication is Verapamil. Please advise?

## 2017-01-17 NOTE — Telephone Encounter (Signed)
Spoke with [name] [name] with [facility] and she stated that patient's daughter in-law has tried to make him use the pill box and has also tried to give him the pills and patient picks out of her hand what he wants to take. [name] [name] stated that she was calling just as and FYI for our office because patient is not complaint and she wants to keep [name] [name] informed. She also stated that patient is again on the verge of not being re-certified for Home Health under medicare for not being complaint. This message is just and FYI

## 2017-01-17 NOTE — Telephone Encounter (Signed)
Just met with family and pt on this last wk  Throw away all 180s  Family should either insist on pill box use or visit pt every time he needs to take his med

## 2017-01-17 NOTE — Telephone Encounter (Signed)
[name] HesselbachMaria called from [facility] stating the patient is not taking all of his medications everyday he will pick and choose what he wants to take. Patient's daughter n law Dennie Bibleat made [name] HesselbachMaria aware, patient will not use the pill box. [name] HesselbachMaria stated Patient still have deratamil 120 and 180, patient put the 120s in a 180 bottle for whatever reason. Please advise [name] HesselbachMaria [phone]

## 2017-01-24 ENCOUNTER — Telehealth: Payer: Self-pay | Admitting: *Deleted

## 2017-01-24 DIAGNOSIS — I872 Venous insufficiency (chronic) (peripheral): Secondary | ICD-10-CM | POA: Diagnosis not present

## 2017-01-24 DIAGNOSIS — I5031 Acute diastolic (congestive) heart failure: Secondary | ICD-10-CM | POA: Diagnosis not present

## 2017-01-24 DIAGNOSIS — I130 Hypertensive heart and chronic kidney disease with heart failure and stage 1 through stage 4 chronic kidney disease, or unspecified chronic kidney disease: Secondary | ICD-10-CM | POA: Diagnosis not present

## 2017-01-24 DIAGNOSIS — L97512 Non-pressure chronic ulcer of other part of right foot with fat layer exposed: Secondary | ICD-10-CM | POA: Diagnosis not present

## 2017-01-24 DIAGNOSIS — N189 Chronic kidney disease, unspecified: Secondary | ICD-10-CM | POA: Diagnosis not present

## 2017-01-24 DIAGNOSIS — I82402 Acute embolism and thrombosis of unspecified deep veins of left lower extremity: Secondary | ICD-10-CM | POA: Diagnosis not present

## 2017-01-24 NOTE — Telephone Encounter (Signed)
Marchelle Folksmanda from [facility] calling to report INR 4.7. Takes coumadin 5mg  daily. Consult with dr Lorin Picketscott. Hold coumadin today, tomorrow and Thursday. Recheck INR on Thursday this week. Marchelle Folksmanda verbalized understanding.

## 2017-01-26 ENCOUNTER — Telehealth: Payer: Self-pay | Admitting: *Deleted

## 2017-01-26 DIAGNOSIS — I5031 Acute diastolic (congestive) heart failure: Secondary | ICD-10-CM | POA: Diagnosis not present

## 2017-01-26 DIAGNOSIS — I872 Venous insufficiency (chronic) (peripheral): Secondary | ICD-10-CM | POA: Diagnosis not present

## 2017-01-26 DIAGNOSIS — I130 Hypertensive heart and chronic kidney disease with heart failure and stage 1 through stage 4 chronic kidney disease, or unspecified chronic kidney disease: Secondary | ICD-10-CM | POA: Diagnosis not present

## 2017-01-26 DIAGNOSIS — I82402 Acute embolism and thrombosis of unspecified deep veins of left lower extremity: Secondary | ICD-10-CM | POA: Diagnosis not present

## 2017-01-26 DIAGNOSIS — N189 Chronic kidney disease, unspecified: Secondary | ICD-10-CM | POA: Diagnosis not present

## 2017-01-26 DIAGNOSIS — L97512 Non-pressure chronic ulcer of other part of right foot with fat layer exposed: Secondary | ICD-10-CM | POA: Diagnosis not present

## 2017-01-26 NOTE — Telephone Encounter (Signed)
His Coumadin was adjusted, please see sheet, family stated to the home health nurse that the patient has been taking a large portion of Aleve. I am form the home health nurse to tell the patient and family absolutely no anti-inflammatories. I also informed them that lack of compliance could result in Coumadin being discontinued because of increased risk

## 2017-01-26 NOTE — Telephone Encounter (Signed)
[name] [name] at home health called to report INR of 2.5

## 2017-02-01 DIAGNOSIS — I999 Unspecified disorder of circulatory system: Secondary | ICD-10-CM | POA: Diagnosis not present

## 2017-02-01 DIAGNOSIS — I96 Gangrene, not elsewhere classified: Secondary | ICD-10-CM | POA: Diagnosis not present

## 2017-02-01 DIAGNOSIS — L97514 Non-pressure chronic ulcer of other part of right foot with necrosis of bone: Secondary | ICD-10-CM | POA: Diagnosis not present

## 2017-02-01 DIAGNOSIS — I70261 Atherosclerosis of native arteries of extremities with gangrene, right leg: Secondary | ICD-10-CM | POA: Diagnosis not present

## 2017-02-02 ENCOUNTER — Telehealth: Payer: Self-pay | Admitting: *Deleted

## 2017-02-02 DIAGNOSIS — I130 Hypertensive heart and chronic kidney disease with heart failure and stage 1 through stage 4 chronic kidney disease, or unspecified chronic kidney disease: Secondary | ICD-10-CM | POA: Diagnosis not present

## 2017-02-02 DIAGNOSIS — N189 Chronic kidney disease, unspecified: Secondary | ICD-10-CM | POA: Diagnosis not present

## 2017-02-02 DIAGNOSIS — L97512 Non-pressure chronic ulcer of other part of right foot with fat layer exposed: Secondary | ICD-10-CM | POA: Diagnosis not present

## 2017-02-02 DIAGNOSIS — I82402 Acute embolism and thrombosis of unspecified deep veins of left lower extremity: Secondary | ICD-10-CM | POA: Diagnosis not present

## 2017-02-02 DIAGNOSIS — I5031 Acute diastolic (congestive) heart failure: Secondary | ICD-10-CM | POA: Diagnosis not present

## 2017-02-02 DIAGNOSIS — I872 Venous insufficiency (chronic) (peripheral): Secondary | ICD-10-CM | POA: Diagnosis not present

## 2017-02-02 NOTE — Telephone Encounter (Signed)
[name] from [facility] calling to report INR today 1.9. Takes coumadin 5mg  on sun, tues, thurs, and sat. And 2.5mg  on mon, wed, and frid. Consult with dr Lorin Picketscott. Cst. Recheck one week. Byrd HesselbachMaria verbalized understanding.

## 2017-02-02 NOTE — Telephone Encounter (Signed)
ihave spoken with family multiple times about their need to help monitor pts' meds, i'm not sure what else we can do since h h is discharging pt, pt also has refused strong recommendation to press on with amputation, once again cannot be forced into that

## 2017-02-02 NOTE — Telephone Encounter (Signed)
[name] [name] nurse from [facility] wants to let dr Brett Canalessteve know that pt contact is up next week with [facility] because he is not following wound care instructions. He did see another wound care center yesterday. Also wanted to let  You know he is not taking lasix and care taker told her he has bought another bottle of aleve to take for pain. [name] HesselbachMaria states she called and talked to dr scott about him taking #100 tablets from 7/23 - 8/2.

## 2017-02-06 ENCOUNTER — Ambulatory Visit (INDEPENDENT_AMBULATORY_CARE_PROVIDER_SITE_OTHER): Payer: Medicare Other | Admitting: Family Medicine

## 2017-02-06 ENCOUNTER — Encounter: Payer: Self-pay | Admitting: Family Medicine

## 2017-02-06 VITALS — BP 130/74 | Temp 97.5°F | Ht 71.0 in | Wt 193.0 lb

## 2017-02-06 DIAGNOSIS — L97504 Non-pressure chronic ulcer of other part of unspecified foot with necrosis of bone: Secondary | ICD-10-CM | POA: Diagnosis not present

## 2017-02-06 MED ORDER — CIPROFLOXACIN HCL 500 MG PO TABS: 500.0000 mg | ORAL_TABLET | Freq: Two times a day (BID) | ORAL | 0 refills | Status: AC

## 2017-02-06 MED ORDER — AMOXICILLIN-POT CLAVULANATE 875-125 MG PO TABS: 1.0000 | ORAL_TABLET | Freq: Two times a day (BID) | ORAL | 0 refills | Status: AC

## 2017-02-06 NOTE — Progress Notes (Signed)
   Subjective:    Patient ID: [name] [name], male    DOB: 07/04/1932, 83 y.o.   MRN: [id number]  Foot Pain  This is a recurrent problem. The current episode started yesterday. Associated symptoms comments: Edema .  Patient presents with more pain and swelling in right foot.  No fever chills.  Has required all his pain medicine.  Fair appetite.  Family members continue to assist in dressing.   discharge and smell, also  Patient also has c/o right leg pain.    Review of Systems No headache, no major weight loss or weight gain, no chest pain no back pain abdominal pain no change in bowel habits complete ROS otherwise negative     Objective:   Physical Exam  Alert and oriented, vitals reviewed and stable, NAD ENT-TM's and ext canals WNL bilat via otoscopic exam Soft palate, tonsils and post pharynx WNL via oropharyngeal exam Neck-symmetric, no masses; thyroid nonpalpable and nontender Pulmonary-no tachypnea or accessory muscle use; Clear without wheezes via auscultation Card--no abnrml murmurs, rhythm reg and rate WNL Carotid pulses symmetric, without bruits Right foot and ankle erythematous. Capillary refill intact. Pulses minimal. Chronic dorsal ulceration right foot. Increased erythema and edema of ankle. Right toe chronic ulceration/deterioration with open when Left ankle some weeping from superficial ulceration at the ankle     Assessment & Plan:  Impressive progressive deterioration right foot. Vascular surgeon has already recommended amputation. Patient very resistant. Home health is in the process of stepping away from patient because of noncompliance. I spoke with family length. They are frustrated. If they will not force him nor should day to proceed with amputation. Long discussion with patient. We'll do trial of antibiotics encouraged to get back to wound care specialist  Greater than 50% of this 25 minute face to face visit was spent in counseling and discussion and  coordination of care regarding the above diagnosis/diagnosies

## 2017-02-08 DIAGNOSIS — I5031 Acute diastolic (congestive) heart failure: Secondary | ICD-10-CM | POA: Diagnosis not present

## 2017-02-08 DIAGNOSIS — I130 Hypertensive heart and chronic kidney disease with heart failure and stage 1 through stage 4 chronic kidney disease, or unspecified chronic kidney disease: Secondary | ICD-10-CM | POA: Diagnosis not present

## 2017-02-08 DIAGNOSIS — I82402 Acute embolism and thrombosis of unspecified deep veins of left lower extremity: Secondary | ICD-10-CM | POA: Diagnosis not present

## 2017-02-08 DIAGNOSIS — N189 Chronic kidney disease, unspecified: Secondary | ICD-10-CM | POA: Diagnosis not present

## 2017-02-08 DIAGNOSIS — L97512 Non-pressure chronic ulcer of other part of right foot with fat layer exposed: Secondary | ICD-10-CM | POA: Diagnosis not present

## 2017-02-08 DIAGNOSIS — I872 Venous insufficiency (chronic) (peripheral): Secondary | ICD-10-CM | POA: Diagnosis not present

## 2017-02-13 ENCOUNTER — Telehealth: Payer: Self-pay

## 2017-02-13 NOTE — Telephone Encounter (Signed)
Pt said he is having difficulty swallowing again. He feels his esophagus needs stretching. I have scheduled him an OV with Wynne Dust, NP on 02/16/2017 at 11:00 Am. He is aware to be here at 10:45 Am.

## 2017-02-16 ENCOUNTER — Encounter: Payer: Self-pay | Admitting: Internal Medicine

## 2017-02-16 ENCOUNTER — Encounter: Payer: Self-pay | Admitting: Nurse Practitioner

## 2017-02-16 ENCOUNTER — Ambulatory Visit (INDEPENDENT_AMBULATORY_CARE_PROVIDER_SITE_OTHER): Payer: Medicare Other | Admitting: Nurse Practitioner

## 2017-02-16 VITALS — BP 102/70 | HR 50 | Temp 98.0°F | Ht 71.0 in | Wt 193.4 lb

## 2017-02-16 DIAGNOSIS — R1310 Dysphagia, unspecified: Secondary | ICD-10-CM

## 2017-02-16 DIAGNOSIS — K220 Achalasia of cardia: Secondary | ICD-10-CM | POA: Diagnosis not present

## 2017-02-16 DIAGNOSIS — R1319 Other dysphagia: Secondary | ICD-10-CM

## 2017-02-16 NOTE — Assessment & Plan Note (Signed)
Persistent and recurrent dysphagia. The patient was 5 with a by [facility] in 2017 and deemed likely achalasia and was given treatment options and recommendations for further evaluation including pneumatic dilation, times barium swallow for esophageal emptying and possible treatment options such as Botox. The patient has not been back to [facility] because of difficulty getting there. His last dilation only lasted for one to 2 months. He has symptoms at least every other day, no improvement with soft foods. Has occasional regurgitation. I will discuss with Dr. Jena Gauss scars options for possible local Botox versus referral back to [facility] for continued evaluation and treatment.

## 2017-02-16 NOTE — Progress Notes (Signed)
Referring Provider: Merlyn Albert, MD Primary Care Physician:  Merlyn Albert, MD Primary GI:  Dr. Jena Gauss  Chief Complaint  Patient presents with  . Dysphagia    HPI:   [name] [name] is a 83 y.o. male who presents For complaints of dysphagia. The patient was last seen in our office 09/24/2015 for constipation and dysphagia. At that time he stated his dysphagia symptoms are minimally improved with dysphagia diet but still persistent and multiple times a week with occasional regurgitation. Choking a scary for him. Barium pill esophagram showed esophageal dysmotility as well as a relative narrowing at the end of the esophagus with transient sticking of the barium pill. He did eventually proceed with upper endoscopy.  EGD completed 10/01/2015 which found mildly severe esophagitis likely pill-induced, narrowed GE junction without fixed lesion status post Maloney dilation. Normal stomach and duodenum. Recommend continue current medications. Scheduled manometry in [facility] in the near future. Evaluation by [facility] indicated likely achalasia. Recommended attempt at pneumatic dilation with a 30 mm balloon to see what this is help as well as a timed barium esophagram to measure esophageal emptying. Other treatment options including Botox injections, Heller myotomy. It appears they were unable to reach the patient to schedule further evaluation.  Today he states he did not go back to [facility] due to difficulty getting to and from [facility]. Started having dysphagia again about 1-2 months after EGD and dilation. Has solid food and pill dysphagia. Symptoms about every other day "at least." Has tried soft foods and still has problems when he has dysphgia. Occasional regurgitation. Denies hematochezia, melena, fever, chills, unintentional weight loss. Occasional dyspnea which he attributes to "heart problems, Dr. Brett Canales said I have a little congestive heart." Denies chest pain, dizziness, lightheadedness,  syncope, near syncope. Denies any other upper or lower GI symptoms.  Past Medical History:  Diagnosis Date  . Achalasia   . Alcohol abuse    stop drinking since 01/2009  . Arthritis   . Asthma   . Atrial fibrillation (HCC)    off coumadin secondary to fall and subdural  hematoma   . B12 deficiency 9/10   started injection   . BPH (benign prostatic hyperplasia)   . Carcinoma in situ of bladder 2014   Dr. Annabell Howells  . Chronic kidney disease    RECENT UTI FEW MONTHS AGO TX WITH ANTIBIOTIC  . Compression fracture   . COPD (chronic obstructive pulmonary disease) (HCC)   . Diverticula of colon    few small scattered in the sigmoid colon  . Enlarged prostate   . External hemorrhoids 09/2014  . GERD (gastroesophageal reflux disease)   . H/O hiatal hernia   . Headache(784.0)   . Hypertension   . Nutcracker esophagus 2002   on EM  . Osteoporosis   . Peripheral edema   . Reflux   . Venous stasis    chronic     Past Surgical History:  Procedure Laterality Date  . BLADDER ASPIRATION     PROC FOR CANCER CELLS   . CATARACT EXTRACTION W/PHACO  07/30/2012   Procedure: CATARACT EXTRACTION PHACO AND INTRAOCULAR LENS PLACEMENT (IOC);  Surgeon: Susa Simmonds, MD;  Location: [facility];  Service: Ophthalmology;  Laterality: Left;  CDE:31.66  . CATARACT EXTRACTION W/PHACO Right 11/12/2012   Procedure: CATARACT EXTRACTION PHACO AND INTRAOCULAR LENS PLACEMENT (IOC);  Surgeon: Susa Simmonds, MD;  Location: [facility];  Service: Ophthalmology;  Laterality: Right;  CDE 18.25  . CYSTOSCOPY  10/18/2011   Procedure: CYSTOSCOPY;  Surgeon: Anner Crete, MD;  Location: [facility];  Service: Urology;  Laterality: N/A;  . ESOPHAGOGASTRODUODENOSCOPY  02/11/2010   RMR: GE narrowing s/p dilation 56 & 58 Fr maloney otherwise normal  . ESOPHAGOGASTRODUODENOSCOPY N/A 10/01/2015   Dr.Rourk- mildly severe esophagitis, likely pill-induced, narrowed GE junction without fixed lesion found ?query achalasia, s/p maloney dilation,  nomal stomach, normal second portion of the duodenum  . HERNIA REPAIR  12 YRS AGO   RIGHT SIDE   . JOINT REPLACEMENT     RT HIP X2  (1 REPLACEMENT)  . LUNG BIOPSY  10 YRS AGO   BENIGN  . MALONEY DILATION N/A 10/01/2015   Procedure: [name] DILATION;  Surgeon: Corbin Ade, MD;  Location: [facility] SUITE;  Service: Endoscopy;  Laterality: N/A;  . PERIPHERAL VASCULAR CATHETERIZATION N/A 10/23/2015   Procedure: Abdominal Aortogram w/Lower Extremity;  Surgeon: Sherren Kerns, MD;  Location: [facility] INVASIVE CV LAB;  Service: Cardiovascular;  Laterality: N/A;  . RHINOPLASTY  1974    Current Outpatient Prescriptions  Medication Sig Dispense Refill  . amoxicillin-clavulanate (AUGMENTIN) 875-125 MG tablet Take 1 tablet by mouth 2 (two) times daily. 20 tablet 0  . CALCIUM PO Take 1 tablet by mouth daily.    . ciprofloxacin (CIPRO) 500 MG tablet Take 1 tablet (500 mg total) by mouth 2 (two) times daily. 20 tablet 0  . Docosahexaenoic Acid (DHA COMPLETE PO) Take 1 capsule by mouth daily.    Marland Kitchen escitalopram (LEXAPRO) 10 MG tablet Take 1 tablet (10 mg total) by mouth daily. 30 tablet 5  . furosemide (LASIX) 40 MG tablet Take 2 tablets every morning (Patient taking differently: Take 80 mg by mouth daily. ) 30 tablet 5  . magnesium hydroxide (MILK OF MAGNESIA) 400 MG/5ML suspension Take 15 mLs by mouth daily as needed for mild constipation.    Marland Kitchen oxyCODONE-acetaminophen (PERCOCET) 7.5-325 MG tablet Take 1 tablet by mouth 3 (three) times daily as needed for severe pain. May fill 01/11/2017 90 tablet 0  . polyethylene glycol (MIRALAX / GLYCOLAX) packet Take 17 g by mouth 2 (two) times daily.    . potassium chloride SA (K-DUR,KLOR-CON) 10 MEQ tablet Take 1 tablet (10 mEq total) by mouth daily. 30 tablet 5  . predniSONE (DELTASONE) 20 MG tablet Take 2 tablets by mouth for 5 days 10 tablet 0  . PSYLLIUM PO Take 1 packet by mouth daily.    Marland Kitchen senna (SENOKOT) 8.6 MG TABS tablet Take 1 tablet (8.6 mg total) by mouth at  bedtime.    . verapamil (CALAN-SR) 120 MG CR tablet Take 1 tablet (120 mg total) by mouth at bedtime. 30 tablet 5  . warfarin (COUMADIN) 5 MG tablet Take 1 tablet (5 mg total) by mouth daily at 6 PM. 30 tablet 0   No current facility-administered medications for this visit.     Allergies as of 02/16/2017 - Review Complete 02/16/2017  Allergen Reaction Noted  . Sulfonamide derivatives Hives   . Tramadol Itching     Family History  Problem Relation Age of Onset  . Cancer Brother   . Hypertension Mother   . Diabetes Mother   . Heart attack Mother   . Heart disease Mother   . Colon cancer Neg Hx   . Gastric cancer Neg Hx   . Esophageal cancer Neg Hx     Social History   Social History  . Marital status: Married    Spouse name: N/A  .  Number of children: 2  . Years of education: N/A   Social History Main Topics  . Smoking status: Never Smoker  . Smokeless tobacco: Former Neurosurgeon    Types: Chew    Quit date: 06/28/1991  . Alcohol use No     Comment: history of alcohol abuse stop drinking 8/10 . Alcohol abuse for about 25 years   . Drug use: No  . Sexual activity: Not Asked   Other Topics Concern  . None   Social History Narrative  . None    Review of Systems: General: Negative for anorexia, weight loss, fever, chills, fatigue, weakness. ENT: Negative for hoarseness, difficulty swallowing. CV: Negative for chest pain, angina, palpitations, peripheral edema.  Respiratory: Negative for dyspnea at rest, cough, sputum, wheezing.  GI: See history of present illness. MS: Admits chronic back pain.  Derm: Negative for rash or itching.  Endo: Negative for unusual weight change.  Heme: Negative for bruising or bleeding. Allergy: Negative for rash or hives.   Physical Exam: BP 102/70   Pulse (!) 50   Temp 98 F (36.7 C) (Oral)   Ht 5\' 11"  (1.803 m)   Wt 193 lb 6.4 oz (87.7 kg)   BMI 26.97 kg/m  General:   Alert and oriented. Pleasant and cooperative. Well-nourished  and well-developed.  Eyes:  Without icterus, sclera clear and conjunctiva pink.  Ears:  Normal auditory acuity. Cardiovascular:  S1, S2 present without murmurs appreciated. Extremities without clubbing or edema. Respiratory:  Clear to auscultation bilaterally. No wheezes, rales, or rhonchi. No distress.  Gastrointestinal:  +BS, soft, non-tender and non-distended. No HSM noted. No guarding or rebound. No masses appreciated.  Rectal:  Deferred  Musculoskalatal:  Symmetrical without gross deformities. Neurologic:  Alert and oriented x4;  grossly normal neurologically. Psych:  Alert and cooperative. Normal mood and affect. Heme/Lymph/Immune: No excessive bruising noted.    02/16/2017 11:38 AM   Disclaimer: This note was dictated with voice recognition software. Similar sounding words can inadvertently be transcribed and may not be corrected upon review.

## 2017-02-16 NOTE — Progress Notes (Signed)
CC'D TO PCP °

## 2017-02-16 NOTE — Patient Instructions (Signed)
1. I will discuss your situation with Dr. Jena Gauss to determine what we need to do next. 2. Return for follow-up in 3 months, tentatively. 3. We will call you with further recommendations.

## 2017-02-16 NOTE — Assessment & Plan Note (Signed)
Likely achalasia based on manometry at [facility] and recommended evaluation and treatments as per above and per history of present illness. Return for follow-up in 3 months.

## 2017-03-01 ENCOUNTER — Ambulatory Visit: Payer: Medicare Other

## 2017-03-01 DIAGNOSIS — I96 Gangrene, not elsewhere classified: Secondary | ICD-10-CM | POA: Diagnosis not present

## 2017-03-01 DIAGNOSIS — L97514 Non-pressure chronic ulcer of other part of right foot with necrosis of bone: Secondary | ICD-10-CM | POA: Diagnosis not present

## 2017-03-01 DIAGNOSIS — L97519 Non-pressure chronic ulcer of other part of right foot with unspecified severity: Secondary | ICD-10-CM | POA: Diagnosis not present

## 2017-03-01 DIAGNOSIS — I70261 Atherosclerosis of native arteries of extremities with gangrene, right leg: Secondary | ICD-10-CM | POA: Diagnosis not present

## 2017-03-01 DIAGNOSIS — L97518 Non-pressure chronic ulcer of other part of right foot with other specified severity: Secondary | ICD-10-CM | POA: Diagnosis not present

## 2017-03-01 DIAGNOSIS — L97828 Non-pressure chronic ulcer of other part of left lower leg with other specified severity: Secondary | ICD-10-CM | POA: Diagnosis not present

## 2017-03-01 DIAGNOSIS — L97319 Non-pressure chronic ulcer of right ankle with unspecified severity: Secondary | ICD-10-CM | POA: Diagnosis not present

## 2017-03-09 ENCOUNTER — Ambulatory Visit (INDEPENDENT_AMBULATORY_CARE_PROVIDER_SITE_OTHER): Payer: Medicare Other

## 2017-03-09 DIAGNOSIS — Z7901 Long term (current) use of anticoagulants: Secondary | ICD-10-CM | POA: Diagnosis not present

## 2017-03-09 LAB — POCT INR: INR: 1.3

## 2017-03-10 ENCOUNTER — Other Ambulatory Visit: Payer: Self-pay | Admitting: Family Medicine

## 2017-03-16 ENCOUNTER — Telehealth: Payer: Self-pay | Admitting: Family Medicine

## 2017-03-16 MED ORDER — ESCITALOPRAM OXALATE 10 MG PO TABS: 10.0000 mg | ORAL_TABLET | Freq: Every day | ORAL | 5 refills | Status: DC

## 2017-03-16 NOTE — Telephone Encounter (Signed)
Spoke with patient and informed him per Dr.Steve Luking- we sent in refill on Lexapro to [facility] pharmacy. Patient verbalized understanding.

## 2017-03-16 NOTE — Telephone Encounter (Signed)
lexapro  on med list. Prescribed by dr. Mariea Clonts. Pt last seen here 01/11/17

## 2017-03-16 NOTE — Telephone Encounter (Signed)
Ok six mo worth 

## 2017-03-16 NOTE — Telephone Encounter (Signed)
Patient called and wanted to get a refill on his depression medicine.  He doesn't remember the name of it and he doesn't have the bottle anymore.   Uses [facility] ,  Pt's number is [phone]

## 2017-03-29 ENCOUNTER — Ambulatory Visit (INDEPENDENT_AMBULATORY_CARE_PROVIDER_SITE_OTHER): Payer: Medicare Other | Admitting: Family Medicine

## 2017-03-29 DIAGNOSIS — G894 Chronic pain syndrome: Secondary | ICD-10-CM

## 2017-03-29 DIAGNOSIS — I96 Gangrene, not elsewhere classified: Secondary | ICD-10-CM | POA: Diagnosis not present

## 2017-03-29 DIAGNOSIS — I70261 Atherosclerosis of native arteries of extremities with gangrene, right leg: Secondary | ICD-10-CM | POA: Diagnosis not present

## 2017-03-29 DIAGNOSIS — L97514 Non-pressure chronic ulcer of other part of right foot with necrosis of bone: Secondary | ICD-10-CM | POA: Diagnosis not present

## 2017-03-29 DIAGNOSIS — L97519 Non-pressure chronic ulcer of other part of right foot with unspecified severity: Secondary | ICD-10-CM | POA: Diagnosis not present

## 2017-03-29 DIAGNOSIS — L97828 Non-pressure chronic ulcer of other part of left lower leg with other specified severity: Secondary | ICD-10-CM | POA: Diagnosis not present

## 2017-03-29 MED ORDER — OXYCODONE-ACETAMINOPHEN 10-325 MG PO TABS: ORAL_TABLET | ORAL | 0 refills | Status: DC

## 2017-03-29 NOTE — Progress Notes (Signed)
   Subjective:    Patient ID: [name] [name], male    DOB: Jan 05, 1933, 83 y.o.   MRN: [id number]  HPI Patient seen wound care specialist today who recommended he talk to primary care doctor about pain patches for pain management.  Patient arrives office with son for a protracted discussion. Current oxycodone not helping pain. Just saw his vascular surgeon/wound care specialist earlier today.  Both legs have open weeping sores.  Vascular surgeon has recommended removal of right leg. Patient opposed  Current dose of oxycodone no longer sufficiently managing pain.Vascular specialist/wound care specialist brought up idea of fentanyl patches   Review of Systems No headache, no major weight loss or weight gain, no chest pain no back pain abdominal pain no change in bowel habits complete ROS otherwise negative     Objective:   Physical Exam Alert and oriented, vitals reviewed and stable, NAD ENT-TM's and ext canals WNL bilat via otoscopic exam Soft palate, tonsils and post pharynx WNL via oropharyngeal exam Neck-symmetric, no masses; thyroid nonpalpable and nontender Pulmonary-no tachypnea or accessory muscle use; Clear without wheezes via auscultation Card--no abnrml murmurs, rhythm reg and rate WNL Carotid pulses symmetric, without bruits Both legs covered with bandages       Assessment & Plan:  Impression progressive pain long discussion held. Pluses and minuses of various interventions discussed. Increase oxycodone 10/325 #120 one 4 times a day when necessary for pain/multiple questions answered  Greater than 50% of this 25 minute face to face visit was spent in counseling and discussion and coordination of care regarding the above diagnosis/diagnosies

## 2017-04-10 ENCOUNTER — Inpatient Hospital Stay (HOSPITAL_COMMUNITY)
Admission: EM | Admit: 2017-04-10 | Discharge: 2017-04-20 | DRG: 854 | Disposition: A | Discharge status: Skilled Nursing Facility | Payer: Medicare Other | Attending: Family Medicine | Admitting: Family Medicine

## 2017-04-10 ENCOUNTER — Emergency Department (HOSPITAL_COMMUNITY): Payer: Medicare Other

## 2017-04-10 ENCOUNTER — Encounter (HOSPITAL_COMMUNITY): Payer: Self-pay

## 2017-04-10 DIAGNOSIS — Z89611 Acquired absence of right leg above knee: Secondary | ICD-10-CM | POA: Diagnosis not present

## 2017-04-10 DIAGNOSIS — I731 Thromboangiitis obliterans [Buerger's disease]: Secondary | ICD-10-CM | POA: Diagnosis present

## 2017-04-10 DIAGNOSIS — F329 Major depressive disorder, single episode, unspecified: Secondary | ICD-10-CM | POA: Diagnosis not present

## 2017-04-10 DIAGNOSIS — E876 Hypokalemia: Secondary | ICD-10-CM | POA: Diagnosis not present

## 2017-04-10 DIAGNOSIS — R739 Hyperglycemia, unspecified: Secondary | ICD-10-CM | POA: Diagnosis present

## 2017-04-10 DIAGNOSIS — I10 Essential (primary) hypertension: Secondary | ICD-10-CM | POA: Diagnosis not present

## 2017-04-10 DIAGNOSIS — B999 Unspecified infectious disease: Secondary | ICD-10-CM | POA: Diagnosis not present

## 2017-04-10 DIAGNOSIS — A4102 Sepsis due to Methicillin resistant Staphylococcus aureus: Principal | ICD-10-CM | POA: Diagnosis present

## 2017-04-10 DIAGNOSIS — N179 Acute kidney failure, unspecified: Secondary | ICD-10-CM | POA: Diagnosis not present

## 2017-04-10 DIAGNOSIS — K224 Dyskinesia of esophagus: Secondary | ICD-10-CM | POA: Diagnosis present

## 2017-04-10 DIAGNOSIS — D649 Anemia, unspecified: Secondary | ICD-10-CM | POA: Diagnosis present

## 2017-04-10 DIAGNOSIS — K210 Gastro-esophageal reflux disease with esophagitis: Secondary | ICD-10-CM | POA: Diagnosis present

## 2017-04-10 DIAGNOSIS — L03115 Cellulitis of right lower limb: Secondary | ICD-10-CM | POA: Diagnosis present

## 2017-04-10 DIAGNOSIS — I878 Other specified disorders of veins: Secondary | ICD-10-CM | POA: Diagnosis present

## 2017-04-10 DIAGNOSIS — R531 Weakness: Secondary | ICD-10-CM

## 2017-04-10 DIAGNOSIS — R6520 Severe sepsis without septic shock: Secondary | ICD-10-CM | POA: Diagnosis not present

## 2017-04-10 DIAGNOSIS — R1311 Dysphagia, oral phase: Secondary | ICD-10-CM | POA: Diagnosis not present

## 2017-04-10 DIAGNOSIS — Z89519 Acquired absence of unspecified leg below knee: Secondary | ICD-10-CM | POA: Diagnosis not present

## 2017-04-10 DIAGNOSIS — L03116 Cellulitis of left lower limb: Secondary | ICD-10-CM | POA: Diagnosis present

## 2017-04-10 DIAGNOSIS — Z89511 Acquired absence of right leg below knee: Secondary | ICD-10-CM

## 2017-04-10 DIAGNOSIS — A4159 Other Gram-negative sepsis: Secondary | ICD-10-CM | POA: Diagnosis present

## 2017-04-10 DIAGNOSIS — R102 Pelvic and perineal pain: Secondary | ICD-10-CM | POA: Diagnosis not present

## 2017-04-10 DIAGNOSIS — T402X5A Adverse effect of other opioids, initial encounter: Secondary | ICD-10-CM | POA: Diagnosis present

## 2017-04-10 DIAGNOSIS — R0602 Shortness of breath: Secondary | ICD-10-CM

## 2017-04-10 DIAGNOSIS — M8650 Other chronic hematogenous osteomyelitis, unspecified site: Secondary | ICD-10-CM | POA: Diagnosis not present

## 2017-04-10 DIAGNOSIS — I272 Pulmonary hypertension, unspecified: Secondary | ICD-10-CM | POA: Diagnosis present

## 2017-04-10 DIAGNOSIS — R404 Transient alteration of awareness: Secondary | ICD-10-CM | POA: Diagnosis not present

## 2017-04-10 DIAGNOSIS — R791 Abnormal coagulation profile: Secondary | ICD-10-CM | POA: Diagnosis not present

## 2017-04-10 DIAGNOSIS — N400 Enlarged prostate without lower urinary tract symptoms: Secondary | ICD-10-CM | POA: Diagnosis present

## 2017-04-10 DIAGNOSIS — K219 Gastro-esophageal reflux disease without esophagitis: Secondary | ICD-10-CM

## 2017-04-10 DIAGNOSIS — I5020 Unspecified systolic (congestive) heart failure: Secondary | ICD-10-CM | POA: Diagnosis present

## 2017-04-10 DIAGNOSIS — D62 Acute posthemorrhagic anemia: Secondary | ICD-10-CM | POA: Diagnosis not present

## 2017-04-10 DIAGNOSIS — D6832 Hemorrhagic disorder due to extrinsic circulating anticoagulants: Secondary | ICD-10-CM

## 2017-04-10 DIAGNOSIS — R509 Fever, unspecified: Secondary | ICD-10-CM | POA: Diagnosis present

## 2017-04-10 DIAGNOSIS — K5903 Drug induced constipation: Secondary | ICD-10-CM | POA: Diagnosis present

## 2017-04-10 DIAGNOSIS — K5900 Constipation, unspecified: Secondary | ICD-10-CM | POA: Diagnosis not present

## 2017-04-10 DIAGNOSIS — M869 Osteomyelitis, unspecified: Secondary | ICD-10-CM | POA: Diagnosis not present

## 2017-04-10 DIAGNOSIS — Z882 Allergy status to sulfonamides status: Secondary | ICD-10-CM

## 2017-04-10 DIAGNOSIS — I110 Hypertensive heart disease with heart failure: Secondary | ICD-10-CM | POA: Diagnosis not present

## 2017-04-10 DIAGNOSIS — F17200 Nicotine dependence, unspecified, uncomplicated: Secondary | ICD-10-CM | POA: Diagnosis present

## 2017-04-10 DIAGNOSIS — I480 Paroxysmal atrial fibrillation: Secondary | ICD-10-CM | POA: Diagnosis present

## 2017-04-10 DIAGNOSIS — M8660 Other chronic osteomyelitis, unspecified site: Secondary | ICD-10-CM | POA: Diagnosis present

## 2017-04-10 DIAGNOSIS — N183 Chronic kidney disease, stage 3 (moderate): Secondary | ICD-10-CM | POA: Diagnosis present

## 2017-04-10 DIAGNOSIS — D638 Anemia in other chronic diseases classified elsewhere: Secondary | ICD-10-CM | POA: Diagnosis not present

## 2017-04-10 DIAGNOSIS — I130 Hypertensive heart and chronic kidney disease with heart failure and stage 1 through stage 4 chronic kidney disease, or unspecified chronic kidney disease: Secondary | ICD-10-CM | POA: Diagnosis present

## 2017-04-10 DIAGNOSIS — G894 Chronic pain syndrome: Secondary | ICD-10-CM | POA: Diagnosis not present

## 2017-04-10 DIAGNOSIS — S88019A Complete traumatic amputation at knee level, unspecified lower leg, initial encounter: Secondary | ICD-10-CM | POA: Diagnosis not present

## 2017-04-10 DIAGNOSIS — Z79899 Other long term (current) drug therapy: Secondary | ICD-10-CM

## 2017-04-10 DIAGNOSIS — Z86718 Personal history of other venous thrombosis and embolism: Secondary | ICD-10-CM

## 2017-04-10 DIAGNOSIS — I482 Chronic atrial fibrillation: Secondary | ICD-10-CM | POA: Diagnosis present

## 2017-04-10 DIAGNOSIS — L97519 Non-pressure chronic ulcer of other part of right foot with unspecified severity: Secondary | ICD-10-CM | POA: Diagnosis not present

## 2017-04-10 DIAGNOSIS — I5021 Acute systolic (congestive) heart failure: Secondary | ICD-10-CM | POA: Diagnosis not present

## 2017-04-10 DIAGNOSIS — J449 Chronic obstructive pulmonary disease, unspecified: Secondary | ICD-10-CM | POA: Diagnosis present

## 2017-04-10 DIAGNOSIS — I38 Endocarditis, valve unspecified: Secondary | ICD-10-CM | POA: Diagnosis present

## 2017-04-10 DIAGNOSIS — A419 Sepsis, unspecified organism: Secondary | ICD-10-CM | POA: Diagnosis present

## 2017-04-10 DIAGNOSIS — M810 Age-related osteoporosis without current pathological fracture: Secondary | ICD-10-CM | POA: Diagnosis present

## 2017-04-10 DIAGNOSIS — Z885 Allergy status to narcotic agent status: Secondary | ICD-10-CM

## 2017-04-10 DIAGNOSIS — Z7901 Long term (current) use of anticoagulants: Secondary | ICD-10-CM

## 2017-04-10 DIAGNOSIS — E8770 Fluid overload, unspecified: Secondary | ICD-10-CM | POA: Diagnosis not present

## 2017-04-10 DIAGNOSIS — I739 Peripheral vascular disease, unspecified: Secondary | ICD-10-CM | POA: Diagnosis not present

## 2017-04-10 DIAGNOSIS — I96 Gangrene, not elsewhere classified: Secondary | ICD-10-CM | POA: Diagnosis not present

## 2017-04-10 DIAGNOSIS — Z9981 Dependence on supplemental oxygen: Secondary | ICD-10-CM | POA: Diagnosis not present

## 2017-04-10 DIAGNOSIS — R1310 Dysphagia, unspecified: Secondary | ICD-10-CM | POA: Diagnosis present

## 2017-04-10 DIAGNOSIS — M79671 Pain in right foot: Secondary | ICD-10-CM | POA: Diagnosis not present

## 2017-04-10 DIAGNOSIS — Z888 Allergy status to other drugs, medicaments and biological substances status: Secondary | ICD-10-CM | POA: Diagnosis not present

## 2017-04-10 DIAGNOSIS — B9562 Methicillin resistant Staphylococcus aureus infection as the cause of diseases classified elsewhere: Secondary | ICD-10-CM | POA: Diagnosis present

## 2017-04-10 DIAGNOSIS — M6281 Muscle weakness (generalized): Secondary | ICD-10-CM | POA: Diagnosis not present

## 2017-04-10 DIAGNOSIS — Z96641 Presence of right artificial hip joint: Secondary | ICD-10-CM | POA: Diagnosis present

## 2017-04-10 DIAGNOSIS — R0989 Other specified symptoms and signs involving the circulatory and respiratory systems: Secondary | ICD-10-CM | POA: Diagnosis not present

## 2017-04-10 DIAGNOSIS — I5031 Acute diastolic (congestive) heart failure: Secondary | ICD-10-CM | POA: Diagnosis not present

## 2017-04-10 DIAGNOSIS — Z452 Encounter for adjustment and management of vascular access device: Secondary | ICD-10-CM | POA: Diagnosis not present

## 2017-04-10 DIAGNOSIS — M86671 Other chronic osteomyelitis, right ankle and foot: Secondary | ICD-10-CM | POA: Diagnosis not present

## 2017-04-10 DIAGNOSIS — T45515A Adverse effect of anticoagulants, initial encounter: Secondary | ICD-10-CM | POA: Diagnosis present

## 2017-04-10 DIAGNOSIS — J431 Panlobular emphysema: Secondary | ICD-10-CM | POA: Diagnosis not present

## 2017-04-10 DIAGNOSIS — I82409 Acute embolism and thrombosis of unspecified deep veins of unspecified lower extremity: Secondary | ICD-10-CM | POA: Diagnosis not present

## 2017-04-10 DIAGNOSIS — I998 Other disorder of circulatory system: Secondary | ICD-10-CM | POA: Diagnosis present

## 2017-04-10 DIAGNOSIS — Z886 Allergy status to analgesic agent status: Secondary | ICD-10-CM | POA: Diagnosis not present

## 2017-04-10 DIAGNOSIS — Z8781 Personal history of (healed) traumatic fracture: Secondary | ICD-10-CM

## 2017-04-10 DIAGNOSIS — Z4781 Encounter for orthopedic aftercare following surgical amputation: Secondary | ICD-10-CM | POA: Diagnosis not present

## 2017-04-10 DIAGNOSIS — B964 Proteus (mirabilis) (morganii) as the cause of diseases classified elsewhere: Secondary | ICD-10-CM | POA: Diagnosis present

## 2017-04-10 DIAGNOSIS — I70261 Atherosclerosis of native arteries of extremities with gangrene, right leg: Secondary | ICD-10-CM | POA: Diagnosis not present

## 2017-04-10 DIAGNOSIS — R2689 Other abnormalities of gait and mobility: Secondary | ICD-10-CM | POA: Diagnosis not present

## 2017-04-10 DIAGNOSIS — M1990 Unspecified osteoarthritis, unspecified site: Secondary | ICD-10-CM | POA: Diagnosis present

## 2017-04-10 DIAGNOSIS — A4189 Other specified sepsis: Secondary | ICD-10-CM | POA: Diagnosis not present

## 2017-04-10 DIAGNOSIS — I5033 Acute on chronic diastolic (congestive) heart failure: Secondary | ICD-10-CM | POA: Diagnosis not present

## 2017-04-10 DIAGNOSIS — F1011 Alcohol abuse, in remission: Secondary | ICD-10-CM | POA: Diagnosis present

## 2017-04-10 DIAGNOSIS — J438 Other emphysema: Secondary | ICD-10-CM | POA: Diagnosis not present

## 2017-04-10 DIAGNOSIS — Q677 Pectus carinatum: Secondary | ICD-10-CM

## 2017-04-10 LAB — CBC WITH DIFFERENTIAL/PLATELET
BASOS ABS: 0 10*3/uL (ref 0.0–0.1)
BASOS PCT: 0 %
EOS ABS: 0 10*3/uL (ref 0.0–0.7)
Eosinophils Relative: 0 %
HEMATOCRIT: 30.6 % — AB (ref 39.0–52.0)
HEMOGLOBIN: 9.4 g/dL — AB (ref 13.0–17.0)
Lymphocytes Relative: 2 %
Lymphs Abs: 0.2 10*3/uL — ABNORMAL LOW (ref 0.7–4.0)
MCH: 27.3 pg (ref 26.0–34.0)
MCHC: 30.7 g/dL (ref 30.0–36.0)
MCV: 89 fL (ref 78.0–100.0)
MONOS PCT: 3 %
Monocytes Absolute: 0.3 10*3/uL (ref 0.1–1.0)
NEUTROS ABS: 9.7 10*3/uL — AB (ref 1.7–7.7)
NEUTROS PCT: 95 %
Platelets: 250 10*3/uL (ref 150–400)
RBC: 3.44 MIL/uL — ABNORMAL LOW (ref 4.22–5.81)
RDW: 16.9 % — ABNORMAL HIGH (ref 11.5–15.5)
WBC: 10.3 10*3/uL (ref 4.0–10.5)

## 2017-04-10 LAB — COMPREHENSIVE METABOLIC PANEL
ALBUMIN: 2.8 g/dL — AB (ref 3.5–5.0)
ALK PHOS: 98 U/L (ref 38–126)
ALT: 10 U/L — ABNORMAL LOW (ref 17–63)
ANION GAP: 13 (ref 5–15)
AST: 18 U/L (ref 15–41)
BILIRUBIN TOTAL: 0.8 mg/dL (ref 0.3–1.2)
BUN: 34 mg/dL — ABNORMAL HIGH (ref 6–20)
CALCIUM: 8.7 mg/dL — AB (ref 8.9–10.3)
CO2: 20 mmol/L — ABNORMAL LOW (ref 22–32)
Chloride: 102 mmol/L (ref 101–111)
Creatinine, Ser: 1.07 mg/dL (ref 0.61–1.24)
Glucose, Bld: 141 mg/dL — ABNORMAL HIGH (ref 65–99)
POTASSIUM: 3.8 mmol/L (ref 3.5–5.1)
Sodium: 135 mmol/L (ref 135–145)
TOTAL PROTEIN: 6.8 g/dL (ref 6.5–8.1)

## 2017-04-10 LAB — URINALYSIS, ROUTINE W REFLEX MICROSCOPIC
BACTERIA UA: NONE SEEN
BILIRUBIN URINE: NEGATIVE
GLUCOSE, UA: NEGATIVE mg/dL
KETONES UR: 5 mg/dL — AB
LEUKOCYTES UA: NEGATIVE
NITRITE: NEGATIVE
PH: 5 (ref 5.0–8.0)
PROTEIN: 30 mg/dL — AB
Specific Gravity, Urine: 1.028 (ref 1.005–1.030)

## 2017-04-10 LAB — INFLUENZA PANEL BY PCR (TYPE A & B)
INFLBPCR: NEGATIVE
Influenza A By PCR: NEGATIVE

## 2017-04-10 LAB — SEDIMENTATION RATE: SED RATE: 105 mm/h — AB (ref 0–16)

## 2017-04-10 LAB — HEMOGLOBIN A1C
Hgb A1c MFr Bld: 5.8 % — ABNORMAL HIGH (ref 4.8–5.6)
Mean Plasma Glucose: 119.76 mg/dL

## 2017-04-10 LAB — PROTIME-INR
INR: 7.44 — AB
PROTHROMBIN TIME: 62.8 s — AB (ref 11.4–15.2)

## 2017-04-10 LAB — C-REACTIVE PROTEIN: CRP: 30.4 mg/dL — AB (ref <1.0)

## 2017-04-10 LAB — CG4 I-STAT (LACTIC ACID): LACTIC ACID, VENOUS: 1.49 mmol/L (ref 0.5–1.9)

## 2017-04-10 LAB — PREALBUMIN: PREALBUMIN: 6.1 mg/dL — AB (ref 18–38)

## 2017-04-10 LAB — I-STAT CG4 LACTIC ACID, ED: LACTIC ACID, VENOUS: 0.84 mmol/L (ref 0.5–1.9)

## 2017-04-10 MED ORDER — SODIUM CHLORIDE 0.9 % IV BOLUS (SEPSIS)
1000.0000 mL | Freq: Once | INTRAVENOUS | Status: AC
  Administered 2017-04-10: 1000 mL via INTRAVENOUS

## 2017-04-10 MED ORDER — LACTATED RINGERS IV SOLN
INTRAVENOUS | Status: DC
  Administered 2017-04-10: 12:00:00 via INTRAVENOUS

## 2017-04-10 MED ORDER — POTASSIUM CHLORIDE CRYS ER 10 MEQ PO TBCR
10.0000 meq | EXTENDED_RELEASE_TABLET | Freq: Every day | ORAL | Status: DC
  Administered 2017-04-10: 10 meq via ORAL
  Filled 2017-04-10: qty 1

## 2017-04-10 MED ORDER — BACITRACIN-NEOMYCIN-POLYMYXIN 400-5-5000 EX OINT
TOPICAL_OINTMENT | Freq: Once | CUTANEOUS | Status: AC
  Administered 2017-04-10: 1 via TOPICAL
  Filled 2017-04-10: qty 6

## 2017-04-10 MED ORDER — SODIUM CHLORIDE 0.9 % IV SOLN
INTRAVENOUS | Status: AC
  Administered 2017-04-10: 14:00:00 via INTRAVENOUS

## 2017-04-10 MED ORDER — IBUPROFEN 400 MG PO TABS
400.0000 mg | ORAL_TABLET | Freq: Once | ORAL | Status: AC
  Administered 2017-04-10: 400 mg via ORAL
  Filled 2017-04-10: qty 1

## 2017-04-10 MED ORDER — PIPERACILLIN-TAZOBACTAM 3.375 G IVPB 30 MIN
3.3750 g | Freq: Once | INTRAVENOUS | Status: AC
  Administered 2017-04-10: 3.375 g via INTRAVENOUS
  Filled 2017-04-10: qty 50

## 2017-04-10 MED ORDER — VANCOMYCIN HCL IN DEXTROSE 1-5 GM/200ML-% IV SOLN
1000.0000 mg | Freq: Two times a day (BID) | INTRAVENOUS | Status: DC
  Administered 2017-04-10 – 2017-04-14 (×8): 1000 mg via INTRAVENOUS
  Filled 2017-04-10 (×10): qty 200

## 2017-04-10 MED ORDER — ACETAMINOPHEN 650 MG RE SUPP: 650.0000 mg | Freq: Four times a day (QID) | RECTAL | Status: DC | PRN

## 2017-04-10 MED ORDER — ACETAMINOPHEN 325 MG PO TABS
650.0000 mg | ORAL_TABLET | Freq: Four times a day (QID) | ORAL | Status: DC | PRN
  Filled 2017-04-10: qty 2

## 2017-04-10 MED ORDER — HYDROMORPHONE HCL 1 MG/ML IJ SOLN
1.0000 mg | Freq: Once | INTRAMUSCULAR | Status: AC
  Administered 2017-04-10: 1 mg via INTRAVENOUS
  Filled 2017-04-10: qty 1

## 2017-04-10 MED ORDER — VERAPAMIL HCL ER 120 MG PO TBCR
120.0000 mg | EXTENDED_RELEASE_TABLET | Freq: Every day | ORAL | Status: DC
  Administered 2017-04-11 – 2017-04-17 (×7): 120 mg via ORAL
  Filled 2017-04-10 (×10): qty 1

## 2017-04-10 MED ORDER — ONDANSETRON HCL 4 MG/2ML IJ SOLN: 4.0000 mg | Freq: Four times a day (QID) | INTRAMUSCULAR | Status: DC | PRN

## 2017-04-10 MED ORDER — GADOBENATE DIMEGLUMINE 529 MG/ML IV SOLN
20.0000 mL | Freq: Once | INTRAVENOUS | Status: AC | PRN
  Administered 2017-04-10: 17 mL via INTRAVENOUS

## 2017-04-10 MED ORDER — VITAMIN K1 10 MG/ML IJ SOLN
2.0000 mg | Freq: Once | INTRAMUSCULAR | Status: AC
  Administered 2017-04-10: 2 mg via SUBCUTANEOUS
  Filled 2017-04-10: qty 1

## 2017-04-10 MED ORDER — FENTANYL CITRATE (PF) 100 MCG/2ML IJ SOLN
50.0000 ug | Freq: Once | INTRAMUSCULAR | Status: AC
  Administered 2017-04-10: 50 ug via INTRAVENOUS
  Filled 2017-04-10: qty 2

## 2017-04-10 MED ORDER — DEXTROSE 5 % IV SOLN
2.0000 g | Freq: Once | INTRAVENOUS | Status: AC
  Administered 2017-04-10: 2 g via INTRAVENOUS
  Filled 2017-04-10: qty 2

## 2017-04-10 MED ORDER — OXYCODONE-ACETAMINOPHEN 5-325 MG PO TABS
1.0000 | ORAL_TABLET | Freq: Four times a day (QID) | ORAL | Status: DC | PRN
  Administered 2017-04-10 – 2017-04-12 (×5): 1 via ORAL
  Filled 2017-04-10 (×6): qty 1

## 2017-04-10 MED ORDER — ONDANSETRON HCL 4 MG PO TABS: 4.0000 mg | ORAL_TABLET | Freq: Four times a day (QID) | ORAL | Status: DC | PRN

## 2017-04-10 MED ORDER — METRONIDAZOLE IN NACL 5-0.79 MG/ML-% IV SOLN
500.0000 mg | Freq: Three times a day (TID) | INTRAVENOUS | Status: DC
  Administered 2017-04-10 – 2017-04-13 (×11): 500 mg via INTRAVENOUS
  Filled 2017-04-10 (×13): qty 100

## 2017-04-10 MED ORDER — DEXTROSE 5 % IV SOLN
1.0000 g | Freq: Two times a day (BID) | INTRAVENOUS | Status: DC
  Administered 2017-04-11 – 2017-04-13 (×5): 1 g via INTRAVENOUS
  Filled 2017-04-10 (×6): qty 1

## 2017-04-10 MED ORDER — POLYETHYLENE GLYCOL 3350 17 G PO PACK: 17.0000 g | PACK | Freq: Two times a day (BID) | ORAL | Status: DC

## 2017-04-10 MED ORDER — VERAPAMIL HCL 120 MG PO TABS
ORAL_TABLET | ORAL | Status: AC
  Filled 2017-04-10: qty 1

## 2017-04-10 MED ORDER — OXYCODONE-ACETAMINOPHEN 10-325 MG PO TABS: 1.0000 | ORAL_TABLET | Freq: Four times a day (QID) | ORAL | Status: DC | PRN

## 2017-04-10 MED ORDER — ESCITALOPRAM OXALATE 10 MG PO TABS
10.0000 mg | ORAL_TABLET | Freq: Every day | ORAL | Status: DC
  Administered 2017-04-10 – 2017-04-20 (×11): 10 mg via ORAL
  Filled 2017-04-10 (×15): qty 1

## 2017-04-10 MED ORDER — OXYCODONE HCL 5 MG PO TABS
5.0000 mg | ORAL_TABLET | Freq: Four times a day (QID) | ORAL | Status: DC | PRN
  Administered 2017-04-10 – 2017-04-13 (×7): 5 mg via ORAL
  Filled 2017-04-10 (×9): qty 1

## 2017-04-10 MED ORDER — PIPERACILLIN-TAZOBACTAM 3.375 G IVPB
3.3750 g | Freq: Three times a day (TID) | INTRAVENOUS | Status: DC
  Filled 2017-04-10: qty 50

## 2017-04-10 MED ORDER — LIDOCAINE HCL 2 % EX GEL
1.0000 "application " | Freq: Once | CUTANEOUS | Status: AC
  Administered 2017-04-10: 1 via TOPICAL
  Filled 2017-04-10: qty 10

## 2017-04-10 MED ORDER — SENNA 8.6 MG PO TABS
1.0000 | ORAL_TABLET | Freq: Every day | ORAL | Status: DC
  Administered 2017-04-11: 8.6 mg via ORAL
  Filled 2017-04-10 (×3): qty 1

## 2017-04-10 MED ORDER — VANCOMYCIN HCL IN DEXTROSE 1-5 GM/200ML-% IV SOLN
1000.0000 mg | Freq: Once | INTRAVENOUS | Status: AC
  Administered 2017-04-10: 1000 mg via INTRAVENOUS
  Filled 2017-04-10: qty 200

## 2017-04-10 NOTE — ED Notes (Signed)
Report given to Amy, RN [facility]N.

## 2017-04-10 NOTE — Progress Notes (Signed)
Pharmacy Note:  Initial antibiotics for Vancomycin, Cefepime and Zosyn ordered by EDP for sepsis.  Estimated Creatinine Clearance: 56.7 mL/min (by C-G formula based on SCr of 1.07 mg/dL).   Allergies  Allergen Reactions  . Sulfonamide Derivatives Hives  . Tramadol Itching    Vitals:   04/10/17 1736 04/10/17 1850  BP: (!) 141/68 (!) 128/59  Pulse: (!) 55 98  Resp: (!) 22 (!) 21  Temp:    SpO2: 90% 90%    Anti-infectives    Start     Dose/Rate Route Frequency Ordered Stop   04/10/17 2200  vancomycin (VANCOCIN) IVPB 1000 mg/200 mL premix     1,000 mg 200 mL/hr over 60 Minutes Intravenous Every 12 hours 04/10/17 1901     04/10/17 2000  ceFEPIme (MAXIPIME) 2 g in dextrose 5 % 50 mL IVPB     2 g 100 mL/hr over 30 Minutes Intravenous  Once 04/10/17 1859     04/10/17 1800  piperacillin-tazobactam (ZOSYN) IVPB 3.375 g     3.375 g 12.5 mL/hr over 240 Minutes Intravenous Every 8 hours 04/10/17 1553     04/10/17 1736  metroNIDAZOLE (FLAGYL) IVPB 500 mg     500 mg 100 mL/hr over 60 Minutes Intravenous Every 8 hours 04/10/17 1736     04/10/17 0945  piperacillin-tazobactam (ZOSYN) IVPB 3.375 g     3.375 g 100 mL/hr over 30 Minutes Intravenous  Once 04/10/17 0941 04/10/17 1044   04/10/17 0945  vancomycin (VANCOCIN) IVPB 1000 mg/200 mL premix     1,000 mg 200 mL/hr over 60 Minutes Intravenous  Once 04/10/17 0941 04/10/17 1114     Plan: Initial doses of Vancomycin 1gm IV q12hordered. Zosyn 3.375gm IV q8h ordered Cefepime 2gm IV x 1 ordered to be given in ED F/U admission orders for further dosing if therapy continued.  Wayland Denis, [facility] 04/10/2017 7:02 PM

## 2017-04-10 NOTE — ED Notes (Signed)
[name] [name], Son [phone] Call when pt is transferred.

## 2017-04-10 NOTE — ED Provider Notes (Signed)
Emergency Department Provider Note   I have reviewed the triage vital signs and the nursing notes.   HISTORY  Chief Complaint Fever   HPI [name] [name] is a 83 y.o. male who is a difficult historian presents to the emergency department today secondary to worsening weakness. Family states that he has had a persistent abnormality of his right toes been seen by multiple people however over the last week it seems to have gotten worse and swelling in his foot along with worsening erythema as well. Patient had an oral temperature with EMS of 100.0 otherwise vital signs are within normal limits.right ear for further evaluation. Patient states pain in that foot but also has pain elsewhere. He also states his lower extremity swelling but this is not new for him.No respiratory issues. No headache. No other associated or modifying symptoms. Once again this has been going on for 2 years but acutely worsened over the last week.   Past Medical History:  Diagnosis Date  . Achalasia   . Alcohol abuse    stop drinking since 01/2009  . Arthritis   . Asthma   . Atrial fibrillation (HCC)    off coumadin secondary to fall and subdural  hematoma   . B12 deficiency 9/10   started injection   . BPH (benign prostatic hyperplasia)   . Carcinoma in situ of bladder 2014   Dr. Annabell Howells  . Chronic kidney disease    RECENT UTI FEW MONTHS AGO TX WITH ANTIBIOTIC  . Compression fracture   . COPD (chronic obstructive pulmonary disease) (HCC)   . Diverticula of colon    few small scattered in the sigmoid colon  . Enlarged prostate   . External hemorrhoids 09/2014  . GERD (gastroesophageal reflux disease)   . H/O hiatal hernia   . Headache(784.0)   . Hypertension   . Nutcracker esophagus 2002   on EM  . Osteoporosis   . Peripheral edema   . Reflux   . Venous stasis    chronic     Patient Active Problem List   Diagnosis Date Noted  . DVT (deep venous thrombosis) (HCC) 11/17/2016  . Osteomyelitis (HCC)  11/16/2016  . Pressure injury of skin 10/06/2016  . Acute diastolic CHF (congestive heart failure) (HCC) 10/06/2016  . Left leg DVT (HCC) 10/04/2016  . CAP (community acquired pneumonia) 10/04/2016  . AF (paroxysmal atrial fibrillation) (HCC) 10/04/2016  . Acute respiratory failure with hypoxia (HCC) 10/04/2016  . Depression, major, single episode, moderate (HCC) 08/28/2016  . Achalasia 12/09/2015  . Atherosclerosis of native arteries of extremity with rest pain (HCC) 10/21/2015  . Acute esophagitis   . Congestive heart failure, unspecified 01/01/2014  . Chronic pain syndrome 10/10/2013  . S/P total hip arthroplasty 05/14/2013  . Osteoporosis, unspecified 04/23/2013  . Essential hypertension, benign 09/19/2012  . Status post hip replacement 04/12/2012  . Spinal stenosis of lumbar region 05/03/2011  . TOTAL HIP FOLLOW-UP 09/13/2010  . NONUNION OF FRACTURE 07/08/2010  . HIP FRACTURE, RIGHT 07/08/2010  . ALCOHOL ABUSE 05/15/2009  . ESOPHAGEAL MOTILITY DISORDER 05/15/2009  . GERD 05/15/2009  . Constipation 05/15/2009  . WEIGHT LOSS, RECENT 05/15/2009  . EXTERNAL HEMORRHOIDS 05/13/2009  . COPD (chronic obstructive pulmonary disease) (HCC) 05/13/2009  . Dysphagia 05/13/2009  . COMPRESSION FRACTURE, SPINE 03/19/2009  . HIGH BLOOD PRESSURE 03/19/2009    Past Surgical History:  Procedure Laterality Date  . BLADDER ASPIRATION     PROC FOR CANCER CELLS   . CATARACT EXTRACTION W/PHACO  07/30/2012   Procedure: CATARACT EXTRACTION PHACO AND INTRAOCULAR LENS PLACEMENT (IOC);  Surgeon: Susa Simmonds, MD;  Location: [facility];  Service: Ophthalmology;  Laterality: Left;  CDE:31.66  . CATARACT EXTRACTION W/PHACO Right 11/12/2012   Procedure: CATARACT EXTRACTION PHACO AND INTRAOCULAR LENS PLACEMENT (IOC);  Surgeon: Susa Simmonds, MD;  Location: [facility];  Service: Ophthalmology;  Laterality: Right;  CDE 18.25  . CYSTOSCOPY  10/18/2011   Procedure: CYSTOSCOPY;  Surgeon: Anner Crete, MD;   Location: [facility];  Service: Urology;  Laterality: N/A;  . ESOPHAGOGASTRODUODENOSCOPY  02/11/2010   RMR: GE narrowing s/p dilation 56 & 58 Fr maloney otherwise normal  . ESOPHAGOGASTRODUODENOSCOPY N/A 10/01/2015   Dr.Rourk- mildly severe esophagitis, likely pill-induced, narrowed GE junction without fixed lesion found ?query achalasia, s/p maloney dilation, nomal stomach, normal second portion of the duodenum  . HERNIA REPAIR  12 YRS AGO   RIGHT SIDE   . JOINT REPLACEMENT     RT HIP X2  (1 REPLACEMENT)  . LUNG BIOPSY  10 YRS AGO   BENIGN  . MALONEY DILATION N/A 10/01/2015   Procedure: [name] DILATION;  Surgeon: Corbin Ade, MD;  Location: [facility];  Service: Endoscopy;  Laterality: N/A;  . PERIPHERAL VASCULAR CATHETERIZATION N/A 10/23/2015   Procedure: Abdominal Aortogram w/Lower Extremity;  Surgeon: Sherren Kerns, MD;  Location: [facility] INVASIVE CV LAB;  Service: Cardiovascular;  Laterality: N/A;  . RHINOPLASTY  1974    Current Outpatient Rx  . Order #: [id number] Class: Historical Med  . Order #: [id number] Class: Normal  . Order #: [id number] Class: Normal  . Order #: [id number] Class: Historical Med  . Order #: [id number] Class: Print  . Order #: [id number] Class: No Print  . Order #: [id number] Class: Normal  . Order #: [id number] Class: Historical Med  . Order #: [id number] Class: No Print  . Order #: [id number] Class: Normal  . Order #: [id number] Class: Normal    Allergies Sulfonamide derivatives and Tramadol  Family History  Problem Relation Age of Onset  . Cancer Brother   . Hypertension Mother   . Diabetes Mother   . Heart attack Mother   . Heart disease Mother   . Colon cancer Neg Hx   . Gastric cancer Neg Hx   . Esophageal cancer Neg Hx     Social History Social History  Substance Use Topics  . Smoking status: Never Smoker  . Smokeless tobacco: Former Neurosurgeon    Types: Chew    Quit date: 06/28/1991  . Alcohol use No     Comment: history of alcohol abuse stop drinking 8/10 .  Alcohol abuse for about 25 years     Review of Systems  All other systems negative except as documented in the HPI. All pertinent positives and negatives as reviewed in the HPI. ____________________________________________   PHYSICAL EXAM:  VITAL SIGNS: ED Triage Vitals  Enc Vitals Group     BP 04/10/17 0930 134/64     Pulse Rate 04/10/17 1100 95     Resp 04/10/17 0930 (!) 24     Temp 04/10/17 0927 (!) 105.2 F (40.7 C)     Temp Source 04/10/17 0927 Rectal     SpO2 04/10/17 1100 97 %     Weight 04/10/17 0904 190 lb (86.2 kg)     Height 04/10/17 0904  (1.803 m)    Constitutional: Alert and oriented. Well appearing and in no acute distress. Eyes: Conjunctivae are normal. PERRL. EOMI. Head: Atraumatic. Nose: No congestion/rhinnorhea. Mouth/Throat: Mucous membranes are dry.  Oropharynx non-erythematous. Neck: No stridor.  No meningeal signs.   Cardiovascular: tachycardic rate, regular rhythm. Good peripheral circulation. Grossly normal heart sounds.   Respiratory: tachypneic respiratory effort.  No retractions. Lungs CTAB. Gastrointestinal: Soft and nontender. No distention.  Musculoskeletal: No gross deformities of extremities. Neurologic:  Normal speech and language. No gross focal neurologic deficits are appreciated.  Skin:  Multiple ulcers and denuded areas of skin. Necrotic right big toe with swelling to mid foot associated with erythema and ttp.            ____________________________________________   LABS (all labs ordered are listed, but only abnormal results are displayed)  Labs Reviewed  COMPREHENSIVE METABOLIC PANEL - Abnormal; Notable for the following:       Result Value   CO2 20 (*)    Glucose, Bld 141 (*)    BUN 34 (*)    Calcium 8.7 (*)    Albumin 2.8 (*)    ALT 10 (*)    All other components within normal limits  CBC WITH DIFFERENTIAL/PLATELET - Abnormal; Notable for the following:    RBC 3.44 (*)    Hemoglobin 9.4 (*)    HCT 30.6  (*)    RDW 16.9 (*)    Neutro Abs 9.7 (*)    Lymphs Abs 0.2 (*)    All other components within normal limits  PROTIME-INR - Abnormal; Notable for the following:    Prothrombin Time 62.8 (*)    INR 7.44 (*)    All other components within normal limits  CULTURE, BLOOD (ROUTINE X 2)  CULTURE, BLOOD (ROUTINE X 2)  URINALYSIS, ROUTINE W REFLEX MICROSCOPIC  INFLUENZA PANEL BY PCR (TYPE A & B)  I-STAT CG4 LACTIC ACID, ED  I-STAT CG4 LACTIC ACID, ED  CG4 I-STAT (LACTIC ACID)  I-STAT CG4 LACTIC ACID, ED   ____________________________________________  EKG   EKG Interpretation  Date/Time:  Monday April 10 2017 09:06:11 EDT Ventricular Rate:  125 PR Interval:    QRS Duration: 145 QT Interval:  322 QTC Calculation: 457 R Axis:   74 Text Interpretation:  Atrial fibrillation Right bundle branch block ST depr, consider ischemia, inferior leads rate faster than previously.  Confirmed by Marily Memos [phone]) on 04/10/2017 9:08:57 AM       ____________________________________________  RADIOLOGY  Dg Pelvis 1-2 Views  Result Date: 04/10/2017 CLINICAL DATA:  Fever, pelvic pain. History of falls. Rule out osteomyelitis. EXAM: PELVIS - 1-2 VIEW COMPARISON:  Right hip 05/14/2013 FINDINGS: Right hip replacement in satisfactory position. Negative for fracture. No lucency surrounding the prosthesis. Negative for acute pelvic fracture. Chronic fractures with vertebroplasty at L3 and L4. IMPRESSION: No acute abnormality.  Right hip replacement. Electronically Signed   By: Marlan Palau M.D.   On: 04/10/2017 10:50   Dg Chest Port 1 View  Result Date: 04/10/2017 CLINICAL DATA:  Weakness. EXAM: PORTABLE CHEST 1 VIEW COMPARISON:  Radiographs of October 04, 2016. FINDINGS: Stable cardiomegaly. No pneumothorax is noted. Mild central pulmonary vascular congestion is noted. Mild bibasilar subsegmental atelectasis or infiltrates are noted. No definite pleural effusion is noted. Left midlung opacity is  noted concerning for atelectasis or infiltrate. Bony thorax is unremarkable. IMPRESSION: Stable cardiomegaly with mild central pulmonary vascular congestion. Mild bibasilar subsegmental atelectasis or infiltrates are noted. Mild left perihilar atelectasis or infiltrate is noted. Followup PA and lateral chest X-ray is recommended in 3-4 weeks following trial of antibiotic therapy to ensure resolution and exclude underlying malignancy. Electronically Signed   By: Fayrene Fearing  Christen Butter, M.D.   On: 04/10/2017 09:34   Dg Foot Complete Right  Result Date: 04/10/2017 CLINICAL DATA:  Fever, shortness of Breath. Open sores on right foot EXAM: RIGHT FOOT COMPLETE - 3+ VIEW COMPARISON:  11/16/2016 FINDINGS: Near complete bone destruction noted in the distal phalanx of the right great toe with only a small amount of residual bone seen on the lateral view concerning for acute osteomyelitis. Diffuse osteopenia throughout the foot. The metatarsal heads of the second through fifth digits are poorly visualized which may be projectional, but cannot exclude bone destruction. Again noted is bone destruction about the third PIP joint compatible with osteomyelitis, progressed. IMPRESSION: Near complete bony destruction of the distal phalanx of the right great toe compatible with osteomyelitis. Continued osteomyelitis changes at the third PIP joint. Second through fifth metatarsal heads are not well visualized which may be projectional, but cannot exclude osteomyelitis. May consider further evaluation with MRI with and without contrast. Electronically Signed   By: Charlett Nose M.D.   On: 04/10/2017 10:52    ____________________________________________   PROCEDURES  Procedure(s) performed:   Procedures   ____________________________________________   INITIAL IMPRESSION / ASSESSMENT AND PLAN / ED COURSE  Pertinent labs & imaging results that were available during my care of the patient were reviewed by me and considered in  my medical decision making (see chart for details).  Patient was sepsis suspect is likely related to osteomyelitis. X-ray with evidence of same. Antibiotic started. Plan for admission to medicine.  I discussed the patient with the hospitalist, Dr. Laural Benes who requests surgical evaluation as well.  Spoke with Dr. Allena Katz, who is a podiatrist, requests MRI and will see soon.  ____________________________________________  FINAL CLINICAL IMPRESSION(S) / ED DIAGNOSES  Final diagnoses:  Weakness     MEDICATIONS GIVEN DURING THIS VISIT:  Medications  sodium chloride 0.9 % bolus 1,000 mL (0 mLs Intravenous Stopped 04/10/17 1100)    And  sodium chloride 0.9 % bolus 1,000 mL (1,000 mLs Intravenous New Bag/Given 04/10/17 1010)    And  sodium chloride 0.9 % bolus 1,000 mL (1,000 mLs Intravenous New Bag/Given 04/10/17 1059)  piperacillin-tazobactam (ZOSYN) IVPB 3.375 g (0 g Intravenous Stopped 04/10/17 1044)  vancomycin (VANCOCIN) IVPB 1000 mg/200 mL premix (0 mg Intravenous Stopped 04/10/17 1114)  ibuprofen (ADVIL,MOTRIN) tablet 400 mg (400 mg Oral Given 04/10/17 1010)  fentaNYL (SUBLIMAZE) injection 50 mcg (50 mcg Intravenous Given 04/10/17 1056)     NEW OUTPATIENT MEDICATIONS STARTED DURING THIS VISIT:  New Prescriptions   No medications on file    Note:  This document was prepared using Dragon voice recognition software and may include unintentional dictation errors.   Marily Memos, MD 04/10/17 [phone]

## 2017-04-10 NOTE — H&P (Signed)
History and Physical  [name] [name]:[id number] DOB: 09/05/1932 DOA: 04/10/2017  Referring physician: Erin Hearing  PCP: Merlyn Albert, MD  Vascular Surgery: Trinda Pascal  Chief Complaint: fever  HPI: [name] [name] is a 83 y.o. male with severe peripheral vascular disease who has been dealing with ischemic lower extremities for nearly 2 years.  He was seen by vascular surgery in April 2017 and had an aortogram that showed bilateral popliteal artery occlusions with unreconstructable tibial disease bilaterally. At that time it was recommended that he have a right below-knee amputation but the patient refused.  The patient has continued to have pain and chronic unhealing wounds involving the right lower extremity and left lower extremity. He has a gangrenous right first toe with associated osteomyelitis. The patient reports that for the past several days he's had high fever at home. He has had a worsening of the discoloration of the right first toe. He also has had increasing swelling and weeping of both lower extremities. He has been to multiple emergency rooms and has been followed by a local wound care center with no significant improvement in these wounds. In the emergency department today he was noted to be in sepsis with a fever of 105. He was started on broad-spectrum antibiotic therapy. Hospitalization was requested for further management. Upon speaking with the patient he requested that he be transferred to [facility] where he can be seen by his vascular surgery team. I called and spoke with vascular surgery at [facility] and they agreed to see him in consultation when he arrives.  He was also noted to have an elevated INR 7.44. His hemoglobin was 9.4.  Lactic acid 1.49.  Severity of Illness: The appropriate patient status for this patient is INPATIENT. Inpatient status is judged to be reasonable and necessary in order to provide the required intensity of service to ensure the patient's safety.  The patient's presenting symptoms, physical exam findings, and initial radiographic and laboratory data in the context of their chronic comorbidities is felt to place them at high risk for further clinical deterioration. Furthermore, it is not anticipated that the patient will be medically stable for discharge from the hospital within 2 midnights of admission. The following factors support the patient status of inpatient.   " The patient's presenting symptoms include fever, SOB, generalized weakness . " The worrisome physical exam findings include gangrene right 1st toe, swelling and chronic nonhealing wounds BLEs. " The initial radiographic and laboratory data are worrisome because of INR 7.44. Hg 9.4.  " The chronic co-morbidities include history of VTE, chronic anticoagulation.  * I certify that at the point of admission it is my clinical judgment that the patient will require inpatient hospital care spanning beyond 2 midnights from the point of admission due to high intensity of service, high risk for further deterioration and high frequency of surveillance required.*  Review of Systems: All systems reviewed and apart from history of presenting illness, are negative.  Past Medical History:  Diagnosis Date  . Achalasia   . Alcohol abuse    stop drinking since 01/2009  . Arthritis   . Asthma   . Atrial fibrillation (HCC)    off coumadin secondary to fall and subdural  hematoma   . B12 deficiency 9/10   started injection   . BPH (benign prostatic hyperplasia)   . Carcinoma in situ of bladder 2014   Dr. Annabell Howells  . Chronic kidney disease    RECENT UTI FEW MONTHS  AGO TX WITH ANTIBIOTIC  . Compression fracture   . COPD (chronic obstructive pulmonary disease) (HCC)   . Diverticula of colon    few small scattered in the sigmoid colon  . Enlarged prostate   . External hemorrhoids 09/2014  . GERD (gastroesophageal reflux disease)   . H/O hiatal hernia   . Headache(784.0)   . Hypertension     . Nutcracker esophagus 2002   on EM  . Osteoporosis   . Peripheral edema   . Reflux   . Venous stasis    chronic    Past Surgical History:  Procedure Laterality Date  . BLADDER ASPIRATION     PROC FOR CANCER CELLS   . CATARACT EXTRACTION W/PHACO  07/30/2012   Procedure: CATARACT EXTRACTION PHACO AND INTRAOCULAR LENS PLACEMENT (IOC);  Surgeon: Susa Simmonds, MD;  Location: [facility];  Service: Ophthalmology;  Laterality: Left;  CDE:31.66  . CATARACT EXTRACTION W/PHACO Right 11/12/2012   Procedure: CATARACT EXTRACTION PHACO AND INTRAOCULAR LENS PLACEMENT (IOC);  Surgeon: Susa Simmonds, MD;  Location: [facility];  Service: Ophthalmology;  Laterality: Right;  CDE 18.25  . CYSTOSCOPY  10/18/2011   Procedure: CYSTOSCOPY;  Surgeon: Anner Crete, MD;  Location: [facility];  Service: Urology;  Laterality: N/A;  . ESOPHAGOGASTRODUODENOSCOPY  02/11/2010   RMR: GE narrowing s/p dilation 56 & 58 Fr maloney otherwise normal  . ESOPHAGOGASTRODUODENOSCOPY N/A 10/01/2015   Dr.Rourk- mildly severe esophagitis, likely pill-induced, narrowed GE junction without fixed lesion found ?query achalasia, s/p maloney dilation, nomal stomach, normal second portion of the duodenum  . HERNIA REPAIR  12 YRS AGO   RIGHT SIDE   . JOINT REPLACEMENT     RT HIP X2  (1 REPLACEMENT)  . LUNG BIOPSY  10 YRS AGO   BENIGN  . MALONEY DILATION N/A 10/01/2015   Procedure: [name] DILATION;  Surgeon: Corbin Ade, MD;  Location: [facility] SUITE;  Service: Endoscopy;  Laterality: N/A;  . PERIPHERAL VASCULAR CATHETERIZATION N/A 10/23/2015   Procedure: Abdominal Aortogram w/Lower Extremity;  Surgeon: Sherren Kerns, MD;  Location: [facility] INVASIVE CV LAB;  Service: Cardiovascular;  Laterality: N/A;  . RHINOPLASTY  1974   Social History:  reports that he has never smoked. He quit smokeless tobacco use about 25 years ago. His smokeless tobacco use included Chew. He reports that he does not drink alcohol or use drugs.  Allergies  Allergen  Reactions  . Sulfonamide Derivatives Hives  . Tramadol Itching    Family History  Problem Relation Age of Onset  . Cancer Brother   . Hypertension Mother   . Diabetes Mother   . Heart attack Mother   . Heart disease Mother   . Colon cancer Neg Hx   . Gastric cancer Neg Hx   . Esophageal cancer Neg Hx     Prior to Admission medications   Medication Sig Start Date End Date Taking? Authorizing Provider  CALCIUM PO Take 1 tablet by mouth daily.   Yes [provider]  escitalopram (LEXAPRO) 10 MG tablet Take 1 tablet (10 mg total) by mouth daily. 03/16/17 03/16/18 Yes Merlyn Albert, MD  furosemide (LASIX) 40 MG tablet Take 2 tablets every morning Patient taking differently: Take 80 mg by mouth daily.  01/15/16  Yes Babs Sciara, MD  magnesium hydroxide (MILK OF MAGNESIA) 400 MG/5ML suspension Take 15 mLs by mouth daily as needed for mild constipation.   Yes [provider]  oxyCODONE-acetaminophen (PERCOCET) 10-325 MG tablet One tablet  4 times a day as needed for pain 03/29/17  Yes Luking, Vilinda Blanks, MD  polyethylene glycol (MIRALAX / GLYCOLAX) packet Take 17 g by mouth 2 (two) times daily. 10/08/16  Yes Standley Brooking, MD  potassium chloride SA (K-DUR,KLOR-CON) 10 MEQ tablet Take 1 tablet (10 mEq total) by mouth daily. 10/19/16  Yes Merlyn Albert, MD  PSYLLIUM PO Take 1 packet by mouth daily.   Yes [provider]  senna (SENOKOT) 8.6 MG TABS tablet Take 1 tablet (8.6 mg total) by mouth at bedtime. 10/08/16  Yes Standley Brooking, MD  verapamil (CALAN-SR) 120 MG CR tablet Take 1 tablet (120 mg total) by mouth at bedtime. 12/15/16  Yes Babs Sciara, MD  warfarin (COUMADIN) 5 MG tablet TAKE ONE TABLET BY MOUTH ONCE DAILY AT 6PM. 03/10/17  Yes Merlyn Albert, MD   Physical Exam: Vitals:   04/10/17 1001 04/10/17 1030 04/10/17 1100 04/10/17 1200  BP: 135/87 118/70 118/80 (!) 101/51  Pulse:   95 90  Resp: (!) 26 (!) 30 (!) 27 (!) 23  Temp:   (!)  101.6 F (38.7 C)   TempSrc:   Rectal   SpO2:   97% 96%  Weight:      Height:         General exam: Moderately built and nourished patient, lying comfortably supine on the gurney in no obvious distress.  Head, eyes and ENT: Nontraumatic and normocephalic. Pupils equally reacting to light and accommodation. Oral mucosa dry.  Neck: Supple. No JVD, carotid bruit or thyromegaly.  Lymphatics: No lymphadenopathy.  Respiratory system: Clear to auscultation. No increased work of breathing.  Cardiovascular system: S1 and S2 heard.   Gastrointestinal system: Abdomen is nondistended, soft and nontender. Normal bowel sounds heard. No organomegaly or masses appreciated.  Central nervous system: Alert and oriented. No focal neurological deficits.  Extremities: Symmetric 5 x 5 power. Peripheral pulses symmetrically felt.                   Skin: see above findings.  Musculoskeletal system: see above findings.  Psychiatry: Pleasant and cooperative.  Labs on Admission:  Basic Metabolic Panel:  Recent Labs Lab 04/10/17 0907  NA 135  K 3.8  CL 102  CO2 20*  GLUCOSE 141*  BUN 34*  CREATININE 1.07  CALCIUM 8.7*   Liver Function Tests:  Recent Labs Lab 04/10/17 0907  AST 18  ALT 10*  ALKPHOS 98  BILITOT 0.8  PROT 6.8  ALBUMIN 2.8*   No results for input(s): LIPASE, AMYLASE in the last 168 hours. No results for input(s): AMMONIA in the last 168 hours. CBC:  Recent Labs Lab 04/10/17 0907  WBC 10.3  NEUTROABS 9.7*  HGB 9.4*  HCT 30.6*  MCV 89.0  PLT 250   Cardiac Enzymes: No results for input(s): CKTOTAL, CKMB, CKMBINDEX, TROPONINI in the last 168 hours.  BNP (last 3 results) No results for input(s): PROBNP in the last 8760 hours. CBG: No results for input(s): GLUCAP in the last 168 hours.  Radiological Exams on Admission: Dg Pelvis 1-2 Views  Result Date: 04/10/2017 CLINICAL DATA:  Fever, pelvic pain. History of falls. Rule out  osteomyelitis. EXAM: PELVIS - 1-2 VIEW COMPARISON:  Right hip 05/14/2013 FINDINGS: Right hip replacement in satisfactory position. Negative for fracture. No lucency surrounding the prosthesis. Negative for acute pelvic fracture. Chronic fractures with vertebroplasty at L3 and L4. IMPRESSION: No acute abnormality.  Right hip replacement. Electronically Signed   By: Leonette Most  Chestine Spore M.D.   On: 04/10/2017 10:50   Dg Chest Port 1 View  Result Date: 04/10/2017 CLINICAL DATA:  Weakness. EXAM: PORTABLE CHEST 1 VIEW COMPARISON:  Radiographs of October 04, 2016. FINDINGS: Stable cardiomegaly. No pneumothorax is noted. Mild central pulmonary vascular congestion is noted. Mild bibasilar subsegmental atelectasis or infiltrates are noted. No definite pleural effusion is noted. Left midlung opacity is noted concerning for atelectasis or infiltrate. Bony thorax is unremarkable. IMPRESSION: Stable cardiomegaly with mild central pulmonary vascular congestion. Mild bibasilar subsegmental atelectasis or infiltrates are noted. Mild left perihilar atelectasis or infiltrate is noted. Followup PA and lateral chest X-ray is recommended in 3-4 weeks following trial of antibiotic therapy to ensure resolution and exclude underlying malignancy. Electronically Signed   By: Lupita Raider, M.D.   On: 04/10/2017 09:34   Dg Foot Complete Right  Result Date: 04/10/2017 CLINICAL DATA:  Fever, shortness of Breath. Open sores on right foot EXAM: RIGHT FOOT COMPLETE - 3+ VIEW COMPARISON:  11/16/2016 FINDINGS: Near complete bone destruction noted in the distal phalanx of the right great toe with only a small amount of residual bone seen on the lateral view concerning for acute osteomyelitis. Diffuse osteopenia throughout the foot. The metatarsal heads of the second through fifth digits are poorly visualized which may be projectional, but cannot exclude bone destruction. Again noted is bone destruction about the third PIP joint compatible with  osteomyelitis, progressed. IMPRESSION: Near complete bony destruction of the distal phalanx of the right great toe compatible with osteomyelitis. Continued osteomyelitis changes at the third PIP joint. Second through fifth metatarsal heads are not well visualized which may be projectional, but cannot exclude osteomyelitis. May consider further evaluation with MRI with and without contrast. Electronically Signed   By: Charlett Nose M.D.   On: 04/10/2017 10:52    EKG: Independently reviewed.   Assessment/Plan Principal Problem:   Sepsis (HCC) Active Problems:   COPD (chronic obstructive pulmonary disease) (HCC)   ESOPHAGEAL MOTILITY DISORDER   GERD   Dysphagia   Essential hypertension, benign   Chronic pain syndrome   AF (paroxysmal atrial fibrillation) (HCC)   Osteomyelitis (HCC)   DVT (deep venous thrombosis) (HCC)   Fever   Hyperglycemia   Warfarin-induced coagulopathy (HCC)   Elevated INR   Peripheral vascular disease (HCC)   Gangrene of toe of right foot (HCC)  1. Sepsis - secondary to osteomyelitis and gangrene of right foot - continue broad-spectrum IV antibiotics. Lower extremity order set initiated. Follow blood cultures. Continue supportive care. Monitor lactic acid. Definitive management is likely to be surgical. 2. Elevated INR-hold warfarin, request pharmacy assistance with warfarin dosing and management in hospital. Vitamin K 2 mg subcutaneous ordered. 3. Essential hypertension-stable, follow closely. 4. Chronic pain syndrome-patient had been treating the lower extremity pain with opioids which has not helped, definitive management is likely to be surgical. I did reorder his regular pain medication to use in the hospital. Laxative therapy ordered as well. 5. Chronic opioid-induced constipation-laxatives ordered. 6. Anemia unspecified-monitor hemoglobin closely suspect some hemodilution will cause a lower hemoglobin, Hemoccult stool. 7. Hyperglycemia-no history of diabetes  mellitus, check hemoglobin A1c as a screening test. 8. Paroxysmal atrial fibrillation he is anticoagulated with warfarin as noted above. 9. Severe peripheral vascular disease-he has been seen by Dr. Imogene Burn, Dr. Durwin Nora and had an aortogram in April 2017 that showed bilateral popliteal artery occlusions with unreconstructable tibial disease bilaterally. He had been recommended to have an amputation at that time but declined. Vascular surgery  has agreed to consult when he arrives at [facility]. I spoke with the [facility] liaison KAY who agreed to facilitate vascular consultation when patient arrives at [facility]. 10. I have called and spoken with [facility] admitter who will assume care when patient arrives at [facility].    DVT Prophylaxis: warfarin Code Status: FULL   Family Communication: bedside  Disposition Plan: TBD   Time spent: 63 mins   Standley Dakins, MD [facility] Pager [phone]  If 7PM-7AM, please contact night-coverage [email or web address] Password [email or web address] 04/10/2017, 1:18 PM

## 2017-04-10 NOTE — Progress Notes (Signed)
Pharmacy Note:  Initial antibiotics for Vancomycin and Zosyn ordered by EDP for sepsis.  Estimated Creatinine Clearance: 56.7 mL/min (by C-G formula based on SCr of 1.07 mg/dL).   Allergies  Allergen Reactions  . Sulfonamide Derivatives Hives  . Tramadol Itching    Vitals:   04/10/17 1430 04/10/17 1527  BP: 117/65 112/62  Pulse: 61 84  Resp: 18 (!) 22  Temp:    SpO2: 90% 98%    Anti-infectives    Start     Dose/Rate Route Frequency Ordered Stop   04/10/17 1800  piperacillin-tazobactam (ZOSYN) IVPB 3.375 g     3.375 g 12.5 mL/hr over 240 Minutes Intravenous Every 8 hours 04/10/17 1553     04/10/17 0945  piperacillin-tazobactam (ZOSYN) IVPB 3.375 g     3.375 g 100 mL/hr over 30 Minutes Intravenous  Once 04/10/17 0941 04/10/17 1044   04/10/17 0945  vancomycin (VANCOCIN) IVPB 1000 mg/200 mL premix     1,000 mg 200 mL/hr over 60 Minutes Intravenous  Once 04/10/17 0941 04/10/17 1114     Plan: Initial doses of Vancomycin 1gm X 1 ordered. Zosyn 3.375gm IV q8h ordered F/U admission orders for further dosing if therapy continued.  Wayland Denis, [facility] 04/10/2017 3:53 PM

## 2017-04-10 NOTE — Progress Notes (Signed)
ANTICOAGULATION CONSULT NOTE - Initial Consult  Pharmacy Consult for Warfarin (home med) Indication: VTE  Allergies  Allergen Reactions  . Sulfonamide Derivatives Hives  . Tramadol Itching    Patient Measurements: Height:  (180.3 cm) Weight: 185 lb (83.9 kg) IBW/kg (Calculated) : 75.3  Vital Signs: Temp: 101.6 F (38.7 C) (10/15 1100) Temp Source: Rectal (10/15 1100) BP: 141/68 (10/15 1736) Pulse Rate: 55 (10/15 1736)  Labs:  Recent Labs  04/10/17 0907  HGB 9.4*  HCT 30.6*  PLT 250  LABPROT 62.8*  INR 7.44*  CREATININE 1.07    Estimated Creatinine Clearance: 56.7 mL/min (by C-G formula based on SCr of 1.07 mg/dL).   Medical History: Past Medical History:  Diagnosis Date  . Achalasia   . Alcohol abuse    stop drinking since 01/2009  . Arthritis   . Asthma   . Atrial fibrillation (HCC)    off coumadin secondary to fall and subdural  hematoma   . B12 deficiency 9/10   started injection   . BPH (benign prostatic hyperplasia)   . Carcinoma in situ of bladder 2014   Dr. Annabell Howells  . Chronic kidney disease    RECENT UTI FEW MONTHS AGO TX WITH ANTIBIOTIC  . Compression fracture   . COPD (chronic obstructive pulmonary disease) (HCC)   . Diverticula of colon    few small scattered in the sigmoid colon  . Enlarged prostate   . External hemorrhoids 09/2014  . GERD (gastroesophageal reflux disease)   . H/O hiatal hernia   . Headache(784.0)   . Hypertension   . Nutcracker esophagus 2002   on EM  . Osteoporosis   . Peripheral edema   . Reflux   . Venous stasis    chronic     Medications:   (Not in a hospital admission)  Assessment: INR > 7 on admission.  Phytonadione given per MD.  HOLD coumadin today.    Goal of Therapy:  INR 2-3 Monitor platelets by anticoagulation protocol: Yes   Plan:  NO coumadin today INR daily  Valrie Hart A 04/10/2017,6:53 PM

## 2017-04-10 NOTE — ED Notes (Signed)
Fentanyl pulled from pyxis to administer to pt. Pt in radiology at time RN went to give medication. This RN reported off to Evlyn Courier at 1048 and Fentanyl was given to her to administer to pt.

## 2017-04-10 NOTE — ED Notes (Signed)
Upon RN assessment, multiple wounds noted to right foot. Pt's right great toe is black and draining red fluid. Pt's other wounds to right foot have a yellow colored base and draining yellow fluid. Pt's bilateral lower legs are also scaly and draining yellow fluid. Pt's bottom is red with black scabs present and yellow drainage.

## 2017-04-10 NOTE — ED Notes (Signed)
Admitting MD Laural Benes notified that we are holding pt in ED d/t no beds at [facility] at this time. No new orders or change of plans.

## 2017-04-10 NOTE — ED Triage Notes (Signed)
EMS reports pt c/o fever, sob, and generalized weakness.  Reports was on 4liters of 02 with ems and sat was 99-100%.  Pt says symptoms have been present for approx 1 month.  Reports has chronic back pain and takes oxycodone.  Has taken one oxycodone this morning.

## 2017-04-10 NOTE — Progress Notes (Signed)
RN notified [facility] of patient's arrival from [facility].

## 2017-04-10 NOTE — ED Notes (Addendum)
CRITICAL VALUE ALERT  Critical Value:  INR 7.44  Date & Time Notied:  04/10/17 1029  Provider Notified: Dr. Clayborne Dana  Orders Received/Actions taken: EDP notified, no further orders given

## 2017-04-10 NOTE — ED Notes (Signed)
Report given to [facility] at this time.   

## 2017-04-10 NOTE — ED Notes (Signed)
Awaiting xylocaine jelly from pharmacy.

## 2017-04-11 ENCOUNTER — Ambulatory Visit: Payer: Medicare Other

## 2017-04-11 DIAGNOSIS — A419 Sepsis, unspecified organism: Secondary | ICD-10-CM

## 2017-04-11 DIAGNOSIS — R791 Abnormal coagulation profile: Secondary | ICD-10-CM

## 2017-04-11 LAB — COMPREHENSIVE METABOLIC PANEL
ALBUMIN: 2 g/dL — AB (ref 3.5–5.0)
ALT: 13 U/L — AB (ref 17–63)
ANION GAP: 9 (ref 5–15)
AST: 37 U/L (ref 15–41)
Alkaline Phosphatase: 74 U/L (ref 38–126)
BUN: 24 mg/dL — AB (ref 6–20)
CO2: 19 mmol/L — AB (ref 22–32)
Calcium: 8.1 mg/dL — ABNORMAL LOW (ref 8.9–10.3)
Chloride: 107 mmol/L (ref 101–111)
Creatinine, Ser: 0.94 mg/dL (ref 0.61–1.24)
GFR calc Af Amer: >60 mL/min (ref >60)
GFR calc non Af Amer: >60 mL/min (ref >60)
GLUCOSE: 126 mg/dL — AB (ref 65–99)
POTASSIUM: 4.1 mmol/L (ref 3.5–5.1)
SODIUM: 135 mmol/L (ref 135–145)
Total Bilirubin: 0.9 mg/dL (ref 0.3–1.2)
Total Protein: 5.3 g/dL — ABNORMAL LOW (ref 6.5–8.1)

## 2017-04-11 LAB — CBC WITH DIFFERENTIAL/PLATELET
BASOS ABS: 0 10*3/uL (ref 0.0–0.1)
Basophils Relative: 0 %
EOS PCT: 0 %
Eosinophils Absolute: 0 10*3/uL (ref 0.0–0.7)
HCT: 27 % — ABNORMAL LOW (ref 39.0–52.0)
Hemoglobin: 8.5 g/dL — ABNORMAL LOW (ref 13.0–17.0)
LYMPHS PCT: 5 %
Lymphs Abs: 0.3 10*3/uL — ABNORMAL LOW (ref 0.7–4.0)
MCH: 27.4 pg (ref 26.0–34.0)
MCHC: 31.5 g/dL (ref 30.0–36.0)
MCV: 87.1 fL (ref 78.0–100.0)
MONO ABS: 0.3 10*3/uL (ref 0.1–1.0)
Monocytes Relative: 5 %
NEUTROS ABS: 5.4 10*3/uL (ref 1.7–7.7)
Neutrophils Relative %: 90 %
PLATELETS: 153 10*3/uL (ref 150–400)
RBC: 3.1 MIL/uL — AB (ref 4.22–5.81)
RDW: 18.1 % — AB (ref 11.5–15.5)
WBC: 6.1 10*3/uL (ref 4.0–10.5)

## 2017-04-11 LAB — BLOOD CULTURE ID PANEL (REFLEXED)
Acinetobacter baumannii: NOT DETECTED
CANDIDA GLABRATA: NOT DETECTED
CANDIDA KRUSEI: NOT DETECTED
CANDIDA PARAPSILOSIS: NOT DETECTED
CARBAPENEM RESISTANCE: NOT DETECTED
Candida albicans: NOT DETECTED
Candida tropicalis: NOT DETECTED
ENTEROBACTERIACEAE SPECIES: DETECTED — AB
ESCHERICHIA COLI: NOT DETECTED
Enterobacter cloacae complex: NOT DETECTED
Enterococcus species: NOT DETECTED
Haemophilus influenzae: NOT DETECTED
KLEBSIELLA OXYTOCA: NOT DETECTED
KLEBSIELLA PNEUMONIAE: NOT DETECTED
Listeria monocytogenes: NOT DETECTED
Methicillin resistance: DETECTED — AB
Neisseria meningitidis: NOT DETECTED
Proteus species: DETECTED — AB
Pseudomonas aeruginosa: NOT DETECTED
SERRATIA MARCESCENS: NOT DETECTED
STAPHYLOCOCCUS AUREUS BCID: DETECTED — AB
STAPHYLOCOCCUS SPECIES: DETECTED — AB
STREPTOCOCCUS AGALACTIAE: NOT DETECTED
STREPTOCOCCUS PNEUMONIAE: NOT DETECTED
Streptococcus pyogenes: NOT DETECTED
Streptococcus species: NOT DETECTED

## 2017-04-11 LAB — HIV ANTIBODY (ROUTINE TESTING W REFLEX): HIV SCREEN 4TH GENERATION: NONREACTIVE

## 2017-04-11 LAB — PROTIME-INR
INR: 2.17
PROTHROMBIN TIME: 24 s — AB (ref 11.4–15.2)

## 2017-04-11 MED ORDER — IBUPROFEN 200 MG PO TABS
400.0000 mg | ORAL_TABLET | Freq: Once | ORAL | Status: AC
  Administered 2017-04-11: 400 mg via ORAL
  Filled 2017-04-11: qty 2

## 2017-04-11 MED ORDER — ENSURE ENLIVE PO LIQD
237.0000 mL | Freq: Two times a day (BID) | ORAL | Status: DC
Start: 2017-04-11 — End: 2017-04-20
  Administered 2017-04-11 – 2017-04-20 (×10): 237 mL via ORAL

## 2017-04-11 MED ORDER — PHYTONADIONE 1 MG/0.5 ML ORAL SOLUTION
1.0000 mg | Freq: Once | ORAL | Status: AC
  Administered 2017-04-11: 1 mg via ORAL
  Filled 2017-04-11: qty 0.5

## 2017-04-11 MED ORDER — SODIUM CHLORIDE 0.9 % IV BOLUS (SEPSIS)
500.0000 mL | Freq: Once | INTRAVENOUS | Status: AC
  Administered 2017-04-11: 500 mL via INTRAVENOUS

## 2017-04-11 MED ORDER — MORPHINE SULFATE (PF) 4 MG/ML IV SOLN
1.0000 mg | INTRAVENOUS | Status: DC | PRN
  Administered 2017-04-11 (×2): 1 mg via INTRAVENOUS
  Administered 2017-04-13 – 2017-04-15 (×2): 2 mg via INTRAVENOUS
  Filled 2017-04-11 (×5): qty 1

## 2017-04-11 NOTE — Progress Notes (Signed)
ANTICOAGULATION CONSULT NOTE - Follow Up Consult  Pharmacy Consult for Coumadin Indication: atrial fibrillation  Allergies  Allergen Reactions  . Sulfonamide Derivatives Hives  . Tramadol Itching    Patient Measurements: Height:  (180.3 cm) Weight: 185 lb (83.9 kg) IBW/kg (Calculated) : 75.3  Vital Signs: Temp: 99.5 F (37.5 C) (10/16 0545) Temp Source: Oral (10/16 0545) BP: 114/54 (10/16 0545) Pulse Rate: 64 (10/16 0545)  Labs:  Recent Labs  04/10/17 0907 04/11/17 1118  HGB 9.4* 8.5*  HCT 30.6* 27.0*  PLT 250 153  LABPROT 62.8* 24.0*  INR 7.44* 2.17  CREATININE 1.07 0.94    Estimated Creatinine Clearance: 64.5 mL/min (by C-G formula based on SCr of 0.94 mg/dL).   Assessment:  Anticoag: Warfarin PTA for h/o VTE, afib. INR 7.44 (10/15) s/p Vit K  SQ x 1 on 10/15. Hgb 9.4. INR down to 2.17 today. Hgb down to 8.5  Goal of Therapy:  INR 2-3 Monitor platelets by anticoagulation protocol: Yes   Plan:  R BKA or AKA on Thursday. Coumadin per pharmacy BUT needs to be held with amputation pending Thursday. Paging MD for orders for bridging   Meridith Romick S. Merilynn Finland, PharmD, BCPS Clinical Staff Pharmacist Pager [phone]  Misty Stanley Stillinger 04/11/2017,1:10 PM

## 2017-04-11 NOTE — Progress Notes (Signed)
Initial Nutrition Assessment  DOCUMENTATION CODES:   Not applicable  INTERVENTION:  Provide Ensure Enlive po BID, each supplement provides 350 kcal and 20 grams of protein.  Encourage adequate PO intake.   NUTRITION DIAGNOSIS:   Increased nutrient needs related to wound healing as evidenced by estimated needs.  GOAL:   Patient will meet greater than or equal to 90% of their needs  MONITOR:   PO intake, Supplement acceptance, Labs, Weight trends, Skin, I & O's  REASON FOR ASSESSMENT:   Consult Wound healing  ASSESSMENT:   83 y.o. male with COPD, severe peripheral vascular disease who has been dealing with ischemic lower extremities for nearly 2 years. Pt with Sepsis secondary to osteomyelitis and gangrene of right foot   Plan for amputation 10/18 for gangrene of R foot. Diet advanced this AM. Pt has not received his lunch tray yet during time of visit earlier. Pt however reports no appetite which has been ongoing over the past 3 days. Pt reports his last meal eaten was 2 days PTA. Pt reports prior to acute illness, he was eating well. RD to order nutritional supplements to aid in wound healing.   Pt with no observed significant fat or muscle mass loss.   Labs and medications reviewed.   Diet Order:  Diet Heart Room service appropriate? Yes; Fluid consistency: Thin  Skin:  Wound (see comment) (gangrene of right foot )  Last BM:  PTA  Height:   Ht Readings from Last 1 Encounters:  04/10/17  (1.803 m)    Weight:   Wt Readings from Last 1 Encounters:  04/10/17 185 lb (83.9 kg)    Ideal Body Weight:  78 kg  BMI:  Body mass index is 25.8 kg/m.  Estimated Nutritional Needs:   Kcal:  2000-2250   Protein:  100-110 grams  Fluid:  2-2.2 L/day  EDUCATION NEEDS:   No education needs identified at this time  Roslyn Smiling, MS, RD, LDN Pager # [phone] After hours/ weekend pager # [phone]

## 2017-04-11 NOTE — Progress Notes (Signed)
CRITICAL VALUE ALERT  Critical Value:  Anaerobic gram positive cocci   Date & Time Notied: 04/11/2017 @ 0039  Provider Notified: [facility]  Orders Received/Actions taken: Pending

## 2017-04-11 NOTE — Progress Notes (Signed)
D/w dr Algie Coffer who will eval for TEE feasibility

## 2017-04-11 NOTE — Progress Notes (Signed)
RN informed [facility] of patient's elevated temperature.

## 2017-04-11 NOTE — Progress Notes (Signed)
[facility]  [facility] Infusion Coordinator will follow pt with ID team to support IV ABX if needed at DC.  If patient discharges after hours, please call [phone].   [name] [name] 04/11/2017, 3:26 PM

## 2017-04-11 NOTE — Progress Notes (Signed)
PHARMACY - PHYSICIAN COMMUNICATION CRITICAL VALUE ALERT - BLOOD CULTURE IDENTIFICATION (BCID)  Results for orders placed or performed during the hospital encounter of 04/10/17  Blood Culture ID Panel (Reflexed) (Collected: 04/10/2017 10:00 AM)  Result Value Ref Range   Enterococcus species NOT DETECTED NOT DETECTED   Listeria monocytogenes NOT DETECTED NOT DETECTED   Staphylococcus species DETECTED (A) NOT DETECTED   Staphylococcus aureus DETECTED (A) NOT DETECTED   Methicillin resistance DETECTED (A) NOT DETECTED   Streptococcus species NOT DETECTED NOT DETECTED   Streptococcus agalactiae NOT DETECTED NOT DETECTED   Streptococcus pneumoniae NOT DETECTED NOT DETECTED   Streptococcus pyogenes NOT DETECTED NOT DETECTED   Acinetobacter baumannii NOT DETECTED NOT DETECTED   Enterobacteriaceae species DETECTED (A) NOT DETECTED   Enterobacter cloacae complex NOT DETECTED NOT DETECTED   Escherichia coli NOT DETECTED NOT DETECTED   Klebsiella oxytoca NOT DETECTED NOT DETECTED   Klebsiella pneumoniae NOT DETECTED NOT DETECTED   Proteus species DETECTED (A) NOT DETECTED   Serratia marcescens NOT DETECTED NOT DETECTED   Carbapenem resistance NOT DETECTED NOT DETECTED   Haemophilus influenzae NOT DETECTED NOT DETECTED   Neisseria meningitidis NOT DETECTED NOT DETECTED   Pseudomonas aeruginosa NOT DETECTED NOT DETECTED   Candida albicans NOT DETECTED NOT DETECTED   Candida glabrata NOT DETECTED NOT DETECTED   Candida krusei NOT DETECTED NOT DETECTED   Candida parapsilosis NOT DETECTED NOT DETECTED   Candida tropicalis NOT DETECTED NOT DETECTED    Name of physician (or Provider) Contacted: N/A  Changes to prescribed antibiotics required:   4/4 culture bottles growing MRSA 1/4 culture bottles growing Proteus sp.  Currently receiving Vancomycin Cefepime and Flagyl Recommend F/U ID consult (automatic when MRSA isolated)  Brenden Rudman, Gary Fleet 04/11/2017  7:42 AM

## 2017-04-11 NOTE — Progress Notes (Signed)
RN changed pt's dressing per verbal order from NP.

## 2017-04-11 NOTE — Consult Note (Signed)
[facility] [place]  Patient name: [name] [name] MRN: [id number] DOB: 02-11-1933 Sex: male    HPI: [name] [name] is a 83 y.o. male with a known history of severe peripheral vascular occlusive disease.  He initially was seen by Dr. Imogene Burn in our office April 2017.  He underwent arteriogram for superficial ulcerations and difficulty healing and pain in his lower extremities.  This revealed normal flow to the level of the popliteals and occlusion of all tibial vessels.  He was felt to be non-reconstructable.  He was seen again in our office by Dr. Edilia Bo on December 2017.  He again was having some ulceration and pain and was offered right below-knee amputation but refused.  He is continued to have severe chronic pain requiring narcotics.  He presented earlier this week to outlying hospital with progressive gangrenous changes of his right foot.  He underwent a debridement of this by general surgery is at the outlying hospital.  He continued to have evidence of progressive infection and was transferred last night to [facility].  Past Medical History:  Diagnosis Date  . Achalasia   . Alcohol abuse    stop drinking since 01/2009  . Arthritis   . Asthma   . Atrial fibrillation (HCC)    off coumadin secondary to fall and subdural  hematoma   . B12 deficiency 9/10   started injection   . BPH (benign prostatic hyperplasia)   . Carcinoma in situ of bladder 2014   Dr. Annabell Howells  . Chronic kidney disease    RECENT UTI FEW MONTHS AGO TX WITH ANTIBIOTIC  . Compression fracture   . COPD (chronic obstructive pulmonary disease) (HCC)   . Diverticula of colon    few small scattered in the sigmoid colon  . Enlarged prostate   . External hemorrhoids 09/2014  . GERD (gastroesophageal reflux disease)   . H/O hiatal hernia   . Headache(784.0)   . Hypertension   . Nutcracker esophagus 2002   on EM  . Osteoporosis   . Peripheral edema   . Reflux     . Venous stasis    chronic     Family History  Problem Relation Age of Onset  . Cancer Brother   . Hypertension Mother   . Diabetes Mother   . Heart attack Mother   . Heart disease Mother   . Colon cancer Neg Hx   . Gastric cancer Neg Hx   . Esophageal cancer Neg Hx     SOCIAL HISTORY: Social History  Substance Use Topics  . Smoking status: Never Smoker  . Smokeless tobacco: Former Neurosurgeon    Types: Chew    Quit date: 06/28/1991  . Alcohol use No     Comment: history of alcohol abuse stop drinking 8/10 . Alcohol abuse for about 25 years     Allergies  Allergen Reactions  . Sulfonamide Derivatives Hives  . Tramadol Itching    Current Facility-Administered Medications  Medication Dose Route Frequency Provider Last Rate Last Dose  . 0.9 %  sodium chloride infusion   Intravenous Continuous Johnson, Clanford L, MD 75 mL/hr at 04/10/17 1413    . acetaminophen (TYLENOL) tablet 650 mg  650 mg Oral Q6H PRN Johnson, Clanford L, MD       Or  . acetaminophen (TYLENOL) suppository 650 mg  650 mg Rectal Q6H PRN Johnson, Clanford L, MD      . ceFEPIme (MAXIPIME) 1 g in dextrose 5 %  50 mL IVPB  1 g Intravenous Q12H Johnson, Clanford L, MD      . escitalopram (LEXAPRO) tablet 10 mg  10 mg Oral Daily Johnson, Clanford L, MD   10 mg at 04/10/17 1838  . metroNIDAZOLE (FLAGYL) IVPB 500 mg  500 mg Intravenous Q8H Johnson, Clanford L, MD   Stopped at 04/11/17 0110  . ondansetron (ZOFRAN) tablet 4 mg  4 mg Oral Q6H PRN Johnson, Clanford L, MD       Or  . ondansetron (ZOFRAN) injection 4 mg  4 mg Intravenous Q6H PRN Johnson, Clanford L, MD      . oxyCODONE-acetaminophen (PERCOCET/ROXICET) 5-325 MG per tablet 1 tablet  1 tablet Oral Q6H PRN Laural Benes, Clanford L, MD   1 tablet at 04/10/17 1803   And  . oxyCODONE (Oxy IR/ROXICODONE) immediate release tablet 5 mg  5 mg Oral Q6H PRN Laural Benes, Clanford L, MD   5 mg at 04/11/17 0438  . polyethylene glycol (MIRALAX / GLYCOLAX) packet 17 g  17 g Oral BID  Johnson, Clanford L, MD      . potassium chloride SA (K-DUR,KLOR-CON) CR tablet 10 mEq  10 mEq Oral Daily Johnson, Clanford L, MD   10 mEq at 04/10/17 1804  . senna (SENOKOT) tablet 8.6 mg  1 tablet Oral QHS Johnson, Clanford L, MD   8.6 mg at 04/11/17 0009  . vancomycin (VANCOCIN) IVPB 1000 mg/200 mL premix  1,000 mg Intravenous Q12H Johnson, Clanford L, MD   Stopped at 04/10/17 2215  . verapamil (CALAN-SR) CR tablet 120 mg  120 mg Oral QHS Johnson, Clanford L, MD   120 mg at 04/11/17 0009    REVIEW OF SYSTEMS:   denotes positive finding,  denotes negative finding Cardiac  Comments:  Chest pain or chest pressure:    Shortness of breath upon exertion:    Short of breath when lying flat:    Irregular heart rhythm:        Vascular    Pain in calf, thigh, or hip brought on by ambulation: x   Pain in feet at night that wakes you up from your sleep:  x   Blood clot in your veins:    Leg swelling:           PHYSICAL EXAM: Vitals:   04/10/17 1900 04/10/17 2211 04/11/17 0014 04/11/17 0545  BP: 127/78 (!) 142/52  (!) 114/54  Pulse: 98 96  64  Resp: Temp:  (!) 102.4 F (39.1 C) (!) 102.6 F (39.2 C) 99.5 F (37.5 C)  TempSrc:  Axillary Axillary Oral  SpO2: 92% 90%  90%  Weight:      Height:        GENERAL: The patient is a well-nourished male, in no acute distress. The vital signs are documented above. CARDIOVASCULAR: Plus radial 2+ femoral and 2+ popliteal pulses bilaterally.  Absent pedal pulses bilaterally. PULMONARY: There is good air exchange  MUSCULOSKELETAL: There are no major deformities or cyanosis. NEUROLOGIC: No focal weakness or paresthesias are detected. SKIN: Superficial abrasions over both lower extremity's from his knees distally.  These do not appear to be full-thickness.  He does have infection involving his metatarsal head on his right great toe at the base of the amputation site.  This is tender and foul-smelling.  On the left leg he has  ulceration over the second toe. PSYCHIATRIC: The patient has a normal affect.  DATA:  Reviewed his actual arteriogram films and again  discussed this with the patient  MEDICAL ISSUES: Discussed this with the patient.  He is cognizant and remembered that he was been told in the past that he had unreconstructable disease with small vessel tibial disease.  Explained his only option is amputation.  I also explained that he is at high risk for eventual left leg amputation but currently is able to tolerate this level of discomfort.  He has been made n.p.o. and we will attempt to get him on the operative schedule today for right below-knee amputation    Larina Earthly, MD [facility] Tel [phone] Pager [phone]

## 2017-04-11 NOTE — Progress Notes (Signed)
PROGRESS NOTE    [name] [name]  ZOX:[id number] DOB: [birth year] DOA: 04/10/2017 PCP: Merlyn Albert, MD   Specialists:     Brief Narrative:  2 wm Severe peripheral vascular disease  09/2015-aortogram = bilateral popliteal occlusions without reconstructable tibial disease-recommended BKA  Known chronic osteomyelitis--treated 11/16/16 admission Chronic pain Anemia, normocytic? Chronic component Chronic dysphagia-EGD 10/01/15-pill-induced-lost to follow-up and did not go to [facility] for manometry DVT complicated by prior subdural Chronic A. Fib [place] score >4 Bipolar COPD not on home oxygen Previous lumbar fractures L3, L4 in 2013 status post kyphoplasty Pulmonary hypertension based on cardiac cath10/2018  Represented to [facility] emergency room 10/15 worsening lower extremity wounds Admitting physician transfer patient to [facility] for consideration of operative management under vascular surgery who have been consulted     Assessment & Plan:   Principal Problem:   Sepsis (HCC) Active Problems:   COPD (chronic obstructive pulmonary disease) (HCC)   ESOPHAGEAL MOTILITY DISORDER   GERD   Dysphagia   Essential hypertension, benign   Chronic pain syndrome   AF (paroxysmal atrial fibrillation) (HCC)   Osteomyelitis (HCC)   DVT (deep venous thrombosis) (HCC)   Fever   Hyperglycemia   Warfarin-induced coagulopathy (HCC)   Elevated INR   Peripheral vascular disease (HCC)   Gangrene of toe of right foot (HCC)   Gangrene  Vascular surgerywill discussed later with family the plan-going for BKA Today and understandswill probably need surgery  Pain control and perioperative wound management  Surgery  Continue IV saline is nothing by  75 cc/H  Antibiotics vancomycin cefepime and  Flagyl until surgery--De-escalate as pemargins  Atrial fibrillation [place] score >4 DVT  supratherapeutic INR of 7  Repeating INR stat--given vitamin K 2 mg Subcutaneous on 10/15  if needed will  give FFP later on today  Not currently on any rate controlling agent would keep on telemetry  HTN Pulmonary hypertension  At this time holding Lasix 80 daily, can continue verapamil 120 daily at bedtime  COPD not on home O2 Probable Buerger's disease Secondary to smoking  No current wheeze, when necessary albuterol inhalers--doesn't seem to be on any at home  Chronic reflux and dysphagia  Continue MiraLAX and senna  Watch for dysphagia clinically   Discussed with son at bedside-explaining planning regarding surgery is deferred to vascular surgery  Full code Inpatient Pending procedure and consult  Consultants:   Vascular surgery  Procedures:   None yet  Antimicrobials:   ultiple    Subjective:  Awake alert dry mouth wishes to drink no nausea no vomiting Pain is 5/10-6/10 at present but at times is 10 on 10 Wounds not examined however noted to be febrile  Objective: Vitals:   04/10/17 1900 04/10/17 2211 04/11/17 0014 04/11/17 0545  BP: 127/78 (!) 142/52  (!) 114/54  Pulse: 98 96  64  Resp: Temp:  (!) 102.4 F (39.1 C) (!) 102.6 F (39.2 C) 99.5 F (37.5 C)  TempSrc:  Axillary Axillary Oral  SpO2: 92% 90%  90%  Weight:      Height:        Intake/Output Summary (Last 24 hours) at 04/11/17 0757 Last data filed at 04/11/17 0406  Gross per 24 hour  Intake          5306.25 ml  Output                0 ml  Net  5306.25 ml   Filed Weights   04/10/17 0904 04/10/17 0933  Weight: 86.2 kg (190 lb) 83.9 kg (185 lb)    Examination:  Alert sleepy dry mucosa nonicteric Pectus carinatum S1-S2 no murmur Abdomen soft nontender Is not examined however multiple bandages--- please see documentation and pictures from prior   Data Reviewed: I have personally reviewed following labs and imaging studies  CBC:  Recent Labs Lab 04/10/17 0907  WBC 10.3  NEUTROABS 9.7*  HGB 9.4*  HCT 30.6*  MCV 89.0  PLT 250   Basic Metabolic  Panel:  Recent Labs Lab 04/10/17 0907  NA 135  K 3.8  CL 102  CO2 20*  GLUCOSE 141*  BUN 34*  CREATININE 1.07  CALCIUM 8.7*   GFR: Estimated Creatinine Clearance: 56.7 mL/min (by C-G formula based on SCr of 1.07 mg/dL). Liver Function Tests:  Recent Labs Lab 04/10/17 0907  AST 18  ALT 10*  ALKPHOS 98  BILITOT 0.8  PROT 6.8  ALBUMIN 2.8*   No results for input(s): LIPASE, AMYLASE in the last 168 hours. No results for input(s): AMMONIA in the last 168 hours. Coagulation Profile:  Recent Labs Lab 04/10/17 0907  INR 7.44*   Cardiac Enzymes: No results for input(s): CKTOTAL, CKMB, CKMBINDEX, TROPONINI in the last 168 hours. BNP (last 3 results) No results for input(s): PROBNP in the last 8760 hours. HbA1C:  Recent Labs  04/10/17 1812  HGBA1C 5.8*   CBG: No results for input(s): GLUCAP in the last 168 hours. Lipid Profile: No results for input(s): CHOL, HDL, LDLCALC, TRIG, CHOLHDL, LDLDIRECT in the last 72 hours. Thyroid Function Tests: No results for input(s): TSH, T4TOTAL, FREET4, T3FREE, THYROIDAB in the last 72 hours. Anemia Panel: No results for input(s): VITAMINB12, FOLATE, FERRITIN, TIBC, IRON, RETICCTPCT in the last 72 hours. Urine analysis:    Component Value Date/Time   COLORURINE YELLOW 04/10/2017 0907   APPEARANCEUR HAZY (A) 04/10/2017 0907   LABSPEC 1.028 04/10/2017 0907   PHURINE 5.0 04/10/2017 0907   GLUCOSEU NEGATIVE 04/10/2017 0907   HGBUR MODERATE (A) 04/10/2017 0907   BILIRUBINUR NEGATIVE 04/10/2017 0907   KETONESUR 5 (A) 04/10/2017 0907   PROTEINUR 30 (A) 04/10/2017 0907   UROBILINOGEN 0.2 02/21/2011 1842   NITRITE NEGATIVE 04/10/2017 0907   LEUKOCYTESUR NEGATIVE 04/10/2017 1478     Radiology Studies: Reviewed images personally in health database    Scheduled Meds: . escitalopram  10 mg Oral Daily  . polyethylene glycol  17 g Oral BID  . potassium chloride  10 mEq Oral Daily  . senna  1 tablet Oral QHS  . verapamil  120  mg Oral QHS   Continuous Infusions: . sodium chloride 75 mL/hr at 04/10/17 1413  . ceFEPime (MAXIPIME) IV    . metronidazole Stopped (04/11/17 0110)  . vancomycin Stopped (04/10/17 2215)     LOS: 1 day    Time spent: warty 5    Pleas Koch, MD [facility]   If 7PM-7AM, please contact night-coverage [email or web address] Password [email or web address] 04/11/2017, 7:57 AM

## 2017-04-11 NOTE — Progress Notes (Signed)
CRITICAL VALUE ALERT  Critical Value:  Aerobic gram positive cocci  Date & Time Notied:  04/11/2017 @ 0039  Provider Notified: TRH  Orders Received/Actions taken: Pending

## 2017-04-11 NOTE — Consult Note (Addendum)
[facility] for [facility]ious Disease  Total days of antibiotics 1              Reason for Consult: MRSA bacteremia    Referring Physician: samtani  Principal Problem:   Sepsis ([place]) Active Problems:   COPD (chronic obstructive pulmonary disease) ([place])   ESOPHAGEAL MOTILITY DISORDER   GERD   Dysphagia   Essential hypertension, benign   Chronic pain syndrome   AF (paroxysmal atrial fibrillation) (HCC)   Osteomyelitis (HCC)   DVT (deep venous thrombosis) (HCC)   Fever   Hyperglycemia   Warfarin-induced coagulopathy (HCC)   Elevated INR   Peripheral vascular disease (HCC)   Gangrene of toe of right foot (HCC)    HPI: [name] [name] is a 83 y.o. male with long standing PVD,  Who has ongoing right foot osteo previously treated this past spring now worsening with gangrenous appearance. He has significant vascular disease unable to have revascularization. He was seen by Dr Scot Dock today who recommends right BKA for management of wound. Initially patient reports having fever, chills, rigors for 1 week. On admit, WBC was elevated just above normal range, but fevers of 105F. His foot has significant malodor with signs of necrosis. He was started on broad spectrum abtx with vanco, cefepime and metronidazole. Patient now is afebrile @ 24hr of his stay. The patient's blood cx from admit are showing MRSA plus proteus bacteremia. I have reviewed imaging of his foot appears to have multiple digit involvement  Past Medical History:  Diagnosis Date  . Achalasia   . Alcohol abuse    stop drinking since 01/2009  . Arthritis   . Asthma   . Atrial fibrillation (HCC)    off coumadin secondary to fall and subdural  hematoma   . B12 deficiency 9/10   started injection   . BPH (benign prostatic hyperplasia)   . Carcinoma in situ of bladder 2014   Dr. Jeffie Pollock  . Chronic kidney disease    RECENT UTI FEW MONTHS AGO TX WITH ANTIBIOTIC  . Compression fracture   . COPD (chronic obstructive pulmonary  disease) ([place])   . Diverticula of colon    few small scattered in the sigmoid colon  . Enlarged prostate   . External hemorrhoids 09/2014  . GERD (gastroesophageal reflux disease)   . H/O hiatal hernia   . Headache(784.0)   . Hypertension   . Nutcracker esophagus 2002   on EM  . Osteoporosis   . Peripheral edema   . Reflux   . Venous stasis    chronic     Allergies:  Allergies  Allergen Reactions  . Sulfonamide Derivatives Hives  . Tramadol Itching    MEDICATIONS: . escitalopram  10 mg Oral Daily  . verapamil  120 mg Oral QHS    Social History  Substance Use Topics  . Smoking status: Never Smoker  . Smokeless tobacco: Former Systems developer    Types: Chew    Quit date: 06/28/1991  . Alcohol use No     Comment: history of alcohol abuse stop drinking 8/10 . Alcohol abuse for about 25 years     Family History  Problem Relation Age of Onset  . Cancer Brother   . Hypertension Mother   . Diabetes Mother   . Heart attack Mother   . Heart disease Mother   . Colon cancer Neg Hx   . Gastric cancer Neg Hx   . Esophageal cancer Neg Hx     Review  of Systems  Constitutional: positive for fever, chills, diaphoresis, activity change, appetite change, fatigue and unexpected weight change.  HENT: Negative for congestion, sore throat, rhinorrhea, sneezing, trouble swallowing and sinus pressure.  Eyes: Negative for photophobia and visual disturbance.  Respiratory: Negative for cough, chest tightness, shortness of breath, wheezing and stridor.  Cardiovascular: Negative for chest pain, palpitations and leg swelling.  Gastrointestinal: Negative for nausea, vomiting, abdominal pain, diarrhea, constipation, blood in stool, abdominal distention and anal bleeding.  Genitourinary: Negative for dysuria, hematuria, flank pain and difficulty urinating.  Musculoskeletal: Negative for myalgias, back pain, joint swelling, arthralgias and gait problem.  Skin: + increasing drainage to right great toe and  dorsum of fooot Neurological: Negative for dizziness, tremors, weakness and light-headedness.  Hematological: Negative for adenopathy. Does not bruise/bleed easily.  Psychiatric/Behavioral: Negative for behavioral problems, confusion, sleep disturbance, dysphoric mood, decreased concentration and agitation.     OBJECTIVE: Temp:  [99.5 F (37.5 C)-102.6 F (39.2 C)] 99.5 F (37.5 C) (10/16 0545) Pulse Rate:  [55-98] 64 (10/16 0545) Resp:  [15-24] 15 (10/16 0545) BP: (95-156)/(49-78) 114/54 (10/16 0545) SpO2:  [90 %-98 %] 90 % (10/16 0545) Physical Exam  Constitutional: He is oriented to person, place, and time. He appears well-developed and well-nourished. No distress.  HENT:  Mouth/Throat: Oropharynx is clear and moist. No oropharyngeal exudate.  Cardiovascular: Normal rate, regular rhythm and normal heart sounds. Exam reveals no gallop and no friction rub. Soft murmur heard on lateral/base Pulmonary/Chest: Effort normal and breath sounds normal. No respiratory distress. He has no wheezes.  Abdominal: Soft. Bowel sounds are normal. He exhibits no distension. There is no tenderness.  Lymphadenopathy:  He has no cervical adenopathy.  Neurological: He is alert and oriented to person, place, and time.  Skin: Scattered ulcers to anterior tibia, but also right great toe has foul smell, necortic appearance with macerated edge, tender to touch, warm, DP pulse difficult to feel. Psychiatric: He has a normal mood and affect. His behavior is normal.     LABS: Results for orders placed or performed during the hospital encounter of 04/10/17 (from the past 48 hour(s))  Comprehensive metabolic panel     Status: Abnormal   Collection Time: 04/10/17  9:07 AM  Result Value Ref Range   Sodium 135 135 - 145 mmol/L   Potassium 3.8 3.5 - 5.1 mmol/L   Chloride 102 101 - 111 mmol/L   CO2 20 (L) 22 - 32 mmol/L   Glucose, Bld 141 (H) 65 - 99 mg/dL   BUN 34 (H) 6 - 20 mg/dL   Creatinine, Ser 1.07 0.61  - 1.24 mg/dL   Calcium 8.7 (L) 8.9 - 10.3 mg/dL   Total Protein 6.8 6.5 - 8.1 g/dL   Albumin 2.8 (L) 3.5 - 5.0 g/dL   AST 18 15 - 41 U/L   ALT 10 (L) 17 - 63 U/L   Alkaline Phosphatase 98 38 - 126 U/L   Total Bilirubin 0.8 0.3 - 1.2 mg/dL   GFR calc non Af Amer >60 >60 mL/min   GFR calc Af Amer >60 >60 mL/min    Comment: (NOTE) The eGFR has been calculated using the CKD EPI equation. This calculation has not been validated in all clinical situations. eGFR's persistently <60 mL/min signify possible Chronic Kidney Disease.    Anion gap 13 5 - 15  CBC with Differential     Status: Abnormal   Collection Time: 04/10/17  9:07 AM  Result Value Ref Range   WBC 10.3  4.0 - 10.5 K/uL   RBC 3.44 (L) 4.22 - 5.81 MIL/uL   Hemoglobin 9.4 (L) 13.0 - 17.0 g/dL   HCT 30.6 (L) 39.0 - 52.0 %   MCV 89.0 78.0 - 100.0 fL   MCH 27.3 26.0 - 34.0 pg   MCHC 30.7 30.0 - 36.0 g/dL   RDW 16.9 (H) 11.5 - 15.5 %   Platelets 250 150 - 400 K/uL   Neutrophils Relative % 95 %   Neutro Abs 9.7 (H) 1.7 - 7.7 K/uL   Lymphocytes Relative 2 %   Lymphs Abs 0.2 (L) 0.7 - 4.0 K/uL   Monocytes Relative 3 %   Monocytes Absolute 0.3 0.1 - 1.0 K/uL   Eosinophils Relative 0 %   Eosinophils Absolute 0.0 0.0 - 0.7 K/uL   Basophils Relative 0 %   Basophils Absolute 0.0 0.0 - 0.1 K/uL  Protime-INR     Status: Abnormal   Collection Time: 04/10/17  9:07 AM  Result Value Ref Range   Prothrombin Time 62.8 (H) 11.4 - 15.2 seconds   INR 7.44 (HH)     Comment: REPEATED TO VERIFY CRITICAL RESULT CALLED TO, READ BACK BY AND VERIFIED WITH: WHITE M. @ 1029 ON [id number] BY HENDERSON L.   Urinalysis, Routine w reflex microscopic     Status: Abnormal   Collection Time: 04/10/17  9:07 AM  Result Value Ref Range   Color, Urine YELLOW YELLOW   APPearance HAZY (A) CLEAR   Specific Gravity, Urine 1.028 1.005 - 1.030   pH 5.0 5.0 - 8.0   Glucose, UA NEGATIVE NEGATIVE mg/dL   Hgb urine dipstick MODERATE (A) NEGATIVE   Bilirubin Urine  NEGATIVE NEGATIVE   Ketones, ur 5 (A) NEGATIVE mg/dL   Protein, ur 30 (A) NEGATIVE mg/dL   Nitrite NEGATIVE NEGATIVE   Leukocytes, UA NEGATIVE NEGATIVE   RBC / HPF 6-30 0 - 5 RBC/hpf   WBC, UA 0-5 0 - 5 WBC/hpf   Bacteria, UA NONE SEEN NONE SEEN   Squamous Epithelial / LPF 0-5 (A) NONE SEEN  CG4 I-STAT (Lactic acid)     Status: None   Collection Time: 04/10/17  9:39 AM  Result Value Ref Range   Lactic Acid, Venous 1.49 0.5 - 1.9 mmol/L  Culture, blood (Routine x 2)     Status: None (Preliminary result)   Collection Time: 04/10/17 10:00 AM  Result Value Ref Range   Specimen Description BLOOD RIGHT FOREARM DRAWN BY RN    Special Requests      BOTTLES DRAWN AEROBIC AND ANAEROBIC Blood Culture adequate volume   Culture  Setup Time      GRAM POSITIVE COCCI GRAM NEGATIVE RODS ANAEROBIC BOTTLE Gram Stain Report Called to,Read Back By and Verified With: STEVENS,A ON 04/11/17 AT 0045 BY LOY,C PERFORMED AT [facility] AEROBIC BOTTLE  GRAM POSTIVE COCCI  MATTHEWS, B  GRAM STAIN REVIEWED-AGREE WITH RESULT Performed at [facility], [address] [place], [place] [address]    Culture GRAM POSITIVE COCCI GRAM NEGATIVE RODS     Report Status PENDING   Blood Culture ID Panel (Reflexed)     Status: Abnormal   Collection Time: 04/10/17 10:00 AM  Result Value Ref Range   Enterococcus species NOT DETECTED NOT DETECTED   Listeria monocytogenes NOT DETECTED NOT DETECTED   Staphylococcus species DETECTED (A) NOT DETECTED    Comment: CRITICAL RESULT CALLED TO, READ BACK BY AND VERIFIED WITH: G.ABBOTT PHARMD 04/11/17 0734 L.CHAMPION    Staphylococcus aureus DETECTED (  A) NOT DETECTED    Comment: Methicillin (oxacillin)-resistant Staphylococcus aureus (MRSA). MRSA is predictably resistant to beta-lactam antibiotics (except ceftaroline). Preferred therapy is vancomycin unless clinically contraindicated. Patient requires contact precautions if  hospitalized. CRITICAL RESULT CALLED TO, READ BACK BY AND  VERIFIED WITH: G.ABBOTT PHARMD 04/11/17 0734 L.CHAMPION    Methicillin resistance DETECTED (A) NOT DETECTED    Comment: CRITICAL RESULT CALLED TO, READ BACK BY AND VERIFIED WITH: G.ABBOTT PHARMD 04/11/17 0734 L.CHAMPION    Streptococcus species NOT DETECTED NOT DETECTED   Streptococcus agalactiae NOT DETECTED NOT DETECTED   Streptococcus pneumoniae NOT DETECTED NOT DETECTED   Streptococcus pyogenes NOT DETECTED NOT DETECTED   Acinetobacter baumannii NOT DETECTED NOT DETECTED   Enterobacteriaceae species DETECTED (A) NOT DETECTED    Comment: Enterobacteriaceae represent a large family of gram-negative bacteria, not a single organism. CRITICAL RESULT CALLED TO, READ BACK BY AND VERIFIED WITH: G.ABBOTT PHARMD 04/11/17 0734 L.CHAMPION    Enterobacter cloacae complex NOT DETECTED NOT DETECTED   Escherichia coli NOT DETECTED NOT DETECTED   Klebsiella oxytoca NOT DETECTED NOT DETECTED   Klebsiella pneumoniae NOT DETECTED NOT DETECTED   Proteus species DETECTED (A) NOT DETECTED    Comment: CRITICAL RESULT CALLED TO, READ BACK BY AND VERIFIED WITH: G.ABBOTT PHARMD 04/11/17 0734 L.CHAMPION    Serratia marcescens NOT DETECTED NOT DETECTED   Carbapenem resistance NOT DETECTED NOT DETECTED   Haemophilus influenzae NOT DETECTED NOT DETECTED   Neisseria meningitidis NOT DETECTED NOT DETECTED   Pseudomonas aeruginosa NOT DETECTED NOT DETECTED   Candida albicans NOT DETECTED NOT DETECTED   Candida glabrata NOT DETECTED NOT DETECTED   Candida krusei NOT DETECTED NOT DETECTED   Candida parapsilosis NOT DETECTED NOT DETECTED   Candida tropicalis NOT DETECTED NOT DETECTED    Comment: Performed at [facility] [address], [place], [place] [address]  Culture, blood (Routine x 2)     Status: None (Preliminary result)   Collection Time: 04/10/17 10:03 AM  Result Value Ref Range   Specimen Description BLOOD LEFT HAND    Special Requests      BOTTLES DRAWN AEROBIC AND ANAEROBIC Blood Culture  adequate volume   Culture  Setup Time      GRAM POSITIVE COCCI ANAEROBIC BOTTLE Gram Stain Report Called to,Read Back By and Verified With: STEVENS,A ON 04/11/17 AT 0045 BY LOY,C PERFORMED AT [facility] AEROBIC BOTTLE GRAM POSITIVE COCCI  MATTHEWS, B     Culture GRAM POSITIVE COCCI    Report Status PENDING   Influenza panel by PCR (type A & B)     Status: None   Collection Time: 04/10/17 10:35 AM  Result Value Ref Range   Influenza A By PCR NEGATIVE NEGATIVE   Influenza B By PCR NEGATIVE NEGATIVE    Comment: (NOTE) The Xpert Xpress Flu assay is intended as an aid in the diagnosis of  influenza and should not be used as a sole basis for treatment.  This  assay is FDA approved for nasopharyngeal swab specimens only. Nasal  washings and aspirates are unacceptable for Xpert Xpress Flu testing.   I-Stat CG4 Lactic Acid, ED     Status: None   Collection Time: 04/10/17 12:25 PM  Result Value Ref Range   Lactic Acid, Venous 0.84 0.5 - 1.9 mmol/L  C-reactive protein     Status: Abnormal   Collection Time: 04/10/17  6:10 PM  Result Value Ref Range   CRP 30.4 (H) <1.0 mg/dL    Comment: Performed at [facility], [place] [address], [place], [place] [address]  Prealbumin     Status: Abnormal   Collection Time: 04/10/17  6:10 PM  Result Value Ref Range   Prealbumin 6.1 (L) 18 - 38 mg/dL    Comment: Performed at [facility] [address] [place], [place] [address]  HIV antibody     Status: None   Collection Time: 04/10/17  6:12 PM  Result Value Ref Range   HIV Screen 4th Generation wRfx Non Reactive Non Reactive    Comment: (NOTE) Performed At: [facility] [place], [place] [id number] Lindon Romp MD QQ:[id number]   Sedimentation rate     Status: Abnormal   Collection Time: 04/10/17  6:12 PM  Result Value Ref Range   Sed Rate 105 (H) 0 - 16 mm/hr  Hemoglobin A1c     Status: Abnormal   Collection Time: 04/10/17  6:12 PM  Result Value Ref Range   Hgb  A1c MFr Bld 5.8 (H) 4.8 - 5.6 %    Comment: (NOTE) Pre diabetes:          5.7%-6.4% Diabetes:              >6.4% Glycemic control for   <7.0% adults with diabetes    Mean Plasma Glucose 119.76 mg/dL    Comment: Performed at [facility] [address] [place], [place] [address]    MICRO: 10/15 gpc and gnr in one set and gpc in 2nd sent IMAGING: Dg Pelvis 1-2 Views  Result Date: 04/10/2017 CLINICAL DATA:  Fever, pelvic pain. History of falls. Rule out osteomyelitis. EXAM: PELVIS - 1-2 VIEW COMPARISON:  Right hip 05/14/2013 FINDINGS: Right hip replacement in satisfactory position. Negative for fracture. No lucency surrounding the prosthesis. Negative for acute pelvic fracture. Chronic fractures with vertebroplasty at L3 and L4. IMPRESSION: No acute abnormality.  Right hip replacement. Electronically Signed   By: Franchot Gallo M.D.   On: 04/10/2017 10:50   Mr Foot Right W Wo Contrast  Result Date: 04/10/2017 CLINICAL DATA:  Multiple soft tissue ulcerations. EXAM: MRI OF THE RIGHT FOREFOOT WITHOUT AND WITH CONTRAST TECHNIQUE: Multiplanar, multisequence MR imaging of the right forefoot was performed before and after the administration of intravenous contrast. CONTRAST:  12m MULTIHANCE GADOBENATE DIMEGLUMINE 529 MG/ML IV SOLN COMPARISON:  Radiographs dated 04/10/2017 and 11/16/2016 and MRI dated 03/25/2016 FINDINGS: Bones/Joint/Cartilage & Soft tissues There is incomplete destruction of the distal phalanx of the right great toe. There is partial destruction of the middle and distal phalanges of the third toe. There is abnormal edema in all 3 of those bones. There is no abnormal enhancement of those bones after contrast administration. However, there is also no enhancement of the soft tissues of the right great toe and there is poor soft tissue enhancement of the other toes. There is no enhancement of the soft tissues around the head of the first metatarsal with poor enhancement of the soft  tissues of the ball of the foot. There is poor enhancement along the lateral aspect of the foot extending proximally to the level of the cuboid. Soft tissue ulcerations are not noted on the dorsal aspect of the forefoot. No appreciable soft tissue abscesses. IMPRESSION: 1. Osteomyelitis of the partially destroyed distal phalanx of the great toe. 2. Osteomyelitis of the middle and distal phalangeal bones of the third toe. 3. Very poor soft tissue perfusion to all the toes and to the ball of the foot particularly  around the head of the first metatarsal. Electronically Signed   By: Lorriane Shire M.D.   On: 04/10/2017 14:14   Dg Chest Port 1 View  Result Date: 04/10/2017 CLINICAL DATA:  Weakness. EXAM: PORTABLE CHEST 1 VIEW COMPARISON:  Radiographs of October 04, 2016. FINDINGS: Stable cardiomegaly. No pneumothorax is noted. Mild central pulmonary vascular congestion is noted. Mild bibasilar subsegmental atelectasis or infiltrates are noted. No definite pleural effusion is noted. Left midlung opacity is noted concerning for atelectasis or infiltrate. Bony thorax is unremarkable. IMPRESSION: Stable cardiomegaly with mild central pulmonary vascular congestion. Mild bibasilar subsegmental atelectasis or infiltrates are noted. Mild left perihilar atelectasis or infiltrate is noted. Followup PA and lateral chest X-ray is recommended in 3-4 weeks following trial of antibiotic therapy to ensure resolution and exclude underlying malignancy. Electronically Signed   By: Marijo Conception, M.D.   On: 04/10/2017 09:34   Dg Foot Complete Right  Result Date: 04/10/2017 CLINICAL DATA:  Fever, shortness of Breath. Open sores on right foot EXAM: RIGHT FOOT COMPLETE - 3+ VIEW COMPARISON:  11/16/2016 FINDINGS: Near complete bone destruction noted in the distal phalanx of the right great toe with only a small amount of residual bone seen on the lateral view concerning for acute osteomyelitis. Diffuse osteopenia throughout the foot.  The metatarsal heads of the second through fifth digits are poorly visualized which may be projectional, but cannot exclude bone destruction. Again noted is bone destruction about the third PIP joint compatible with osteomyelitis, progressed. IMPRESSION: Near complete bony destruction of the distal phalanx of the right great toe compatible with osteomyelitis. Continued osteomyelitis changes at the third PIP joint. Second through fifth metatarsal heads are not well visualized which may be projectional, but cannot exclude osteomyelitis. May consider further evaluation with MRI with and without contrast. Electronically Signed   By: Rolm Baptise M.D.   On: 04/10/2017 10:52    Assessment/Plan:  Polymicrobial bacteremia with gangrenous right foot due to underlying vascular disease. Agree with plan for BKA for source control  -continue on broad spectrum abtx until his BKA and will likely narrow his abtx  - please get TEE to evaluate for endocarditis. Murmur heard on exam - plan for vanco trough of 15-20 for mrsa bacteremia - if TEE is negative, then can treat for 2 wk  - will repeat blood cx tonight and place order for TTE

## 2017-04-11 NOTE — Progress Notes (Signed)
CRITICAL VALUE ALERT  Critical Value:  Aerobic gram positive cocci  Date & Time Notied:  04/11/2017 @ 0039  Provider Notified: [facility]  Orders Received/Actions taken: Pending  

## 2017-04-11 NOTE — Progress Notes (Signed)
CRITICAL VALUE ALERT  Critical Value: Anaerobic gram positive cocci with gram negative rods  Date & Time Notied:  04/11/2017 @ 0039  Provider Notified: [facility]  Orders Received/Actions taken: Pending

## 2017-04-11 NOTE — Progress Notes (Signed)
VASCULAR SURGERY ASSESSMENT & PLAN:   This is an 83 year old gentleman who was admitted yesterday with sepsis secondary to osteomyelitis and gangrene of the right foot. He was seen in consultation by Dr. Arbie Cookey, and I was asked to consider proceeding with right below-the-knee amputation today.   I had seen the patient previously in consultation as a second opinion concerning a nonsalvageable right foot. He had originally been seen in consultation by Dr. Imogene Burn. He subsequently underwent an arteriogram by Dr. Darrick Penna. He had an aortogram with runoff that was done on 10/23/2015. This showed bilateral popliteal artery occlusions with unreconstructable tibial artery occlusive disease bilaterally. Thus for my partners have reviewed these films and we all agree that there is no options for revascularization.  His INR yesterday was 7.4. He has a wound not to distal to the knee which would certainly put him at high risk for having problems healing a right below-the-knee amputation. He feels strongly about trying to do a below the knee although given that he may ultimately require bilateral amputations I have explained to his son that perhaps the safest approach would be above the knee. The son also feels strongly that if at all possible he would like it done below the knee.  I would recommend continuing his intravenous antibiotics to see if the cellulitis in the right leg improves so that we can make a better decision about whether it would be reasonable to attempt a below-the-knee amputation on the right. I began adequate circulation to heal this based on his arteriogram, however, given his chronic venous insufficiency and open wounds on the right leg I think he has significant risk of having healing problems with a below-the-knee amputation.  I have tentatively scheduled him for a right below-the-knee amputation or above-the-knee amputation on Thursday. This will give the medical service chance to correct his  Coumadin and also give the antibiotics that chance to help better determine the level of amputation.   SUBJECTIVE:   Complains of pain in both legs.  PHYSICAL EXAM:   Vitals:   04/10/17 1900 04/10/17 2211 04/11/17 0014 04/11/17 0545  BP: 127/78 (!) 142/52  (!) 114/54  Pulse: 98 96  64  Resp: Temp:  (!) 102.4 F (39.1 C) (!) 102.6 F (39.2 C) 99.5 F (37.5 C)  TempSrc:  Axillary Axillary Oral  SpO2: 92% 90%  90%  Weight:      Height:       Extensive wounds on the right foot and also the right leg with chronic venous insufficiency and cellulitis of the right leg. Chronic venous insufficiency and cellulitis of the left leg also.  LABS:   Lab Results  Component Value Date   WBC 10.3 04/10/2017   HGB 9.4 (L) 04/10/2017   HCT 30.6 (L) 04/10/2017   MCV 89.0 04/10/2017   PLT 250 04/10/2017   Lab Results  Component Value Date   CREATININE 1.07 04/10/2017   Lab Results  Component Value Date   INR 7.44 (HH) 04/10/2017    PROBLEM LIST:    Principal Problem:   Sepsis (HCC) Active Problems:   COPD (chronic obstructive pulmonary disease) (HCC)   ESOPHAGEAL MOTILITY DISORDER   GERD   Dysphagia   Essential hypertension, benign   Chronic pain syndrome   AF (paroxysmal atrial fibrillation) (HCC)   Osteomyelitis (HCC)   DVT (deep venous thrombosis) (HCC)   Fever   Hyperglycemia   Warfarin-induced coagulopathy (HCC)   Elevated INR  Peripheral vascular disease (HCC)   Gangrene of toe of right foot (HCC)   CURRENT MEDS:   . escitalopram  10 mg Oral Daily  . verapamil  120 mg Oral QHS    Cari Caraway Beeper: [phone] Office: [phone] 04/11/2017

## 2017-04-11 NOTE — Progress Notes (Signed)
RN received a call from [place] Loy @ [facility] stating that 4 lab cultures that were drawn on 04/10/17 @ 1000 has resulted with bacterial growth. RN received call on 04/11/17 @ 0039. RN informed [facility]. Nursing will continue to monitor.

## 2017-04-11 NOTE — Consult Note (Signed)
WOC consulted for progressive gangrenous changes to the right foot.  Reviewed images and xrays, his patient has a positive xray or MRI for osteomyelitis, this is considered outside of the scope of practice for the [facility] nurse, for that reason WOC Nurse will not consult.  Dr. Arbie Cookey is planning BKA today.  Based on photos taken in ED, will order foam dressings to cover LLE wound and absorb drainage.    Janney Priego [facility] MSN, RN, [place], CNS, [place] [phone]

## 2017-04-12 ENCOUNTER — Inpatient Hospital Stay (HOSPITAL_COMMUNITY): Payer: Medicare Other

## 2017-04-12 LAB — COMPREHENSIVE METABOLIC PANEL
ALT: 17 U/L (ref 17–63)
ANION GAP: 11 (ref 5–15)
AST: 38 U/L (ref 15–41)
Albumin: 2.1 g/dL — ABNORMAL LOW (ref 3.5–5.0)
Alkaline Phosphatase: 79 U/L (ref 38–126)
BUN: 30 mg/dL — ABNORMAL HIGH (ref 6–20)
CALCIUM: 8.2 mg/dL — AB (ref 8.9–10.3)
CHLORIDE: 102 mmol/L (ref 101–111)
CO2: 18 mmol/L — AB (ref 22–32)
CREATININE: 1.19 mg/dL (ref 0.61–1.24)
GFR, EST NON AFRICAN AMERICAN: 55 mL/min — AB (ref >60)
Glucose, Bld: 131 mg/dL — ABNORMAL HIGH (ref 65–99)
Potassium: 3.5 mmol/L (ref 3.5–5.1)
SODIUM: 131 mmol/L — AB (ref 135–145)
Total Bilirubin: 0.5 mg/dL (ref 0.3–1.2)
Total Protein: 5.5 g/dL — ABNORMAL LOW (ref 6.5–8.1)

## 2017-04-12 LAB — CBC WITH DIFFERENTIAL/PLATELET
BASOS PCT: 0 %
Basophils Absolute: 0 10*3/uL (ref 0.0–0.1)
EOS ABS: 0 10*3/uL (ref 0.0–0.7)
Eosinophils Relative: 0 %
HEMATOCRIT: 25.8 % — AB (ref 39.0–52.0)
HEMOGLOBIN: 8.2 g/dL — AB (ref 13.0–17.0)
LYMPHS ABS: 0.3 10*3/uL — AB (ref 0.7–4.0)
Lymphocytes Relative: 4 %
MCH: 27.5 pg (ref 26.0–34.0)
MCHC: 31.8 g/dL (ref 30.0–36.0)
MCV: 86.6 fL (ref 78.0–100.0)
MONOS PCT: 4 %
Monocytes Absolute: 0.3 10*3/uL (ref 0.1–1.0)
NEUTROS ABS: 5.8 10*3/uL (ref 1.7–7.7)
NEUTROS PCT: 92 %
Platelets: 162 10*3/uL (ref 150–400)
RBC: 2.98 MIL/uL — AB (ref 4.22–5.81)
RDW: 17.9 % — ABNORMAL HIGH (ref 11.5–15.5)
WBC: 6.4 10*3/uL (ref 4.0–10.5)

## 2017-04-12 LAB — ECHOCARDIOGRAM COMPLETE
Height: 71 in
WEIGHTICAEL: 2960 [oz_av]

## 2017-04-12 LAB — PROTIME-INR
INR: 1.73
PROTHROMBIN TIME: 20.1 s — AB (ref 11.4–15.2)

## 2017-04-12 LAB — HEPARIN LEVEL (UNFRACTIONATED): Heparin Unfractionated: 0.11 IU/mL — ABNORMAL LOW (ref 0.30–0.70)

## 2017-04-12 MED ORDER — IPRATROPIUM BROMIDE 0.02 % IN SOLN
0.5000 mg | Freq: Once | RESPIRATORY_TRACT | Status: AC
Start: 2017-04-12 — End: 2017-04-12
  Administered 2017-04-12: 0.5 mg via RESPIRATORY_TRACT
  Filled 2017-04-12: qty 2.5

## 2017-04-12 MED ORDER — FUROSEMIDE 10 MG/ML IJ SOLN
40.0000 mg | Freq: Once | INTRAMUSCULAR | Status: AC
  Administered 2017-04-12: 40 mg via INTRAVENOUS
  Filled 2017-04-12: qty 4

## 2017-04-12 MED ORDER — ALBUTEROL SULFATE (2.5 MG/3ML) 0.083% IN NEBU
5.0000 mg | INHALATION_SOLUTION | Freq: Once | RESPIRATORY_TRACT | Status: AC
  Administered 2017-04-12: 5 mg via RESPIRATORY_TRACT
  Filled 2017-04-12: qty 6

## 2017-04-12 MED ORDER — IPRATROPIUM-ALBUTEROL 0.5-2.5 (3) MG/3ML IN SOLN
3.0000 mL | RESPIRATORY_TRACT | Status: DC | PRN
  Administered 2017-04-14 – 2017-04-19 (×7): 3 mL via RESPIRATORY_TRACT
  Filled 2017-04-12 (×6): qty 3

## 2017-04-12 MED ORDER — HEPARIN (PORCINE) IN NACL 100-0.45 UNIT/ML-% IJ SOLN
1500.0000 [IU]/h | INTRAMUSCULAR | Status: DC
  Administered 2017-04-12: 1150 [IU]/h via INTRAVENOUS
  Administered 2017-04-13: 1350 [IU]/h via INTRAVENOUS
  Filled 2017-04-12 (×2): qty 250

## 2017-04-12 NOTE — Consult Note (Signed)
Referring Physician:  [name] is an 83 y.o. male.                       Chief Complaint: Sepsis r/o endocarditis  HPI: 83 year old male referred for possible TEE to r/o endocarditis. He has severe PVD with Chronic lower leg wounds and osteomyelitis. Patient claims he has recurrent swallowing difficulties, few years ago had esophageal dilatation and is not sure if he will be able to swallow TEE probe without difficulty. INR is elevated 7.44 and down to 2.44 with vitamin K use. He has h/o chronic atrial fibrillation.  Past Medical History:  Diagnosis Date  . Achalasia   . Alcohol abuse    stop drinking since 01/2009  . Arthritis   . Asthma   . Atrial fibrillation (HCC)    off coumadin secondary to fall and subdural  hematoma   . B12 deficiency 9/10   started injection   . BPH (benign prostatic hyperplasia)   . Carcinoma in situ of bladder 2014   Dr. Jeffie [name]  . Chronic kidney disease    RECENT UTI FEW MONTHS AGO TX WITH ANTIBIOTIC  . Compression fracture   . COPD (chronic obstructive pulmonary disease) ([place])   . Diverticula of colon    few small scattered in the sigmoid colon  . Enlarged prostate   . External hemorrhoids 09/2014  . GERD (gastroesophageal reflux disease)   . H/O hiatal hernia   . Headache(784.0)   . Hypertension   . Nutcracker esophagus 2002   on EM  . Osteoporosis   . Peripheral edema   . Reflux   . Venous stasis    chronic       Past Surgical History:  Procedure Laterality Date  . BLADDER ASPIRATION     PROC FOR CANCER CELLS   . CATARACT EXTRACTION W/PHACO  07/30/2012   Procedure: CATARACT EXTRACTION PHACO AND INTRAOCULAR LENS PLACEMENT (IOC);  Surgeon: Williams Che, MD;  Location: [facility];  Service: Ophthalmology;  Laterality: Left;  CDE:31.66  . CATARACT EXTRACTION W/PHACO Right 11/12/2012   Procedure: CATARACT EXTRACTION PHACO AND INTRAOCULAR LENS PLACEMENT (IOC);  Surgeon: Williams Che, MD;  Location: [facility];  Service: Ophthalmology;   Laterality: Right;  CDE 18.25  . CYSTOSCOPY  10/18/2011   Procedure: CYSTOSCOPY;  Surgeon: Malka So, MD;  Location: [facility];  Service: Urology;  Laterality: N/A;  . ESOPHAGOGASTRODUODENOSCOPY  02/11/2010   RMR: GE narrowing s/p dilation 56 & 61 Fr maloney otherwise normal  . ESOPHAGOGASTRODUODENOSCOPY N/A 10/01/2015   Dr.Rourk- mildly severe esophagitis, likely pill-induced, narrowed GE junction without fixed lesion found ?query achalasia, s/p maloney dilation, nomal stomach, normal second portion of the duodenum  . HERNIA REPAIR  12 YRS AGO   RIGHT SIDE   . JOINT REPLACEMENT     RT HIP X2  (1 REPLACEMENT)  . LUNG BIOPSY  10 YRS AGO   BENIGN  . MALONEY DILATION N/A 10/01/2015   Procedure: Venia Minks DILATION;  Surgeon: Daneil Dolin, MD;  Location: [facility];  Service: Endoscopy;  Laterality: N/A;  . PERIPHERAL VASCULAR CATHETERIZATION N/A 10/23/2015   Procedure: Abdominal Aortogram w/Lower Extremity;  Surgeon: Elam Dutch, MD;  Location: [facility];  Service: Cardiovascular;  Laterality: N/A;  . RHINOPLASTY  1974    Family History  Problem Relation Age of Onset  . Cancer Brother   . Hypertension Mother   . Diabetes Mother   . Heart  attack Mother   . Heart disease Mother   . Colon cancer Neg Hx   . Gastric cancer Neg Hx   . Esophageal cancer Neg Hx    Social History:  reports that he has never smoked. He quit smokeless tobacco use about 25 years ago. His smokeless tobacco use included Chew. He reports that he does not drink alcohol or use drugs.  Allergies:  Allergies  Allergen Reactions  . Sulfonamide Derivatives Hives  . Tramadol Itching    Medications Prior to Admission  Medication Sig Dispense Refill  . CALCIUM PO Take 1 tablet by mouth daily.    Marland Kitchen escitalopram (LEXAPRO) 10 MG tablet Take 1 tablet (10 mg total) by mouth daily. 30 tablet 5  . furosemide (LASIX) 40 MG tablet Take 2 tablets every morning (Patient taking differently: Take 80 mg by mouth daily. )  30 tablet 5  . magnesium hydroxide (MILK OF MAGNESIA) 400 MG/5ML suspension Take 15 mLs by mouth daily as needed for mild constipation.    Marland Kitchen oxyCODONE-acetaminophen (PERCOCET) 10-325 MG tablet One tablet 4 times a day as needed for pain 120 tablet 0  . polyethylene glycol (MIRALAX / GLYCOLAX) packet Take 17 g by mouth 2 (two) times daily.    . potassium chloride SA (K-DUR,KLOR-CON) 10 MEQ tablet Take 1 tablet (10 mEq total) by mouth daily. 30 tablet 5  . PSYLLIUM PO Take 1 packet by mouth daily.    Marland Kitchen senna (SENOKOT) 8.6 MG TABS tablet Take 1 tablet (8.6 mg total) by mouth at bedtime.    . verapamil (CALAN-SR) 120 MG CR tablet Take 1 tablet (120 mg total) by mouth at bedtime. 30 tablet 5  . warfarin (COUMADIN) 5 MG tablet TAKE ONE TABLET BY MOUTH ONCE DAILY AT 6PM. 30 tablet 5    Results for orders placed or performed during the hospital encounter of 04/10/17 (from the past 48 hour(s))  C-reactive protein     Status: Abnormal   Collection Time: 04/10/17  6:10 PM  Result Value Ref Range   CRP 30.4 (H) <1.0 mg/dL    Comment: Performed at [facility], [place] [address], [place], [place] [address]  Prealbumin     Status: Abnormal   Collection Time: 04/10/17  6:10 PM  Result Value Ref Range   Prealbumin 6.1 (L) 18 - 38 mg/dL    Comment: Performed at [facility] [address], [place], [place] [address]  HIV antibody     Status: None   Collection Time: 04/10/17  6:12 PM  Result Value Ref Range   HIV Screen 4th Generation wRfx Non Reactive Non Reactive    Comment: (NOTE) Performed At: [facility] [place], [place] [id number] Lindon Romp MD PN:[id number]   Sedimentation rate     Status: Abnormal   Collection Time: 04/10/17  6:12 PM  Result Value Ref Range   Sed Rate 105 (H) 0 - 16 mm/hr  Hemoglobin A1c     Status: Abnormal   Collection Time: 04/10/17  6:12 PM  Result Value Ref Range   Hgb A1c MFr Bld 5.8 (H) 4.8 - 5.6 %    Comment: (NOTE) Pre  diabetes:          5.7%-6.4% Diabetes:              >6.4% Glycemic control for   <7.0% adults with diabetes    Mean Plasma Glucose 119.76 mg/dL    Comment: Performed at [facility], [address] [place], [place] [address]  Protime-INR     Status: Abnormal   Collection Time: 04/11/17 11:18 AM  Result Value Ref Range   Prothrombin Time 24.0 (H) 11.4 - 15.2 seconds   INR 2.17   CBC with Differential/Platelet     Status: Abnormal   Collection Time: 04/11/17 11:18 AM  Result Value Ref Range   WBC 6.1 4.0 - 10.5 K/uL   RBC 3.10 (L) 4.22 - 5.81 MIL/uL   Hemoglobin 8.5 (L) 13.0 - 17.0 g/dL   HCT 27.0 (L) 39.0 - 52.0 %   MCV 87.1 78.0 - 100.0 fL   MCH 27.4 26.0 - 34.0 pg   MCHC 31.5 30.0 - 36.0 g/dL   RDW 18.1 (H) 11.5 - 15.5 %   Platelets 153 150 - 400 K/uL   Neutrophils Relative % 90 %   Neutro Abs 5.4 1.7 - 7.7 K/uL   Lymphocytes Relative 5 %   Lymphs Abs 0.3 (L) 0.7 - 4.0 K/uL   Monocytes Relative 5 %   Monocytes Absolute 0.3 0.1 - 1.0 K/uL   Eosinophils Relative 0 %   Eosinophils Absolute 0.0 0.0 - 0.7 K/uL   Basophils Relative 0 %   Basophils Absolute 0.0 0.0 - 0.1 K/uL  Comprehensive metabolic panel     Status: Abnormal   Collection Time: 04/11/17 11:18 AM  Result Value Ref Range   Sodium 135 135 - 145 mmol/L   Potassium 4.1 3.5 - 5.1 mmol/L    Comment: SLIGHT HEMOLYSIS   Chloride 107 101 - 111 mmol/L   CO2 19 (L) 22 - 32 mmol/L   Glucose, Bld 126 (H) 65 - 99 mg/dL   BUN 24 (H) 6 - 20 mg/dL   Creatinine, Ser 0.94 0.61 - 1.24 mg/dL   Calcium 8.1 (L) 8.9 - 10.3 mg/dL   Total Protein 5.3 (L) 6.5 - 8.1 g/dL   Albumin 2.0 (L) 3.5 - 5.0 g/dL   AST 37 15 - 41 U/L   ALT 13 (L) 17 - 63 U/L   Alkaline Phosphatase 74 38 - 126 U/L   Total Bilirubin 0.9 0.3 - 1.2 mg/dL   GFR calc non Af Amer >60 >60 mL/min   GFR calc Af Amer >60 >60 mL/min    Comment: (NOTE) The eGFR has been calculated using the CKD EPI equation. This calculation has not been validated in all  clinical situations. eGFR's persistently <60 mL/min signify possible Chronic Kidney Disease.    Anion gap 9 5 - 15  Comprehensive metabolic panel     Status: Abnormal   Collection Time: 04/12/17  4:42 AM  Result Value Ref Range   Sodium 131 (L) 135 - 145 mmol/L   Potassium 3.5 3.5 - 5.1 mmol/L   Chloride 102 101 - 111 mmol/L   CO2 18 (L) 22 - 32 mmol/L   Glucose, Bld 131 (H) 65 - 99 mg/dL   BUN 30 (H) 6 - 20 mg/dL   Creatinine, Ser 1.19 0.61 - 1.24 mg/dL   Calcium 8.2 (L) 8.9 - 10.3 mg/dL   Total Protein 5.5 (L) 6.5 - 8.1 g/dL   Albumin 2.1 (L) 3.5 - 5.0 g/dL   AST 38 15 - 41 U/L   ALT 17 17 - 63 U/L   Alkaline Phosphatase 79 38 - 126 U/L   Total Bilirubin 0.5 0.3 - 1.2 mg/dL   GFR calc non Af Amer 55 (L) >60 mL/min   GFR calc Af Amer >60 >60 mL/min  Comment: (NOTE) The eGFR has been calculated using the CKD EPI equation. This calculation has not been validated in all clinical situations. eGFR's persistently <60 mL/min signify possible Chronic Kidney Disease.    Anion gap 11 5 - 15  CBC WITH DIFFERENTIAL     Status: Abnormal   Collection Time: 04/12/17  4:42 AM  Result Value Ref Range   WBC 6.4 4.0 - 10.5 K/uL   RBC 2.98 (L) 4.22 - 5.81 MIL/uL   Hemoglobin 8.2 (L) 13.0 - 17.0 g/dL   HCT 25.8 (L) 39.0 - 52.0 %   MCV 86.6 78.0 - 100.0 fL   MCH 27.5 26.0 - 34.0 pg   MCHC 31.8 30.0 - 36.0 g/dL   RDW 17.9 (H) 11.5 - 15.5 %   Platelets 162 150 - 400 K/uL   Neutrophils Relative % 92 %   Neutro Abs 5.8 1.7 - 7.7 K/uL   Lymphocytes Relative 4 %   Lymphs Abs 0.3 (L) 0.7 - 4.0 K/uL   Monocytes Relative 4 %   Monocytes Absolute 0.3 0.1 - 1.0 K/uL   Eosinophils Relative 0 %   Eosinophils Absolute 0.0 0.0 - 0.7 K/uL   Basophils Relative 0 %   Basophils Absolute 0.0 0.0 - 0.1 K/uL  Protime-INR     Status: Abnormal   Collection Time: 04/12/17  4:42 AM  Result Value Ref Range   Prothrombin Time 20.1 (H) 11.4 - 15.2 seconds   INR 1.73    No results found.  Review Of  Systems Constitutional: Positive fever, chills, weight loss or gain. Eyes: No vision change, wears glasses. No discharge or pain. Ears: Mild hearing loss, No tinnitus. Respiratory: No asthma, COPD, pneumonias. No shortness of breath. No hemoptysis. Cardiovascular: No chest pain, palpitation, leg edema. Gastrointestinal: Positive nausea, vomiting, no diarrhea, constipation. No GI bleed. No hepatitis. Genitourinary: No dysuria, hematuria, kidney stone. No incontinance. Neurological: Positive headache, no stroke, seizures.  Psychiatry: No psych facility admission for anxiety, depression, suicide. No detox. Skin: No rash. Chronic bilateral lower leg wounds and gangrene. Musculoskeletal: Positive joint pain, fibromyalgia. No neck pain, back pain. Lymphadenopathy: No lymphadenopathy. Hematology: No anemia or easy bruising.   Blood pressure 107/60, pulse (!) 59, temperature 97.9 F (36.6 C), temperature source Oral, resp. rate 16, height 5' 11"  (1.803 m), weight 83.9 kg (185 lb), SpO2 93 %. Body mass index is 25.8 kg/m. General appearance: alert, cooperative, appears stated age and no distress Head: Normocephalic, atraumatic. Eyes: Blue eyes, pink conjunctiva, corneas clear. PERRL, EOM's intact. Neck: No adenopathy, no carotid bruit, Positive JVD, supple, symmetrical, trachea midline and thyroid not enlarged. Resp: Basal crackles to auscultation bilaterally. Cardio: Regular rate and rhythm, S1, S2 normal, II/VI systolic murmur, no click, rub or gallop GI: Soft, non-tender; bowel sounds normal; no organomegaly. Extremities: Trace edema, cyanosis or clubbing. Bilateral lower leg ulcers and right great toe foul smell with gangrene Skin: Warm and dry. Cooler lower extremity.  Neurologic: Alert and oriented X 3, normal strength.  Assessment/Plan Right foot gangrene with sepsis Mild CHF, systolic Chronic atrial fibrillation, CHA2DS2VASc score of 5 COPD Arthritis  Small dose additional vitamin  K. Re-visit in AM for possible TEE if patient agrees.  Birdie Riddle, MD  04/12/2017, 2:03 PM

## 2017-04-12 NOTE — Progress Notes (Signed)
PROGRESS NOTE    [name] [name]  JXB:[id number] DOB: 06/27/1932 DOA: 04/10/2017 PCP: Merlyn AlbertLuking, William S, MD   Brief Narrative:  [address] wm Severe peripheral vascular disease  09/2015-aortogram = bilateral popliteal occlusions without reconstructable tibial disease-recommended BKA  Known chronic osteomyelitis--treated 11/16/16 admission Chronic pain Anemia, normocytic? Chronic component Chronic dysphagia-EGD 10/01/15-pill-induced-lost to follow-up and [facility] DVT complicated by prior subdural Chronic A. Fib [place]had score >4 Bipolar COPD not on home oxygen Previous lumbar fractures L3, L4 in 2013 status post kyphoplasty Pulmonary hypertension based on cardiac cath10/2018  Represented to [facility] room 10/15 worsening lower extremity wounds Admitting physician [facility] of operative management under vascular surgery who have been consulted   Assessment & Plan:   Principal Problem:   Sepsis (HCC) Active Problems:   COPD (chronic obstructive pulmonary disease) (HCC)   ESOPHAGEAL MOTILITY DISORDER   GERD   Dysphagia   Essential hypertension, benign   Chronic pain syndrome   AF (paroxysmal atrial fibrillation) (HCC)   Osteomyelitis (HCC)   DVT (deep venous thrombosis) (HCC)   Fever   Hyperglycemia   Warfarin-induced coagulopathy (HCC)   Elevated INR   Peripheral vascular disease (HCC)   Gangrene of toe of right foot (HCC)   Gangrene  Vascular surgery deciding which procedure for patient for know considering BL BKA per patient request as opposed to AKA  Continue supportive therapy with Pain control and perioperative wound management   Antibiotics vancomycin cefepime and  Flagyl until surgery--De-escalate as pemargins  Atrial fibrillation [place]had score >4 DVT  pt had supratherapeutic INR of 7  INR trending down and on last check 1.7  Not currently on any rate controlling agent, continue  telemetry  HTN Pulmonary hypertension  At this time holding Lasix 80 daily, can continue verapamil 120 daily at bedtime  COPD not on home O2 Probable Buerger's disease Secondary to smoking  No current wheeze, when necessary albuterol inhalers--doesn't seem to be on any at home  Chronic reflux and dysphagia  Continue MiraLAX and senna  Watch for dysphagia clinically  Full code Inpatient Pending procedure and consult  Consultants:   Vascular surgery  Procedures:   None yet  Antimicrobials:   ultiple    Subjective:  Awake alert dry mouth wishes to drink no nausea no vomiting Pain is 5/10-6/10 at present but at times is 10 on 10 Wounds not examined however noted to be febrile  Objective: Vitals:   04/11/17 1500 04/11/17 2150 04/12/17 0149 04/12/17 0450  BP: 118/60 119/77  107/60  Pulse: 68 73  (!) 59  Resp: 16 16  16   Temp: 99.4 F (37.4 C) 98 F (36.7 C)  97.9 F (36.6 C)  TempSrc: Oral Oral  Oral  SpO2: 98% 92% 100% 93%  Weight:      Height:        Intake/Output Summary (Last 24 hours) at 04/12/17 1352 Last data filed at 04/12/17 0900  Gross per 24 hour  Intake              360 ml  Output              325 ml  Net               35 ml   Filed Weights   04/10/17 0904 04/10/17 0933  Weight: 86.2 kg (190 lb) 83.9 kg (185 lb)    Examination:  Alert sleepy dry mucosa nonicteric  Pectus carinatum S1-S2 no murmur, or gallops Abdomen soft nontender Is not examined however multiple bandages--- please see documentation and pictures from prior   Data Reviewed: I have personally reviewed following labs and imaging studies  CBC:  Recent Labs Lab 04/10/17 0907 04/11/17 1118 04/12/17 0442  WBC 10.3 6.1 6.4  NEUTROABS 9.7* 5.4 5.8  HGB 9.4* 8.5* 8.2*  HCT 30.6* 27.0* 25.8*  MCV 89.0 87.1 86.6  PLT 250 153 162   Basic Metabolic Panel:  Recent Labs Lab 04/10/17 0907 04/11/17 1118 04/12/17 0442  NA 135 135 131*  K 3.8 4.1 3.5  CL 102 107 102   CO2 20* 19* 18*  GLUCOSE 141* 126* 131*  BUN 34* 24* 30*  CREATININE 1.07 0.94 1.19  CALCIUM 8.7* 8.1* 8.2*   GFR: Estimated Creatinine Clearance: 51 mL/min (by C-G formula based on SCr of 1.19 mg/dL). Liver Function Tests:  Recent Labs Lab 04/10/17 0907 04/11/17 1118 04/12/17 0442  AST 18 37 38  ALT 10* 13* 17  ALKPHOS 98 74 79  BILITOT 0.8 0.9 0.5  PROT 6.8 5.3* 5.5*  ALBUMIN 2.8* 2.0* 2.1*   No results for input(s): LIPASE, AMYLASE in the last 168 hours. No results for input(s): AMMONIA in the last 168 hours. Coagulation Profile:  Recent Labs Lab 04/10/17 0907 04/11/17 1118 04/12/17 0442  INR 7.44* 2.17 1.73   Cardiac Enzymes: No results for input(s): CKTOTAL, CKMB, CKMBINDEX, TROPONINI in the last 168 hours. BNP (last 3 results) No results for input(s): PROBNP in the last 8760 hours. HbA1C:  Recent Labs  04/10/17 1812  HGBA1C 5.8*   CBG: No results for input(s): GLUCAP in the last 168 hours. Lipid Profile: No results for input(s): CHOL, HDL, LDLCALC, TRIG, CHOLHDL, LDLDIRECT in the last 72 hours. Thyroid Function Tests: No results for input(s): TSH, T4TOTAL, FREET4, T3FREE, THYROIDAB in the last 72 hours. Anemia Panel: No results for input(s): VITAMINB12, FOLATE, FERRITIN, TIBC, IRON, RETICCTPCT in the last 72 hours. Urine analysis:    Component Value Date/Time   COLORURINE YELLOW 04/10/2017 0907   APPEARANCEUR HAZY (A) 04/10/2017 0907   LABSPEC 1.028 04/10/2017 0907   PHURINE 5.0 04/10/2017 0907   GLUCOSEU NEGATIVE 04/10/2017 0907   HGBUR MODERATE (A) 04/10/2017 0907   BILIRUBINUR NEGATIVE 04/10/2017 0907   KETONESUR 5 (A) 04/10/2017 0907   PROTEINUR 30 (A) 04/10/2017 0907   UROBILINOGEN 0.2 02/21/2011 1842   NITRITE NEGATIVE 04/10/2017 0907   LEUKOCYTESUR NEGATIVE 04/10/2017 1610     Radiology Studies: Reviewed images personally in health database    Scheduled Meds: . escitalopram  10 mg Oral Daily  . feeding supplement (ENSURE  ENLIVE)  237 mL Oral BID BM  . verapamil  120 mg Oral QHS   Continuous Infusions: . ceFEPime (MAXIPIME) IV Stopped (04/12/17 1120)  . heparin 1,150 Units/hr (04/12/17 0844)  . metronidazole Stopped (04/12/17 0945)  . vancomycin Stopped (04/12/17 1150)     LOS: 2 days    Time spent: warty 5  Daisja Kessinger  [facility] (P) [phone]   If 7PM-7AM, please contact night-coverage [email or web address] Password [email or web address] 04/12/2017, 1:52 PM

## 2017-04-12 NOTE — Progress Notes (Signed)
  Echocardiogram 2D Echocardiogram has been performed.  Leta JunglingCooper, Brena Windsor M 04/12/2017, 10:57 AM

## 2017-04-12 NOTE — Progress Notes (Signed)
ANTICOAGULATION CONSULT NOTE Pharmacy Consult for Heparin Indication: atrial fibrillation  Allergies  Allergen Reactions  . Sulfonamide Derivatives Hives  . Tramadol Itching    Patient Measurements: Height: 5\' 11"  (180.3 cm) Weight: 185 lb (83.9 kg) IBW/kg (Calculated) : 75.3  Vital Signs: Temp: 97.9 F (36.6 C) (10/17 0450) Temp Source: Oral (10/17 0450) BP: 107/60 (10/17 0450) Pulse Rate: 59 (10/17 0450)  Labs:  Recent Labs  04/10/17 0907 04/11/17 1118 04/12/17 0442  HGB 9.4* 8.5*  --   HCT 30.6* 27.0*  --   PLT 250 153  --   LABPROT 62.8* 24.0* 20.1*  INR 7.44* 2.17 1.73  CREATININE 1.07 0.94 1.19    Estimated Creatinine Clearance: 51 mL/min (by C-G formula based on SCr of 1.19 mg/dL).   Assessment: 83 y.o. male with h/o Afib, Coumadin on hold and INR subtherapeutic, for heparin Goal of Therapy:  Heparin level 0.3-0.7 Monitor platelets by anticoagulation protocol: Yes   Plan:  Start heparin 1150 units/hr Check heparin level in 8 hours.   Azarah Dacy, Gary FleetGregory Vernon 04/12/2017,6:45 AM

## 2017-04-12 NOTE — Progress Notes (Signed)
   VASCULAR SURGERY ASSESSMENT & PLAN:   Scheduled for Right BKA tomorrow. He feels strongly about having a BKA and not an AKA. I have explained that there is a 15-20% of not healing a BKA. My main concern is the wound on his anterior lateral right leg and the chronic venous insufficiency of the right leg. These could significantly affect his chance for healing.  INR is down to 1.73  Cellulitis left leg has significantly improved on Cefepime, Flagyl, and Vanco.    SUBJECTIVE:   Once the dressing off of his foot because it is wet.  PHYSICAL EXAM:   Vitals:   04/11/17 1500 04/11/17 2150 04/12/17 0149 04/12/17 0450  BP: 118/60 119/77  107/60  Pulse: 68 73  (!) 59  Resp: 16 16  16   Temp: 99.4 F (37.4 C) 98 F (36.7 C)  97.9 F (36.6 C)  TempSrc: Oral Oral  Oral  SpO2: 98% 92% 100% 93%  Weight:      Height:       Gangrenous changes in the right foot are stable. Cellulitis has significantly improved in both lower extremities.  LABS:   Lab Results  Component Value Date   WBC 6.1 04/11/2017   HGB 8.5 (L) 04/11/2017   HCT 27.0 (L) 04/11/2017   MCV 87.1 04/11/2017   PLT 153 04/11/2017   Lab Results  Component Value Date   CREATININE 1.19 04/12/2017   Lab Results  Component Value Date   INR 1.73 04/12/2017   CBG (last 3)  No results for input(s): GLUCAP in the last 72 hours.  PROBLEM LIST:    Principal Problem:   Sepsis (HCC) Active Problems:   COPD (chronic obstructive pulmonary disease) (HCC)   ESOPHAGEAL MOTILITY DISORDER   GERD   Dysphagia   Essential hypertension, benign   Chronic pain syndrome   AF (paroxysmal atrial fibrillation) (HCC)   Osteomyelitis (HCC)   DVT (deep venous thrombosis) (HCC)   Fever   Hyperglycemia   Warfarin-induced coagulopathy (HCC)   Elevated INR   Peripheral vascular disease (HCC)   Gangrene of toe of right foot (HCC)   CURRENT MEDS:   . escitalopram  10 mg Oral Daily  . feeding supplement (ENSURE ENLIVE)  237 mL Oral  BID BM  . verapamil  120 mg Oral QHS    [name] FerrariChristopher [name] [name] Beeper: [phone] Office: [phone] 04/12/2017

## 2017-04-12 NOTE — Progress Notes (Signed)
ANTICOAGULATION CONSULT NOTE Pharmacy Consult for Heparin Indication: atrial fibrillation  Allergies  Allergen Reactions  . Sulfonamide Derivatives Hives  . Tramadol Itching    Patient Measurements: Height: 5\' 11"  (180.3 cm) Weight: 185 lb (83.9 kg) IBW/kg (Calculated) : 75.3  Vital Signs: Temp: 98.1 F (36.7 C) (10/17 1500) Temp Source: Oral (10/17 1500) BP: 100/62 (10/17 1500) Pulse Rate: 69 (10/17 1500)  Labs:  Recent Labs  04/10/17 0907 04/11/17 1118 04/12/17 0442 04/12/17 1602  HGB 9.4* 8.5* 8.2*  --   HCT 30.6* 27.0* 25.8*  --   PLT 250 153 162  --   LABPROT 62.8* 24.0* 20.1*  --   INR 7.44* 2.17 1.73  --   HEPARINUNFRC  --   --   --  0.11*  CREATININE 1.07 0.94 1.19  --     Estimated Creatinine Clearance: 51 mL/min (by C-G formula based on SCr of 1.19 mg/dL).   Assessment: 83 y.o. male with h/o Afib, Coumadin on hold, continues on heparin awaiting amputation 10/18  Heparin level low at 0.11  Goal of Therapy:  Heparin level 0.3-0.7 Monitor platelets by anticoagulation protocol: Yes   Plan:  Increase heparin to 1350 units / hr Follow up AM heparin level   Thank you Okey RegalLisa Zeb Rawl, PharmD [phone]  Elwin SleightPowell, Gayna Braddy Kay 04/12/2017,5:55 PM

## 2017-04-12 NOTE — Consult Note (Signed)
Ref: Merlyn Albert, MD   Subjective:  Feeling better. Still not ready for TEE, says has trouble swallowing food. Transthoracic windows are good with adequate valves detail and motion hence will let go TEE in this patient. He has Mild MR, TR, AI and PI. He has significant calcification of AV with moderate restriction of non-coronary cusp but mild AS only.  Objective:  Vital Signs in the last 24 hours: Temp:  [97.9 F (36.6 C)-99.4 F (37.4 C)] 97.9 F (36.6 C) (10/17 0450) Pulse Rate:  [59-73] 59 (10/17 0450) Cardiac Rhythm: Atrial fibrillation (10/17 0700) Resp:  [16] 16 (10/17 0450) BP: (107-119)/(60-77) 107/60 (10/17 0450) SpO2:  [92 %-100 %] 93 % (10/17 0450)  Physical Exam: BP Readings from Last 1 Encounters:  04/12/17 107/60    Wt Readings from Last 1 Encounters:  04/10/17 83.9 kg (185 lb)    Weight change:  Body mass index is 25.8 kg/m. HEENT: [place]/AT, Eyes-Brown, PERL, EOMI, Conjunctiva-Pink, Sclera-Non-icteric Neck: Positive JVD, No bruit, Trachea midline. Lungs:  Clear, Bilateral. Cardiac:  Regular rhythm, normal S1 and S2, no S3. III/VI systolic and II/VI diastolic murmur. Abdomen:  Soft, non-tender. BS present. Extremities:  Trace edema with right foot gangrene and bilateral lower leg multiple wounds as before. No cyanosis. No clubbing. CNS: AxOx3, Cranial nerves grossly intact, moves all 4 extremities.  Skin: Warm and dry.   Intake/Output from previous day: 10/16 0701 - 10/17 0700 In: 360 [P.O.:360] Out: 325 [Urine:325]    Lab Results: BMET    Component Value Date/Time   NA 131 (L) 04/12/2017 0442   NA 135 04/11/2017 1118   NA 135 04/10/2017 0907   NA 141 10/24/2016 1055   NA 140 01/06/2016 1529   NA 140 04/07/2015 1143   K 3.5 04/12/2017 0442   K 4.1 04/11/2017 1118   K 3.8 04/10/2017 0907   CL 102 04/12/2017 0442   CL 107 04/11/2017 1118   CL 102 04/10/2017 0907   CO2 18 (L) 04/12/2017 0442   CO2 19 (L) 04/11/2017 1118   CO2 20 (L)  04/10/2017 0907   GLUCOSE 131 (H) 04/12/2017 0442   GLUCOSE 126 (H) 04/11/2017 1118   GLUCOSE 141 (H) 04/10/2017 0907   BUN 30 (H) 04/12/2017 0442   BUN 24 (H) 04/11/2017 1118   BUN 34 (H) 04/10/2017 0907   BUN 28 (H) 10/24/2016 1055   BUN 23 01/06/2016 1529   BUN 19 04/07/2015 1143   CREATININE 1.19 04/12/2017 0442   CREATININE 0.94 04/11/2017 1118   CREATININE 1.07 04/10/2017 0907   CREATININE 1.02 02/21/2014 1047   CREATININE 0.86 08/21/2013 0949   CREATININE 1.27 09/18/2012 1334   CALCIUM 8.2 (L) 04/12/2017 0442   CALCIUM 8.1 (L) 04/11/2017 1118   CALCIUM 8.7 (L) 04/10/2017 0907   GFRNONAA 55 (L) 04/12/2017 0442   GFRNONAA >60 04/11/2017 1118   GFRNONAA >60 04/10/2017 0907   GFRAA >60 04/12/2017 0442   GFRAA >60 04/11/2017 1118   GFRAA >60 04/10/2017 0907   CBC    Component Value Date/Time   WBC 6.4 04/12/2017 0442   RBC 2.98 (L) 04/12/2017 0442   HGB 8.2 (L) 04/12/2017 0442   HGB 10.3 (L) 10/24/2016 1055   HCT 25.8 (L) 04/12/2017 0442   HCT 32.2 (L) 10/24/2016 1055   PLT 162 04/12/2017 0442   PLT 246 10/24/2016 1055   MCV 86.6 04/12/2017 0442   MCV 89 10/24/2016 1055   MCH 27.5 04/12/2017 0442   MCHC 31.8 04/12/2017 0442  RDW 17.9 (H) 04/12/2017 0442   RDW 15.2 10/24/2016 1055   LYMPHSABS 0.3 (L) 04/12/2017 0442   LYMPHSABS 0.8 10/24/2016 1055   MONOABS 0.3 04/12/2017 0442   EOSABS 0.0 04/12/2017 0442   EOSABS 0.7 (H) 10/24/2016 1055   BASOSABS 0.0 04/12/2017 0442   BASOSABS 0.1 10/24/2016 1055   HEPATIC Function Panel  Recent Labs  04/10/17 0907 04/11/17 1118 04/12/17 0442  PROT 6.8 5.3* 5.5*   HEMOGLOBIN A1C No components found for: HGA1C,  MPG CARDIAC ENZYMES Lab Results  Component Value Date   CKTOTAL 34 12/18/2009   CKMB 1.4 12/18/2009   TROPONINI 0.03 (HH) 10/04/2016   TROPONINI 0.03 (HH) 10/04/2016   TROPONINI 0.03 (HH) 10/04/2016   BNP No results for input(s): PROBNP in the last 8760 hours. TSH No results for input(s): TSH in the  last 8760 hours. CHOLESTEROL  Recent Labs  10/24/16 1055  CHOL 193    Scheduled Meds: . escitalopram  10 mg Oral Daily  . feeding supplement (ENSURE ENLIVE)  237 mL Oral BID BM  . verapamil  120 mg Oral QHS   Continuous Infusions: . ceFEPime (MAXIPIME) IV Stopped (04/12/17 1120)  . heparin 1,150 Units/hr (04/12/17 0844)  . metronidazole Stopped (04/12/17 0945)  . vancomycin Stopped (04/12/17 1150)   PRN Meds:.ipratropium-albuterol, morphine injection, [DISCONTINUED] ondansetron **OR** ondansetron (ZOFRAN) IV, oxyCODONE-acetaminophen **AND** oxyCODONE  Assessment/Plan: Right foot gangrene and sepsis Mild systolic CHF Chronic atrial fibrillation Long-term anticoagulation medication use COPD Arthritis H/O lower esophageal stricture  Continue medical treatment with no heart valve vegetations. IV lasix one dose, then PO as needed.   LOS: 2 days    Orpah CobbAjay Safaa Stingley  MD  04/12/2017, 2:16 PM

## 2017-04-13 ENCOUNTER — Ambulatory Visit: Payer: Medicare Other | Admitting: Family Medicine

## 2017-04-13 ENCOUNTER — Inpatient Hospital Stay (HOSPITAL_COMMUNITY): Payer: Medicare Other | Admitting: Anesthesiology

## 2017-04-13 ENCOUNTER — Encounter (HOSPITAL_COMMUNITY)
Admission: EM | Disposition: A | Discharge status: Skilled Nursing Facility | Payer: Self-pay | Source: Home / Self Care | Attending: Family Medicine

## 2017-04-13 ENCOUNTER — Encounter (HOSPITAL_COMMUNITY): Payer: Self-pay | Admitting: Anesthesiology

## 2017-04-13 DIAGNOSIS — I96 Gangrene, not elsewhere classified: Secondary | ICD-10-CM

## 2017-04-13 HISTORY — PX: AMPUTATION: SHX166

## 2017-04-13 LAB — CULTURE, BLOOD (ROUTINE X 2)
Special Requests: ADEQUATE
Special Requests: ADEQUATE

## 2017-04-13 LAB — COMPREHENSIVE METABOLIC PANEL
ALBUMIN: 1.9 g/dL — AB (ref 3.5–5.0)
ALK PHOS: 72 U/L (ref 38–126)
ALT: 22 U/L (ref 17–63)
AST: 47 U/L — AB (ref 15–41)
Anion gap: 10 (ref 5–15)
BUN: 36 mg/dL — AB (ref 6–20)
CALCIUM: 7.9 mg/dL — AB (ref 8.9–10.3)
CHLORIDE: 100 mmol/L — AB (ref 101–111)
CO2: 21 mmol/L — AB (ref 22–32)
CREATININE: 1.2 mg/dL (ref 0.61–1.24)
GFR calc Af Amer: >60 mL/min (ref >60)
GFR calc non Af Amer: 54 mL/min — ABNORMAL LOW (ref >60)
GLUCOSE: 111 mg/dL — AB (ref 65–99)
Potassium: 3.2 mmol/L — ABNORMAL LOW (ref 3.5–5.1)
SODIUM: 131 mmol/L — AB (ref 135–145)
Total Bilirubin: 0.6 mg/dL (ref 0.3–1.2)
Total Protein: 5.2 g/dL — ABNORMAL LOW (ref 6.5–8.1)

## 2017-04-13 LAB — CBC WITH DIFFERENTIAL/PLATELET
BASOS PCT: 0 %
Basophils Absolute: 0 10*3/uL (ref 0.0–0.1)
Eosinophils Absolute: 0 10*3/uL (ref 0.0–0.7)
Eosinophils Relative: 0 %
HCT: 24.7 % — ABNORMAL LOW (ref 39.0–52.0)
HEMOGLOBIN: 7.8 g/dL — AB (ref 13.0–17.0)
Lymphocytes Relative: 12 %
Lymphs Abs: 0.6 10*3/uL — ABNORMAL LOW (ref 0.7–4.0)
MCH: 26.7 pg (ref 26.0–34.0)
MCHC: 31.6 g/dL (ref 30.0–36.0)
MCV: 84.6 fL (ref 78.0–100.0)
MONO ABS: 0.5 10*3/uL (ref 0.1–1.0)
MONOS PCT: 9 %
NEUTROS ABS: 4 10*3/uL (ref 1.7–7.7)
Neutrophils Relative %: 79 %
PLATELETS: 143 10*3/uL — AB (ref 150–400)
RBC: 2.92 MIL/uL — AB (ref 4.22–5.81)
RDW: 17 % — ABNORMAL HIGH (ref 11.5–15.5)
WBC: 5.1 10*3/uL (ref 4.0–10.5)

## 2017-04-13 LAB — PROTIME-INR
INR: 1.42
Prothrombin Time: 17.2 seconds — ABNORMAL HIGH (ref 11.4–15.2)

## 2017-04-13 LAB — HEPARIN LEVEL (UNFRACTIONATED): Heparin Unfractionated: 0.2 IU/mL — ABNORMAL LOW (ref 0.30–0.70)

## 2017-04-13 SURGERY — AMPUTATION BELOW KNEE
Anesthesia: General | Laterality: Right

## 2017-04-13 MED ORDER — KCL IN DEXTROSE-NACL 20-5-0.45 MEQ/L-%-% IV SOLN
INTRAVENOUS | Status: AC
  Administered 2017-04-13: 50 mL/h via INTRAVENOUS
  Filled 2017-04-13: qty 1000

## 2017-04-13 MED ORDER — HYDROMORPHONE HCL 1 MG/ML IJ SOLN
INTRAMUSCULAR | Status: AC
  Administered 2017-04-13: 0.25 mg via INTRAVENOUS
  Filled 2017-04-13: qty 1

## 2017-04-13 MED ORDER — ACETAMINOPHEN 650 MG RE SUPP: 325.0000 mg | RECTAL | Status: DC | PRN

## 2017-04-13 MED ORDER — OXYCODONE-ACETAMINOPHEN 5-325 MG PO TABS
1.0000 | ORAL_TABLET | ORAL | Status: DC | PRN
  Administered 2017-04-13 – 2017-04-17 (×13): 2 via ORAL
  Administered 2017-04-17: 1 via ORAL
  Administered 2017-04-17 – 2017-04-18 (×4): 2 via ORAL
  Filled 2017-04-13 (×11): qty 2
  Filled 2017-04-13: qty 1
  Filled 2017-04-13 (×6): qty 2

## 2017-04-13 MED ORDER — POTASSIUM CHLORIDE CRYS ER 20 MEQ PO TBCR
40.0000 meq | EXTENDED_RELEASE_TABLET | Freq: Once | ORAL | Status: AC
  Administered 2017-04-13: 20 meq via ORAL
  Filled 2017-04-13: qty 2

## 2017-04-13 MED ORDER — ROCURONIUM BROMIDE 100 MG/10ML IV SOLN
INTRAVENOUS | Status: DC | PRN
  Administered 2017-04-13: 20 mg via INTRAVENOUS

## 2017-04-13 MED ORDER — ALUM & MAG HYDROXIDE-SIMETH 200-200-20 MG/5ML PO SUSP
15.0000 mL | ORAL | Status: DC | PRN
  Administered 2017-04-18: 15 mL via ORAL
  Filled 2017-04-13: qty 30

## 2017-04-13 MED ORDER — ROCURONIUM BROMIDE 50 MG/5ML IV SOLN
INTRAVENOUS | Status: AC
  Filled 2017-04-13: qty 1

## 2017-04-13 MED ORDER — CEFTRIAXONE SODIUM 2 G IJ SOLR
2.0000 g | INTRAMUSCULAR | Status: DC
  Administered 2017-04-13 – 2017-04-17 (×5): 2 g via INTRAVENOUS
  Filled 2017-04-13 (×7): qty 2

## 2017-04-13 MED ORDER — HYDROMORPHONE HCL 1 MG/ML IJ SOLN
0.5000 mg | INTRAMUSCULAR | Status: DC | PRN
  Administered 2017-04-13 – 2017-04-15 (×3): 1 mg via INTRAVENOUS
  Filled 2017-04-13 (×3): qty 1

## 2017-04-13 MED ORDER — SODIUM CHLORIDE 0.9 % IV SOLN
INTRAVENOUS | Status: DC | PRN
  Administered 2017-04-13: 11:00:00 via INTRAVENOUS

## 2017-04-13 MED ORDER — HYDROMORPHONE HCL 1 MG/ML IJ SOLN
0.2500 mg | INTRAMUSCULAR | Status: DC | PRN
  Administered 2017-04-13: 0.25 mg via INTRAVENOUS

## 2017-04-13 MED ORDER — FENTANYL CITRATE (PF) 250 MCG/5ML IJ SOLN
INTRAMUSCULAR | Status: AC
  Filled 2017-04-13: qty 5

## 2017-04-13 MED ORDER — PHENOL 1.4 % MT LIQD: 1.0000 | OROMUCOSAL | Status: DC | PRN

## 2017-04-13 MED ORDER — NEOSTIGMINE METHYLSULFATE 10 MG/10ML IV SOLN
INTRAVENOUS | Status: DC | PRN
  Administered 2017-04-13: 3 mg via INTRAVENOUS

## 2017-04-13 MED ORDER — 0.9 % SODIUM CHLORIDE (POUR BTL) OPTIME
TOPICAL | Status: DC | PRN
  Administered 2017-04-13: 1000 mL

## 2017-04-13 MED ORDER — WHITE PETROLATUM EX OINT
TOPICAL_OINTMENT | CUTANEOUS | Status: AC
  Administered 2017-04-13: 14:00:00
  Filled 2017-04-13: qty 28.35

## 2017-04-13 MED ORDER — PANTOPRAZOLE SODIUM 40 MG PO TBEC
40.0000 mg | DELAYED_RELEASE_TABLET | Freq: Every day | ORAL | Status: DC
  Administered 2017-04-13 – 2017-04-20 (×8): 40 mg via ORAL
  Filled 2017-04-13 (×8): qty 1

## 2017-04-13 MED ORDER — PROMETHAZINE HCL 25 MG/ML IJ SOLN: 6.2500 mg | INTRAMUSCULAR | Status: DC | PRN

## 2017-04-13 MED ORDER — HEPARIN (PORCINE) IN NACL 100-0.45 UNIT/ML-% IJ SOLN
1850.0000 [IU]/h | INTRAMUSCULAR | Status: DC
  Administered 2017-04-13: 1500 [IU]/h via INTRAVENOUS
  Administered 2017-04-14: 1700 [IU]/h via INTRAVENOUS
  Administered 2017-04-15 (×2): 1800 [IU]/h via INTRAVENOUS
  Administered 2017-04-16 – 2017-04-17 (×2): 1850 [IU]/h via INTRAVENOUS
  Filled 2017-04-13 (×8): qty 250

## 2017-04-13 MED ORDER — ETOMIDATE 2 MG/ML IV SOLN
INTRAVENOUS | Status: AC
  Filled 2017-04-13: qty 10

## 2017-04-13 MED ORDER — KCL IN DEXTROSE-NACL 20-5-0.45 MEQ/L-%-% IV SOLN
INTRAVENOUS | Status: DC
  Administered 2017-04-13: 50 mL/h via INTRAVENOUS
  Filled 2017-04-13: qty 1000

## 2017-04-13 MED ORDER — GLYCOPYRROLATE 0.2 MG/ML IJ SOLN
INTRAMUSCULAR | Status: DC | PRN
  Administered 2017-04-13: 0.4 mg via INTRAVENOUS

## 2017-04-13 MED ORDER — DOCUSATE SODIUM 100 MG PO CAPS
100.0000 mg | ORAL_CAPSULE | Freq: Every day | ORAL | Status: DC
  Administered 2017-04-14: 100 mg via ORAL
  Filled 2017-04-13: qty 1

## 2017-04-13 MED ORDER — ACETAMINOPHEN 325 MG PO TABS
325.0000 mg | ORAL_TABLET | ORAL | Status: DC | PRN
  Administered 2017-04-18: 650 mg via ORAL
  Filled 2017-04-13: qty 1
  Filled 2017-04-13: qty 2

## 2017-04-13 MED ORDER — PROPOFOL 10 MG/ML IV BOLUS
INTRAVENOUS | Status: AC
  Filled 2017-04-13: qty 40

## 2017-04-13 MED ORDER — SUCCINYLCHOLINE CHLORIDE 200 MG/10ML IV SOSY
PREFILLED_SYRINGE | INTRAVENOUS | Status: AC
  Filled 2017-04-13: qty 10

## 2017-04-13 MED ORDER — POTASSIUM CHLORIDE CRYS ER 20 MEQ PO TBCR: 20.0000 meq | EXTENDED_RELEASE_TABLET | Freq: Once | ORAL | Status: DC | PRN

## 2017-04-13 MED ORDER — METOPROLOL TARTRATE 5 MG/5ML IV SOLN: 2.0000 mg | INTRAVENOUS | Status: DC | PRN

## 2017-04-13 MED ORDER — DEXTROSE 5 % IV SOLN
1.5000 g | Freq: Two times a day (BID) | INTRAVENOUS | Status: DC
  Administered 2017-04-13: 1.5 g via INTRAVENOUS
  Filled 2017-04-13 (×2): qty 1.5

## 2017-04-13 MED ORDER — BACITRACIN ZINC 500 UNIT/GM EX OINT
TOPICAL_OINTMENT | CUTANEOUS | Status: AC
  Filled 2017-04-13: qty 28.35

## 2017-04-13 MED ORDER — LIDOCAINE HCL (CARDIAC) 20 MG/ML IV SOLN
INTRAVENOUS | Status: DC | PRN
  Administered 2017-04-13: 60 mg via INTRAVENOUS

## 2017-04-13 MED ORDER — ONDANSETRON HCL 4 MG/2ML IJ SOLN
4.0000 mg | Freq: Four times a day (QID) | INTRAMUSCULAR | Status: DC | PRN
  Administered 2017-04-15: 4 mg via INTRAVENOUS
  Filled 2017-04-13: qty 2

## 2017-04-13 MED ORDER — LABETALOL HCL 5 MG/ML IV SOLN
10.0000 mg | INTRAVENOUS | Status: DC | PRN
  Filled 2017-04-13: qty 4

## 2017-04-13 MED ORDER — ETOMIDATE 2 MG/ML IV SOLN
INTRAVENOUS | Status: DC | PRN
  Administered 2017-04-13: 12 mg via INTRAVENOUS

## 2017-04-13 MED ORDER — PANTOPRAZOLE SODIUM 40 MG PO TBEC: 40.0000 mg | DELAYED_RELEASE_TABLET | Freq: Every day | ORAL | Status: DC

## 2017-04-13 MED ORDER — HYDRALAZINE HCL 20 MG/ML IJ SOLN: 5.0000 mg | INTRAMUSCULAR | Status: DC | PRN

## 2017-04-13 MED ORDER — MIDAZOLAM HCL 2 MG/2ML IJ SOLN
INTRAMUSCULAR | Status: AC
  Filled 2017-04-13: qty 2

## 2017-04-13 MED ORDER — GUAIFENESIN-DM 100-10 MG/5ML PO SYRP
15.0000 mL | ORAL_SOLUTION | ORAL | Status: DC | PRN
  Administered 2017-04-19: 15 mL via ORAL
  Filled 2017-04-13: qty 15

## 2017-04-13 MED ORDER — MAGNESIUM SULFATE 2 GM/50ML IV SOLN
2.0000 g | Freq: Once | INTRAVENOUS | Status: DC | PRN
Start: 2017-04-13 — End: 2017-04-17
  Filled 2017-04-13: qty 50

## 2017-04-13 MED ORDER — FENTANYL CITRATE (PF) 100 MCG/2ML IJ SOLN
INTRAMUSCULAR | Status: DC | PRN
  Administered 2017-04-13 (×3): 50 ug via INTRAVENOUS

## 2017-04-13 MED ORDER — BACITRACIN ZINC 500 UNIT/GM EX OINT
TOPICAL_OINTMENT | CUTANEOUS | Status: DC | PRN
  Administered 2017-04-13: 1 via TOPICAL

## 2017-04-13 SURGICAL SUPPLY — 55 items
BANDAGE ACE 4X5 VEL STRL LF (GAUZE/BANDAGES/DRESSINGS) ×4 IMPLANT
BANDAGE ESMARK 6X9 LF (GAUZE/BANDAGES/DRESSINGS) ×1 IMPLANT
BLADE SAW RECIP 87.9 MT (BLADE) ×3 IMPLANT
BNDG CMPR 9X6 STRL LF SNTH (GAUZE/BANDAGES/DRESSINGS) ×1
BNDG COHESIVE 6X5 TAN STRL LF (GAUZE/BANDAGES/DRESSINGS) ×3 IMPLANT
BNDG ESMARK 6X9 LF (GAUZE/BANDAGES/DRESSINGS) ×3
BNDG GAUZE ELAST 4 BULKY (GAUZE/BANDAGES/DRESSINGS) ×4 IMPLANT
CANISTER SUCT 3000ML PPV (MISCELLANEOUS) ×6 IMPLANT
CLIP VESOCCLUDE MED 6/CT (CLIP) IMPLANT
COVER SURGICAL LIGHT HANDLE (MISCELLANEOUS) ×3 IMPLANT
CUFF TOURNIQUET SINGLE 24IN (TOURNIQUET CUFF) ×2 IMPLANT
CUFF TOURNIQUET SINGLE 34IN LL (TOURNIQUET CUFF) IMPLANT
CUFF TOURNIQUET SINGLE 44IN (TOURNIQUET CUFF) IMPLANT
DRAIN CHANNEL 19F RND (DRAIN) IMPLANT
DRAPE HALF SHEET 40X57 (DRAPES) ×3 IMPLANT
DRAPE ORTHO SPLIT 77X108 STRL (DRAPES) ×6
DRAPE SURG ORHT 6 SPLT 77X108 (DRAPES) ×2 IMPLANT
DRAPE U-SHAPE 47X51 STRL (DRAPES) ×3 IMPLANT
DRSG ADAPTIC 3X8 NADH LF (GAUZE/BANDAGES/DRESSINGS) ×3 IMPLANT
ELECT REM PT RETURN 9FT ADLT (ELECTROSURGICAL) ×3
ELECTRODE REM PT RTRN 9FT ADLT (ELECTROSURGICAL) ×1 IMPLANT
EVACUATOR SILICONE 100CC (DRAIN) IMPLANT
GAUZE SPONGE 4X4 12PLY STRL (GAUZE/BANDAGES/DRESSINGS) ×4 IMPLANT
GLOVE BIO SURGEON STRL SZ 6.5 (GLOVE) ×1 IMPLANT
GLOVE BIO SURGEON STRL SZ7.5 (GLOVE) ×5 IMPLANT
GLOVE BIO SURGEONS STRL SZ 6.5 (GLOVE) ×1
GLOVE BIOGEL PI IND STRL 6.5 (GLOVE) IMPLANT
GLOVE BIOGEL PI IND STRL 8 (GLOVE) ×1 IMPLANT
GLOVE BIOGEL PI IND STRL 8.5 (GLOVE) IMPLANT
GLOVE BIOGEL PI INDICATOR 6.5 (GLOVE) ×2
GLOVE BIOGEL PI INDICATOR 8 (GLOVE) ×2
GLOVE BIOGEL PI INDICATOR 8.5 (GLOVE) ×2
GLOVE ECLIPSE 7.5 STRL STRAW (GLOVE) ×2 IMPLANT
GLOVE SS BIOGEL STRL SZ 8 (GLOVE) IMPLANT
GLOVE SUPERSENSE BIOGEL SZ 8 (GLOVE) ×2
GOWN STRL REUS W/ TWL LRG LVL3 (GOWN DISPOSABLE) ×3 IMPLANT
GOWN STRL REUS W/TWL LRG LVL3 (GOWN DISPOSABLE) ×9
KIT BASIN OR (CUSTOM PROCEDURE TRAY) ×3 IMPLANT
KIT ROOM TURNOVER OR (KITS) ×3 IMPLANT
NS IRRIG 1000ML POUR BTL (IV SOLUTION) ×3 IMPLANT
PACK GENERAL/GYN (CUSTOM PROCEDURE TRAY) ×3 IMPLANT
PAD ARMBOARD 7.5X6 YLW CONV (MISCELLANEOUS) ×6 IMPLANT
STAPLER VISISTAT (STAPLE) ×5 IMPLANT
STOCKINETTE IMPERVIOUS LG (DRAPES) ×3 IMPLANT
SUT ETHILON 3 0 PS 1 (SUTURE) IMPLANT
SUT SILK 0 TIES 10X30 (SUTURE) ×3 IMPLANT
SUT SILK 2 0 (SUTURE) ×3
SUT SILK 2 0 SH CR/8 (SUTURE) ×3 IMPLANT
SUT SILK 2-0 18XBRD TIE 12 (SUTURE) ×1 IMPLANT
SUT SILK 3 0 (SUTURE) ×3
SUT SILK 3-0 18XBRD TIE 12 (SUTURE) ×1 IMPLANT
SUT VIC AB 2-0 CT1 18 (SUTURE) ×5 IMPLANT
TOWEL GREEN STERILE (TOWEL DISPOSABLE) ×3 IMPLANT
UNDERPAD 30X30 (UNDERPADS AND DIAPERS) ×3 IMPLANT
WATER STERILE IRR 1000ML POUR (IV SOLUTION) ×3 IMPLANT

## 2017-04-13 NOTE — Progress Notes (Signed)
Pharmacy Antibiotic Note  Ellie LunchFred M Arts is a 83 y.o. male admitted on 04/10/2017 with osteo bacteremia BKA today  Plan: Cefepime 1g IV Q12h Vancomycin 1g IV Q12h Flagyl 500mg  IV Q8h VT friday  Height: 5\' 11"  (180.3 cm) Weight: 185 lb (83.9 kg) IBW/kg (Calculated) : 75.3  Temp (24hrs), Avg:98.2 F (36.8 C), Min:98 F (36.7 C), Max:98.4 F (36.9 C)   Recent Labs Lab 04/10/17 0907 04/10/17 0939 04/10/17 1225 04/11/17 1118 04/12/17 0442 04/13/17 0458  WBC 10.3  --   --  6.1 6.4 5.1  CREATININE 1.07  --   --  0.94 1.19 1.20  LATICACIDVEN  --  1.49 0.84  --   --   --     Estimated Creatinine Clearance: 50.5 mL/min (by C-G formula based on SCr of 1.2 mg/dL).    Allergies  Allergen Reactions  . Sulfonamide Derivatives Hives  . Tramadol Itching   Isaac BlissMichael Deztinee Lohmeyer, PharmD, BCPS, BCCCP Clinical Pharmacist Clinical phone for 04/13/2017 from 7a-3:30p: [phone] If after 3:30p, please call main pharmacy at: [phone] 04/13/2017 8:27 AM

## 2017-04-13 NOTE — H&P (View-Only) (Signed)
   VASCULAR SURGERY ASSESSMENT & PLAN:   Scheduled for Right BKA tomorrow. He feels strongly about having a BKA and not an AKA. I have explained that there is a 15-20% of not healing a BKA. My main concern is the wound on his anterior lateral right leg and the chronic venous insufficiency of the right leg. These could significantly affect his chance for healing.  INR is down to 1.73  Cellulitis left leg has significantly improved on Cefepime, Flagyl, and Vanco.    SUBJECTIVE:   Once the dressing off of his foot because it is wet.  PHYSICAL EXAM:   Vitals:   04/11/17 1500 04/11/17 2150 04/12/17 0149 04/12/17 0450  BP: 118/60 119/77  107/60  Pulse: 68 73  (!) 59  Resp: 16 16  16  Temp: 99.4 F (37.4 C) 98 F (36.7 C)  97.9 F (36.6 C)  TempSrc: Oral Oral  Oral  SpO2: 98% 92% 100% 93%  Weight:      Height:       Gangrenous changes in the right foot are stable. Cellulitis has significantly improved in both lower extremities.  LABS:   Lab Results  Component Value Date   WBC 6.1 04/11/2017   HGB 8.5 (L) 04/11/2017   HCT 27.0 (L) 04/11/2017   MCV 87.1 04/11/2017   PLT 153 04/11/2017   Lab Results  Component Value Date   CREATININE 1.19 04/12/2017   Lab Results  Component Value Date   INR 1.73 04/12/2017   CBG (last 3)  No results for input(s): GLUCAP in the last 72 hours.  PROBLEM LIST:    Principal Problem:   Sepsis (HCC) Active Problems:   COPD (chronic obstructive pulmonary disease) (HCC)   ESOPHAGEAL MOTILITY DISORDER   GERD   Dysphagia   Essential hypertension, benign   Chronic pain syndrome   AF (paroxysmal atrial fibrillation) (HCC)   Osteomyelitis (HCC)   DVT (deep venous thrombosis) (HCC)   Fever   Hyperglycemia   Warfarin-induced coagulopathy (HCC)   Elevated INR   Peripheral vascular disease (HCC)   Gangrene of toe of right foot (HCC)   CURRENT MEDS:   . escitalopram  10 mg Oral Daily  . feeding supplement (ENSURE ENLIVE)  237 mL Oral  BID BM  . verapamil  120 mg Oral QHS    Christopher Dickson Beeper: [phone] Office: [phone] 04/12/2017  

## 2017-04-13 NOTE — Interval H&P Note (Signed)
History and Physical Interval Note:  04/13/2017 10:44 AM  [name]  has presented today for surgery, with the diagnosis of Right foot osteomyelitis  The various methods of treatment have been discussed with the patient and family. After consideration of risks, benefits and other options for treatment, the patient has consented to  Procedure(s): RIGHT AMPUTATION BELOW KNEE POSSIBLE AKA (Right) as a surgical intervention .  The patient's history has been reviewed, patient examined, no change in status, stable for surgery.  I have reviewed the patient's chart and labs.  Questions were answered to the patient's satisfaction.     Waverly Ferrariickson, Christopher

## 2017-04-13 NOTE — Progress Notes (Signed)
ANTICOAGULATION CONSULT NOTE Pharmacy Consult for Heparin Indication: atrial fibrillation  Allergies  Allergen Reactions  . Sulfonamide Derivatives Hives  . Tramadol Itching   Patient Measurements: Height: 5\' 11"  (180.3 cm) Weight: 185 lb (83.9 kg) IBW/kg (Calculated) : 75.3  Vital Signs: Temp: 98.4 F (36.9 C) (10/18 0602) Temp Source: Oral (10/18 0602) BP: 118/65 (10/18 0602) Pulse Rate: 65 (10/18 0602)  Labs:  Recent Labs  04/11/17 1118 04/12/17 0442 04/12/17 1602 04/13/17 0458  HGB 8.5* 8.2*  --  7.8*  HCT 27.0* 25.8*  --  24.7*  PLT 153 162  --  143*  LABPROT 24.0* 20.1*  --  17.2*  INR 2.17 1.73  --  1.42  HEPARINUNFRC  --   --  0.11* 0.20*  CREATININE 0.94 1.19  --  1.20    Estimated Creatinine Clearance: 50.5 mL/min (by C-G formula based on SCr of 1.2 mg/dL).  Assessment: 83 y.o. male with h/o Afib, Coumadin on hold, continues on heparin awaiting amputation 10/18   Goal of Therapy:  Heparin level 0.3-0.7 Monitor platelets by anticoagulation protocol: Yes   Plan:  Hold heparin for surgery per Dr Edilia Boickson F/U Restart post OR  Isaac BlissMichael Reyansh Kushnir, PharmD, BCPS, BCCCP Clinical Pharmacist Clinical phone for 04/13/2017 from 7a-3:30p: [phone] If after 3:30p, please call main pharmacy at: [phone] 04/13/2017 8:28 AM

## 2017-04-13 NOTE — Anesthesia Postprocedure Evaluation (Signed)
Anesthesia Post Note  Patient: Sharlyne CaiFred M Janice  Procedure(s) Performed: RIGHT AMPUTATION BELOW KNEE (Right )     Patient location during evaluation: PACU Anesthesia Type: General Level of consciousness: awake and alert Pain management: pain level controlled Vital Signs Assessment: post-procedure vital signs reviewed and stable Respiratory status: spontaneous breathing, nonlabored ventilation, respiratory function stable and patient connected to nasal cannula oxygen Cardiovascular status: blood pressure returned to baseline and stable Postop Assessment: no apparent nausea or vomiting Anesthetic complications: no    Last Vitals:  Vitals:   04/13/17 1330 04/13/17 1437  BP: 109/65 (!) 114/54  Pulse: 63   Resp: 20 16  Temp: (!) 36.3 C (!) 36.4 C  SpO2: 98% 97%    Last Pain:  Vitals:   04/13/17 1600  TempSrc:   PainSc: 10-Worst pain ever                 Penina Reisner S

## 2017-04-13 NOTE — Transfer of Care (Signed)
  Immediate Anesthesia Transfer of Care Note  Patient: [name] [name]  Procedure(s) Performed: RIGHT AMPUTATION BELOW KNEE (Right )  Patient Location: PACU  Anesthesia Type:General  Level of Consciousness: awake, alert , oriented and sedated  Airway & Oxygen Therapy: Patient Spontanous Breathing and Patient connected to nasal cannula oxygen  Post-op Assessment: Report given to RN, Post -op Vital signs reviewed and stable and Patient moving all extremities  Post vital signs: Reviewed and stable  Last Vitals:  Vitals:   04/12/17 2102 04/13/17 0602  BP: (!) 110/56 118/65  Pulse: 68 65  Resp: 18 14  Temp: 36.7 C 36.9 C  SpO2: 93% 92%    Last Pain:  Vitals:   04/13/17 0800  TempSrc:   PainSc: 2       Patients Stated Pain Goal: 1 (04/12/17 2128)  Complications: No apparent anesthesia complications

## 2017-04-13 NOTE — Progress Notes (Signed)
ANTICOAGULATION CONSULT NOTE Pharmacy Consult for Heparin Indication: atrial fibrillation  Allergies  Allergen Reactions  . Sulfonamide Derivatives Hives  . Tramadol Itching   Patient Measurements: Height: 5\' 11"  (180.3 cm) Weight: 185 lb (83.9 kg) IBW/kg (Calculated) : 75.3  Vital Signs: Temp: 98.4 F (36.9 C) (10/18 0602) Temp Source: Oral (10/18 0602) BP: 118/65 (10/18 0602) Pulse Rate: 65 (10/18 0602)  Labs:  Recent Labs  04/11/17 1118 04/12/17 0442 04/12/17 1602 04/13/17 0458  HGB 8.5* 8.2*  --  7.8*  HCT 27.0* 25.8*  --  24.7*  PLT 153 162  --  143*  LABPROT 24.0* 20.1*  --  17.2*  INR 2.17 1.73  --  1.42  HEPARINUNFRC  --   --  0.11* 0.20*  CREATININE 0.94 1.19  --  1.20    Estimated Creatinine Clearance: 50.5 mL/min (by C-G formula based on SCr of 1.2 mg/dL).  Assessment: 83 y.o. male with h/o Afib, Coumadin on hold  S/p amputation 10/18 am  Goal of Therapy:  Heparin level 0.3-0.7 Monitor platelets by anticoagulation protocol: Yes   Plan:  Resume heparin 1500 units/hr at 2100 Daily HL, CBC, INR Slowly begin warfarin dosing 10/19  Isaac BlissMichael Sharina Petre, PharmD, BCPS, BCCCP Clinical Pharmacist Clinical phone for 04/13/2017 from 7a-3:30p: [phone] If after 3:30p, please call main pharmacy at: [phone] 04/13/2017 12:57 PM

## 2017-04-13 NOTE — Anesthesia Procedure Notes (Signed)
Procedure Name: Intubation Date/Time: 04/13/2017 11:28 AM Performed by: Scheryl Darter Pre-anesthesia Checklist: Patient identified, Emergency Drugs available, Suction available and Patient being monitored Patient Re-evaluated:Patient Re-evaluated prior to induction Oxygen Delivery Method: Circle System Utilized Preoxygenation: Pre-oxygenation with 100% oxygen Induction Type: IV induction Ventilation: Mask ventilation without difficulty Laryngoscope Size: Mac and 3 Tube type: Oral Tube size: 7.5 mm Number of attempts: 1 Airway Equipment and Method: Stylet and Oral airway Placement Confirmation: ETT inserted through vocal cords under direct vision,  positive ETCO2 and breath sounds checked- equal and bilateral Tube secured with: Tape Dental Injury: Teeth and Oropharynx as per pre-operative assessment

## 2017-04-13 NOTE — Progress Notes (Signed)
ANTICOAGULATION CONSULT NOTE Pharmacy Consult for Heparin Indication: atrial fibrillation  Allergies  Allergen Reactions  . Sulfonamide Derivatives Hives  . Tramadol Itching   Patient Measurements: Height: 5\' 11"  (180.3 cm) Weight: 185 lb (83.9 kg) IBW/kg (Calculated) : 75.3  Vital Signs: Temp: 98 F (36.7 C) (10/17 2102) Temp Source: Oral (10/17 2102) BP: 110/56 (10/17 2102) Pulse Rate: 68 (10/17 2102)  Labs:  Recent Labs  04/10/17 0907 04/11/17 1118 04/12/17 0442 04/12/17 1602 04/13/17 0458  HGB 9.4* 8.5* 8.2*  --  7.8*  HCT 30.6* 27.0* 25.8*  --  24.7*  PLT 250 153 162  --  143*  LABPROT 62.8* 24.0* 20.1*  --  17.2*  INR 7.44* 2.17 1.73  --  1.42  HEPARINUNFRC  --   --   --  0.11* 0.20*  CREATININE 1.07 0.94 1.19  --   --     Estimated Creatinine Clearance: 51 mL/min (by C-G formula based on SCr of 1.19 mg/dL).  Assessment: 83 y.o. male with h/o Afib, Coumadin on hold, continues on heparin awaiting amputation 10/18  Heparin level low at 0.20, no infusion issues per RN. CBC low-stable, no s/s bleeding documented.   Goal of Therapy:  Heparin level 0.3-0.7 Monitor platelets by anticoagulation protocol: Yes   Plan:  Increase heparin gtt to 1500 units/hr Heparin level in 8 hrs Daily heparin level and CBC Monitor for s/s bleeding F/u pre/post surgical plans   Einar CrowKatherine Weigle, PharmD Clinical Pharmacist 04/13/17 6:01 AM

## 2017-04-13 NOTE — Progress Notes (Signed)
PROGRESS NOTE    [name] [name]  NFA:[id number] DOB: 1933-06-12 DOA: 04/10/2017 PCP: Merlyn Albert, MD   Brief Narrative:  62 wm with Severe peripheral vascular disease 09/2015-aortogram = bilateral popliteal occlusions without reconstructable tibial disease-recommended BKA, known chronic osteomyelitis--treated during 11/16/16 admission, Chronic pain, chronic Anemia, Chronic dysphagia-EGD 10/01/15-pill-induced-lost to follow-up and did not go to [facility], DVT complicated by prior subdural hematoma, Chronic A. Fib [place] score >4, COPD not on home oxygen, Previous lumbar fractures L3, L4 in 2013 status post kyphoplasty Pulmonary hypertension based on cardiac cath10/2018 Represented to [facility] emergency room 10/15 worsening lower extremity wounds Admitting physician transfer patient to [facility] for consideration of operative management per vascular surgery    Assessment & Plan:   Sepsis due to foot gangrene and severe PVD - 09/2015-aortogram = bilateral popliteal occlusions without reconstructable tibial disease-recommended BKA, known chronic osteomyelitis--treated during 11/16/16 admission -improving, sepsis physiology resolved,  -continue empiric antibiotic coverage using vancomycin, cefepime and metronidazole -appreciate vascular surgery consultation plan noted for right below-knee amputation today, play percent risk of nonhealing and requiring further surgery  MRSA and Proteus bacteremia -Secondary to gangrenous foot -Continue antibiotics per infectious disease recommendations -2-D echocardiogram without vegetations -Case discussed with Dr.Kadakia from cardiology again regarding TEE, he felt that due to patient's history of esophageal strictures and narrow esophagitis and good imaging based on TTE would not be advisable to proceed with a TEE at this time, will likely need 4 week antibiotic therapy based on this  Chronic atrial fibrillation -[place] score >4, Coumadin on  hold, -Was on a heparin drip for bridging during this time which is currently held for OR  History of DVT on Coumadin -see above, heparin drip to be resumed tonight  HTN/Pulmonary hypertension -Lasix on hold, continue verapamil -Appears euvolemic at this time  COPD not on home O2 -Stable, albuterol when necessary  Chronic reflux and dysphagia -resume PPI  Code status: Full Code Family: d/w wife and dtr at bedside Dispo: SNF when stable   Consultants:   Vascular surgery  Procedures:   None yet  Antimicrobials:   VANC/cefepime, flagyl   Subjective:  -has more questions abt surgery, reports long h/o dysphagia  Objective: Vitals:   04/12/17 0450 04/12/17 1500 04/12/17 2102 04/13/17 0602  BP: 107/60 100/62 (!) 110/56 118/65  Pulse: (!) 59 69 68 65  Resp: 16 16 18 14   Temp: 97.9 F (36.6 C) 98.1 F (36.7 C) 98 F (36.7 C) 98.4 F (36.9 C)  TempSrc: Oral Oral Oral Oral  SpO2: 93% 90% 93% 92%  Weight:      Height:        Intake/Output Summary (Last 24 hours) at 04/13/17 1250 Last data filed at 04/13/17 0603  Gross per 24 hour  Intake          2333.77 ml  Output             1300 ml  Net          1033.77 ml   Filed Weights   04/10/17 0904 04/10/17 0933  Weight: 86.2 kg (190 lb) 83.9 kg (185 lb)    Examination:Gen: Awake, Alert, Oriented X 2, chronically ill appearing HEENT: PERRLA, Neck supple, no JVD Lungs: Good air movement bilaterally, CTAB CVS: RRR,No Gallops,Rubs or new Murmurs Abd: soft, Non tender, non distended, BS present Extremities: gangrenous changes in both feet, scattered ulcers involving the anterior tibia, right great toe with necrotic appearance, macerated edges and foul smell, tender  to palpation dorsalis pedis pulse unable to feel at this time Skin: no new rashes    Data Reviewed: I have personally reviewed following labs and imaging studies  CBC:  Recent Labs Lab 04/10/17 0907 04/11/17 1118 04/12/17 0442 04/13/17 0458    WBC 10.3 6.1 6.4 5.1  NEUTROABS 9.7* 5.4 5.8 4.0  HGB 9.4* 8.5* 8.2* 7.8*  HCT 30.6* 27.0* 25.8* 24.7*  MCV 89.0 87.1 86.6 84.6  PLT 250 153 162 143*   Basic Metabolic Panel:  Recent Labs Lab 04/10/17 0907 04/11/17 1118 04/12/17 0442 04/13/17 0458  NA 135 135 131* 131*  K 3.8 4.1 3.5 3.2*  CL 102 107 102 100*  CO2 20* 19* 18* 21*  GLUCOSE 141* 126* 131* 111*  BUN 34* 24* 30* 36*  CREATININE 1.07 0.94 1.19 1.20  CALCIUM 8.7* 8.1* 8.2* 7.9*   GFR: Estimated Creatinine Clearance: 50.5 mL/min (by C-G formula based on SCr of 1.2 mg/dL). Liver Function Tests:  Recent Labs Lab 04/10/17 0907 04/11/17 1118 04/12/17 0442 04/13/17 0458  AST 18 37 38 47*  ALT 10* 13* 17 22  ALKPHOS 98 74 79 72  BILITOT 0.8 0.9 0.5 0.6  PROT 6.8 5.3* 5.5* 5.2*  ALBUMIN 2.8* 2.0* 2.1* 1.9*   No results for input(s): LIPASE, AMYLASE in the last 168 hours. No results for input(s): AMMONIA in the last 168 hours. Coagulation Profile:  Recent Labs Lab 04/10/17 0907 04/11/17 1118 04/12/17 0442 04/13/17 0458  INR 7.44* 2.17 1.73 1.42   Cardiac Enzymes: No results for input(s): CKTOTAL, CKMB, CKMBINDEX, TROPONINI in the last 168 hours. BNP (last 3 results) No results for input(s): PROBNP in the last 8760 hours. HbA1C:  Recent Labs  04/10/17 1812  HGBA1C 5.8*   CBG: No results for input(s): GLUCAP in the last 168 hours. Lipid Profile: No results for input(s): CHOL, HDL, LDLCALC, TRIG, CHOLHDL, LDLDIRECT in the last 72 hours. Thyroid Function Tests: No results for input(s): TSH, T4TOTAL, FREET4, T3FREE, THYROIDAB in the last 72 hours. Anemia Panel: No results for input(s): VITAMINB12, FOLATE, FERRITIN, TIBC, IRON, RETICCTPCT in the last 72 hours. Urine analysis:    Component Value Date/Time   COLORURINE YELLOW 04/10/2017 0907   APPEARANCEUR HAZY (A) 04/10/2017 0907   LABSPEC 1.028 04/10/2017 0907   PHURINE 5.0 04/10/2017 0907   GLUCOSEU NEGATIVE 04/10/2017 0907   HGBUR  MODERATE (A) 04/10/2017 0907   BILIRUBINUR NEGATIVE 04/10/2017 0907   KETONESUR 5 (A) 04/10/2017 0907   PROTEINUR 30 (A) 04/10/2017 0907   UROBILINOGEN 0.2 02/21/2011 1842   NITRITE NEGATIVE 04/10/2017 0907   LEUKOCYTESUR NEGATIVE 04/10/2017 [id number]     Radiology Studies: Reviewed images personally in health database    Scheduled Meds: . [MAR Hold] escitalopram  10 mg Oral Daily  . [MAR Hold] feeding supplement (ENSURE ENLIVE)  237 mL Oral BID BM  . [MAR Hold] verapamil  120 mg Oral QHS   Continuous Infusions: . [MAR Hold] ceFEPime (MAXIPIME) IV 1 g (04/13/17 0845)  . [MAR Hold] metronidazole 500 mg (04/13/17 0945)  . [MAR Hold] vancomycin 0 mg (04/12/17 2226)     LOS: 3 days    Time spent: 35min  Millard Fillmore Suburban HospitalJOSEPH,Aniyla Harling  [facility] Page via [email or web address], password [email or web address]   If 7PM-7AM, please contact night-coverage [email or web address] Password [email or web address] 04/13/2017, 12:50 PM

## 2017-04-13 NOTE — Op Note (Signed)
    NAME: [name] [name]    MRN: [id number] DOB: 06/27/1933    DATE OF OPERATION: 04/13/2017  PREOP DIAGNOSIS:    Gangrene right foot.   POSTOP DIAGNOSIS:    Same  PROCEDURE:    Right AKA  SURGEON: Christopher S. Edilia Boickson, MD, FACS  ASSIST: Debbora PrestoSteve Eureste, SA  ANESTHESIA: Gen   EBL: min  INDICATIONS:    [name] LunchFred M Gottsch is a 83 y.o. male with gangrenous wounds of his right leg. He is undergone a previous arteriogram which showed that he had no options for revasculan on the right. He presents for right below the knee amputation.  FINDINGS:    The muscle appeared adequately perfused.  TECHNIQUE:    The patient was taken to the operating room and received a general anesthetic. A tourniquet was placed on the upper thigh. The right leg was prepped and draped in the usual sterile fashion. The circumference of the right leg was measured 10 cm distal to the tibial tuberosity. Two thirds of this distance was used to mark the anterior skin incision. A long posterior incision of equal length was marked. The leg was exsanguinated with an Eschmarch bandage and the tourniquet inflated to 300 mmHg. Under tourniquet control, the incision was carried down to the skin, subcutaneous tissue, fascia and muscle to the tibia and fibula which were dissected free circumferentially. The periosteum was elevated and the bone divided proximal to the level of skin division. The anterior aspect of the tibia was beveled.The arteries and veins were individually suture ligated with 2-0 silk ties.The tourniquet was then released. Additional hemostasis was obtained using electrocautery and 2-0 silk ties. The wound was irrigated with copious amounts of saline and the edges of the bone were rasped.The fascia was closed with interrupted 2-0 V The skin was closed with staples. Sterile dressing was applied. The patient tolerated the procedure well and was transferred to the recovery room in stable condition. All needle and sponge counts  were correct.  Waverly Ferrarihristopher Dickson, MD, FACS [facility]  DATE OF DICTATION:   04/13/2017

## 2017-04-13 NOTE — Progress Notes (Signed)
Pt.  is scheduled for surgery today at 0945 but no pre operative orders, nurse made Pt, NPO during the midnight, waiting for orders, and  will continue to monitor Pt. Thanks!

## 2017-04-13 NOTE — Anesthesia Preprocedure Evaluation (Addendum)
Anesthesia Evaluation  Patient identified by MRN, date of birth, ID band Patient awake    Reviewed: Allergy & Precautions, NPO status , Patient's Chart, lab work & pertinent test results  Airway Mallampati: II  TM Distance: >3 FB Neck ROM: Full    Dental no notable dental hx.    Pulmonary COPD,    Pulmonary exam normal breath sounds clear to auscultation       Cardiovascular hypertension, + Peripheral Vascular Disease  Normal cardiovascular exam+ dysrhythmias Atrial Fibrillation  Rhythm:Regular Rate:Normal  Left ventricle: The cavity size was normal. There was mild   concentric hypertrophy. Systolic function was mildly reduced. The   estimated ejection fraction was in the range of 45% to 50%. There   is mild hypokinesis of the entire myocardium. The study is not   technically sufficient to allow evaluation of LV diastolic   function. - Aortic valve: Cusp separation was mildly reduced. Noncoronary   cusp mobility was moderately restricted. There was mild stenosis.   There was mild regurgitation. - Mitral valve: Calcified annulus. There was mild regurgitation. - Left atrium: The atrium was moderately dilated. - Right ventricle: The cavity size was mildly dilated. Wall   thickness was normal. Systolic function was mildly reduced. - Right atrium: The atrium was moderately dilated. - Pulmonary arteries: Systolic pressure was mildly increased. PA   peak pressure: 42 mm Hg (S). - Pericardium, extracardiac: A trivial pericardial effusion was   identified posterior to the heart.  Impressions:  - There was no evidence of a vegetation.   Neuro/Psych negative neurological ROS  negative psych ROS   GI/Hepatic Neg liver ROS, GERD  ,  Endo/Other  negative endocrine ROS  Renal/GU negative Renal ROS  negative genitourinary   Musculoskeletal negative musculoskeletal ROS (+)   Abdominal   Peds negative pediatric ROS (+)   Hematology  (+) anemia ,   Anesthesia Other Findings   Reproductive/Obstetrics negative OB ROS                            Anesthesia Physical Anesthesia Plan  ASA: IV  Anesthesia Plan: General   Post-op Pain Management:    Induction: Intravenous  PONV Risk Score and Plan: 1 and Ondansetron, Dexamethasone and Treatment may vary due to age or medical condition  Airway Management Planned: Oral ETT and LMA  Additional Equipment:   Intra-op Plan:   Post-operative Plan: Extubation in OR  Informed Consent: I have reviewed the patients History and Physical, chart, labs and discussed the procedure including the risks, benefits and alternatives for the proposed anesthesia with the patient or authorized representative who has indicated his/her understanding and acceptance.   Dental advisory given  Plan Discussed with: CRNA and Surgeon  Anesthesia Plan Comments: (Etomidate for induction)       Anesthesia Quick Evaluation

## 2017-04-13 NOTE — Progress Notes (Signed)
RN received call from CCMD stating that the patient was having some pauses lasting about 2.1 seconds. She stated that it did not look serious but wanted to make me aware. Patient has been assessed and is in the room with family. Will call MD if RN becomes concerned.

## 2017-04-13 NOTE — Progress Notes (Signed)
    [facility]    Date of Admission:  04/10/2017   Total days of antibiotics 4           ID: [name] [name] is a 83 y.o. male with PVD and  right  Gangrenous foot c/b MRSA and proteus bacteremia Principal Problem:   Sepsis (HCC) Active Problems:   COPD (chronic obstructive pulmonary disease) (HCC)   ESOPHAGEAL MOTILITY DISORDER   GERD   Dysphagia   Essential hypertension, benign   Chronic pain syndrome   AF (paroxysmal atrial fibrillation) (HCC)   Osteomyelitis (HCC)   DVT (deep venous thrombosis) (HCC)   Fever   Hyperglycemia   Warfarin-induced coagulopathy (HCC)   Elevated INR   Peripheral vascular disease (HCC)   Gangrene of toe of right foot (HCC)    Subjective: Underwent right BKA this morning. Having some associated pain " I feel rough" denies fevers  Medications:  . [START ON 04/14/2017] docusate sodium  100 mg Oral Daily  . escitalopram  10 mg Oral Daily  . feeding supplement (ENSURE ENLIVE)  237 mL Oral BID BM  . pantoprazole  40 mg Oral Daily  . verapamil  120 mg Oral QHS    Objective: Vital signs in last 24 hours: Temp:  [97.3 F (36.3 C)-98.4 F (36.9 C)] 97.5 F (36.4 C) (10/18 1437) Pulse Rate:  [30-68] 63 (10/18 1330) Resp:  [14-27] 16 (10/18 1437) BP: (109-125)/(54-65) 114/54 (10/18 1437) SpO2:  [85 %-98 %] 97 % (10/18 1437) Physical Exam  Constitutional: He is oriented to person, place, and time. He appears chronically ill. No distress. cluching his bible HENT:  Mouth/Throat: Oropharynx is dry Cardiovascular: Normal rate, regular rhythm and normal heart sounds. Exam reveals no gallop and no friction rub.  No murmur heard.  Pulmonary/Chest: Effort normal and breath sounds normal. No respiratory distress. He has no wheezes.  Ext: right BKA is wrapped from surgery    Lab Results  Recent Labs  04/12/17 0442 04/13/17 0458  WBC 6.4 5.1  HGB 8.2* 7.8*  HCT 25.8* 24.7*  NA 131* 131*  K 3.5 3.2*  CL 102 100*  CO2  18* 21*  BUN 30* 36*  CREATININE 1.19 1.20   Liver Panel  Recent Labs  04/12/17 0442 04/13/17 0458  PROT 5.5* 5.2*  ALBUMIN 2.1* 1.9*  AST 38 47*  ALT 17 22  ALKPHOS 79 72  BILITOT 0.5 0.6   Lab Results  Component Value Date   ESRSEDRATE 105 (H) 04/10/2017   Lab Results  Component Value Date   CRP 30.4 (H) 04/10/2017    Microbiology: 10/15 blood cx mrsa 10/15 blood cx proteus-pan sensitive 10/16 blood cx NGTD Studies/Results: No results found. TTE no vegetation  Assessment/Plan: Gangrenous foot with secondary bacteremia with MRSA and proteus POD#0 s/p BKA  Polymicrobial bacteremia = plan for 2 wk with ceftriaxone 2gm IV daily using 10/18 as day 1  MRSA bacteremia = patient has difficulty swallowing and unable to get TEE to see if any signs of endocarditis. Will plan to treat as complicated bacteremia. Plan to treat with 4 weeks of iv abtx. For now keep on vancomycin.  Drue SecondSNIDER, [facility] Cell: [phone] Pager: [phone]  04/13/2017, 6:25 PM

## 2017-04-14 ENCOUNTER — Encounter (HOSPITAL_COMMUNITY): Payer: Self-pay | Admitting: Vascular Surgery

## 2017-04-14 DIAGNOSIS — Z89611 Acquired absence of right leg above knee: Secondary | ICD-10-CM

## 2017-04-14 LAB — HEPARIN LEVEL (UNFRACTIONATED)
Heparin Unfractionated: 0.17 IU/mL — ABNORMAL LOW (ref 0.30–0.70)
Heparin Unfractionated: 0.39 IU/mL (ref 0.30–0.70)

## 2017-04-14 LAB — COMPREHENSIVE METABOLIC PANEL
ALT: 22 U/L (ref 17–63)
AST: 54 U/L — ABNORMAL HIGH (ref 15–41)
Albumin: 1.9 g/dL — ABNORMAL LOW (ref 3.5–5.0)
Alkaline Phosphatase: 70 U/L (ref 38–126)
Anion gap: 11 (ref 5–15)
BILIRUBIN TOTAL: 0.4 mg/dL (ref 0.3–1.2)
BUN: 30 mg/dL — ABNORMAL HIGH (ref 6–20)
CALCIUM: 8 mg/dL — AB (ref 8.9–10.3)
CO2: 22 mmol/L (ref 22–32)
Chloride: 99 mmol/L — ABNORMAL LOW (ref 101–111)
Creatinine, Ser: 1.15 mg/dL (ref 0.61–1.24)
GFR, EST NON AFRICAN AMERICAN: 57 mL/min — AB (ref >60)
Glucose, Bld: 120 mg/dL — ABNORMAL HIGH (ref 65–99)
Potassium: 3.8 mmol/L (ref 3.5–5.1)
Sodium: 132 mmol/L — ABNORMAL LOW (ref 135–145)
TOTAL PROTEIN: 5.1 g/dL — AB (ref 6.5–8.1)

## 2017-04-14 LAB — CBC WITH DIFFERENTIAL/PLATELET
BASOS ABS: 0 10*3/uL (ref 0.0–0.1)
BASOS PCT: 0 %
EOS ABS: 0 10*3/uL (ref 0.0–0.7)
Eosinophils Relative: 0 %
HCT: 25.7 % — ABNORMAL LOW (ref 39.0–52.0)
HEMOGLOBIN: 7.9 g/dL — AB (ref 13.0–17.0)
Lymphocytes Relative: 8 %
Lymphs Abs: 0.7 10*3/uL (ref 0.7–4.0)
MCH: 26.5 pg (ref 26.0–34.0)
MCHC: 30.7 g/dL (ref 30.0–36.0)
MCV: 86.2 fL (ref 78.0–100.0)
Monocytes Absolute: 0.6 10*3/uL (ref 0.1–1.0)
Monocytes Relative: 8 %
NEUTROS ABS: 6.8 10*3/uL (ref 1.7–7.7)
NEUTROS PCT: 84 %
Platelets: 168 10*3/uL (ref 150–400)
RBC: 2.98 MIL/uL — ABNORMAL LOW (ref 4.22–5.81)
RDW: 17.4 % — AB (ref 11.5–15.5)
WBC: 8.1 10*3/uL (ref 4.0–10.5)

## 2017-04-14 LAB — VANCOMYCIN, TROUGH: Vancomycin Tr: 32 ug/mL (ref 15–20)

## 2017-04-14 LAB — PROTIME-INR
INR: 1.45
PROTHROMBIN TIME: 17.5 s — AB (ref 11.4–15.2)

## 2017-04-14 LAB — ABO/RH: ABO/RH(D): A POS

## 2017-04-14 MED ORDER — SALINE SPRAY 0.65 % NA SOLN
1.0000 | NASAL | Status: DC | PRN
  Administered 2017-04-14: 1 via NASAL
  Filled 2017-04-14: qty 44

## 2017-04-14 MED ORDER — WARFARIN SODIUM 5 MG PO TABS
5.0000 mg | ORAL_TABLET | Freq: Once | ORAL | Status: AC
  Administered 2017-04-14: 5 mg via ORAL
  Filled 2017-04-14: qty 1

## 2017-04-14 MED ORDER — WARFARIN - PHARMACIST DOSING INPATIENT: Freq: Every day | Status: DC

## 2017-04-14 MED ORDER — VANCOMYCIN HCL IN DEXTROSE 1-5 GM/200ML-% IV SOLN
1000.0000 mg | INTRAVENOUS | Status: DC
  Administered 2017-04-15 – 2017-04-16 (×2): 1000 mg via INTRAVENOUS
  Filled 2017-04-14 (×2): qty 200

## 2017-04-14 MED ORDER — SENNOSIDES-DOCUSATE SODIUM 8.6-50 MG PO TABS
1.0000 | ORAL_TABLET | Freq: Two times a day (BID) | ORAL | Status: DC
  Administered 2017-04-14 – 2017-04-18 (×9): 1 via ORAL
  Filled 2017-04-14 (×9): qty 1

## 2017-04-14 MED ORDER — FUROSEMIDE 10 MG/ML IJ SOLN
20.0000 mg | Freq: Once | INTRAMUSCULAR | Status: AC
  Administered 2017-04-14: 20 mg via INTRAVENOUS
  Filled 2017-04-14: qty 2

## 2017-04-14 NOTE — Progress Notes (Signed)
   VASCULAR SURGERY ASSESSMENT & PLAN:   1 Day Post-Op s/p: Right BKA  Pain adequately controlled.   I have encouraged him to keep his right knee straight otherwise he could get a pressure sore on his BKA.  Dressing change tomorrow.  SUBJECTIVE:   Moderate discomfort  PHYSICAL EXAM:   Vitals:   04/13/17 1437 04/13/17 2222 04/14/17 0141 04/14/17 0352  BP: (!) 114/54 (!) 118/54  (!) 109/48  Pulse:  80  89  Resp: 16 15    Temp: (!) 97.5 F (36.4 C) 98.9 F (37.2 C)  99.5 F (37.5 C)  TempSrc: Oral Axillary  Axillary  SpO2: 97% 98% 95% 91%  Weight:      Height:       Dressing on right BKA with mimimal drainage. Wounds on left foot are unchanged.   LABS:   Lab Results  Component Value Date   WBC 8.1 04/14/2017   HGB 7.9 (L) 04/14/2017   HCT 25.7 (L) 04/14/2017   MCV 86.2 04/14/2017   PLT 168 04/14/2017   Lab Results  Component Value Date   CREATININE 1.15 04/14/2017   Lab Results  Component Value Date   INR 1.45 04/14/2017    PROBLEM LIST:    Principal Problem:   Sepsis (HCC) Active Problems:   COPD (chronic obstructive pulmonary disease) (HCC)   ESOPHAGEAL MOTILITY DISORDER   GERD   Dysphagia   Essential hypertension, benign   Chronic pain syndrome   AF (paroxysmal atrial fibrillation) (HCC)   Osteomyelitis (HCC)   DVT (deep venous thrombosis) (HCC)   Fever   Hyperglycemia   Warfarin-induced coagulopathy (HCC)   Elevated INR   Peripheral vascular disease (HCC)   Gangrene of toe of right foot (HCC)   CURRENT MEDS:   . escitalopram  10 mg Oral Daily  . feeding supplement (ENSURE ENLIVE)  237 mL Oral BID BM  . pantoprazole  40 mg Oral Daily  . senna-docusate  1 tablet Oral BID  . verapamil  120 mg Oral QHS    Waverly FerrariChristopher Dickson Beeper: [phone] Office: [phone] 04/14/2017

## 2017-04-14 NOTE — Progress Notes (Signed)
Inpatient Rehabilitation  Met with patient, son, and daughter-in-law to discuss team's recommendation for IP Rehab.  Shared booklets, insurance verification letter, and answered initial questions.  Discussed anticipated need for assist upon discharge and family states that they are taking it day by day and trying to plan.  I will plan to follow up Monday for therapy tolerance and patient/family decision.  Call if questions.  Carmelia Roller., CCC/SLP Admission Coordinator  [place]  Cell [phone]

## 2017-04-14 NOTE — Evaluation (Signed)
Physical Therapy Evaluation Patient Details Name: [name] [name] M Postle MRN: [id number] DOB: 05/27/1933 Today's Date: 04/14/2017   History of Present Illness  83 y.o. male with history of alcohol abuse, atrial fibrillation maintained on Coumadin, HTN, CKD stage III, COPD and peripheral vascular disease. Per chart review patient lives with elderly wife who has dementia. Pt presented 04/10/2017 with ischemic right lower extremity gangrenous changes followed by vascular surgery. Recent aortogram showed bilateral popliteal artery occlusions with unreconstructable tibial disease bilaterally. Underwent right AKA 04/13/2017   Clinical Impression  Patient presents with decreased independence with mobility due to pain, limited activity tolerance, decreased cardiopulmonary endurance, decreased strength and decreased balance.  He will benefit from skilled PT in the acute setting to allow return home with family support following CIR level rehab stay.      Follow Up Recommendations CIR    Equipment Recommendations  Wheelchair (measurements PT);Wheelchair cushion (measurements PT)    Recommendations for Other Services       Precautions / Restrictions Precautions Precautions: Fall Restrictions Weight Bearing Restrictions: Yes Other Position/Activity Restrictions: no formal wt bearing orders however maintained NWB in RLE       Mobility  Bed Mobility Overal bed mobility: Needs Assistance Bed Mobility: Supine to Sit Rolling: Max assist   Supine to sit: Max assist Sit to supine: Max assist;+2 for physical assistance   General bed mobility comments: assist with rail to scoot and lift trunk upright to sit EOB; to supine with +2 A for shoulders and legs, scooted up in bed on bed pad with bed in trendelenberg  Transfers Overall transfer level: Needs assistance Equipment used: Rolling walker (2 wheeled) Transfers: Sit to/from Stand Sit to Stand: Max assist;+2 physical assistance;+2 safety/equipment          General transfer comment: NT due to +2 earlier today and RN not initially available to assist; sat EOB x 10 minutes  Ambulation/Gait                Stairs            Wheelchair Mobility    Modified Rankin (Stroke Patients Only)       Balance Overall balance assessment: Needs assistance Sitting-balance support: Feet supported;Bilateral upper extremity supported Sitting balance-Leahy Scale: Good Sitting balance - Comments: sat EOB no balance issues noted in static sitting   Standing balance support: Bilateral upper extremity supported Standing balance-Leahy Scale: Poor Standing balance comment: reliant on UE support on RW with additional external support from therapist/RN                              Pertinent Vitals/Pain Pain Assessment: Faces Faces Pain Scale: Hurts whole lot Pain Location: RLE with movement  Pain Descriptors / Indicators: Grimacing;Sore;Operative site guarding Pain Intervention(s): Monitored during session;Limited activity within patient's tolerance;Repositioned    Home Living Family/patient expects to be discharged to:: Inpatient rehab                      Prior Function Level of Independence: Independent with assistive device(s)         Comments: was using cane PTA, lives with wife for whom he is caregiver with support from his sons     Hand Dominance        Extremity/Trunk Assessment   Upper Extremity Assessment Upper Extremity Assessment: Generalized weakness    Lower Extremity Assessment Lower Extremity Assessment: RLE deficits/detail;LLE deficits/detail RLE Deficits / Details: R  BKA painful and limited with knee flexed and leg on pillow; work during session and education to extend knee LLE Deficits / Details: AROM WFL, strength grossly 3+/5 limited by pain R Hip per pt; noted skin changes in lower leg with hemosiderin staining, wound on lower leg with bandage        Communication    Communication: No difficulties  Cognition Arousal/Alertness: Awake/alert Behavior During Therapy: WFL for tasks assessed/performed Overall Cognitive Status: Within Functional Limits for tasks assessed Area of Impairment: Safety/judgement;Awareness;Memory;Following commands;Problem solving                     Memory: Decreased short-term memory Following Commands: Follows one step commands with increased time Safety/Judgement: Decreased awareness of deficits;Decreased awareness of safety Awareness: Emergent Problem Solving: Requires verbal cues;Requires tactile cues General Comments: Pt appears to have decreased awareness of deficits, stating all he needs to do is to get up and get into the bathroom; often had to repeat questions as pt would respond with answers not related to question asked      General Comments General comments (skin integrity, edema, etc.): Pt c/o urinary incontinence and evidently condom cath not fitting well enough; pt on 2L O2 throughout session, RN applied water to humidify per pt request.     Exercises General Exercises - Lower Extremity Quad Sets: AAROM;Right;5 reps;Supine Gluteal Sets: AROM;Both;10 reps;Supine Hip ABduction/ADduction: AAROM;Right;5 reps;Supine   Assessment/Plan    PT Assessment Patient needs continued PT services  PT Problem List Decreased strength;Decreased mobility;Decreased range of motion;Decreased activity tolerance;Cardiopulmonary status limiting activity;Decreased balance;Decreased knowledge of use of DME;Pain;Decreased skin integrity       PT Treatment Interventions DME instruction;Therapeutic activities;Patient/family education;Therapeutic exercise;Wheelchair mobility training;Balance training;Functional mobility training    PT Goals (Current goals can be found in the Care Plan section)  Acute Rehab PT Goals Patient Stated Goal: regain independence; less pain  PT Goal Formulation: With patient Time For Goal Achievement:  04/28/17 Potential to Achieve Goals: Good    Frequency Min 3X/week   Barriers to discharge        Co-evaluation               AM-PAC PT "6 Clicks" Daily Activity  Outcome Measure Difficulty turning over in bed (including adjusting bedclothes, sheets and blankets)?: Unable Difficulty moving from lying on back to sitting on the side of the bed? : Unable Difficulty sitting down on and standing up from a chair with arms (e.g., wheelchair, bedside commode, etc,.)?: Unable Help needed moving to and from a bed to chair (including a wheelchair)?: Total Help needed walking in hospital room?: Total Help needed climbing 3-5 steps with a railing? : Total 6 Click Score: 6    End of Session   Activity Tolerance: Patient limited by pain;Patient limited by fatigue Patient left: in bed;with call bell/phone within reach   PT Visit Diagnosis: Pain;Other abnormalities of gait and mobility (R26.89);Muscle weakness (generalized) (M62.81) Pain - Right/Left: Right Pain - part of body: Leg    Time: 1800-1830 PT Time Calculation (min) (ACUTE ONLY): 30 min   Charges:   PT Evaluation $PT Eval Moderate Complexity: 1 Mod PT Treatments $Therapeutic Activity: 8-22 mins   PT G CodesSheran Lawless, [place] [phone] 04/14/2017   Elray Mcgregor 04/14/2017, 7:21 PM

## 2017-04-14 NOTE — Progress Notes (Signed)
PROGRESS NOTE    [name] [name]  ZOX:[id number] DOB: 1933-05-12 DOA: 04/10/2017 PCP: Merlyn Albert, MD   Brief Narrative:  54 wm with Severe peripheral vascular disease 09/2015-aortogram = bilateral popliteal occlusions without reconstructable tibial disease-recommended BKA, known chronic osteomyelitis--treated medically during 11/16/16 admission, Chronic pain, chronic Anemia, Chronic dysphagia-EGD 10/01/15-pill-induced-lost to follow-up and did not go to [facility] for manometry, DVT, Chronic A. Fib [place] score >4, COPD  Presented to [facility] emergency room 10/15 with sepsis and foot gangrene from severe peripheral vascular disease, found to have MRSA and Proteus bacteremia -Vascular surgery and infectious disease following -Underwent right below-knee amputation 10/18  Assessment & Plan:   Sepsis due to foot gangrene and severe PVD - 09/2015-aortogram = bilateral popliteal occlusions without reconstructable tibial disease-recommended BKA, known chronic osteomyelitis--treated during 11/16/16 admission -improving, sepsis physiology resolved,  -continue empiric antibiotic coverage using vancomycin, cefepime and metronidazole -appreciate vascular surgery consultation, s/p  right below-knee amputation 10/18 -monitor CBC, may need a transfusion tomorrow  MRSA and Proteus bacteremia -Secondary to gangrenous foot -Continue IV vancomycin and ceftriaxone per infectious disease recommendations, plan for 2 weeks of ceftriaxone using 10/18 is day 1 and 4 weeks of IV vancomycin -2-D echocardiogram without vegetations -Case discussed with Dr.Kadakia from cardiology again regarding TEE, he felt that due to patient's history of esophageal strictures and narrow esophagitis and good imaging based on TTE would not be advisable to proceed with a TEE at this time, will likely need 4 week antibiotic therapy based on this  Chronic atrial fibrillation -[place] score >4, Coumadin on hold, -On heparin drip and Coumadin  resumed  History of DVT on Coumadin -Coumadin restarted with heparin bridge  HTN/Pulmonary hypertension -continue verapamil -Appears likely volume overloaded today -Give a single dose of IV Lasix x1, resume p.o. Lasix tomorrow  COPD not on home O2 -Stable, albuterol when necessary  Chronic reflux and dysphagia -resume PPI  Code status: Full Code Family: d/w wife and dtr at bedside 10/18 Dispo: CIR vs SNF when stable   Consultants:   Vascular surgery  Procedures:   Right below-knee amputation 10/18 by Dr. Waverly Ferrari  Antimicrobials:   VANC/cefepime, flagyl   Subjective: , Reports some nasal congestion and nonproductive cough, reports pain at the amputation site  Objective: Vitals:   04/13/17 1437 04/13/17 2222 04/14/17 0141 04/14/17 0352  BP: (!) 114/54 (!) 118/54  (!) 109/48  Pulse:  80  89  Resp: 16 15    Temp: (!) 97.5 F (36.4 C) 98.9 F (37.2 C)  99.5 F (37.5 C)  TempSrc: Oral Axillary  Axillary  SpO2: 97% 98% 95% 91%  Weight:      Height:        Intake/Output Summary (Last 24 hours) at 04/14/17 1421 Last data filed at 04/14/17 0630  Gross per 24 hour  Intake          1689.42 ml  Output                0 ml  Net          1689.42 ml   Filed Weights   04/10/17 0904 04/10/17 0933  Weight: 86.2 kg (190 lb) 83.9 kg (185 lb)    Examination:  Gen: Awake, Alert, Oriented X 3, chronically ill-appearing male HEENT: PERRLA, Neck supple, no JVD Lungs: Good air movement bilaterally, CTAB, anterior chest wall deformity CVS: RRR,No Gallops,Rubs or new Murmurs Abd: soft, Non tender, non distended, BS present Extremities: Right below-knee amputation with dressing,  gangrenous changes noted in left foot scattered ulcers in the anterior tibia Skin: no new rashes Neuro: Nonfocal    Data Reviewed: I have personally reviewed following labs and imaging studies  CBC:  Recent Labs Lab 04/10/17 0907 04/11/17 1118 04/12/17 0442 04/13/17 0458  04/14/17 0634  WBC 10.3 6.1 6.4 5.1 8.1  NEUTROABS 9.7* 5.4 5.8 4.0 6.8  HGB 9.4* 8.5* 8.2* 7.8* 7.9*  HCT 30.6* 27.0* 25.8* 24.7* 25.7*  MCV 89.0 87.1 86.6 84.6 86.2  PLT 250 153 162 143* 168   Basic Metabolic Panel:  Recent Labs Lab 04/10/17 0907 04/11/17 1118 04/12/17 0442 04/13/17 0458 04/14/17 0634  NA 135 135 131* 131* 132*  K 3.8 4.1 3.5 3.2* 3.8  CL 102 107 102 100* 99*  CO2 20* 19* 18* 21* 22  GLUCOSE 141* 126* 131* 111* 120*  BUN 34* 24* 30* 36* 30*  CREATININE 1.07 0.94 1.19 1.20 1.15  CALCIUM 8.7* 8.1* 8.2* 7.9* 8.0*   GFR: Estimated Creatinine Clearance: 52.7 mL/min (by C-G formula based on SCr of 1.15 mg/dL). Liver Function Tests:  Recent Labs Lab 04/10/17 0907 04/11/17 1118 04/12/17 0442 04/13/17 0458 04/14/17 0634  AST 18 37 38 47* 54*  ALT 10* 13* 17 22 22   ALKPHOS 98 74 79 72 70  BILITOT 0.8 0.9 0.5 0.6 0.4  PROT 6.8 5.3* 5.5* 5.2* 5.1*  ALBUMIN 2.8* 2.0* 2.1* 1.9* 1.9*   No results for input(s): LIPASE, AMYLASE in the last 168 hours. No results for input(s): AMMONIA in the last 168 hours. Coagulation Profile:  Recent Labs Lab 04/10/17 0907 04/11/17 1118 04/12/17 0442 04/13/17 0458 04/14/17 0634  INR 7.44* 2.17 1.73 1.42 1.45   Cardiac Enzymes: No results for input(s): CKTOTAL, CKMB, CKMBINDEX, TROPONINI in the last 168 hours. BNP (last 3 results) No results for input(s): PROBNP in the last 8760 hours. HbA1C: No results for input(s): HGBA1C in the last 72 hours. CBG: No results for input(s): GLUCAP in the last 168 hours. Lipid Profile: No results for input(s): CHOL, HDL, LDLCALC, TRIG, CHOLHDL, LDLDIRECT in the last 72 hours. Thyroid Function Tests: No results for input(s): TSH, T4TOTAL, FREET4, T3FREE, THYROIDAB in the last 72 hours. Anemia Panel: No results for input(s): VITAMINB12, FOLATE, FERRITIN, TIBC, IRON, RETICCTPCT in the last 72 hours. Urine analysis:    Component Value Date/Time   COLORURINE YELLOW 04/10/2017  0907   APPEARANCEUR HAZY (A) 04/10/2017 0907   LABSPEC 1.028 04/10/2017 0907   PHURINE 5.0 04/10/2017 0907   GLUCOSEU NEGATIVE 04/10/2017 0907   HGBUR MODERATE (A) 04/10/2017 0907   BILIRUBINUR NEGATIVE 04/10/2017 0907   KETONESUR 5 (A) 04/10/2017 0907   PROTEINUR 30 (A) 04/10/2017 0907   UROBILINOGEN 0.2 02/21/2011 1842   NITRITE NEGATIVE 04/10/2017 0907   LEUKOCYTESUR NEGATIVE 04/10/2017 [id number]     Radiology Studies: Reviewed images personally in health database    Scheduled Meds: . escitalopram  10 mg Oral Daily  . feeding supplement (ENSURE ENLIVE)  237 mL Oral BID BM  . pantoprazole  40 mg Oral Daily  . senna-docusate  1 tablet Oral BID  . verapamil  120 mg Oral QHS   Continuous Infusions: . cefTRIAXone (ROCEPHIN)  IV Stopped (04/13/17 2211)  . heparin 1,700 Units/hr (04/14/17 0830)  . magnesium sulfate 1 - 4 g bolus IVPB    . vancomycin Stopped (04/14/17 0939)     LOS: 4 days    Time spent: 35min  Kashawna Manzer  [facility] Page via [email or web address], password [email or web address]   If  7PM-7AM, please contact night-coverage [email or web address] Password [email or web address] 04/14/2017, 2:21 PM

## 2017-04-14 NOTE — Progress Notes (Signed)
Pharmacy Antibiotic Note  [name] [name] is a 83 y.o. male admitted on 04/10/2017 with osteo and MRSA/Proteus bacteremia s/p BKA 10/18. ID following - recommends 2 weeks ceftriaxone (day 1 is 10/18) and 4 weeks antibiotics for MRSA bacteremia.  Vancomycin trough reported >60 mcg/ml by lab however the dose was hung prior to the trough being drawn and therefore not accurate. SCr is stable, unsure if UOP is correct.  Plan: Reschedule vancomycin trough for tonight at 21:30 - spoke with RN about trough Vancomycin 1 g IV q12h Ceftriaxone 2 g IV q24h  Height: 5\' 11"  (180.3 cm) Weight: 185 lb (83.9 kg) IBW/kg (Calculated) : 75.3  Temp (24hrs), Avg:98.3 F (36.8 C), Min:97.3 F (36.3 C), Max:99.5 F (37.5 C)   Recent Labs Lab 04/10/17 0907 04/10/17 0939 04/10/17 1225 04/11/17 1118 04/12/17 0442 04/13/17 0458 04/14/17 0634  WBC 10.3  --   --  6.1 6.4 5.1 8.1  CREATININE 1.07  --   --  0.94 1.19 1.20 1.15  LATICACIDVEN  --  1.49 0.84  --   --   --   --     Estimated Creatinine Clearance: 52.7 mL/min (by C-G formula based on SCr of 1.15 mg/dL).    Allergies  Allergen Reactions  . Sulfonamide Derivatives Hives  . Tramadol Itching    Loura BackJennifer Woodston, PharmD, BCPS Clinical Pharmacist Phone for today [phone] Main pharmacy - [phone] 04/14/2017 11:29 AM

## 2017-04-14 NOTE — Progress Notes (Signed)
ANTICOAGULATION CONSULT NOTE - Follow Up Consult  Pharmacy Consult for heparin Indication: atrial fibrillation  Labs:  Recent Labs  04/11/17 1118 04/12/17 0442 04/12/17 1602 04/13/17 0458 04/14/17 0634  HGB 8.5* 8.2*  --  7.8*  --   HCT 27.0* 25.8*  --  24.7*  --   PLT 153 162  --  143*  --   LABPROT 24.0* 20.1*  --  17.2* 17.5*  INR 2.17 1.73  --  1.42 1.45  HEPARINUNFRC  --   --  0.11* 0.20* 0.17*  CREATININE 0.94 1.19  --  1.20 1.15    Assessment: 84yo male subtherapeutic on heparin after resumed post-op.  Goal of Therapy:  Heparin level 0.3-0.7 units/ml   Plan:  Will increase heparin gtt by 2-3 units/kg/hr to 1700 units/hr and check level in 8hr.  Vernard GamblesVeronda Annmargaret Decaprio, PharmD, BCPS  04/14/2017,7:50 AM

## 2017-04-14 NOTE — Progress Notes (Signed)
Pharmacy Anticoagulation and Antibiotic Note  [name] [name] is a 83 y.o. male admitted on 04/10/2017 with osteo and MRSA/Proteus bacteremia s/p BKA 10/18 and IV heparin/warfarin for Afib. ID following - recommends 2 weeks ceftriaxone (day 1 is 10/18) and 4 weeks antibiotics for MRSA bacteremia.  AC: HL this PM is therapeutic. INR sub-therapeutic. H/H low stable, Plt improved. No s/s of bleeding per RN.   ID: Currently on vancomycin 1 gm IV q 12 hours. 13-hour VT is supratherapeutic at 30.   Plan: Continue IV heparin at 1700 units/hr. Stop when INR > 2  F/u 8 hr HL  Warfarin 5 mg x 1 dose  Decrease vancomycin to 1 gm q 24 hours. Next dose at 2200 tomorrow  Ceftriaxone 2 g IV q24h  Height: 5\' 11"  (180.3 cm) Weight: 185 lb (83.9 kg) IBW/kg (Calculated) : 75.3  Temp (24hrs), Avg:98.8 F (37.1 C), Min:97.9 F (36.6 C), Max:99.5 F (37.5 C)   Recent Labs Lab 04/10/17 0907 04/10/17 0939 04/10/17 1225 04/11/17 1118 04/12/17 0442 04/13/17 0458 04/14/17 0634  WBC 10.3  --   --  6.1 6.4 5.1 8.1  CREATININE 1.07  --   --  0.94 1.19 1.20 1.15  LATICACIDVEN  --  1.49 0.84  --   --   --   --     Estimated Creatinine Clearance: 52.7 mL/min (by C-G formula based on SCr of 1.15 mg/dL).    Allergies  Allergen Reactions  . Sulfonamide Derivatives Hives  . Tramadol Itching    Vinnie LevelBenjamin Makhari Dovidio, PharmD., BCPS Clinical Pharmacist Pager [phone]

## 2017-04-14 NOTE — Progress Notes (Signed)
[facility]    Date of Admission:  04/10/2017   Total days of antibiotics 5           ID: [name] [name] is a 83 y.o. male with PVD and  right  Gangrenous foot c/b MRSA and proteus bacteremia Principal Problem:   Sepsis (HCC) Active Problems:   COPD (chronic obstructive pulmonary disease) (HCC)   ESOPHAGEAL MOTILITY DISORDER   GERD   Dysphagia   Essential hypertension, benign   Chronic pain syndrome   AF (paroxysmal atrial fibrillation) (HCC)   Osteomyelitis (HCC)   DVT (deep venous thrombosis) (HCC)   Fever   Hyperglycemia   Warfarin-induced coagulopathy (HCC)   Elevated INR   Peripheral vascular disease (HCC)   Gangrene of toe of right foot (HCC)    Subjective: Feeling still having pains to low back, hips from prior fracture and right leg. Afebrile. Friends visiting at bedside  Medications:  . escitalopram  10 mg Oral Daily  . feeding supplement (ENSURE ENLIVE)  237 mL Oral BID BM  . pantoprazole  40 mg Oral Daily  . senna-docusate  1 tablet Oral BID  . verapamil  120 mg Oral QHS    Objective: Vital signs in last 24 hours: Temp:  [97.9 F (36.6 C)-99.5 F (37.5 C)] 97.9 F (36.6 C) (10/19 1447) Pulse Rate:  [80-135] 135 (10/19 1447) Resp:  [15] 15 (10/18 2222) BP: (109-141)/(48-54) 141/54 (10/19 1447) SpO2:  [86 %-98 %] 86 % (10/19 1447) Physical Exam  Constitutional: He is oriented to person, place, and time. He appears chronically ill and frail. No distress.  HENT:  Mouth/Throat: Oropharynx is dry Cardiovascular: Normal rate, regular rhythm and normal heart sounds. Exam reveals no gallop and no friction rub.  Ext: right BKA is wrapped from surgery, blood tinged dressing    Lab Results  Recent Labs  04/13/17 0458 04/14/17 0634  WBC 5.1 8.1  HGB 7.8* 7.9*  HCT 24.7* 25.7*  NA 131* 132*  K 3.2* 3.8  CL 100* 99*  CO2 21* 22  BUN 36* 30*  CREATININE 1.20 1.15   Liver Panel  Recent Labs  04/13/17 0458  04/14/17 0634  PROT 5.2* 5.1*  ALBUMIN 1.9* 1.9*  AST 47* 54*  ALT 22 22  ALKPHOS 72 70  BILITOT 0.6 0.4   Lab Results  Component Value Date   ESRSEDRATE 105 (H) 04/10/2017   Lab Results  Component Value Date   CRP 30.4 (H) 04/10/2017    Microbiology: 10/15 blood cx mrsa 10/15 blood cx proteus-pan sensitive 10/16 blood cx NGTD Studies/Results: No results found. TTE no vegetation  Assessment/Plan: Gangrenous foot with secondary bacteremia with MRSA and proteus POD#0 s/p BKA  Polymicrobial bacteremia =   Proteus bacteremia =plan for 2 wk with ceftriaxone 2gm IV daily using 10/18 as day 1  MRSA bacteremia = patient has difficulty swallowing and unable to get TEE to see if any signs of endocarditis. Will plan to treat as complicated bacteremia.  - Plan to treat with 4 weeks of iv abtx. For now keep on vancomycin since cr appears stable. Pharmacy titrating dose and schedule to keep trough at 15-20.  BKA = dr Edilia Bodickson to do dressing change for evaluation  Patient is being evaluated for CIR which would be helpful for patient  Dr hatcher available for questions over the weekend. Will see on monday  Camela Wich, [facility] Cell: [phone] Pager: [phone]  04/14/2017, 3:11 PM

## 2017-04-14 NOTE — Evaluation (Signed)
Occupational Therapy Evaluation Patient Details Name: [name] [name] MRN: [id number] DOB: [birth year] Today's Date: 04/14/2017    History of Present Illness 83 y.o. male with history of alcohol abuse, atrial fibrillation maintained on Coumadin, HTN, CKD stage III, COPD and peripheral vascular disease. Per chart review patient lives with elderly wife who has dementia. Pt presented 04/10/2017 with ischemic right lower extremity gangrenous changes followed by vascular surgery. Recent aortogram showed bilateral popliteal artery occlusions with unreconstructable tibial disease bilaterally. Underwent right AKA 04/13/2017    Clinical Impression   This 83 y/o M presents with the above. Pt lives with spouse, reports at baseline he is mod independent with ADLs and functional mobility using RW. Pt presents with decreased activity tolerance, generalized weakness, and decreased mobility and ADL status. Pt required MaxA +2 for bed mobility and sit<>stand at RW during session, completing sit<>stand x4 during session. Pt requires max-total assist for LB and toileting ADLs. Pt will benefit from continued acute OT services and recommend additional CIR level OT services to maximize Pt's safety and independence with ADLs and functional mobility prior to discharge home.     Follow Up Recommendations  CIR;Supervision/Assistance - 24 hour    Equipment Recommendations  Other (comment) (defer to next venue )           Precautions / Restrictions Precautions Precautions: Fall Restrictions Weight Bearing Restrictions: Yes Other Position/Activity Restrictions: no formal wt bearing orders however maintained NWB in RLE       Mobility Bed Mobility Overal bed mobility: Needs Assistance Bed Mobility: Rolling;Supine to Sit;Sit to Supine Rolling: Max assist   Supine to sit: Max assist;+2 for physical assistance;+2 for safety/equipment Sit to supine: +2 for physical assistance;+2 for safety/equipment;Total assist    General bed mobility comments: multimodal cues for sequencing; cues for advancing LEs towards EOB and use of pad to assist with advancing LEs over EOB, assist to bring trunk into upright position; assist to support trunk and to bring LEs onto bed when returning to supine   Transfers Overall transfer level: Needs assistance Equipment used: Rolling walker (2 wheeled) Transfers: Sit to/from Stand Sit to Stand: Max assist;+2 physical assistance;+2 safety/equipment         General transfer comment: Pt completed sit<>stand x4 at RW with +2 assist while additional person assisted with peri care after BM, Pt with increased fatigued and unable to maintain standing for longer than approx 30 sec at a time, verbal cues for hand placement; further mobility deferred due to Pt fatigue after peri care     Balance Overall balance assessment: Needs assistance Sitting-balance support: Feet supported;Bilateral upper extremity supported Sitting balance-Leahy Scale: Fair Sitting balance - Comments: static sitting EOB    Standing balance support: Bilateral upper extremity supported Standing balance-Leahy Scale: Poor Standing balance comment: reliant on UE support on RW with additional external support from therapist/RN                            ADL either performed or assessed with clinical judgement   ADL Overall ADL's : Needs assistance/impaired Eating/Feeding: Set up;Sitting   Grooming: Set up;Sitting   Upper Body Bathing: Min guard;Sitting   Lower Body Bathing: Moderate assistance;Sitting/lateral leans;+2 for safety/equipment;+2 for physical assistance   Upper Body Dressing : Minimal assistance;Sitting   Lower Body Dressing: Maximal assistance;+2 for physical assistance;+2 for safety/equipment;Sit to/from stand       Toileting- Architect and Hygiene: Total assistance;+2 for physical assistance;+2 for safety/equipment;Sit  to/from stand Toileting - Architect  Details (indicate cue type and reason): total assist for peri care after BM, requires +2 assist to stand at [facility] while third person assisted with peri care      Functional mobility during ADLs: Maximal assistance;+2 for physical assistance;+2 for safety/equipment;Rolling walker General ADL Comments: Pt requires +2 assist for all mobility this session; completed sit<>stand at RW x4 from EOB during session for peri care after BM as Pt was unable to maintain standing longer than approx 30 sec-1 min at a time; requires rest breaks throughout                         Pertinent Vitals/Pain Pain Assessment: Faces Faces Pain Scale: Hurts whole lot Pain Location: RLE with movement  Pain Descriptors / Indicators: Grimacing;Sore;Operative site guarding Pain Intervention(s): Limited activity within patient's tolerance;Monitored during session;Repositioned;Patient requesting pain meds-RN notified          Extremity/Trunk Assessment Upper Extremity Assessment Upper Extremity Assessment: Generalized weakness   Lower Extremity Assessment Lower Extremity Assessment: Defer to PT evaluation       Communication Communication Communication: No difficulties   Cognition Arousal/Alertness: Awake/alert Behavior During Therapy: Flat affect Overall Cognitive Status: Impaired/Different from baseline Area of Impairment: Safety/judgement;Awareness;Memory;Following commands;Problem solving                     Memory: Decreased short-term memory Following Commands: Follows one step commands with increased time Safety/Judgement: Decreased awareness of deficits;Decreased awareness of safety Awareness: Emergent Problem Solving: Requires verbal cues;Requires tactile cues General Comments: Pt appears to have decreased awareness of deficits, stating all he needs to do is to get up and get into the bathroom; often had to repeat questions as pt would respond with answers not related to question asked                     Home Living Family/patient expects to be discharged to:: Inpatient rehab                                        Prior Functioning/Environment Level of Independence: Independent with assistive device(s)        Comments: Pt uses RW for mobility, lives with his wife who has dementia; reports he was completing ADLs without assist         OT Problem List: Decreased strength;Impaired balance (sitting and/or standing);Decreased range of motion;Decreased activity tolerance;Decreased knowledge of use of DME or AE;Decreased knowledge of precautions;Pain      OT Treatment/Interventions: Self-care/ADL training;DME and/or AE instruction;Therapeutic activities;Balance training;Therapeutic exercise;Energy conservation;Patient/family education    OT Goals(Current goals can be found in the care plan section) Acute Rehab OT Goals Patient Stated Goal: regain independence; less pain  OT Goal Formulation: With patient Time For Goal Achievement: 04/28/17 Potential to Achieve Goals: Good  OT Frequency: Min 2X/week    AM-PAC PT "6 Clicks" Daily Activity     Outcome Measure Help from another person eating meals?: None Help from another person taking care of personal grooming?: A Little Help from another person toileting, which includes using toliet, bedpan, or urinal?: Total Help from another person bathing (including washing, rinsing, drying)?: A Lot Help from another person to put on and taking off regular upper body clothing?: A Little Help from another person to put on and taking off regular lower body clothing?:  Total 6 Click Score: 14   End of Session Equipment Utilized During Treatment: Gait belt;Rolling walker Nurse Communication: Mobility status  Activity Tolerance: Patient tolerated treatment well Patient left: in bed;with call bell/phone within reach;with family/visitor present  OT Visit Diagnosis: Unsteadiness on feet (R26.81);Other  abnormalities of gait and mobility (R26.89);Muscle weakness (generalized) (M62.81)                Time: [id number] OT Time Calculation (min): 61 min Charges:  OT General Charges $OT Visit: 1 Visit OT Evaluation $OT Eval Moderate Complexity: 1 Mod OT Treatments $Self Care/Home Management : 38-52 mins G-Codes:     Marcy SirenBreanna Marializ Ferrebee, OT Pager [phone] 04/14/2017   Orlando PennerBreanna L Paz Winsett 04/14/2017, 3:56 PM

## 2017-04-14 NOTE — Consult Note (Signed)
Physical Medicine and Rehabilitation Consult Reason for Consult: Decreased functional mobility Referring Physician: [facility]   HPI: [name] [name] is a 83 y.o. right handed male with history of alcohol abuse, atrial fibrillation maintained on Coumadin, CKD stage III, COPD and peripheral vascular disease. Per chart review patient lives with elderly wife who has dementia. Patient uses a cane prior to admission. Local family in the area works. Presented 04/10/2017 with ischemic right lower extremity gangrenous changes followed by vascular surgery. Recent aortogram showed bilateral popliteal artery occlusions with unreconstructable tibial disease bilaterally.. Underwent right AKA 04/13/2017 per Dr. Edilia Boickson. Hospital course pain management. Follow-up infectious disease for MRSA and Proteus bacteremia and presently maintained on ceftriaxone and vancomycin. Presently on IV heparin slowly resuming chronic Coumadin. Acute blood loss anemia 7.8 and monitored. Physical and occupational therapy evaluations pending. M.D. has requested physical medicine rehabilitation consult.   Review of Systems  Constitutional: Negative for chills and fever.  HENT: Negative for hearing loss.   Eyes: Negative for blurred vision and double vision.  Respiratory: Positive for cough and shortness of breath.   Cardiovascular: Positive for palpitations and leg swelling.  Gastrointestinal: Positive for constipation. Negative for nausea and vomiting.       GERD  Genitourinary: Positive for urgency. Negative for hematuria.  Musculoskeletal: Positive for myalgias.  Skin: Negative for rash.  Neurological: Positive for weakness. Negative for seizures.  All other systems reviewed and are negative.  Past Medical History:  Diagnosis Date  . Achalasia   . Alcohol abuse    stop drinking since 01/2009  . Arthritis   . Asthma   . Atrial fibrillation (HCC)    off coumadin secondary to fall and subdural  hematoma   . B12 deficiency  9/10   started injection   . BPH (benign prostatic hyperplasia)   . Carcinoma in situ of bladder 2014   Dr. Annabell HowellsWrenn  . Chronic kidney disease    RECENT UTI FEW MONTHS AGO TX WITH ANTIBIOTIC  . Compression fracture   . COPD (chronic obstructive pulmonary disease) (HCC)   . Diverticula of colon    few small scattered in the sigmoid colon  . Enlarged prostate   . External hemorrhoids 09/2014  . GERD (gastroesophageal reflux disease)   . H/O hiatal hernia   . Headache(784.0)   . Hypertension   . Nutcracker esophagus 2002   on EM  . Osteoporosis   . Peripheral edema   . Reflux   . Venous stasis    chronic    Past Surgical History:  Procedure Laterality Date  . AMPUTATION Right 04/13/2017   Procedure: RIGHT AMPUTATION BELOW KNEE;  Surgeon: Chuck Hintickson, Christopher S, MD;  [facility];  Service: Vascular;  Laterality: Right;  . BLADDER ASPIRATION     PROC FOR CANCER CELLS   . CATARACT EXTRACTION W/PHACO  07/30/2012   Procedure: CATARACT EXTRACTION PHACO AND INTRAOCULAR LENS PLACEMENT (IOC);  Surgeon: Susa Simmondsarroll F Haines, MD;  Location: [facility] ORS;  Service: Ophthalmology;  Laterality: Left;  CDE:31.66  . CATARACT EXTRACTION W/PHACO Right 11/12/2012   Procedure: CATARACT EXTRACTION PHACO AND INTRAOCULAR LENS PLACEMENT (IOC);  Surgeon: Susa Simmondsarroll F Haines, MD;  Location: [facility] ORS;  Service: Ophthalmology;  Laterality: Right;  CDE 18.25  . CYSTOSCOPY  10/18/2011   Procedure: CYSTOSCOPY;  Surgeon: Anner CreteJohn J Wrenn, MD;  Location: [facility] ORS;  Service: Urology;  Laterality: N/A;  . ESOPHAGOGASTRODUODENOSCOPY  02/11/2010   RMR: GE narrowing s/p dilation 56 & 58 Fr maloney otherwise  normal  . ESOPHAGOGASTRODUODENOSCOPY N/A 10/01/2015   Dr.Rourk- mildly severe esophagitis, likely pill-induced, narrowed GE junction without fixed lesion found ?query achalasia, s/p maloney dilation, nomal stomach, normal second portion of the duodenum  . HERNIA REPAIR  12 YRS AGO   RIGHT SIDE   . JOINT REPLACEMENT     RT HIP X2  (1  REPLACEMENT)  . LUNG BIOPSY  10 YRS AGO   BENIGN  . MALONEY DILATION N/A 10/01/2015   Procedure: [name] DILATION;  Surgeon: Corbin Ade, MD;  Location: [facility] END[facility] SUITE;  Service: Endoscopy;  Laterality: N/A;  . PERIPHERAL VASCULAR CATHETERIZATION N/A 10/23/2015   Procedure: Abdominal Aortogram w/Lower Extremity;  Surgeon: Sherren Kerns, MD;  Location: [facility] INVASIVE CV LAB;  Service: Cardiovascular;  Laterality: N/A;  . RHINOPLASTY  1974   Family History  Problem Relation Age of Onset  . Cancer Brother   . Hypertension Mother   . Diabetes Mother   . Heart attack Mother   . Heart disease Mother   . Colon cancer Neg Hx   . Gastric cancer Neg Hx   . Esophageal cancer Neg Hx    Social History:  reports that he has never smoked. He quit smokeless tobacco use about 25 years ago. His smokeless tobacco use included Chew. He reports that he does not drink alcohol or use drugs. Allergies:  Allergies  Allergen Reactions  . Sulfonamide Derivatives Hives  . Tramadol Itching   Medications Prior to Admission  Medication Sig Dispense Refill  . CALCIUM PO Take 1 tablet by mouth daily.    Marland Kitchen escitalopram (LEXAPRO) 10 MG tablet Take 1 tablet (10 mg total) by mouth daily. 30 tablet 5  . furosemide (LASIX) 40 MG tablet Take 2 tablets every morning (Patient taking differently: Take 80 mg by mouth daily. ) 30 tablet 5  . magnesium hydroxide (MILK OF MAGNESIA) 400 MG/5ML suspension Take 15 mLs by mouth daily as needed for mild constipation.    Marland Kitchen oxyCODONE-acetaminophen (PERCOCET) 10-325 MG tablet One tablet 4 times a day as needed for pain 120 tablet 0  . polyethylene glycol (MIRALAX / GLYCOLAX) packet Take 17 g by mouth 2 (two) times daily.    . potassium chloride SA (K-DUR,KLOR-CON) 10 MEQ tablet Take 1 tablet (10 mEq total) by mouth daily. 30 tablet 5  . PSYLLIUM PO Take 1 packet by mouth daily.    Marland Kitchen senna (SENOKOT) 8.6 MG TABS tablet Take 1 tablet (8.6 mg total) by mouth at bedtime.    . verapamil  (CALAN-SR) 120 MG CR tablet Take 1 tablet (120 mg total) by mouth at bedtime. 30 tablet 5  . warfarin (COUMADIN) 5 MG tablet TAKE ONE TABLET BY MOUTH ONCE DAILY AT 6PM. 30 tablet 5    Home:    Functional History:   Functional Status:  Mobility:          ADL:    Cognition: Cognition Orientation Level: Oriented to person, Oriented to place, Oriented to situation, Disoriented to time    Blood pressure (!) 109/48, pulse 89, temperature 99.5 F (37.5 C), temperature source Axillary, resp. rate 15, height 5\' 11"  (1.803 m), weight 83.9 kg (185 lb), SpO2 91 %. Physical Exam  Constitutional: He is oriented to person, place, and time.  83 year old elderly right-handed male  HENT:  Head: Normocephalic.  Eyes: EOM are normal.  Neck: Normal range of motion. Neck supple. No thyromegaly present.  Cardiovascular:  Cardiac rate controlled  Respiratory: Effort normal and breath sounds normal.  No respiratory distress.  GI: Soft. Bowel sounds are normal. He exhibits no distension.  Musculoskeletal:  Right leg edematous, tender.   Neurological: He is alert and oriented to person, place, and time.  Limited movement RLE d/t pan. LLE grossly 3-4/5  Skin:  Amputation site is dressed appropriately tender Ischemic changes to left lower extremity with dressings in place  Psychiatric: He has a normal mood and affect. His behavior is normal. Judgment and thought content normal.    Results for orders placed or performed during the hospital encounter of 04/10/17 (from the past 24 hour(s))  Type and screen [facility]     Status: None   Collection Time: 04/13/17 10:25 AM  Result Value Ref Range   ABO/RH(D) A POS    Antibody Screen NEG    Sample Expiration 04/16/2017   ABO/Rh     Status: None   Collection Time: 04/13/17 10:25 AM  Result Value Ref Range   ABO/RH(D) A POS    No results found.  Assessment/Plan: Diagnosis: Right AKA, PAD with LLE wounds 1. Does the need for  close, 24 hr/day medical supervision in concert with the patient's rehab needs make it unreasonable for this patient to be served in a less intensive setting? Yes and Potentially 2. Co-Morbidities requiring supervision/potential complications: copd, pain mgt, PAF 3. Due to bladder management, bowel management, safety, skin/wound care, disease management, medication administration, pain management and patient education, does the patient require 24 hr/day rehab nursing? Yes 4. Does the patient require coordinated care of a physician, rehab nurse, PT (1-2 hrs/day, 5 days/week) and OT (1-2 hrs/day, 5 days/week) to address physical and functional deficits in the context of the above medical diagnosis(es)? Yes Addressing deficits in the following areas: balance, endurance, locomotion, strength, transferring, bowel/bladder control, bathing, dressing, feeding, grooming, toileting and psychosocial support 5. Can the patient actively participate in an intensive therapy program of at least 3 hrs of therapy per day at least 5 days per week? Yes 6. The potential for patient to make measurable gains while on inpatient rehab is good 7. Anticipated functional outcomes upon discharge from inpatient rehab are modified independent and supervision  with PT, modified independent and supervision with OT, n/a with SLP. 8. Estimated rehab length of stay to reach the above functional goals is: 12-16 days 9. Anticipated D/C setting: Home 10. Anticipated post D/C treatments: HH therapy and Outpatient therapy 11. Overall Rehab/Functional Prognosis: excellent  RECOMMENDATIONS: This patient's condition is appropriate for continued rehabilitative care in the following setting: CIR Patient has agreed to participate in recommended program. Yes and Potentially Note that insurance prior authorization may be required for reimbursement for recommended care.  Comment: Observe for activity tolerance. He will need some assistance once  home. Has a couple steps to enter home. Rehab Admissions Coordinator to follow up.  Thanks,  Ranelle Oyster, MD, [place] [facility]    Charlton Amor., PA-C 04/14/2017

## 2017-04-15 DIAGNOSIS — E876 Hypokalemia: Secondary | ICD-10-CM | POA: Diagnosis not present

## 2017-04-15 DIAGNOSIS — D649 Anemia, unspecified: Secondary | ICD-10-CM

## 2017-04-15 LAB — COMPREHENSIVE METABOLIC PANEL
ALT: 22 U/L (ref 17–63)
ANION GAP: 7 (ref 5–15)
AST: 37 U/L (ref 15–41)
Albumin: 1.8 g/dL — ABNORMAL LOW (ref 3.5–5.0)
Alkaline Phosphatase: 71 U/L (ref 38–126)
BUN: 31 mg/dL — ABNORMAL HIGH (ref 6–20)
CALCIUM: 7.7 mg/dL — AB (ref 8.9–10.3)
CHLORIDE: 101 mmol/L (ref 101–111)
CO2: 23 mmol/L (ref 22–32)
CREATININE: 1.21 mg/dL (ref 0.61–1.24)
GFR calc non Af Amer: 54 mL/min — ABNORMAL LOW (ref >60)
Glucose, Bld: 129 mg/dL — ABNORMAL HIGH (ref 65–99)
POTASSIUM: 3.4 mmol/L — AB (ref 3.5–5.1)
SODIUM: 131 mmol/L — AB (ref 135–145)
TOTAL PROTEIN: 4.8 g/dL — AB (ref 6.5–8.1)
Total Bilirubin: 0.3 mg/dL (ref 0.3–1.2)

## 2017-04-15 LAB — CBC WITH DIFFERENTIAL/PLATELET
BASOS ABS: 0 10*3/uL (ref 0.0–0.1)
BASOS PCT: 0 %
EOS PCT: 1 %
Eosinophils Absolute: 0.1 10*3/uL (ref 0.0–0.7)
HCT: 22.1 % — ABNORMAL LOW (ref 39.0–52.0)
Hemoglobin: 7.1 g/dL — ABNORMAL LOW (ref 13.0–17.0)
LYMPHS PCT: 7 %
Lymphs Abs: 0.6 10*3/uL — ABNORMAL LOW (ref 0.7–4.0)
MCH: 27.4 pg (ref 26.0–34.0)
MCHC: 32.1 g/dL (ref 30.0–36.0)
MCV: 85.3 fL (ref 78.0–100.0)
Monocytes Absolute: 0.7 10*3/uL (ref 0.1–1.0)
Monocytes Relative: 8 %
NEUTROS ABS: 8.1 10*3/uL — AB (ref 1.7–7.7)
Neutrophils Relative %: 84 %
PLATELETS: 162 10*3/uL (ref 150–400)
RBC: 2.59 MIL/uL — AB (ref 4.22–5.81)
RDW: 17.8 % — ABNORMAL HIGH (ref 11.5–15.5)
WBC: 9.5 10*3/uL (ref 4.0–10.5)

## 2017-04-15 LAB — PROTIME-INR
INR: 1.74
Prothrombin Time: 20.2 seconds — ABNORMAL HIGH (ref 11.4–15.2)

## 2017-04-15 LAB — PREPARE RBC (CROSSMATCH)

## 2017-04-15 LAB — HEPARIN LEVEL (UNFRACTIONATED)
HEPARIN UNFRACTIONATED: 0.32 [IU]/mL (ref 0.30–0.70)
HEPARIN UNFRACTIONATED: 0.44 [IU]/mL (ref 0.30–0.70)

## 2017-04-15 LAB — GLUCOSE, CAPILLARY: GLUCOSE-CAPILLARY: 121 mg/dL — AB (ref 65–99)

## 2017-04-15 MED ORDER — FUROSEMIDE 40 MG PO TABS
80.0000 mg | ORAL_TABLET | Freq: Every day | ORAL | Status: DC
  Administered 2017-04-15: 80 mg via ORAL
  Filled 2017-04-15: qty 2

## 2017-04-15 MED ORDER — PRO-STAT SUGAR FREE PO LIQD
30.0000 mL | Freq: Two times a day (BID) | ORAL | Status: DC
  Administered 2017-04-15 – 2017-04-20 (×10): 30 mL via ORAL
  Filled 2017-04-15 (×10): qty 30

## 2017-04-15 MED ORDER — WARFARIN SODIUM 5 MG PO TABS
5.0000 mg | ORAL_TABLET | Freq: Once | ORAL | Status: AC
  Administered 2017-04-15: 5 mg via ORAL
  Filled 2017-04-15: qty 1

## 2017-04-15 MED ORDER — SODIUM CHLORIDE 0.9 % IV SOLN
Freq: Once | INTRAVENOUS | Status: AC
  Administered 2017-04-15: 10:00:00 via INTRAVENOUS

## 2017-04-15 MED ORDER — POTASSIUM CHLORIDE 10 MEQ/100ML IV SOLN
10.0000 meq | INTRAVENOUS | Status: AC
  Administered 2017-04-15 (×2): 10 meq via INTRAVENOUS
  Filled 2017-04-15 (×2): qty 100

## 2017-04-15 MED ORDER — FUROSEMIDE 10 MG/ML IJ SOLN
20.0000 mg | Freq: Once | INTRAMUSCULAR | Status: AC
  Administered 2017-04-15: 20 mg via INTRAVENOUS
  Filled 2017-04-15: qty 2

## 2017-04-15 MED ORDER — DIPHENHYDRAMINE HCL 25 MG PO CAPS
25.0000 mg | ORAL_CAPSULE | Freq: Once | ORAL | Status: AC
  Administered 2017-04-15: 25 mg via ORAL
  Filled 2017-04-15: qty 1

## 2017-04-15 MED ORDER — ACETAMINOPHEN 325 MG PO TABS
650.0000 mg | ORAL_TABLET | Freq: Once | ORAL | Status: AC
  Administered 2017-04-15: 650 mg via ORAL
  Filled 2017-04-15: qty 2

## 2017-04-15 NOTE — Progress Notes (Signed)
ANTICOAGULATION CONSULT NOTE  Pharmacy Consult for heparin Indication: atrial fibrillation  Allergies  Allergen Reactions  . Sulfonamide Derivatives Hives  . Tramadol Itching   Patient Measurements: Height: 5\' 11"  (180.3 cm) Weight: 185 lb (83.9 kg) IBW/kg (Calculated) : 75.3  Vital Signs: Temp: 97.9 F (36.6 C) (10/20 0659) Temp Source: Oral (10/20 0659) BP: 109/64 (10/20 0659) Pulse Rate: 60 (10/20 0659)  Labs:  Recent Labs  04/13/17 0458 04/14/17 0634 04/14/17 1520 04/15/17 0017 04/15/17 0937  HGB 7.8* 7.9*  --  7.1*  --   HCT 24.7* 25.7*  --  22.1*  --   PLT 143* 168  --  162  --   LABPROT 17.2* 17.5*  --  20.2*  --   INR 1.42 1.45  --  1.74  --   HEPARINUNFRC 0.20* 0.17* 0.39 0.32 0.44  CREATININE 1.20 1.15  --  1.21  --     Estimated Creatinine Clearance: 50.1 mL/min (by C-G formula based on SCr of 1.21 mg/dL).  Assessment: 83 y/o male on warfarin PTA for Afib. Admitted with sepsis secondary to severe PVD, now s/p right BKA. Pharmacy consulted to manage heparin to warfarin bridge.   Heparin level is 0.44 and therapeutic. INR is subtherapeutic at 1.74 but trending up. No bleeding noted, Hgb low at 7.1 but stable, platelets are normal.  Goal of Therapy:  INR 2-3 Heparin level 0.3-0.7 units/ml Monitor platelets by anticoagulation protocol: Yes   Plan:  Continue heparin drip at 1800 units/hr Warfarin 5 mg PO tonight Daily heparin level, INR, and CBC Monitor for s/sx of bleeding   Loura BackJennifer Lisco, PharmD, BCPS Clinical Pharmacist Phone for today [phone] Main pharmacy - [phone] 04/15/2017 10:54 AM

## 2017-04-15 NOTE — Progress Notes (Signed)
ANTICOAGULATION CONSULT NOTE - Follow Up Consult  Pharmacy Consult for heparin Indication: atrial fibrillation  Labs:  Recent Labs  04/12/17 0442  04/13/17 0458 04/14/17 0634 04/14/17 1520 04/15/17 0017  HGB 8.2*  --  7.8* 7.9*  --  7.1*  HCT 25.8*  --  24.7* 25.7*  --  22.1*  PLT 162  --  143* 168  --  162  LABPROT 20.1*  --  17.2* 17.5*  --  20.2*  INR 1.73  --  1.42 1.45  --  1.74  HEPARINUNFRC  --   < > 0.20* 0.17* 0.39 0.32  CREATININE 1.19  --  1.20 1.15  --   --   < > = values in this interval not displayed.  Assessment: 83yo male remains at goal on heparin though trending down and now at very low end of goal.  Goal of Therapy:  Heparin level 0.3-0.7 units/ml   Plan:  Will increase heparin gtt slightly to 1800 units/hr and check level in 8hr.  Vernard GamblesVeronda Camarion Weier, PharmD, BCPS  04/15/2017,1:21 AM

## 2017-04-15 NOTE — Progress Notes (Signed)
Patient ID: [name] [name], male   DOB: [birth year], 83 y.o.   MRN: [id number]                                                                PROGRESS NOTE                                                                                                                                                                                                             Patient Demographics:    [name] [name], is a 83 y.o. male, DOB - April 04, 1933, MWN:[id number]  Admit date - 04/10/2017   Admitting Physician Cleora Fleet, MD  Outpatient Primary MD for the patient is Merlyn Albert, MD  LOS - 5  Outpatient Specialists:  Chief Complaint  Patient presents with  . Fever       Brief Narrative 84 wm with Severe peripheral vascular disease 09/2015-aortogram = bilateral popliteal occlusions without reconstructable tibial disease-recommended BKA, known chronic osteomyelitis--treated medically during 11/16/16 admission, Chronic pain, chronic Anemia, Chronic dysphagia-EGD 10/01/15-pill-induced-lost to follow-up and did not go to [facility] for manometry, DVT, Chronic A. Fib [place] score >4, COPD  Presented to [facility] emergency room 10/15 with sepsis and foot gangrene from severe peripheral vascular disease, found to have MRSA and Proteus bacteremia -Vascular surgery and infectious disease following -Underwent right below-knee amputation 10/18   Subjective:    [name] [name] today states pain controlled.  Afebrile.    No headache, No chest pain, No abdominal pain - No Nausea, No new weakness tingling or numbness, No Cough - SOB.   Assessment  & Plan :    Principal Problem:   Sepsis (HCC) Active Problems:   COPD (chronic obstructive pulmonary disease) (HCC)   ESOPHAGEAL MOTILITY DISORDER   GERD   Dysphagia   Essential hypertension, benign   Chronic pain syndrome   AF (paroxysmal atrial fibrillation) (HCC)   Osteomyelitis (HCC)   DVT (deep venous thrombosis) (HCC)   Fever   Hyperglycemia   Warfarin-induced  coagulopathy (HCC)   Elevated INR   Peripheral vascular disease (HCC)   Gangrene of toe of right foot (HCC)   Sepsis due to foot gangrene and severe PVD - 09/2015-aortogram = bilateral popliteal occlusions without reconstructable tibial disease-recommended BKA, known chronic osteomyelitis--treated during 11/16/16 admission -improving, sepsis  physiology resolved,  -continue rocephin, vanco as per ID recommendations -appreciate vascular surgery consultation, s/p  right below-knee amputation 10/18   MRSA and Proteus bacteremia -Secondary to gangrenous foot -Continue IV vancomycin and ceftriaxone per infectious disease recommendations, plan for 2 weeks of ceftriaxone using 10/18 is day 1 and 4 weeks of IV vancomycin -2-D echocardiogram without vegetations -Case discussed with Dr.Kadakia from cardiology again regarding TEE, he felt that due to patient's history of esophageal strictures and narrow esophagitis and good imaging based on TTE would not be advisable to proceed with a TEE at this time, will likely need 4 week antibiotic therapy based on this Appreciate ID recommendations  Chronic atrial fibrillation -[place] score >4, Coumadin on hold, -On heparin drip and Coumadin resumed INR still subtherapeutic  History of DVT on Coumadin -Coumadin restarted with heparin bridge  Anemia Transfuse 2 units prbc Check cbc in am   HTN/Pulmonary hypertension -continue verapamil -Appears likely volume overloaded today -Give a single dose of IV Lasix x1, resume p.o. Lasix tomorrow  COPD not on home O2 -Stable, albuterol when necessary  Chronic reflux and dysphagia -resume PPI  Code status: Full Code Family: w patient Dispo: CIR vs SNF when stable              Consultants:   Vascular surgery, Infectious disease  Procedures:   Right below-knee amputation 10/18 by Dr. Waverly Ferrari  Antimicrobials:  VANC/cefepime, flagyl, rocephin   Anti-infectives    Start      Dose/Rate Route Frequency Ordered Stop   04/15/17 2200  vancomycin (VANCOCIN) IVPB 1000 mg/200 mL premix     1,000 mg 200 mL/hr over 60 Minutes Intravenous Every 24 hours 04/14/17 2243     04/13/17 2000  cefTRIAXone (ROCEPHIN) 2 g in dextrose 5 % 50 mL IVPB     2 g 100 mL/hr over 30 Minutes Intravenous Every 24 hours 04/13/17 1633     04/13/17 1600  cefUROXime (ZINACEF) 1.5 g in dextrose 5 % 50 mL IVPB  Status:  Discontinued     1.5 g 100 mL/hr over 30 Minutes Intravenous Every 12 hours 04/13/17 1354 04/13/17 1842   04/11/17 1000  ceFEPIme (MAXIPIME) 1 g in dextrose 5 % 50 mL IVPB  Status:  Discontinued     1 g 100 mL/hr over 30 Minutes Intravenous Every 12 hours 04/10/17 2111 04/13/17 1633   04/10/17 2200  vancomycin (VANCOCIN) IVPB 1000 mg/200 mL premix  Status:  Discontinued     1,000 mg 200 mL/hr over 60 Minutes Intravenous Every 12 hours 04/10/17 1901 04/14/17 2240   04/10/17 2000  ceFEPIme (MAXIPIME) 2 g in dextrose 5 % 50 mL IVPB     2 g 100 mL/hr over 30 Minutes Intravenous  Once 04/10/17 1859 04/10/17 2003   04/10/17 1800  piperacillin-tazobactam (ZOSYN) IVPB 3.375 g  Status:  Discontinued     3.375 g 12.5 mL/hr over 240 Minutes Intravenous Every 8 hours 04/10/17 1553 04/10/17 1905   04/10/17 1736  metroNIDAZOLE (FLAGYL) IVPB 500 mg  Status:  Discontinued     500 mg 100 mL/hr over 60 Minutes Intravenous Every 8 hours 04/10/17 1736 04/13/17 1842   04/10/17 0945  piperacillin-tazobactam (ZOSYN) IVPB 3.375 g     3.375 g 100 mL/hr over 30 Minutes Intravenous  Once 04/10/17 0941 04/10/17 1044   04/10/17 0945  vancomycin (VANCOCIN) IVPB 1000 mg/200 mL premix     1,000 mg 200 mL/hr over 60 Minutes Intravenous  Once 04/10/17 0941 04/10/17 1114  Objective:   Vitals:   04/14/17 0141 04/14/17 0352 04/14/17 1447 04/14/17 2149  BP:  (!) 109/48 (!) 141/54 (!) 127/54  Pulse:  89 (!) 135   Resp:    16  Temp:  99.5 F (37.5 C) 97.9 F (36.6 C) 98 F (36.7 C)  TempSrc:   Axillary Oral Oral  SpO2: 95% 91% (!) 86%   Weight:      Height:        Wt Readings from Last 3 Encounters:  04/10/17 83.9 kg (185 lb)  02/16/17 87.7 kg (193 lb 6.4 oz)  02/06/17 87.5 kg (193 lb)     Intake/Output Summary (Last 24 hours) at 04/15/17 0552 Last data filed at 04/14/17 1600  Gross per 24 hour  Intake          1357.75 ml  Output              175 ml  Net          1182.75 ml     Physical Exam  Awake Alert, Oriented X 3, No new F.N deficits, Normal affect [place].AT,PERRAL, pale conjunctiva Supple Neck,No JVD, No cervical lymphadenopathy appriciated.  Symmetrical Chest wall movement, Good air movement bilaterally, CTAB RRR,No Gallops,Rubs or new Murmurs, No Parasternal Heave +ve B.Sounds, Abd Soft, No tenderness, No organomegaly appriciated, No rebound - guarding or rigidity. No Cyanosis, Clubbing or edema, No new Rash or bruise      Data Review:    CBC  Recent Labs Lab 04/11/17 1118 04/12/17 0442 04/13/17 0458 04/14/17 0634 04/15/17 0017  WBC 6.1 6.4 5.1 8.1 9.5  HGB 8.5* 8.2* 7.8* 7.9* 7.1*  HCT 27.0* 25.8* 24.7* 25.7* 22.1*  PLT 153 162 143* 168 162  MCV 87.1 86.6 84.6 86.2 85.3  MCH 27.4 27.5 26.7 26.5 27.4  MCHC 31.5 31.8 31.6 30.7 32.1  RDW 18.1* 17.9* 17.0* 17.4* 17.8*  LYMPHSABS 0.3* 0.3* 0.6* 0.7 0.6*  MONOABS 0.3 0.3 0.5 0.6 0.7  EOSABS 0.0 0.0 0.0 0.0 0.1  BASOSABS 0.0 0.0 0.0 0.0 0.0    Chemistries   Recent Labs Lab 04/11/17 1118 04/12/17 0442 04/13/17 0458 04/14/17 0634 04/15/17 0017  NA 135 131* 131* 132* 131*  K 4.1 3.5 3.2* 3.8 3.4*  CL 107 102 100* 99* 101  CO2 19* 18* 21* 22 23  GLUCOSE 126* 131* 111* 120* 129*  BUN 24* 30* 36* 30* 31*  CREATININE 0.94 1.19 1.20 1.15 1.21  CALCIUM 8.1* 8.2* 7.9* 8.0* 7.7*  AST 37 38 47* 54* 37  ALT 13* 17 22 22 22   ALKPHOS 74 79 72 70 71  BILITOT 0.9 0.5 0.6 0.4 0.3    ------------------------------------------------------------------------------------------------------------------ No results for input(s): CHOL, HDL, LDLCALC, TRIG, CHOLHDL, LDLDIRECT in the last 72 hours.  Lab Results  Component Value Date   HGBA1C 5.8 (H) 04/10/2017   ------------------------------------------------------------------------------------------------------------------ No results for input(s): TSH, T4TOTAL, T3FREE, THYROIDAB in the last 72 hours.  Invalid input(s): FREET3 ------------------------------------------------------------------------------------------------------------------ No results for input(s): VITAMINB12, FOLATE, FERRITIN, TIBC, IRON, RETICCTPCT in the last 72 hours.  Coagulation profile  Recent Labs Lab 04/11/17 1118 04/12/17 0442 04/13/17 0458 04/14/17 0634 04/15/17 0017  INR 2.17 1.73 1.42 1.45 1.74    No results for input(s): DDIMER in the last 72 hours.  Cardiac Enzymes No results for input(s): CKMB, TROPONINI, MYOGLOBIN in the last 168 hours.  Invalid input(s): CK ------------------------------------------------------------------------------------------------------------------    Component Value Date/Time   BNP 666.0 (H) 10/04/2016 0450    Inpatient Medications  Scheduled  Meds: . escitalopram  10 mg Oral Daily  . feeding supplement (ENSURE ENLIVE)  237 mL Oral BID BM  . pantoprazole  40 mg Oral Daily  . senna-docusate  1 tablet Oral BID  . verapamil  120 mg Oral QHS  . Warfarin - Pharmacist Dosing Inpatient   Does not apply q1800   Continuous Infusions: . cefTRIAXone (ROCEPHIN)  IV Stopped (04/14/17 2212)  . heparin 1,800 Units/hr (04/15/17 0524)  . magnesium sulfate 1 - 4 g bolus IVPB    . vancomycin     PRN Meds:.acetaminophen **OR** acetaminophen, alum & mag hydroxide-simeth, guaiFENesin-dextromethorphan, hydrALAZINE, HYDROmorphone (DILAUDID) injection, ipratropium-albuterol, labetalol, magnesium sulfate 1 - 4 g bolus  IVPB, metoprolol tartrate, morphine injection, ondansetron, [DISCONTINUED] oxyCODONE-acetaminophen **AND** oxyCODONE, oxyCODONE-acetaminophen, phenol, potassium chloride, sodium chloride  Micro Results Recent Results (from the past 240 hour(s))  Culture, blood (Routine x 2)     Status: Abnormal   Collection Time: 04/10/17 10:00 AM  Result Value Ref Range Status   Specimen Description BLOOD RIGHT FOREARM DRAWN BY RN  Final   Special Requests   Final    BOTTLES DRAWN AEROBIC AND ANAEROBIC Blood Culture adequate volume   Culture  Setup Time   Final    GRAM POSITIVE COCCI GRAM NEGATIVE RODS ANAEROBIC BOTTLE Gram Stain Report Called to,Read Back By and Verified With: STEVENS,A ON 04/11/17 AT 0045 BY LOY,C PERFORMED AT [facility] AEROBIC BOTTLE  GRAM POSTIVE COCCI  MATTHEWS, B  GRAM STAIN REVIEWED-AGREE WITH RESULT Performed at [facility], [address], [place], [place] [address]    Culture (A)  Final    METHICILLIN RESISTANT STAPHYLOCOCCUS AUREUS PROTEUS MIRABILIS    Report Status 04/13/2017 FINAL  Final   Organism ID, Bacteria METHICILLIN RESISTANT STAPHYLOCOCCUS AUREUS  Final   Organism ID, Bacteria PROTEUS MIRABILIS  Final      Susceptibility   Methicillin resistant staphylococcus aureus - MIC*    CIPROFLOXACIN 2 INTERMEDIATE Intermediate     ERYTHROMYCIN >=8 RESISTANT Resistant     GENTAMICIN <=0.5 SENSITIVE Sensitive     OXACILLIN >=4 RESISTANT Resistant     TETRACYCLINE <=1 SENSITIVE Sensitive     VANCOMYCIN <=0.5 SENSITIVE Sensitive     TRIMETH/SULFA <=10 SENSITIVE Sensitive     CLINDAMYCIN <=0.25 SENSITIVE Sensitive     RIFAMPIN <=0.5 SENSITIVE Sensitive     Inducible Clindamycin NEGATIVE Sensitive     * METHICILLIN RESISTANT STAPHYLOCOCCUS AUREUS   Proteus mirabilis - MIC*    AMPICILLIN <=2 SENSITIVE Sensitive     CEFAZOLIN <=4 SENSITIVE Sensitive     CEFEPIME <=1 SENSITIVE Sensitive     CEFTAZIDIME <=1 SENSITIVE Sensitive     CEFTRIAXONE <=1 SENSITIVE Sensitive      CIPROFLOXACIN <=0.25 SENSITIVE Sensitive     GENTAMICIN <=1 SENSITIVE Sensitive     IMIPENEM 4 SENSITIVE Sensitive     TRIMETH/SULFA <=20 SENSITIVE Sensitive     AMPICILLIN/SULBACTAM <=2 SENSITIVE Sensitive     PIP/TAZO <=4 SENSITIVE Sensitive     * PROTEUS MIRABILIS  Blood Culture ID Panel (Reflexed)     Status: Abnormal   Collection Time: 04/10/17 10:00 AM  Result Value Ref Range Status   Enterococcus species NOT DETECTED NOT DETECTED Final   Listeria monocytogenes NOT DETECTED NOT DETECTED Final   Staphylococcus species DETECTED (A) NOT DETECTED Final    Comment: CRITICAL RESULT CALLED TO, READ BACK BY AND VERIFIED WITH: G.ABBOTT PHARMD 04/11/17 0734 L.CHAMPION    Staphylococcus aureus DETECTED (A) NOT DETECTED Final  Comment: Methicillin (oxacillin)-resistant Staphylococcus aureus (MRSA). MRSA is predictably resistant to beta-lactam antibiotics (except ceftaroline). Preferred therapy is vancomycin unless clinically contraindicated. Patient requires contact precautions if  hospitalized. CRITICAL RESULT CALLED TO, READ BACK BY AND VERIFIED WITH: G.ABBOTT PHARMD 04/11/17 0734 L.CHAMPION    Methicillin resistance DETECTED (A) NOT DETECTED Final    Comment: CRITICAL RESULT CALLED TO, READ BACK BY AND VERIFIED WITH: G.ABBOTT PHARMD 04/11/17 0734 L.CHAMPION    Streptococcus species NOT DETECTED NOT DETECTED Final   Streptococcus agalactiae NOT DETECTED NOT DETECTED Final   Streptococcus pneumoniae NOT DETECTED NOT DETECTED Final   Streptococcus pyogenes NOT DETECTED NOT DETECTED Final   Acinetobacter baumannii NOT DETECTED NOT DETECTED Final   Enterobacteriaceae species DETECTED (A) NOT DETECTED Final    Comment: Enterobacteriaceae represent a large family of gram-negative bacteria, not a single organism. CRITICAL RESULT CALLED TO, READ BACK BY AND VERIFIED WITH: G.ABBOTT PHARMD 04/11/17 0734 L.CHAMPION    Enterobacter cloacae complex NOT DETECTED NOT DETECTED Final   Escherichia  coli NOT DETECTED NOT DETECTED Final   Klebsiella oxytoca NOT DETECTED NOT DETECTED Final   Klebsiella pneumoniae NOT DETECTED NOT DETECTED Final   Proteus species DETECTED (A) NOT DETECTED Final    Comment: CRITICAL RESULT CALLED TO, READ BACK BY AND VERIFIED WITH: G.ABBOTT PHARMD 04/11/17 0734 L.CHAMPION    Serratia marcescens NOT DETECTED NOT DETECTED Final   Carbapenem resistance NOT DETECTED NOT DETECTED Final   Haemophilus influenzae NOT DETECTED NOT DETECTED Final   Neisseria meningitidis NOT DETECTED NOT DETECTED Final   Pseudomonas aeruginosa NOT DETECTED NOT DETECTED Final   Candida albicans NOT DETECTED NOT DETECTED Final   Candida glabrata NOT DETECTED NOT DETECTED Final   Candida krusei NOT DETECTED NOT DETECTED Final   Candida parapsilosis NOT DETECTED NOT DETECTED Final   Candida tropicalis NOT DETECTED NOT DETECTED Final    Comment: Performed at [facility], [address], [place], [place] [address]  Culture, blood (Routine x 2)     Status: Abnormal   Collection Time: 04/10/17 10:03 AM  Result Value Ref Range Status   Specimen Description BLOOD LEFT HAND  Final   Special Requests   Final    BOTTLES DRAWN AEROBIC AND ANAEROBIC Blood Culture adequate volume   Culture  Setup Time   Final    GRAM POSITIVE COCCI IN BOTH AEROBIC AND ANAEROBIC BOTTLES Gram Stain Report Called to,Read Back By and Verified With: STEVENS,A ON 04/11/17 AT 0045 BY LOY,C PERFORMED AT [facility] GRAM NEGATIVE RODS AEROBIC BOTTLE ONLY CRITICAL VALUE NOTED.  VALUE IS CONSISTENT WITH PREVIOUSLY REPORTED AND CALLED VALUE.    Culture (A)  Final    STAPHYLOCOCCUS AUREUS PROTEUS MIRABILIS SUSCEPTIBILITIES PERFORMED ON PREVIOUS CULTURE WITHIN THE LAST 5 DAYS. Performed at [facility], [address] [place], [place] [address]    Report Status 04/13/2017 FINAL  Final  Culture, blood (routine x 2)     Status: None (Preliminary result)   Collection Time: 04/11/17  7:03 PM  Result Value Ref  Range Status   Specimen Description BLOOD LEFT ANTECUBITAL  Final   Special Requests   Final    BOTTLES DRAWN AEROBIC AND ANAEROBIC Blood Culture results may not be optimal due to an excessive volume of blood received in culture bottles   Culture NO GROWTH 3 DAYS  Final   Report Status PENDING  Incomplete  Culture, blood (routine x 2)     Status: None (Preliminary result)   Collection Time: 04/11/17  7:08 PM  Result Value Ref Range Status   Specimen Description BLOOD LEFT HAND  Final   Special Requests   Final    BOTTLES DRAWN AEROBIC ONLY Blood Culture adequate volume   Culture NO GROWTH 3 DAYS  Final   Report Status PENDING  Incomplete    Radiology Reports Dg Pelvis 1-2 Views  Result Date: 04/10/2017 CLINICAL DATA:  Fever, pelvic pain. History of falls. Rule out osteomyelitis. EXAM: PELVIS - 1-2 VIEW COMPARISON:  Right hip 05/14/2013 FINDINGS: Right hip replacement in satisfactory position. Negative for fracture. No lucency surrounding the prosthesis. Negative for acute pelvic fracture. Chronic fractures with vertebroplasty at L3 and L4. IMPRESSION: No acute abnormality.  Right hip replacement. Electronically Signed   By: Marlan Palau M.D.   On: 04/10/2017 10:50   Mr Foot Right W Wo Contrast  Result Date: 04/10/2017 CLINICAL DATA:  Multiple soft tissue ulcerations. EXAM: MRI OF THE RIGHT FOREFOOT WITHOUT AND WITH CONTRAST TECHNIQUE: Multiplanar, multisequence MR imaging of the right forefoot was performed before and after the administration of intravenous contrast. CONTRAST:  17mL MULTIHANCE GADOBENATE DIMEGLUMINE 529 MG/ML IV SOLN COMPARISON:  Radiographs dated 04/10/2017 and 11/16/2016 and MRI dated 03/25/2016 FINDINGS: Bones/Joint/Cartilage & Soft tissues There is incomplete destruction of the distal phalanx of the right great toe. There is partial destruction of the middle and distal phalanges of the third toe. There is abnormal edema in all 3 of those bones. There is no abnormal  enhancement of those bones after contrast administration. However, there is also no enhancement of the soft tissues of the right great toe and there is poor soft tissue enhancement of the other toes. There is no enhancement of the soft tissues around the head of the first metatarsal with poor enhancement of the soft tissues of the ball of the foot. There is poor enhancement along the lateral aspect of the foot extending proximally to the level of the cuboid. Soft tissue ulcerations are not noted on the dorsal aspect of the forefoot. No appreciable soft tissue abscesses. IMPRESSION: 1. Osteomyelitis of the partially destroyed distal phalanx of the great toe. 2. Osteomyelitis of the middle and distal phalangeal bones of the third toe. 3. Very poor soft tissue perfusion to all the toes and to the ball of the foot particularly around the head of the first metatarsal. Electronically Signed   By: Francene Boyers M.D.   On: 04/10/2017 14:14   Dg Chest Port 1 View  Result Date: 04/10/2017 CLINICAL DATA:  Weakness. EXAM: PORTABLE CHEST 1 VIEW COMPARISON:  Radiographs of October 04, 2016. FINDINGS: Stable cardiomegaly. No pneumothorax is noted. Mild central pulmonary vascular congestion is noted. Mild bibasilar subsegmental atelectasis or infiltrates are noted. No definite pleural effusion is noted. Left midlung opacity is noted concerning for atelectasis or infiltrate. Bony thorax is unremarkable. IMPRESSION: Stable cardiomegaly with mild central pulmonary vascular congestion. Mild bibasilar subsegmental atelectasis or infiltrates are noted. Mild left perihilar atelectasis or infiltrate is noted. Followup PA and lateral chest X-ray is recommended in 3-4 weeks following trial of antibiotic therapy to ensure resolution and exclude underlying malignancy. Electronically Signed   By: Lupita Raider, M.D.   On: 04/10/2017 09:34   Dg Foot Complete Right  Result Date: 04/10/2017 CLINICAL DATA:  Fever, shortness of Breath.  Open sores on right foot EXAM: RIGHT FOOT COMPLETE - 3+ VIEW COMPARISON:  11/16/2016 FINDINGS: Near complete bone destruction noted in the distal phalanx of the right great toe with only a small amount  of residual bone seen on the lateral view concerning for acute osteomyelitis. Diffuse osteopenia throughout the foot. The metatarsal heads of the second through fifth digits are poorly visualized which may be projectional, but cannot exclude bone destruction. Again noted is bone destruction about the third PIP joint compatible with osteomyelitis, progressed. IMPRESSION: Near complete bony destruction of the distal phalanx of the right great toe compatible with osteomyelitis. Continued osteomyelitis changes at the third PIP joint. Second through fifth metatarsal heads are not well visualized which may be projectional, but cannot exclude osteomyelitis. May consider further evaluation with MRI with and without contrast. Electronically Signed   By: Charlett NoseKevin  Dover M.D.   On: 04/10/2017 10:52    Time Spent in minutes  30   Pearson GrippeJames Maitland Lesiak M.D on 04/15/2017 at 5:52 AM  Between 7am to 7pm - Pager - [phone]  After 7pm go to [email or web address] - password [email or web address]  [facility] -  Office  [phone]

## 2017-04-16 ENCOUNTER — Encounter (HOSPITAL_COMMUNITY): Payer: Self-pay | Admitting: *Deleted

## 2017-04-16 DIAGNOSIS — M8650 Other chronic hematogenous osteomyelitis, unspecified site: Secondary | ICD-10-CM

## 2017-04-16 LAB — COMPREHENSIVE METABOLIC PANEL
ALBUMIN: 2 g/dL — AB (ref 3.5–5.0)
ALK PHOS: 77 U/L (ref 38–126)
ALT: 18 U/L (ref 17–63)
AST: 25 U/L (ref 15–41)
Anion gap: 11 (ref 5–15)
BILIRUBIN TOTAL: 0.7 mg/dL (ref 0.3–1.2)
BUN: 34 mg/dL — AB (ref 6–20)
CALCIUM: 7.9 mg/dL — AB (ref 8.9–10.3)
CO2: 20 mmol/L — ABNORMAL LOW (ref 22–32)
Chloride: 101 mmol/L (ref 101–111)
Creatinine, Ser: 1.45 mg/dL — ABNORMAL HIGH (ref 0.61–1.24)
GFR calc Af Amer: 50 mL/min — ABNORMAL LOW (ref >60)
GFR, EST NON AFRICAN AMERICAN: 43 mL/min — AB (ref >60)
GLUCOSE: 94 mg/dL (ref 65–99)
Potassium: 3.9 mmol/L (ref 3.5–5.1)
Sodium: 132 mmol/L — ABNORMAL LOW (ref 135–145)
TOTAL PROTEIN: 5.2 g/dL — AB (ref 6.5–8.1)

## 2017-04-16 LAB — BPAM RBC
BLOOD PRODUCT EXPIRATION DATE: 201811092359
BLOOD PRODUCT EXPIRATION DATE: 201811092359
ISSUE DATE / TIME: 201810201507
ISSUE DATE / TIME: 201810201330
UNIT TYPE AND RH: 6200
UNIT TYPE AND RH: 6200

## 2017-04-16 LAB — CULTURE, BLOOD (ROUTINE X 2)
CULTURE: NO GROWTH
Culture: NO GROWTH
Special Requests: ADEQUATE

## 2017-04-16 LAB — GLUCOSE, CAPILLARY
Glucose-Capillary: 103 mg/dL — ABNORMAL HIGH (ref 65–99)
Glucose-Capillary: 120 mg/dL — ABNORMAL HIGH (ref 65–99)

## 2017-04-16 LAB — CBC
HEMATOCRIT: 29.4 % — AB (ref 39.0–52.0)
Hemoglobin: 9.3 g/dL — ABNORMAL LOW (ref 13.0–17.0)
MCH: 27 pg (ref 26.0–34.0)
MCHC: 31.6 g/dL (ref 30.0–36.0)
MCV: 85.2 fL (ref 78.0–100.0)
PLATELETS: 202 10*3/uL (ref 150–400)
RBC: 3.45 MIL/uL — AB (ref 4.22–5.81)
RDW: 17.3 % — AB (ref 11.5–15.5)
WBC: 12.2 10*3/uL — AB (ref 4.0–10.5)

## 2017-04-16 LAB — TYPE AND SCREEN
ABO/RH(D): A POS
Antibody Screen: NEGATIVE
UNIT DIVISION: 0
Unit division: 0

## 2017-04-16 LAB — PROTIME-INR
INR: 2.6
Prothrombin Time: 27.6 seconds — ABNORMAL HIGH (ref 11.4–15.2)

## 2017-04-16 LAB — HEPARIN LEVEL (UNFRACTIONATED): Heparin Unfractionated: 0.29 IU/mL — ABNORMAL LOW (ref 0.30–0.70)

## 2017-04-16 MED ORDER — WARFARIN SODIUM 1 MG PO TABS
1.0000 mg | ORAL_TABLET | Freq: Once | ORAL | Status: AC
  Administered 2017-04-16: 1 mg via ORAL
  Filled 2017-04-16: qty 1

## 2017-04-16 MED ORDER — FUROSEMIDE 10 MG/ML IJ SOLN
60.0000 mg | Freq: Once | INTRAMUSCULAR | Status: AC
  Administered 2017-04-16: 60 mg via INTRAVENOUS
  Filled 2017-04-16: qty 6

## 2017-04-16 NOTE — Progress Notes (Signed)
ANTICOAGULATION CONSULT NOTE  Pharmacy Consult for heparin and warfarin Indication: atrial fibrillation  Allergies  Allergen Reactions  . Sulfonamide Derivatives Hives  . Tramadol Itching   Patient Measurements: Height: 5\' 11"  (180.3 cm) Weight: 187 lb 8 oz (85 kg) IBW/kg (Calculated) : 75.3  Vital Signs: Temp: 97.8 F (36.6 C) (10/21 0700) Temp Source: Oral (10/21 0700) BP: 116/69 (10/21 0700) Pulse Rate: 48 (10/21 0700)  Labs:  Recent Labs  04/14/17 0634  04/15/17 0017 04/15/17 0937 04/16/17 0545  HGB 7.9*  --  7.1*  --  9.3*  HCT 25.7*  --  22.1*  --  29.4*  PLT 168  --  162  --  202  LABPROT 17.5*  --  20.2*  --  27.6*  INR 1.45  --  1.74  --  2.60  HEPARINUNFRC 0.17*  < > 0.32 0.44 0.29*  CREATININE 1.15  --  1.21  --  1.45*  < > = values in this interval not displayed.  Estimated Creatinine Clearance: 41.8 mL/min (A) (by C-G formula based on SCr of 1.45 mg/dL (H)).  Assessment: 83 y/o male on warfarin PTA for Afib. Admitted with sepsis secondary to severe PVD, now s/p right BKA. Pharmacy consulted to manage heparin to warfarin bridge.   Heparin level is 0.29 and just under therapeutic range so will increase rate. INR with significant increase, 2.6 >> 1.74. No bleeding noted, Hgb 9.3 after 2 units PRBC yesterday, platelets are normal. Spoke with RN and no problems with bleeding or infusion.  Discussed INR results with Dr. Selena BattenKim - plan is to keep heparin drip infusing until tomorrow to ensure INR remains >2.  PTA warfarin dose: 5 mg/day  Goal of Therapy:  INR 2-3 Heparin level 0.3-0.7 units/ml Monitor platelets by anticoagulation protocol: Yes   Plan:  Increase heparin drip to 1850 units/hr Warfarin 1 mg PO tonight Daily heparin level, INR, and CBC Monitor for s/sx of bleeding   Loura BackJennifer [place], PharmD, BCPS Clinical Pharmacist Phone for today [phone] Main pharmacy - [phone] 04/16/2017 8:16 AM

## 2017-04-16 NOTE — Progress Notes (Signed)
Subjective  - POD #3  C/o pain at amputation site   Physical Exam:  Dressing changed, incision looks good       Assessment/Plan:  POD #3  Continue to keep wound dry with daily change of dry dressing PT recomneds CIR  Gust Eugene, Wells 04/16/2017 11:08 AM --  Vitals:   04/15/17 2124 04/16/17 0700  BP: 114/76 116/69  Pulse: 79 (!) 48  Resp: 16 16  Temp: 98.3 F (36.8 C) 97.8 F (36.6 C)  SpO2: 99% 92%    Intake/Output Summary (Last 24 hours) at 04/16/17 1108 Last data filed at 04/16/17 8119  Gross per 24 hour  Intake           1309.4 ml  Output             1300 ml  Net              9.4 ml     Laboratory CBC    Component Value Date/Time   WBC 12.2 (H) 04/16/2017 0545   HGB 9.3 (L) 04/16/2017 0545   HGB 10.3 (L) 10/24/2016 1055   HCT 29.4 (L) 04/16/2017 0545   HCT 32.2 (L) 10/24/2016 1055   PLT 202 04/16/2017 0545   PLT 246 10/24/2016 1055    BMET    Component Value Date/Time   NA 132 (L) 04/16/2017 0545   NA 141 10/24/2016 1055   K 3.9 04/16/2017 0545   CL 101 04/16/2017 0545   CO2 20 (L) 04/16/2017 0545   GLUCOSE 94 04/16/2017 0545   BUN 34 (H) 04/16/2017 0545   BUN 28 (H) 10/24/2016 1055   CREATININE 1.45 (H) 04/16/2017 0545   CREATININE 1.02 02/21/2014 1047   CALCIUM 7.9 (L) 04/16/2017 0545   GFRNONAA 43 (L) 04/16/2017 0545   GFRAA 50 (L) 04/16/2017 0545    COAG Lab Results  Component Value Date   INR 2.60 04/16/2017   INR 1.74 04/15/2017   INR 1.45 04/14/2017   No results found for: PTT  Antibiotics Anti-infectives    Start     Dose/Rate Route Frequency Ordered Stop   04/15/17 2200  vancomycin (VANCOCIN) IVPB 1000 mg/200 mL premix     1,000 mg 200 mL/hr over 60 Minutes Intravenous Every 24 hours 04/14/17 2243     04/13/17 2000  cefTRIAXone (ROCEPHIN) 2 g in dextrose 5 % 50 mL IVPB     2 g 100 mL/hr over 30 Minutes Intravenous Every 24 hours 04/13/17 1633     04/13/17 1600  cefUROXime (ZINACEF) 1.5 g in dextrose 5 % 50 mL  IVPB  Status:  Discontinued     1.5 g 100 mL/hr over 30 Minutes Intravenous Every 12 hours 04/13/17 1354 04/13/17 1842   04/11/17 1000  ceFEPIme (MAXIPIME) 1 g in dextrose 5 % 50 mL IVPB  Status:  Discontinued     1 g 100 mL/hr over 30 Minutes Intravenous Every 12 hours 04/10/17 2111 04/13/17 1633   04/10/17 2200  vancomycin (VANCOCIN) IVPB 1000 mg/200 mL premix  Status:  Discontinued     1,000 mg 200 mL/hr over 60 Minutes Intravenous Every 12 hours 04/10/17 1901 04/14/17 2240   04/10/17 2000  ceFEPIme (MAXIPIME) 2 g in dextrose 5 % 50 mL IVPB     2 g 100 mL/hr over 30 Minutes Intravenous  Once 04/10/17 1859 04/10/17 2003   04/10/17 1800  piperacillin-tazobactam (ZOSYN) IVPB 3.375 g  Status:  Discontinued     3.375 g 12.5 mL/hr over 240  Minutes Intravenous Every 8 hours 04/10/17 1553 04/10/17 1905   04/10/17 1736  metroNIDAZOLE (FLAGYL) IVPB 500 mg  Status:  Discontinued     500 mg 100 mL/hr over 60 Minutes Intravenous Every 8 hours 04/10/17 1736 04/13/17 1842   04/10/17 0945  piperacillin-tazobactam (ZOSYN) IVPB 3.375 g     3.375 g 100 mL/hr over 30 Minutes Intravenous  Once 04/10/17 0941 04/10/17 1044   04/10/17 0945  vancomycin (VANCOCIN) IVPB 1000 mg/200 mL premix     1,000 mg 200 mL/hr over 60 Minutes Intravenous  Once 04/10/17 0941 04/10/17 1114       V. Charlena CrossWells Amere Bricco IV, M.D. [facility] Office: [phone] Pager:  [phone]

## 2017-04-16 NOTE — Progress Notes (Signed)
Patient ID: [name] [name], male   DOB: [birth year], 83 y.o.   MRN: [id number]                                                                PROGRESS NOTE                                                                                                                                                                                                             Patient Demographics:    [name] [name], is a 83 y.o. male, DOB - 08/22/1932, [id number]:[id number]  Admit date - 04/10/2017   Admitting Physician Cleora Fleet, MD  Outpatient Primary MD for the patient is Merlyn Albert, MD  LOS - 6  Outpatient Specialists:     Chief Complaint  Patient presents with  . Fever       Brief Narrative  84 wm with Severe peripheral vascular disease 09/2015-aortogram = bilateral popliteal occlusions without reconstructable tibial disease-recommended BKA, known chronic osteomyelitis--treated medically during 11/16/16 admission, Chronic pain, chronic Anemia, Chronic dysphagia-EGD 10/01/15-pill-induced-lost to follow-up and did not go to [facility] for manometry, DVT, Chronic A. Fib [place] score >4, COPD  Presented to [facility] emergency room 10/15 with sepsis and foot gangrene from severe peripheral vascular disease, found to have MRSA and Proteus bacteremia -Vascular surgery and infectious disease following -Underwent right below-knee amputation 10/18    Subjective:    [name] [name] today states he is doing well, afebrile.  Pt is s/p R Bka. His INR is therapeutic but pharmacy is concerned about stopping heparin prematurely.  No bleeding.  Requesting to leave on heparin til tomorrow.   No headache, No chest pain, No abdominal pain - No Nausea, No new weakness tingling or numbness, No Cough - SOB.   Assessment  & Plan :    Principal Problem:   Sepsis (HCC) Active Problems:   COPD (chronic obstructive pulmonary disease) (HCC)   ESOPHAGEAL MOTILITY DISORDER   GERD   Dysphagia   Essential hypertension, benign   Chronic  pain syndrome   AF (paroxysmal atrial fibrillation) (HCC)   Osteomyelitis (HCC)   DVT (deep venous thrombosis) (HCC)   Fever   Hyperglycemia   Warfarin-induced coagulopathy (HCC)   Elevated INR   Peripheral vascular disease (HCC)   Gangrene of  toe of right foot (HCC)   Hypokalemia   Anemia   MRSA and Proteus bacteremia -Secondary to gangrenous foot -Continue IV vancomycin and ceftriaxoneper infectious disease recommendations, plan for 2 weeks of ceftriaxone using 10/18 is day 1 and 4 weeks of IV vancomycin -2-D echocardiogram without vegetations -Case discussed with Dr.Kadakia from cardiology again regarding TEE, he felt that due to patient's history of esophageal strictures and narrow esophagitis and good imaging based on TTE would not be advisable to proceed with a TEE at this time, will likely need 4 week antibiotic therapy based on this Appreciate ID recommendations  Chronic atrial fibrillation -[place] score >4, Coumadin on hold, -On heparin drip and Coumadin resumed INR therapeutic, will leave on heparin gtt til tomorrow to ensure INR therapeutic  History of DVT on Coumadin -Coumadin restarted with heparin bridge  Anemia Transfuse 2 units prbc w improvement Check cbc in am   HTN/Pulmonary hypertension -continue verapamil -Appearslikely volume overloaded today -Give a single dose of IV Lasix x1 today again and please  resume p.o. Lasix tomorrow Check cmp in am  COPD not on home O2 -Stable, albuterol when necessary  Chronic reflux and dysphagia -cont PPI  Code status: Full Code Family: w patient Dispo: CIR vs SNF when stable DVT prophylaxis: on coumadin  Consultants:  Vascular surgery, Infectious disease  Procedures:  Right below-knee amputation 10/18 by Dr. Waverly Ferrari  Antimicrobials:  VANC/cefepime, flagyl, rocephin     Anti-infectives    Start     Dose/Rate Route Frequency Ordered Stop   04/15/17 2200  vancomycin  (VANCOCIN) IVPB 1000 mg/200 mL premix     1,000 mg 200 mL/hr over 60 Minutes Intravenous Every 24 hours 04/14/17 2243     04/13/17 2000  cefTRIAXone (ROCEPHIN) 2 g in dextrose 5 % 50 mL IVPB     2 g 100 mL/hr over 30 Minutes Intravenous Every 24 hours 04/13/17 1633     04/13/17 1600  cefUROXime (ZINACEF) 1.5 g in dextrose 5 % 50 mL IVPB  Status:  Discontinued     1.5 g 100 mL/hr over 30 Minutes Intravenous Every 12 hours 04/13/17 1354 04/13/17 1842   04/11/17 1000  ceFEPIme (MAXIPIME) 1 g in dextrose 5 % 50 mL IVPB  Status:  Discontinued     1 g 100 mL/hr over 30 Minutes Intravenous Every 12 hours 04/10/17 2111 04/13/17 1633   04/10/17 2200  vancomycin (VANCOCIN) IVPB 1000 mg/200 mL premix  Status:  Discontinued     1,000 mg 200 mL/hr over 60 Minutes Intravenous Every 12 hours 04/10/17 1901 04/14/17 2240   04/10/17 2000  ceFEPIme (MAXIPIME) 2 g in dextrose 5 % 50 mL IVPB     2 g 100 mL/hr over 30 Minutes Intravenous  Once 04/10/17 1859 04/10/17 2003   04/10/17 1800  piperacillin-tazobactam (ZOSYN) IVPB 3.375 g  Status:  Discontinued     3.375 g 12.5 mL/hr over 240 Minutes Intravenous Every 8 hours 04/10/17 1553 04/10/17 1905   04/10/17 1736  metroNIDAZOLE (FLAGYL) IVPB 500 mg  Status:  Discontinued     500 mg 100 mL/hr over 60 Minutes Intravenous Every 8 hours 04/10/17 1736 04/13/17 1842   04/10/17 0945  piperacillin-tazobactam (ZOSYN) IVPB 3.375 g     3.375 g 100 mL/hr over 30 Minutes Intravenous  Once 04/10/17 0941 04/10/17 1044   04/10/17 0945  vancomycin (VANCOCIN) IVPB 1000 mg/200 mL premix     1,000 mg 200 mL/hr over 60 Minutes Intravenous  Once 04/10/17 0941 04/10/17  1114        Objective:   Vitals:   04/15/17 1534 04/15/17 1723 04/15/17 2124 04/16/17 0700  BP: (!) 117/49 (!) 121/56 114/76 116/69  Pulse: 60 (!) 59 79 (!) 48  Resp: 16 18 16 16   Temp: 98.9 F (37.2 C) 98.7 F (37.1 C) 98.3 F (36.8 C) 97.8 F (36.6 C)  TempSrc: Oral Oral Oral Oral  SpO2: 96% 96% 99%  92%  Weight:    85 kg (187 lb 8 oz)  Height:        Wt Readings from Last 3 Encounters:  04/16/17 85 kg (187 lb 8 oz)  02/16/17 87.7 kg (193 lb 6.4 oz)  02/06/17 87.5 kg (193 lb)     Intake/Output Summary (Last 24 hours) at 04/16/17 1019 Last data filed at 04/16/17 9562  Gross per 24 hour  Intake           1309.4 ml  Output             1300 ml  Net              9.4 ml     Physical Exam  Awake Alert, Oriented X 3, No new F.N deficits, Normal affect [place].AT,PERRAL Supple Neck,No JVD, No cervical lymphadenopathy appriciated.  Symmetrical Chest wall movement, Good air movement bilaterally, slight crackle left lung base, no wheeze  RRR,No Gallops,Rubs or new Murmurs, No Parasternal Heave +ve B.Sounds, Abd Soft, No tenderness, No organomegaly appriciated, No rebound - guarding or rigidity. No Cyanosis, Clubbing or edema, No new Rash or bruise   No splinter, no janeway, no osler  R BKA     Data Review:    CBC  Recent Labs Lab 04/11/17 1118 04/12/17 0442 04/13/17 0458 04/14/17 0634 04/15/17 0017 04/16/17 0545  WBC 6.1 6.4 5.1 8.1 9.5 12.2*  HGB 8.5* 8.2* 7.8* 7.9* 7.1* 9.3*  HCT 27.0* 25.8* 24.7* 25.7* 22.1* 29.4*  PLT 153 162 143* 168 162 202  MCV 87.1 86.6 84.6 86.2 85.3 85.2  MCH 27.4 27.5 26.7 26.5 27.4 27.0  MCHC 31.5 31.8 31.6 30.7 32.1 31.6  RDW 18.1* 17.9* 17.0* 17.4* 17.8* 17.3*  LYMPHSABS 0.3* 0.3* 0.6* 0.7 0.6*  --   MONOABS 0.3 0.3 0.5 0.6 0.7  --   EOSABS 0.0 0.0 0.0 0.0 0.1  --   BASOSABS 0.0 0.0 0.0 0.0 0.0  --     Chemistries   Recent Labs Lab 04/12/17 0442 04/13/17 0458 04/14/17 0634 04/15/17 0017 04/16/17 0545  NA 131* 131* 132* 131* 132*  K 3.5 3.2* 3.8 3.4* 3.9  CL 102 100* 99* 101 101  CO2 18* 21* 22 23 20*  GLUCOSE 131* 111* 120* 129* 94  BUN 30* 36* 30* 31* 34*  CREATININE 1.19 1.20 1.15 1.21 1.45*  CALCIUM 8.2* 7.9* 8.0* 7.7* 7.9*  AST 38 47* 54* 37 25  ALT 17 22 22 22 18   ALKPHOS 79 72 70 71 77  BILITOT 0.5 0.6 0.4 0.3  0.7   ------------------------------------------------------------------------------------------------------------------ No results for input(s): CHOL, HDL, LDLCALC, TRIG, CHOLHDL, LDLDIRECT in the last 72 hours.  Lab Results  Component Value Date   HGBA1C 5.8 (H) 04/10/2017   ------------------------------------------------------------------------------------------------------------------ No results for input(s): TSH, T4TOTAL, T3FREE, THYROIDAB in the last 72 hours.  Invalid input(s): FREET3 ------------------------------------------------------------------------------------------------------------------ No results for input(s): VITAMINB12, FOLATE, FERRITIN, TIBC, IRON, RETICCTPCT in the last 72 hours.  Coagulation profile  Recent Labs Lab 04/12/17 0442 04/13/17 0458 04/14/17 0634 04/15/17 0017 04/16/17 0545  INR  1.73 1.42 1.45 1.74 2.60    No results for input(s): DDIMER in the last 72 hours.  Cardiac Enzymes No results for input(s): CKMB, TROPONINI, MYOGLOBIN in the last 168 hours.  Invalid input(s): CK ------------------------------------------------------------------------------------------------------------------    Component Value Date/Time   BNP 666.0 (H) 10/04/2016 0450    Inpatient Medications  Scheduled Meds: . escitalopram  10 mg Oral Daily  . feeding supplement (ENSURE ENLIVE)  237 mL Oral BID BM  . feeding supplement (PRO-STAT SUGAR FREE 64)  30 mL Oral BID  . furosemide  60 mg Intravenous Once  . pantoprazole  40 mg Oral Daily  . senna-docusate  1 tablet Oral BID  . verapamil  120 mg Oral QHS  . warfarin  1 mg Oral ONCE-1800  . Warfarin - Pharmacist Dosing Inpatient   Does not apply q1800   Continuous Infusions: . cefTRIAXone (ROCEPHIN)  IV Stopped (04/15/17 2032)  . heparin 1,800 Units/hr (04/15/17 2043)  . magnesium sulfate 1 - 4 g bolus IVPB    . vancomycin Stopped (04/15/17 2230)   PRN Meds:.acetaminophen **OR** acetaminophen, alum & mag  hydroxide-simeth, guaiFENesin-dextromethorphan, hydrALAZINE, HYDROmorphone (DILAUDID) injection, ipratropium-albuterol, labetalol, magnesium sulfate 1 - 4 g bolus IVPB, metoprolol tartrate, morphine injection, ondansetron, [DISCONTINUED] oxyCODONE-acetaminophen **AND** oxyCODONE, oxyCODONE-acetaminophen, phenol, potassium chloride, sodium chloride  Micro Results Recent Results (from the past 240 hour(s))  Culture, blood (Routine x 2)     Status: Abnormal   Collection Time: 04/10/17 10:00 AM  Result Value Ref Range Status   Specimen Description BLOOD RIGHT FOREARM DRAWN BY RN  Final   Special Requests   Final    BOTTLES DRAWN AEROBIC AND ANAEROBIC Blood Culture adequate volume   Culture  Setup Time   Final    GRAM POSITIVE COCCI GRAM NEGATIVE RODS ANAEROBIC BOTTLE Gram Stain Report Called to,Read Back By and Verified With: STEVENS,A ON 04/11/17 AT 0045 BY LOY,C PERFORMED AT [facility] AEROBIC BOTTLE  GRAM POSTIVE COCCI  MATTHEWS, B  GRAM STAIN REVIEWED-AGREE WITH RESULT Performed at [facility], [address] [place], [place] [address]    Culture (A)  Final    METHICILLIN RESISTANT STAPHYLOCOCCUS AUREUS PROTEUS MIRABILIS    Report Status 04/13/2017 FINAL  Final   Organism ID, Bacteria METHICILLIN RESISTANT STAPHYLOCOCCUS AUREUS  Final   Organism ID, Bacteria PROTEUS MIRABILIS  Final      Susceptibility   Methicillin resistant staphylococcus aureus - MIC*    CIPROFLOXACIN 2 INTERMEDIATE Intermediate     ERYTHROMYCIN >=8 RESISTANT Resistant     GENTAMICIN <=0.5 SENSITIVE Sensitive     OXACILLIN >=4 RESISTANT Resistant     TETRACYCLINE <=1 SENSITIVE Sensitive     VANCOMYCIN <=0.5 SENSITIVE Sensitive     TRIMETH/SULFA <=10 SENSITIVE Sensitive     CLINDAMYCIN <=0.25 SENSITIVE Sensitive     RIFAMPIN <=0.5 SENSITIVE Sensitive     Inducible Clindamycin NEGATIVE Sensitive     * METHICILLIN RESISTANT STAPHYLOCOCCUS AUREUS   Proteus mirabilis - MIC*    AMPICILLIN <=2 SENSITIVE Sensitive      CEFAZOLIN <=4 SENSITIVE Sensitive     CEFEPIME <=1 SENSITIVE Sensitive     CEFTAZIDIME <=1 SENSITIVE Sensitive     CEFTRIAXONE <=1 SENSITIVE Sensitive     CIPROFLOXACIN <=0.25 SENSITIVE Sensitive     GENTAMICIN <=1 SENSITIVE Sensitive     IMIPENEM 4 SENSITIVE Sensitive     TRIMETH/SULFA <=20 SENSITIVE Sensitive     AMPICILLIN/SULBACTAM <=2 SENSITIVE Sensitive     PIP/TAZO <=4 SENSITIVE Sensitive     *  PROTEUS MIRABILIS  Blood Culture ID Panel (Reflexed)     Status: Abnormal   Collection Time: 04/10/17 10:00 AM  Result Value Ref Range Status   Enterococcus species NOT DETECTED NOT DETECTED Final   Listeria monocytogenes NOT DETECTED NOT DETECTED Final   Staphylococcus species DETECTED (A) NOT DETECTED Final    Comment: CRITICAL RESULT CALLED TO, READ BACK BY AND VERIFIED WITH: G.ABBOTT PHARMD 04/11/17 0734 L.CHAMPION    Staphylococcus aureus DETECTED (A) NOT DETECTED Final    Comment: Methicillin (oxacillin)-resistant Staphylococcus aureus (MRSA). MRSA is predictably resistant to beta-lactam antibiotics (except ceftaroline). Preferred therapy is vancomycin unless clinically contraindicated. Patient requires contact precautions if  hospitalized. CRITICAL RESULT CALLED TO, READ BACK BY AND VERIFIED WITH: G.ABBOTT PHARMD 04/11/17 0734 L.CHAMPION    Methicillin resistance DETECTED (A) NOT DETECTED Final    Comment: CRITICAL RESULT CALLED TO, READ BACK BY AND VERIFIED WITH: G.ABBOTT PHARMD 04/11/17 0734 L.CHAMPION    Streptococcus species NOT DETECTED NOT DETECTED Final   Streptococcus agalactiae NOT DETECTED NOT DETECTED Final   Streptococcus pneumoniae NOT DETECTED NOT DETECTED Final   Streptococcus pyogenes NOT DETECTED NOT DETECTED Final   Acinetobacter baumannii NOT DETECTED NOT DETECTED Final   Enterobacteriaceae species DETECTED (A) NOT DETECTED Final    Comment: Enterobacteriaceae represent a large family of gram-negative bacteria, not a single organism. CRITICAL RESULT  CALLED TO, READ BACK BY AND VERIFIED WITH: G.ABBOTT PHARMD 04/11/17 0734 L.CHAMPION    Enterobacter cloacae complex NOT DETECTED NOT DETECTED Final   Escherichia coli NOT DETECTED NOT DETECTED Final   Klebsiella oxytoca NOT DETECTED NOT DETECTED Final   Klebsiella pneumoniae NOT DETECTED NOT DETECTED Final   Proteus species DETECTED (A) NOT DETECTED Final    Comment: CRITICAL RESULT CALLED TO, READ BACK BY AND VERIFIED WITH: G.ABBOTT PHARMD 04/11/17 0734 L.CHAMPION    Serratia marcescens NOT DETECTED NOT DETECTED Final   Carbapenem resistance NOT DETECTED NOT DETECTED Final   Haemophilus influenzae NOT DETECTED NOT DETECTED Final   Neisseria meningitidis NOT DETECTED NOT DETECTED Final   Pseudomonas aeruginosa NOT DETECTED NOT DETECTED Final   Candida albicans NOT DETECTED NOT DETECTED Final   Candida glabrata NOT DETECTED NOT DETECTED Final   Candida krusei NOT DETECTED NOT DETECTED Final   Candida parapsilosis NOT DETECTED NOT DETECTED Final   Candida tropicalis NOT DETECTED NOT DETECTED Final    Comment: Performed at [facility], [address], [place], [place] [address]  Culture, blood (Routine x 2)     Status: Abnormal   Collection Time: 04/10/17 10:03 AM  Result Value Ref Range Status   Specimen Description BLOOD LEFT HAND  Final   Special Requests   Final    BOTTLES DRAWN AEROBIC AND ANAEROBIC Blood Culture adequate volume   Culture  Setup Time   Final    GRAM POSITIVE COCCI IN BOTH AEROBIC AND ANAEROBIC BOTTLES Gram Stain Report Called to,Read Back By and Verified With: STEVENS,A ON 04/11/17 AT 0045 BY LOY,C PERFORMED AT [facility] GRAM NEGATIVE RODS AEROBIC BOTTLE ONLY CRITICAL VALUE NOTED.  VALUE IS CONSISTENT WITH PREVIOUSLY REPORTED AND CALLED VALUE.    Culture (A)  Final    STAPHYLOCOCCUS AUREUS PROTEUS MIRABILIS SUSCEPTIBILITIES PERFORMED ON PREVIOUS CULTURE WITHIN THE LAST 5 DAYS. Performed at [facility], [address], [place], [place] [address]     Report Status 04/13/2017 FINAL  Final  Culture, blood (routine x 2)     Status: None (Preliminary result)   Collection Time: 04/11/17  7:03 PM  Result Value Ref Range Status   Specimen Description BLOOD LEFT ANTECUBITAL  Final   Special Requests   Final    BOTTLES DRAWN AEROBIC AND ANAEROBIC Blood Culture results may not be optimal due to an excessive volume of blood received in culture bottles   Culture NO GROWTH 4 DAYS  Final   Report Status PENDING  Incomplete  Culture, blood (routine x 2)     Status: None (Preliminary result)   Collection Time: 04/11/17  7:08 PM  Result Value Ref Range Status   Specimen Description BLOOD LEFT HAND  Final   Special Requests   Final    BOTTLES DRAWN AEROBIC ONLY Blood Culture adequate volume   Culture NO GROWTH 4 DAYS  Final   Report Status PENDING  Incomplete    Radiology Reports Dg Pelvis 1-2 Views  Result Date: 04/10/2017 CLINICAL DATA:  Fever, pelvic pain. History of falls. Rule out osteomyelitis. EXAM: PELVIS - 1-2 VIEW COMPARISON:  Right hip 05/14/2013 FINDINGS: Right hip replacement in satisfactory position. Negative for fracture. No lucency surrounding the prosthesis. Negative for acute pelvic fracture. Chronic fractures with vertebroplasty at L3 and L4. IMPRESSION: No acute abnormality.  Right hip replacement. Electronically Signed   By: Marlan Palauharles  Clark M.D.   On: 04/10/2017 10:50   Mr Foot Right W Wo Contrast  Result Date: 04/10/2017 CLINICAL DATA:  Multiple soft tissue ulcerations. EXAM: MRI OF THE RIGHT FOREFOOT WITHOUT AND WITH CONTRAST TECHNIQUE: Multiplanar, multisequence MR imaging of the right forefoot was performed before and after the administration of intravenous contrast. CONTRAST:  17mL MULTIHANCE GADOBENATE DIMEGLUMINE 529 MG/ML IV SOLN COMPARISON:  Radiographs dated 04/10/2017 and 11/16/2016 and MRI dated 03/25/2016 FINDINGS: Bones/Joint/Cartilage & Soft tissues There is incomplete destruction of the distal phalanx of the right  great toe. There is partial destruction of the middle and distal phalanges of the third toe. There is abnormal edema in all 3 of those bones. There is no abnormal enhancement of those bones after contrast administration. However, there is also no enhancement of the soft tissues of the right great toe and there is poor soft tissue enhancement of the other toes. There is no enhancement of the soft tissues around the head of the first metatarsal with poor enhancement of the soft tissues of the ball of the foot. There is poor enhancement along the lateral aspect of the foot extending proximally to the level of the cuboid. Soft tissue ulcerations are not noted on the dorsal aspect of the forefoot. No appreciable soft tissue abscesses. IMPRESSION: 1. Osteomyelitis of the partially destroyed distal phalanx of the great toe. 2. Osteomyelitis of the middle and distal phalangeal bones of the third toe. 3. Very poor soft tissue perfusion to all the toes and to the ball of the foot particularly around the head of the first metatarsal. Electronically Signed   By: Francene BoyersJames  Maxwell M.D.   On: 04/10/2017 14:14   Dg Chest Port 1 View  Result Date: 04/10/2017 CLINICAL DATA:  Weakness. EXAM: PORTABLE CHEST 1 VIEW COMPARISON:  Radiographs of October 04, 2016. FINDINGS: Stable cardiomegaly. No pneumothorax is noted. Mild central pulmonary vascular congestion is noted. Mild bibasilar subsegmental atelectasis or infiltrates are noted. No definite pleural effusion is noted. Left midlung opacity is noted concerning for atelectasis or infiltrate. Bony thorax is unremarkable. IMPRESSION: Stable cardiomegaly with mild central pulmonary vascular congestion. Mild bibasilar subsegmental atelectasis or infiltrates are noted. Mild left perihilar atelectasis or infiltrate is noted. Followup PA and lateral chest  X-ray is recommended in 3-4 weeks following trial of antibiotic therapy to ensure resolution and exclude underlying malignancy.  Electronically Signed   By: Lupita Raider, M.D.   On: 04/10/2017 09:34   Dg Foot Complete Right  Result Date: 04/10/2017 CLINICAL DATA:  Fever, shortness of Breath. Open sores on right foot EXAM: RIGHT FOOT COMPLETE - 3+ VIEW COMPARISON:  11/16/2016 FINDINGS: Near complete bone destruction noted in the distal phalanx of the right great toe with only a small amount of residual bone seen on the lateral view concerning for acute osteomyelitis. Diffuse osteopenia throughout the foot. The metatarsal heads of the second through fifth digits are poorly visualized which may be projectional, but cannot exclude bone destruction. Again noted is bone destruction about the third PIP joint compatible with osteomyelitis, progressed. IMPRESSION: Near complete bony destruction of the distal phalanx of the right great toe compatible with osteomyelitis. Continued osteomyelitis changes at the third PIP joint. Second through fifth metatarsal heads are not well visualized which may be projectional, but cannot exclude osteomyelitis. May consider further evaluation with MRI with and without contrast. Electronically Signed   By: Charlett Nose M.D.   On: 04/10/2017 10:52    Time Spent in minutes  30   Pearson Grippe M.D on 04/16/2017 at 10:19 AM  Between 7am to 7pm - Pager - [phone]   After 7pm go to [email or web address] - password [facility]  [facility] -  Office  [phone]

## 2017-04-17 ENCOUNTER — Inpatient Hospital Stay (HOSPITAL_COMMUNITY): Payer: Medicare Other

## 2017-04-17 DIAGNOSIS — D638 Anemia in other chronic diseases classified elsewhere: Secondary | ICD-10-CM

## 2017-04-17 DIAGNOSIS — I10 Essential (primary) hypertension: Secondary | ICD-10-CM

## 2017-04-17 DIAGNOSIS — R7881 Bacteremia: Secondary | ICD-10-CM

## 2017-04-17 DIAGNOSIS — B964 Proteus (mirabilis) (morganii) as the cause of diseases classified elsewhere: Secondary | ICD-10-CM

## 2017-04-17 DIAGNOSIS — J438 Other emphysema: Secondary | ICD-10-CM

## 2017-04-17 DIAGNOSIS — E8770 Fluid overload, unspecified: Secondary | ICD-10-CM

## 2017-04-17 DIAGNOSIS — I480 Paroxysmal atrial fibrillation: Secondary | ICD-10-CM

## 2017-04-17 DIAGNOSIS — G894 Chronic pain syndrome: Secondary | ICD-10-CM

## 2017-04-17 DIAGNOSIS — D62 Acute posthemorrhagic anemia: Secondary | ICD-10-CM

## 2017-04-17 DIAGNOSIS — M869 Osteomyelitis, unspecified: Secondary | ICD-10-CM

## 2017-04-17 DIAGNOSIS — R0602 Shortness of breath: Secondary | ICD-10-CM

## 2017-04-17 DIAGNOSIS — I96 Gangrene, not elsewhere classified: Secondary | ICD-10-CM

## 2017-04-17 DIAGNOSIS — K5900 Constipation, unspecified: Secondary | ICD-10-CM

## 2017-04-17 LAB — COMPREHENSIVE METABOLIC PANEL
ALBUMIN: 1.9 g/dL — AB (ref 3.5–5.0)
ALT: 18 U/L (ref 17–63)
AST: 29 U/L (ref 15–41)
Alkaline Phosphatase: 85 U/L (ref 38–126)
Anion gap: 8 (ref 5–15)
BILIRUBIN TOTAL: 0.4 mg/dL (ref 0.3–1.2)
BUN: 36 mg/dL — AB (ref 6–20)
CHLORIDE: 99 mmol/L — AB (ref 101–111)
CO2: 24 mmol/L (ref 22–32)
Calcium: 8 mg/dL — ABNORMAL LOW (ref 8.9–10.3)
Creatinine, Ser: 1.33 mg/dL — ABNORMAL HIGH (ref 0.61–1.24)
GFR calc Af Amer: 56 mL/min — ABNORMAL LOW (ref >60)
GFR calc non Af Amer: 48 mL/min — ABNORMAL LOW (ref >60)
GLUCOSE: 97 mg/dL (ref 65–99)
POTASSIUM: 3.6 mmol/L (ref 3.5–5.1)
Sodium: 131 mmol/L — ABNORMAL LOW (ref 135–145)
Total Protein: 5.3 g/dL — ABNORMAL LOW (ref 6.5–8.1)

## 2017-04-17 LAB — MAGNESIUM: Magnesium: 1.7 mg/dL (ref 1.7–2.4)

## 2017-04-17 LAB — CBC
HCT: 30.4 % — ABNORMAL LOW (ref 39.0–52.0)
HEMOGLOBIN: 9.5 g/dL — AB (ref 13.0–17.0)
MCH: 27 pg (ref 26.0–34.0)
MCHC: 31.3 g/dL (ref 30.0–36.0)
MCV: 86.4 fL (ref 78.0–100.0)
PLATELETS: 239 10*3/uL (ref 150–400)
RBC: 3.52 MIL/uL — AB (ref 4.22–5.81)
RDW: 17.4 % — ABNORMAL HIGH (ref 11.5–15.5)
WBC: 12.1 10*3/uL — AB (ref 4.0–10.5)

## 2017-04-17 LAB — HEPARIN LEVEL (UNFRACTIONATED): HEPARIN UNFRACTIONATED: 0.42 [IU]/mL (ref 0.30–0.70)

## 2017-04-17 LAB — PROTIME-INR
INR: 2.87
Prothrombin Time: 29.9 seconds — ABNORMAL HIGH (ref 11.4–15.2)

## 2017-04-17 MED ORDER — WARFARIN SODIUM 2 MG PO TABS
2.0000 mg | ORAL_TABLET | Freq: Once | ORAL | Status: AC
  Administered 2017-04-17: 2 mg via ORAL
  Filled 2017-04-17: qty 1

## 2017-04-17 MED ORDER — DOCUSATE SODIUM 100 MG PO CAPS
100.0000 mg | ORAL_CAPSULE | Freq: Two times a day (BID) | ORAL | Status: DC
  Administered 2017-04-17 – 2017-04-18 (×2): 100 mg via ORAL
  Filled 2017-04-17 (×2): qty 1

## 2017-04-17 MED ORDER — DAPTOMYCIN 500 MG IV SOLR
8.0000 mg/kg | INTRAVENOUS | Status: DC
  Administered 2017-04-17 – 2017-04-20 (×4): 680 mg via INTRAVENOUS
  Filled 2017-04-17 (×4): qty 13.6

## 2017-04-17 MED ORDER — FUROSEMIDE 10 MG/ML IJ SOLN
40.0000 mg | Freq: Two times a day (BID) | INTRAMUSCULAR | Status: DC
  Administered 2017-04-17 – 2017-04-18 (×2): 40 mg via INTRAVENOUS
  Filled 2017-04-17 (×2): qty 4

## 2017-04-17 NOTE — Progress Notes (Signed)
Physical Therapy Treatment Patient Details Name: [name] [name] MRN: [id number] DOB: 05/09/1933 Today's Date: 04/17/2017    History of Present Illness 83 y.o. male with history of alcohol abuse, atrial fibrillation maintained on Coumadin, HTN, CKD stage III, COPD and peripheral vascular disease. Per chart review patient lives with elderly wife who has dementia. Pt presented 04/10/2017 with ischemic right lower extremity gangrenous changes followed by vascular surgery. Recent aortogram showed bilateral popliteal artery occlusions with unreconstructable tibial disease bilaterally. Underwent right AKA 04/13/2017     PT Comments    Patient progressing some with transfers via squat pivot when higher to lower surface.  Able to propel w/c despite continued c/o pain.  Perseverated on need for enema and wanting to "do it himself" on toilet.  Feel continued skilled PT in the acute setting indicated prior to d/c to CIR.   Follow Up Recommendations  CIR     Equipment Recommendations  Wheelchair (measurements PT);Wheelchair cushion (measurements PT)    Recommendations for Other Services       Precautions / Restrictions Precautions Precautions: Fall Restrictions Weight Bearing Restrictions: Yes RLE Weight Bearing: Non weight bearing Other Position/Activity Restrictions: no formal wt bearing orders however maintained NWB in RLE     Mobility  Bed Mobility Overal bed mobility: Needs Assistance Bed Mobility: Supine to Sit;Rolling Rolling: Mod assist   Supine to sit: Mod assist;+2 for physical assistance Sit to supine: Max assist;+2 for physical assistance   General bed mobility comments: assist to scoot hips and lift shoulders   Transfers Overall transfer level: Needs assistance Equipment used: Rolling walker (2 wheeled) Transfers: Squat Pivot Transfers Sit to Stand: Max assist;+2 physical assistance;+2 safety/equipment   Squat pivot transfers: Mod assist;Max assist;+2 physical assistance      General transfer comment: to chair (lower height) with armrest removed mod A +2 cues for reaching for opposite armrest; back to bed lifting assist with pad under pt  Ambulation/Gait                 Administratortairs            Wheelchair Mobility Wheelchair Mobility Wheelchair mobility: Yes Wheelchair propulsion: Both lower extermities;Left lower extremity Wheelchair parts: Needs assistance Distance: 120 Wheelchair Assistance Details (indicate cue type and reason): min guard to S level assist for propulsion due to pt c/o back pain with UE use for w/c  Modified Rankin (Stroke Patients Only)       Balance Overall balance assessment: Needs assistance Sitting-balance support: Feet supported Sitting balance-Leahy Scale: Fair     Standing balance support: Bilateral upper extremity supported Standing balance-Leahy Scale: Zero                              Cognition Arousal/Alertness: Awake/alert Behavior During Therapy: Agitated Overall Cognitive Status: Within Functional Limits for tasks assessed Area of Impairment: Safety/judgement;Awareness;Memory;Following commands;Problem solving                     Memory: Decreased short-term memory   Safety/Judgement: Decreased awareness of deficits     General Comments: feels he can get into bathroom to do his own enema despite +2 for scoot pivot to w/c      Exercises      General Comments General comments (skin integrity, edema, etc.): Pt continues to remain on 2L O2 throughout session       Pertinent Vitals/Pain Pain Assessment: Faces Faces Pain Scale: Hurts whole lot Pain  Location: RLE with movement  Pain Descriptors / Indicators: Grimacing;Sore;Operative site guarding Pain Intervention(s): Monitored during session;Repositioned    Home Living                      Prior Function            PT Goals (current goals can now be found in the care plan section) Acute Rehab PT  Goals Patient Stated Goal: regain independence; less pain  Progress towards PT goals: Progressing toward goals    Frequency    Min 3X/week      PT Plan Current plan remains appropriate    Co-evaluation              AM-PAC PT "6 Clicks" Daily Activity  Outcome Measure  Difficulty turning over in bed (including adjusting bedclothes, sheets and blankets)?: Unable Difficulty moving from lying on back to sitting on the side of the bed? : Unable Difficulty sitting down on and standing up from a chair with arms (e.g., wheelchair, bedside commode, etc,.)?: Unable Help needed moving to and from a bed to chair (including a wheelchair)?: A Lot Help needed walking in hospital room?: Total Help needed climbing 3-5 steps with a railing? : Total 6 Click Score: 7    End of Session Equipment Utilized During Treatment: Gait belt Activity Tolerance: Patient limited by pain;Patient limited by fatigue Patient left: in bed;with call bell/phone within reach;with family/visitor present   PT Visit Diagnosis: Pain;Other abnormalities of gait and mobility (R26.89);Muscle weakness (generalized) (M62.81) Pain - Right/Left: Right Pain - part of body: Leg     Time: 1440-1513 PT Time Calculation (min) (ACUTE ONLY): 33 min  Charges:  $Therapeutic Activity: 8-22 mins $Wheel Chair Management: 8-22 mins                    G CodesSheran [name], [place] [phone] 04/17/2017    [name] [name] 04/17/2017, 4:01 PM

## 2017-04-17 NOTE — Progress Notes (Signed)
[facility] Progress Note  Subjective: up in chair but says he "can't take it", referring to pain in the butt from sitting. Also no BM several days, has hx of needing enemas " many times" in the past.    Vitals:   04/16/17 1124 04/16/17 1405 04/16/17 2141 04/17/17 0705  BP:  126/61 124/67 (!) 123/54  Pulse:  78 72 (!) 54  Resp:  18 16 16   Temp:  98.2 F (36.8 C) 98 F (36.7 C) 98.5 F (36.9 C)  TempSrc:  Oral Oral Oral  SpO2: 93% 91% (!) 89% 90%  Weight:      Height:        Inpatient medications: . escitalopram  10 mg Oral Daily  . feeding supplement (ENSURE ENLIVE)  237 mL Oral BID BM  . feeding supplement (PRO-STAT SUGAR FREE 64)  30 mL Oral BID  . pantoprazole  40 mg Oral Daily  . senna-docusate  1 tablet Oral BID  . verapamil  120 mg Oral QHS  . warfarin  2 mg Oral ONCE-1800  . Warfarin - Pharmacist Dosing Inpatient   Does not apply q1800   . cefTRIAXone (ROCEPHIN)  IV Stopped (04/16/17 2132)  . heparin 1,850 Units/hr (04/17/17 0116)  . magnesium sulfate 1 - 4 g bolus IVPB    . vancomycin Stopped (04/16/17 2235)   acetaminophen **OR** acetaminophen, alum & mag hydroxide-simeth, guaiFENesin-dextromethorphan, hydrALAZINE, HYDROmorphone (DILAUDID) injection, ipratropium-albuterol, labetalol, magnesium sulfate 1 - 4 g bolus IVPB, metoprolol tartrate, morphine injection, ondansetron, [DISCONTINUED] oxyCODONE-acetaminophen **AND** oxyCODONE, oxyCODONE-acetaminophen, phenol, potassium chloride, sodium chloride  Exam: Alert, ox 3, no distress, eldelry and somewhat frail No jvd Chest +rales bilat lower RRR no mrg Abd soft ntnd no hsm Ext R AKA, L leg 1+ edema NF, Ox 3   Home meds: -lexapro/ percocet 10-325 prn -lasix 80 qam/ verapamil 120 SR qd/ KDur 10 qd -coumadin 5 qd -MOM prn/ miralax prn/ senokot hs/ psyllium qd -Calcium po  Impression: 1. MRSA and proteus bacteremia - sec to gangrene R foot. Now SP R AKA (POD#5). Needs 2 wks abx for Proteus and 4 wks of  MRSA therapy since unable to do TEE (dysphagia issues), per ID.  Start date 10/18.  Creat up 1.2- 1.3 , baseline 0.9.  ID says to switch to dapto, dc vanc, endocarditis dosing. Will ask pharm to assist. Will get PICC consult.  2. Chronic afib/ hx DVT on long-term coumadin - INR in range again today, will dc heparin  3. Anemia of ABL/ chronic disease - transfused 2u prbc on 10/20, Hb up 9.5 4. HTN - cont verapamil qd 5. Volume - rales/ LE edema, got IV lasix yest, will get CXR and repeat IV lasix 6. COPD - alb prn, not on home O2 7. Chronic reflux/ dysphagia - stable 8. Constipation - give TWE today, add colace bid 9. Dispo - CIR vs SNF.  CIR evaluating, to come by again today   Plan - as above   Vinson Moselleob Ra Pfiester MD [facility] pgr [phone] 04/17/2017, 9:45 AM    Recent Labs Lab 04/15/17 0017 04/16/17 0545 04/17/17 0533  NA 131* 132* 131*  K 3.4* 3.9 3.6  CL 101 101 99*  CO2 23 20* 24  GLUCOSE 129* 94 97  BUN 31* 34* 36*  CREATININE 1.21 1.45* 1.33*  CALCIUM 7.7* 7.9* 8.0*    Recent Labs Lab 04/15/17 0017 04/16/17 0545 04/17/17 0533  AST 37 25 29  ALT 22 18 18   ALKPHOS 71 77  85  BILITOT 0.3 0.7 0.4  PROT 4.8* 5.2* 5.3*  ALBUMIN 1.8* 2.0* 1.9*    Recent Labs Lab 04/13/17 0458 04/14/17 0634 04/15/17 0017 04/16/17 0545 04/17/17 0533  WBC 5.1 8.1 9.5 12.2* 12.1*  NEUTROABS 4.0 6.8 8.1*  --   --   HGB 7.8* 7.9* 7.1* 9.3* 9.5*  HCT 24.7* 25.7* 22.1* 29.4* 30.4*  MCV 84.6 86.2 85.3 85.2 86.4  PLT 143* 168 162 202 239   Iron/TIBC/Ferritin/ %Sat No results found for: IRON, TIBC, FERRITIN, IRONPCTSAT

## 2017-04-17 NOTE — Progress Notes (Addendum)
Occupational Therapy Treatment Patient Details Name: [name] [name] MRN: [id number] DOB: 1932-09-01 Today's Date: 04/17/2017    History of present illness 83 y.o. male with history of alcohol abuse, atrial fibrillation maintained on Coumadin, HTN, CKD stage III, COPD and peripheral vascular disease. Per chart review patient lives with elderly wife who has dementia. Pt presented 04/10/2017 with ischemic right lower extremity gangrenous changes followed by vascular surgery. Recent aortogram showed bilateral popliteal artery occlusions with unreconstructable tibial disease bilaterally. Underwent right AKA 04/13/2017    OT comments  OT returned for assistance with transfer back to bed per MD request, as Pt experiencing increased pain and discomfort with sitting up in recliner. Attempted sit<>stand at [facility] however Pt with increased fatigue due to recently completing OT tx session, unable to safely stand with MaxA +2. Utilized Maximove for total assist transfer back to bed and positioned for comfort. Will follow up during next session to assess chair setup and increase Pt's comfort and tolerance with OOB positioning.    Follow Up Recommendations  CIR;Supervision/Assistance - 24 hour    Equipment Recommendations  Other (comment) (defer to next venue )          Precautions / Restrictions Precautions Precautions: Fall Restrictions Weight Bearing Restrictions: Yes RLE Weight Bearing: Non weight bearing Other Position/Activity Restrictions: no formal wt bearing orders however maintained NWB in RLE        Mobility Bed Mobility Overal bed mobility: Needs Assistance Bed Mobility: Supine to Sit;Rolling Rolling: Max assist   Supine to sit: Max assist;+2 for physical assistance     General bed mobility comments: MaxA to roll and to bring LEs over EOB, assist to bring trunk into upright position   Transfers Overall transfer level: Needs assistance Equipment used: Rolling walker (2  wheeled) Transfers: Sit to/from [employer] Sit to Stand: Max assist;+2 physical assistance;+2 safety/equipment         General transfer comment: Pt with increased pain and fatigue, unable to achieve standing at [facility] with MaxA +2; utilized Maximove for total assist transfer back to bed     Balance Overall balance assessment: Needs assistance Sitting-balance support: Feet supported;Bilateral upper extremity supported Sitting balance-Leahy Scale: Good     Standing balance support: Bilateral upper extremity supported Standing balance-Leahy Scale: Zero                             ADL either performed or assessed with clinical judgement   ADL       Grooming: Set up;Sitting;Wash/dry face                               Functional mobility during ADLs: Maximal assistance;+2 for physical assistance General ADL Comments: MD leaving Pt's room and asking OT with assisting Pt back to bed as Pt unable to tolerate sitting up in chair due to pain; attempted use of Corene Cornea however Pt unable to stand at [facility] with MaxA +2 due to fatigue; Pt required total assist with use of MaxiMove for transfer back to supine in bed                        Cognition Arousal/Alertness: Awake/alert Behavior During Therapy: [facility] for tasks assessed/performed Overall Cognitive Status: Within Functional Limits for tasks assessed  General Comments Pt continues to remain on 2L O2 throughout session     Pertinent Vitals/ Pain       Pain Assessment: Faces Faces Pain Scale: Hurts whole lot Pain Location: RLE with movement  Pain Descriptors / Indicators: Grimacing;Sore;Operative site guarding Pain Intervention(s): Monitored during session;Repositioned                                                          Frequency  Min 2X/week        Progress Toward  Goals  OT Goals(current goals can now be found in the care plan section)  Progress towards OT goals: Progressing toward goals  Acute Rehab OT Goals Patient Stated Goal: regain independence; less pain  OT Goal Formulation: With patient Time For Goal Achievement: 04/28/17 Potential to Achieve Goals: Good ADL Goals Pt/caregiver will Perform Home Exercise Program: Increased strength;Both right and left upper extremity;With Supervision  Plan Discharge plan remains appropriate                     AM-PAC PT "6 Clicks" Daily Activity     Outcome Measure   Help from another person eating meals?: None Help from another person taking care of personal grooming?: A Little Help from another person toileting, which includes using toliet, bedpan, or urinal?: Total Help from another person bathing (including washing, rinsing, drying)?: A Lot Help from another person to put on and taking off regular upper body clothing?: A Little Help from another person to put on and taking off regular lower body clothing?: Total 6 Click Score: 14    End of Session Equipment Utilized During Treatment: Other (comment);Gait belt (maximove )  OT Visit Diagnosis: Unsteadiness on feet (R26.81);Other abnormalities of gait and mobility (R26.89);Muscle weakness (generalized) (M62.81)   Activity Tolerance Patient limited by fatigue   Patient Left in bed;with call bell/phone within reach;with nursing/sitter in room   Nurse Communication Mobility status        Time: 4098-11911019-1042 OT Time Calculation (min): 23 min  Charges: OT General Charges $OT Visit: 1 Visit OT Treatments $Self Care/Home Management : 8-22 mins  Marcy SirenBreanna Shaneal Barasch, OT Pager [phone] 04/17/2017    [name] 04/17/2017, 1:31 PM

## 2017-04-17 NOTE — Progress Notes (Signed)
ANTICOAGULATION & ANTIBIOTIC CONSULT NOTE - Follow Up Consult  Pharmacy Consult:  Heparin/Coumadin + Vancomycin Indication:  History of VTE/Afib + bacteremia/osteomyelitis  Allergies  Allergen Reactions  . Sulfonamide Derivatives Hives  . Tramadol Itching    Patient Measurements: Height: 5\' 11"  (180.3 cm) Weight: 187 lb 8 oz (85 kg) IBW/kg (Calculated) : 75.3 Heparin Dosing Weight: 85 kg  Vital Signs: Temp: 98.5 F (36.9 C) (10/22 0705) Temp Source: Oral (10/22 0705) BP: 123/54 (10/22 0705) Pulse Rate: 54 (10/22 0705)  Labs:  Recent Labs  04/15/17 0017 04/15/17 0937 04/16/17 0545 04/17/17 0533  HGB 7.1*  --  9.3* 9.5*  HCT 22.1*  --  29.4* 30.4*  PLT 162  --  202 239  LABPROT 20.2*  --  27.6* 29.9*  INR 1.74  --  2.60 2.87  HEPARINUNFRC 0.32 0.44 0.29* 0.42  CREATININE 1.21  --  1.45* 1.33*    Estimated Creatinine Clearance: 45.6 mL/min (A) (by C-G formula based on SCr of 1.33 mg/dL (H)).    Assessment: [name] with MRSA and Proteus bacteremia and osteomyelitis now s/p BKA.  Currently on vancomycin and ceftriaxone.  Patient's renal function is improving.  He is afebrile and his WBC is stable at 12.1.  Pharmacy also managing heparin bridge to Coumadin for history of VTE and Afib.  Heparin level is therapeutic and INR is also therapeutic x 2.  No bleeding report and CBC is stabilizing s/p transfusion.  Home Coumadin dose:  5mg  PO daily   Goal of Therapy:  INR 2-3 Heparin level 0.3-0.7 units/ml Monitor platelets by anticoagulation protocol: Yes  Vanc trough 15-20 mcg/mL    Plan:  Vanc 1gm IV Q24H CTX 2gm IV Q24H Monitor renal fxn, clinical progress, vanc trough at new Css  Coumadin 2mg  PO today Heparin gtt at 1850 units/hr.  F/U with order to D/C IV heparin given therapeutic INR x 2. Daily PT / INR   [name] [name], PharmD, BCPS Pager:  [phone] 04/17/2017, 7:20 AM

## 2017-04-17 NOTE — Progress Notes (Addendum)
Occupational Therapy Treatment Patient Details Name: [name] [name] MRN: [id number] DOB: 03/08/1933 Today's Date: 04/17/2017    History of present illness 83 y.o. male with history of alcohol abuse, atrial fibrillation maintained on Coumadin, HTN, CKD stage III, COPD and peripheral vascular disease. Per chart review patient lives with elderly wife who has dementia. Pt presented 04/10/2017 with ischemic right lower extremity gangrenous changes followed by vascular surgery. Recent aortogram showed bilateral popliteal artery occlusions with unreconstructable tibial disease bilaterally. Underwent right AKA 04/13/2017    OT comments  Pt progressing towards goals, able to complete squat pivot transfer from EOB to chair with MaxA +2 during session and completed seated grooming ADLs with setup assist. Pt continues to present with decreased endurance and generalized weakness;  POC remains appropriate at this time, will continue to follow acutely to increase Pt's activity tolerance, strength, safety and independence with ADLs and functional mobility.    Follow Up Recommendations  CIR;Supervision/Assistance - 24 hour    Equipment Recommendations  Other (comment) (defer to next venue )          Precautions / Restrictions Precautions Precautions: Fall Restrictions Weight Bearing Restrictions: Yes RLE Weight Bearing: Non weight bearing Other Position/Activity Restrictions: no formal wt bearing orders however maintained NWB in RLE        Mobility Bed Mobility Overal bed mobility: Needs Assistance Bed Mobility: Supine to Sit;Rolling Rolling: Max assist   Supine to sit: Max assist;+2 for physical assistance     General bed mobility comments: MaxA to roll and to bring LEs over EOB, assist to bring trunk into upright position   Transfers Overall transfer level: Needs assistance Equipment used: Rolling walker (2 wheeled) Transfers: Sit to/from [employer];Squat Pivot Transfers Sit to Stand: Max  assist;+2 physical assistance;+2 safety/equipment         General transfer comment: attempted sit<>stand at RW with MaxA +2, however Pt unable to achieve full upright standing position to attempt stand pivot; returned to sitting EOB and completed squat pivot to recliner with +2MaxA     Balance Overall balance assessment: Needs assistance Sitting-balance support: Feet supported;Bilateral upper extremity supported Sitting balance-Leahy Scale: Good     Standing balance support: Bilateral upper extremity supported Standing balance-Leahy Scale: Zero                             ADL either performed or assessed with clinical judgement   ADL       Grooming: Set up;Sitting;Wash/dry face                               Functional mobility during ADLs: Maximal assistance;+2 for physical assistance General ADL Comments: Pt completed squat pivot from EOB to recliner with MaxA +2; setup assist provided for grooming ADLs; provided education on importance of keeping RLE in extension while resting in chair/bed                        Cognition Arousal/Alertness: Awake/alert Behavior During Therapy: [id number] for tasks assessed/performed Overall Cognitive Status: Within Functional Limits for tasks assessed                                                      General  Comments Pt continues to remain on 2L O2 throughout session     Pertinent Vitals/ Pain       Pain Assessment: Faces Faces Pain Scale: Hurts whole lot Pain Location: RLE with movement  Pain Descriptors / Indicators: Grimacing;Sore;Operative site guarding Pain Intervention(s): Monitored during session;Repositioned                                                          Frequency  Min 2X/week        Progress Toward Goals  OT Goals(current goals can now be found in the care plan section)  Progress towards OT goals: Progressing toward goals  Acute  Rehab OT Goals Patient Stated Goal: regain independence; less pain  OT Goal Formulation: With patient Time For Goal Achievement: 04/28/17 Potential to Achieve Goals: Good  Plan Discharge plan remains appropriate                     AM-PAC PT "6 Clicks" Daily Activity     Outcome Measure   Help from another person eating meals?: None Help from another person taking care of personal grooming?: A Little Help from another person toileting, which includes using toliet, bedpan, or urinal?: Total Help from another person bathing (including washing, rinsing, drying)?: A Lot Help from another person to put on and taking off regular upper body clothing?: A Little Help from another person to put on and taking off regular lower body clothing?: Total 6 Click Score: 14    End of Session Equipment Utilized During Treatment: Gait belt;Rolling walker  OT Visit Diagnosis: Unsteadiness on feet (R26.81);Other abnormalities of gait and mobility (R26.89);Muscle weakness (generalized) (M62.81)   Activity Tolerance Patient tolerated treatment well;Patient limited by fatigue   Patient Left in chair;with call bell/phone within reach;with chair alarm set   Nurse Communication Mobility status        Time: 4098-1191 OT Time Calculation (min): 29 min  Charges: OT General Charges $OT Visit: 1 Visit OT Treatments $Self Care/Home Management : 23-37 mins  [name] [name], OT Pager [phone] 04/17/2017    [name] [name] 04/17/2017, 1:14 PM

## 2017-04-17 NOTE — Discharge Instructions (Signed)

## 2017-04-17 NOTE — Progress Notes (Signed)
Inpatient Rehabilitation  Continuing to follow for timing of medical readiness, therapy tolerance, and patient/family decision.  Call if questions.   Charlane FerrettiMelissa Willman Cuny, M.A., CCC/SLP Admission Coordinator  [facility]  Cell [phone]

## 2017-04-17 NOTE — Progress Notes (Signed)
Pharmacy Antibiotic Note  [name] [name] is a 83 y.o. male admitted on 04/10/2017 with osteo and MRSA/Proteus bacteremia s/p BKA on 04/13/17. ID recommends 2 weeks ceftriaxone (day 1 is 10/18) and 4 weeks antibiotics for MRSA bacteremia.  Pharmacy consulted to transition from vancomycin to daptomycin given reduced renal function.  BMI = 26, afebrile, WBC 12.1.   Plan: Cubicin 680mg  IV Q24H (~8 mg/kg) CTX 2gm IV Q24H per MD Monitor renal fxn, CK tomorrow and then weekly   Height: 5\' 11"  (180.3 cm) Weight: 187 lb 8 oz (85 kg) IBW/kg (Calculated) : 75.3  Temp (24hrs), Avg:98.2 F (36.8 C), Min:98 F (36.7 C), Max:98.5 F (36.9 C)   Recent Labs Lab 04/10/17 1225  04/13/17 0458 04/14/17 0634 04/14/17 2111 04/15/17 0017 04/16/17 0545 04/17/17 0533  WBC  --   < > 5.1 8.1  --  9.5 12.2* 12.1*  CREATININE  --   < > 1.20 1.15  --  1.21 1.45* 1.33*  LATICACIDVEN 0.84  --   --   --   --   --   --   --   VANCOTROUGH  --   --   --   --  32*  --   --   --   < > = values in this interval not displayed.  Estimated Creatinine Clearance: 45.6 mL/min (A) (by C-G formula based on SCr of 1.33 mg/dL (H)).    Allergies  Allergen Reactions  . Sulfonamide Derivatives Hives  . Tramadol Itching    Vanc 10/15>> 10/22 Cefepime 10/15>>10/18 Flagyl 10/15>>10/18 CTX 10/18>> (10/31) Cubicin 10/22 >> (11/14)  10/19 VT = 32 on 1g q12 >> 1g q24  10/15 BCx: MRSA + Proteus (pan sens) 10/16 BCx: negative   [name] [name], PharmD, BCPS Pager:  [phone] 04/17/2017, 10:50 AM

## 2017-04-17 NOTE — Progress Notes (Signed)
Orthopedic Tech Progress Note Patient Details:  Ellie LunchFred M Hollabaugh 02/26/1933 [id number]  Patient ID: Ellie LunchFred M Current, male   DOB: 01/09/1933, 83 y.o.   MRN: [id number]   Nikki DomCrawford, Shaquil Aldana 04/17/2017, 9:29 AM Called in bio-tech brace order; [facility] with [facility]

## 2017-04-17 NOTE — Progress Notes (Signed)
[facility]    Date of Admission:  04/10/2017   Total days of antibiotics 8        Day 5 ceftriaxone/day 7 vanco   ID: [name] [name] is a 83 y.o. male with vascular disease with gangrenous right great toe s/p bka complicated by secondary mrsa and proteus bacteremia Principal Problem:   Sepsis (HCC) Active Problems:   COPD (chronic obstructive pulmonary disease) (HCC)   ESOPHAGEAL MOTILITY DISORDER   GERD   Dysphagia   Essential hypertension, benign   Chronic pain syndrome   AF (paroxysmal atrial fibrillation) (HCC)   Osteomyelitis (HCC)   DVT (deep venous thrombosis) (HCC)   Fever   Hyperglycemia   Warfarin-induced coagulopathy (HCC)   Elevated INR   Peripheral vascular disease (HCC)   Gangrene of toe of right foot (HCC)   Hypokalemia   Anemia    Subjective: Afebrile, but having constipation  Medications:  . docusate sodium  100 mg Oral BID  . escitalopram  10 mg Oral Daily  . feeding supplement (ENSURE ENLIVE)  237 mL Oral BID BM  . feeding supplement (PRO-STAT SUGAR FREE 64)  30 mL Oral BID  . furosemide  40 mg Intravenous Q12H  . pantoprazole  40 mg Oral Daily  . senna-docusate  1 tablet Oral BID  . verapamil  120 mg Oral QHS  . warfarin  2 mg Oral ONCE-1800  . Warfarin - Pharmacist Dosing Inpatient   Does not apply q1800    Objective: Vital signs in last 24 hours: Temp:  [98 F (36.7 C)-98.5 F (36.9 C)] 98.1 F (36.7 C) (10/22 1300) Pulse Rate:  [54-72] 54 (10/22 1300) Resp:  [16] 16 (10/22 1300) BP: (111-124)/(54-67) 111/54 (10/22 1300) SpO2:  [89 %-96 %] 96 % (10/22 1300) Physical Exam  Constitutional: He is oriented to person, place, and time. He appears well-developed and well-nourished. No distress.  HENT:  Mouth/Throat: Oropharynx is clear and moist. No oropharyngeal exudate.  Cardiovascular: Normal rate, regular rhythm and normal heart sounds. Exam reveals no gallop and no friction rub.  No murmur heard.    Pulmonary/Chest: Effort normal and breath sounds normal. No respiratory distress. He has no wheezes.  Abdominal: Soft. Bowel sounds are decreased but distended abdomen Ext= right bka wrapped Neurological: He is alert and oriented to person, place, and time.  Skin: Skin is warm and dry. No rash noted. No erythema.  Psychiatric: He has a normal mood and affect. His behavior is normal.     Lab Results  Recent Labs  04/16/17 0545 04/17/17 0533  WBC 12.2* 12.1*  HGB 9.3* 9.5*  HCT 29.4* 30.4*  NA 132* 131*  K 3.9 3.6  CL 101 99*  CO2 20* 24  BUN 34* 36*  CREATININE 1.45* 1.33*   Liver Panel  Recent Labs  04/16/17 0545 04/17/17 0533  PROT 5.2* 5.3*  ALBUMIN 2.0* 1.9*  AST 25 29  ALT 18 18  ALKPHOS 77 85  BILITOT 0.7 0.4   Lab Results  Component Value Date   ESRSEDRATE 105 (H) 04/10/2017   Lab Results  Component Value Date   CRP 30.4 (H) 04/10/2017    Microbiology: 10/16 blood cx ngtd 10/15 blood cx MRSA bacteremia and proteus Studies/Results: Dg Chest Port 1 View  Result Date: 04/17/2017 CLINICAL DATA:  Shortness of Breath EXAM: PORTABLE CHEST 1 VIEW COMPARISON:  04/10/2017 FINDINGS: Cardiomegaly. Diffuse bilateral interstitial and alveolar opacities have worsened since prior study, likely worsening edema. Bibasilar  atelectasis present. Possible small effusions. No acute bony abnormality. IMPRESSION: Worsening bilateral interstitial and alveolar opacities, likely worsening edema/CHF. Electronically Signed   By: Charlett NoseKevin  Dover M.D.   On: 04/17/2017 11:13     Assessment/Plan: mrsa bacteremia = plan to switch to daptomycin 8mg /kg daily to finish out treatmetn course since he is having some AKi  AKI = thought to be somewhat related to vancomycin .will stop vanco to see if it gets back to baseline  Proteus bacteremia = related to his infected foot which is now amputated. Since we have source control, plan to use ceftriaxone for 2 more days then we can  stop.  Constipation = possibly causing leukocytosis. Defer to primary team to manage, patient reports success with bisacodyl suppositories nad enemas.   Drue SecondSNIDER, [facility] Cell: [phone] Pager: [phone]  04/17/2017, 5:00 PM

## 2017-04-17 NOTE — Progress Notes (Signed)
Was notified by central telemetry that Pt had 12 beats VTach.  Notified On Call.  Pt is asleep, no apparent distress, on 2L O2. Will continue to monitor.

## 2017-04-17 NOTE — Progress Notes (Signed)
Nutrition Follow-up  DOCUMENTATION CODES:   Not applicable  INTERVENTION:  Continue Ensure Enlive po BID, each supplement provides 350 kcal and 20 grams of protein  Continue 30 ml Prostat po BID, each supplement provides 100 kcal and 15 grams of protein.   Encourage adequate PO intake.   NUTRITION DIAGNOSIS:   Increased nutrient needs related to wound healing as evidenced by estimated needs; ongoing  GOAL:   Patient will meet greater than or equal to 90% of their needs; progressing  MONITOR:   PO intake, Supplement acceptance, Labs, Weight trends, Skin, I & O's  REASON FOR ASSESSMENT:   Consult Wound healing  ASSESSMENT:   83 y.o. male with COPD, severe peripheral vascular disease who has been dealing with ischemic lower extremities for nearly 2 years. Pt with Sepsis secondary to osteomyelitis and gangrene of right foot  Pt underwent R BKA 10/18.   Pt unavailable during time of visit. Meal completion 35% at lunch today. Pt currently has Ensure and Prostat ordered and has been consuming them. RD to continue with current orders to aid in adequate nutrition as well as in wound healing.   Diet Order:  Diet regular Room service appropriate? Yes; Fluid consistency: Thin  Skin:   (Incision R leg)  Last BM:  10/20  Height:   Ht Readings from Last 1 Encounters:  04/10/17 5\' 11"  (1.803 m)    Weight:   Wt Readings from Last 1 Encounters:  04/16/17 187 lb 8 oz (85 kg)    Ideal Body Weight:  78 kg  BMI:  Body mass index is 26.15 kg/m.  Estimated Nutritional Needs:   Kcal:  2000-2250  Protein:  100-110 grams  Fluid:  2-2.2 L/day  EDUCATION NEEDS:   No education needs identified at this time  Roslyn SmilingStephanie Troy Hartzog, MS, RD, LDN Pager # [phone] After hours/ weekend pager # [phone]

## 2017-04-17 NOTE — Progress Notes (Addendum)
[facility]  Subjective  - No new complaints.   Objective (!) 123/54 (!) 54 98.5 F (36.9 C) (Oral) 16 90%  Intake/Output Summary (Last 24 hours) at 04/17/17 0908 Last data filed at 04/16/17 2100  Gross per 24 hour  Intake                0 ml  Output              400 ml  Net             -400 ml    Right AKA stump viable   Assessment/Planning: POD # 4 Right AKA  Will order sock from [employer]otech  Clinton GallantCOLLINS, [name]EN 04/17/2017 9:08 AM --  Laboratory Lab Results:  Recent Labs  04/16/17 0545 04/17/17 0533  WBC 12.2* 12.1*  HGB 9.3* 9.5*  HCT 29.4* 30.4*  PLT 202 239   BMET  Recent Labs  04/16/17 0545 04/17/17 0533  NA 132* 131*  K 3.9 3.6  CL 101 99*  CO2 20* 24  GLUCOSE 94 97  BUN 34* 36*  CREATININE 1.45* 1.33*  CALCIUM 7.9* 8.0*    COAG Lab Results  Component Value Date   INR 2.87 04/17/2017   INR 2.60 04/16/2017   INR 1.74 04/15/2017   No results found for: PTT  I have interviewed the patient and examined the patient. I agree with the findings by the PA.   Cari Carawayhris Josilyn Shippee, MD [phone]

## 2017-04-18 ENCOUNTER — Inpatient Hospital Stay (HOSPITAL_COMMUNITY): Payer: Medicare Other

## 2017-04-18 DIAGNOSIS — A4102 Sepsis due to Methicillin resistant Staphylococcus aureus: Principal | ICD-10-CM

## 2017-04-18 DIAGNOSIS — A4159 Other Gram-negative sepsis: Secondary | ICD-10-CM

## 2017-04-18 DIAGNOSIS — J431 Panlobular emphysema: Secondary | ICD-10-CM

## 2017-04-18 DIAGNOSIS — I5033 Acute on chronic diastolic (congestive) heart failure: Secondary | ICD-10-CM

## 2017-04-18 DIAGNOSIS — Z89511 Acquired absence of right leg below knee: Secondary | ICD-10-CM

## 2017-04-18 LAB — BASIC METABOLIC PANEL
ANION GAP: 13 (ref 5–15)
BUN: 39 mg/dL — ABNORMAL HIGH (ref 6–20)
CHLORIDE: 95 mmol/L — AB (ref 101–111)
CO2: 26 mmol/L (ref 22–32)
Calcium: 8.1 mg/dL — ABNORMAL LOW (ref 8.9–10.3)
Creatinine, Ser: 1.53 mg/dL — ABNORMAL HIGH (ref 0.61–1.24)
GFR calc non Af Amer: 41 mL/min — ABNORMAL LOW (ref >60)
GFR, EST AFRICAN AMERICAN: 47 mL/min — AB (ref >60)
Glucose, Bld: 108 mg/dL — ABNORMAL HIGH (ref 65–99)
Potassium: 2.8 mmol/L — ABNORMAL LOW (ref 3.5–5.1)
SODIUM: 134 mmol/L — AB (ref 135–145)

## 2017-04-18 LAB — CK: CK TOTAL: 103 U/L (ref 49–397)

## 2017-04-18 LAB — GLUCOSE, CAPILLARY
GLUCOSE-CAPILLARY: 104 mg/dL — AB (ref 65–99)
Glucose-Capillary: 140 mg/dL — ABNORMAL HIGH (ref 65–99)
Glucose-Capillary: 132 mg/dL — ABNORMAL HIGH (ref 65–99)

## 2017-04-18 LAB — PROTIME-INR
INR: 3.58
Prothrombin Time: 35.5 seconds — ABNORMAL HIGH (ref 11.4–15.2)

## 2017-04-18 MED ORDER — DEXTROSE 5 % IV SOLN
2.0000 g | INTRAVENOUS | Status: DC
  Administered 2017-04-18 – 2017-04-19 (×2): 2 g via INTRAVENOUS
  Filled 2017-04-18 (×3): qty 2

## 2017-04-18 MED ORDER — POLYETHYLENE GLYCOL 3350 17 G PO PACK
17.0000 g | PACK | Freq: Every day | ORAL | Status: DC
  Administered 2017-04-18 – 2017-04-20 (×3): 17 g via ORAL
  Filled 2017-04-18 (×3): qty 1

## 2017-04-18 MED ORDER — DOCUSATE SODIUM 283 MG RE ENEM
1.0000 | ENEMA | Freq: Every day | RECTAL | Status: DC
  Filled 2017-04-18: qty 1

## 2017-04-18 MED ORDER — PSYLLIUM 95 % PO PACK
1.0000 | PACK | Freq: Every day | ORAL | Status: DC
  Administered 2017-04-18 – 2017-04-20 (×3): 1 via ORAL
  Filled 2017-04-18 (×3): qty 1

## 2017-04-18 MED ORDER — POLYETHYLENE GLYCOL 3350 17 G PO PACK: 17.0000 g | PACK | Freq: Every day | ORAL | Status: DC

## 2017-04-18 MED ORDER — SENNOSIDES-DOCUSATE SODIUM 8.6-50 MG PO TABS
1.0000 | ORAL_TABLET | Freq: Every day | ORAL | Status: DC
  Administered 2017-04-18 – 2017-04-19 (×2): 1 via ORAL
  Filled 2017-04-18 (×2): qty 1

## 2017-04-18 MED ORDER — PSYLLIUM 95 % PO PACK
1.0000 | PACK | Freq: Every day | ORAL | Status: DC
  Filled 2017-04-18: qty 1

## 2017-04-18 MED ORDER — MAGNESIUM HYDROXIDE 400 MG/5ML PO SUSP: 30.0000 mL | Freq: Every day | ORAL | Status: DC | PRN

## 2017-04-18 MED ORDER — DOCUSATE SODIUM 283 MG RE ENEM
1.0000 | ENEMA | Freq: Four times a day (QID) | RECTAL | Status: DC
  Administered 2017-04-18: 283 mg via RECTAL
  Filled 2017-04-18 (×2): qty 1

## 2017-04-18 MED ORDER — OXYCODONE-ACETAMINOPHEN 5-325 MG PO TABS
1.0000 | ORAL_TABLET | Freq: Four times a day (QID) | ORAL | Status: DC | PRN
  Administered 2017-04-18 – 2017-04-20 (×6): 2 via ORAL
  Filled 2017-04-18 (×6): qty 2

## 2017-04-18 MED ORDER — DOCUSATE SODIUM 283 MG RE ENEM
1.0000 | ENEMA | Freq: Four times a day (QID) | RECTAL | Status: AC
  Administered 2017-04-18: 283 mg via RECTAL
  Administered 2017-04-19: 56.6 mg via RECTAL
  Filled 2017-04-18 (×3): qty 1

## 2017-04-18 MED ORDER — SODIUM CHLORIDE 0.9% FLUSH
10.0000 mL | INTRAVENOUS | Status: DC | PRN
  Administered 2017-04-19: 10 mL
  Filled 2017-04-18: qty 40

## 2017-04-18 MED ORDER — VERAPAMIL HCL 40 MG PO TABS
40.0000 mg | ORAL_TABLET | Freq: Three times a day (TID) | ORAL | Status: DC
  Administered 2017-04-18 – 2017-04-20 (×7): 40 mg via ORAL
  Filled 2017-04-18 (×9): qty 1

## 2017-04-18 MED ORDER — FUROSEMIDE 10 MG/ML IJ SOLN
80.0000 mg | Freq: Three times a day (TID) | INTRAMUSCULAR | Status: DC
  Administered 2017-04-18 – 2017-04-19 (×3): 80 mg via INTRAVENOUS
  Filled 2017-04-18 (×3): qty 8

## 2017-04-18 MED ORDER — SENNOSIDES-DOCUSATE SODIUM 8.6-50 MG PO TABS: 1.0000 | ORAL_TABLET | Freq: Every day | ORAL | Status: DC

## 2017-04-18 NOTE — Clinical Social Work Note (Signed)
Clinical Social Work Assessment  Patient Details  Name: [name] [name] MRN: [id number] Date of Birth: Jul 25, 1932  Date of referral:  04/18/17               Reason for consult:  Facility Placement                Permission sought to share information with:  [name] [name] granted to share information::  Yes, Release of Information Signed  Name::     [name] [name], retail::  SNF  Relationship::  son  Contact Information:     Housing/Transportation Living arrangements for the past 2 months:  Single Family Home Source of Information:  Patient Patient Interpreter Needed:  None Criminal Activity/Legal Involvement Pertinent to Current Situation/Hospitalization:  No - Comment as needed Significant Relationships:  Adult Children, Other Family Members, Spouse Lives with:  Spouse Do you feel safe going back to the place where you live?  No Need for family participation in patient care:  Yes (Comment)  Care giving concerns:  Pt from home with spouse and is the caregiver for spouse with other health conditions.  Pt not safe to return home and per PT will need skilled nursing at discharge. In addition, patient was not accepted by CIR.   Social Worker assessment / plan:  CSW met with patient at bedside discussed disposition. Pt agreeable to go to SNF. CSW discussed the SNf options/process. Pt gave permission to send out to local area SNF's. CSW will set up transport once medically ready.  Employment status:  Retired Forensic scientist:  Medicare PT Recommendations:  [place] / Referral to community resources:  [place]  Patient/Family's Response to care:  Patient appreciative of CSW assistance with SNF placement. No other issues or concerns.  Patient/Family's Understanding of and Emotional Response to Diagnosis, Current Treatment, and Prognosis:  Patient has good understanding of diagnosis, current treatment and prognosis. Pt  hoping to get better and return home after short term rehab to care for spouse. No other issues identified.  Emotional Assessment Appearance:  Appears stated age Attitude/Demeanor/Rapport:   (Cooperative) Affect (typically observed):  Accepting, Appropriate Orientation:  Oriented to Situation, Oriented to  Time, Oriented to Place, Oriented to Self Alcohol / Substance use:  Not Applicable Psych involvement (Current and /or in the community):  No (Comment)  Discharge Needs  Concerns to be addressed:  Care Coordination Readmission within the last 30 days:    Current discharge risk:  Physical Impairment, Dependent with Mobility Barriers to Discharge:  No Barriers Identified   [name] Baxter, LCSW 04/18/2017, 4:40 PM

## 2017-04-18 NOTE — Progress Notes (Signed)
[facility] Progress Note  Subjective: up in chair but says he "can't take it", referring to pain in the butt from sitting. Also no BM several days, has hx of needing enemas " many times" in the past.    Vitals:   04/17/17 1300 04/17/17 2100 04/18/17 0030 04/18/17 0500  BP: (!) 111/54 113/64 (!) 108/56 125/71  Pulse: (!) 54 65 62 76  Resp: 16 17    Temp: 98.1 F (36.7 C) 97.9 F (36.6 C)  98.8 F (37.1 C)  TempSrc: Oral Oral  Oral  SpO2: 96% 92%  96%  Weight:    85.8 kg (189 lb 2.5 oz)  Height:        Inpatient medications: . docusate sodium  100 mg Oral BID  . escitalopram  10 mg Oral Daily  . feeding supplement (ENSURE ENLIVE)  237 mL Oral BID BM  . feeding supplement (PRO-STAT SUGAR FREE 64)  30 mL Oral BID  . furosemide  40 mg Intravenous Q12H  . pantoprazole  40 mg Oral Daily  . senna-docusate  1 tablet Oral BID  . verapamil  120 mg Oral QHS  . Warfarin - Pharmacist Dosing Inpatient   Does not apply q1800   . cefTRIAXone (ROCEPHIN)  IV Stopped (04/17/17 2257)  . DAPTOmycin (CUBICIN)  IV Stopped (04/17/17 1554)   acetaminophen **OR** acetaminophen, alum & mag hydroxide-simeth, guaiFENesin-dextromethorphan, ipratropium-albuterol, ondansetron, oxyCODONE-acetaminophen, phenol, sodium chloride  Exam: Alert, ox 3, no distress, eldelry and somewhat frail No jvd Chest +rales bilat lower RRR no mrg Abd soft ntnd no hsm Ext R AKA, L leg 1+ edema NF, Ox 3   Home meds: -lexapro/ percocet 10-325 prn -lasix 80 qam/ verapamil 120 SR qd/ KDur 10 qd -coumadin 5 qd -MOM prn/ miralax prn/ senokot hs/ psyllium qd -Calcium po  Impression: 1. MRSA and proteus bacteremia - sec to gangrene R foot. Now SP R AKA (POD#5). Needs 2 wks abx for Proteus and 4 wks of MRSA therapy since unable to do TEE (dysphagia issues), per ID.  Start date 10/18.  Creat up 1.2- 1.3 , baseline 0.9.  ID says to switch to dapto, dc vanc, endocarditis dosing. Will ask pharm to assist. Will get PICC  consult.  2. Chronic afib/ hx DVT on long-term coumadin - INR in range again today, will dc heparin  3. Anemia of ABL/ chronic disease - transfused 2u prbc on 10/20, Hb up 9.5 4. HTN - cont verapamil qd 5. Volume - rales/ LE edema, got IV lasix yest, will get CXR and repeat IV lasix 6. COPD - alb prn, not on home O2 7. Chronic reflux/ dysphagia - stable 8. Constipation - give TWE today, add colace bid 9. Dispo - CIR vs SNF.  CIR evaluating, to come by again today   Plan - as above   Vinson Moselleob Petros Ahart MD [facility] pgr [phone] 04/18/2017, 10:20 AM    Recent Labs Lab 04/16/17 0545 04/17/17 0533 04/18/17 0542  NA 132* 131* 134*  K 3.9 3.6 2.8*  CL 101 99* 95*  CO2 20* 24 26  GLUCOSE 94 97 108*  BUN 34* 36* 39*  CREATININE 1.45* 1.33* 1.53*  CALCIUM 7.9* 8.0* 8.1*    Recent Labs Lab 04/15/17 0017 04/16/17 0545 04/17/17 0533  AST 37 25 29  ALT 22 18 18   ALKPHOS 71 77 85  BILITOT 0.3 0.7 0.4  PROT 4.8* 5.2* 5.3*  ALBUMIN 1.8* 2.0* 1.9*    Recent Labs Lab 04/13/17 0458 04/14/17  1610 04/15/17 0017 04/16/17 0545 04/17/17 0533  WBC 5.1 8.1 9.5 12.2* 12.1*  NEUTROABS 4.0 6.8 8.1*  --   --   HGB 7.8* 7.9* 7.1* 9.3* 9.5*  HCT 24.7* 25.7* 22.1* 29.4* 30.4*  MCV 84.6 86.2 85.3 85.2 86.4  PLT 143* 168 162 202 239   Iron/TIBC/Ferritin/ %Sat No results found for: IRON, TIBC, FERRITIN, IRONPCTSAT

## 2017-04-18 NOTE — NC FL2 (Signed)
[place] MEDICAID FL2 LEVEL OF CARE SCREENING TOOL     IDENTIFICATION  Patient Name: [name] [name] Birthdate: 04/29/1933 Sex: male Admission Date (Current Location): 04/10/2017  [facility] [place]   Facility and Address:  [facility] [place], [address], [place], [place] [address]      Provider Number: [id number]  Attending Physician Name and Address:  Delano MetzSchertz, Robert, MD  Relative Name and Phone Number:     son, Ward ChattersBart Hopple, [phone]   Current Level of Care: Hospital Recommended Level of Care: Skilled Nursing Facility Prior Approval Number:    Date Approved/Denied:   PASRR Number: [id number]  Discharge Plan: SNF    Current Diagnoses: Patient Active Problem List   Diagnosis Date Noted  . Sepsis due to methicillin resistant Staphylococcus aureus (MRSA) (HCC) 04/18/2017  . Proteus septicemia (HCC) 04/18/2017  . Hypokalemia 04/15/2017  . Anemia 04/15/2017  . S/P BKA (below knee amputation) unilateral, right (HCC) 04/10/2017  . Gangrene of toe of right foot (HCC) 04/10/2017  . DVT (deep venous thrombosis) (HCC) 11/17/2016  . Pressure injury of skin 10/06/2016  . Acute diastolic CHF (congestive heart failure) (HCC) 10/06/2016  . Left leg DVT (HCC) 10/04/2016  . CAP (community acquired pneumonia) 10/04/2016  . AF (paroxysmal atrial fibrillation) (HCC) 10/04/2016  . Acute respiratory failure with hypoxia (HCC) 10/04/2016  . Depression, major, single episode, moderate (HCC) 08/28/2016  . Achalasia 12/09/2015  . Atherosclerosis of native arteries of extremity with rest pain (HCC) 10/21/2015  . Acute esophagitis   . Congestive heart failure, unspecified 01/01/2014  . Chronic pain syndrome 10/10/2013  . S/P total hip arthroplasty 05/14/2013  . Osteoporosis, unspecified 04/23/2013  . Essential hypertension, benign 09/19/2012  . Status post hip replacement 04/12/2012  . Spinal stenosis of lumbar region 05/03/2011  . TOTAL HIP  FOLLOW-UP 09/13/2010  . NONUNION OF FRACTURE 07/08/2010  . HIP FRACTURE, RIGHT 07/08/2010  . ALCOHOL ABUSE 05/15/2009  . ESOPHAGEAL MOTILITY DISORDER 05/15/2009  . GERD 05/15/2009  . Constipation 05/15/2009  . WEIGHT LOSS, RECENT 05/15/2009  . EXTERNAL HEMORRHOIDS 05/13/2009  . COPD (chronic obstructive pulmonary disease) (HCC) 05/13/2009  . Dysphagia 05/13/2009  . COMPRESSION FRACTURE, SPINE 03/19/2009  . HIGH BLOOD PRESSURE 03/19/2009    Orientation RESPIRATION BLADDER Height & Weight     Self, Time, Situation, Place  Normal External catheter, Continent Weight: 189 lb 2.5 oz (85.8 kg) Height:  5\' 11"  (180.3 cm)  BEHAVIORAL SYMPTOMS/MOOD NEUROLOGICAL BOWEL NUTRITION STATUS      Continent Diet (See DC Summary)  AMBULATORY STATUS COMMUNICATION OF NEEDS Skin   Extensive Assist Verbally Surgical wounds (Right BKA, gauze dressing)                       Personal Care Assistance Level of Assistance  Dressing, Bathing, Feeding Bathing Assistance: Limited assistance Feeding assistance: Limited assistance Dressing Assistance: Maximum assistance     Functional Limitations Info             SPECIAL CARE FACTORS FREQUENCY  OT (By licensed OT), PT (By licensed PT)     PT Frequency: 3x week OT Frequency: 2x week            Contractures Contractures Info: Not present    Additional Factors Info  Code Status, Allergies Code Status Info: Full code Allergies Info: SULFONAMIDE DERIVATIVES, TRAMADOL            Current Medications (04/18/2017):  This is the current hospital  active medication list Current Facility-Administered Medications  Medication Dose Route Frequency Provider Last Rate Last Dose  . acetaminophen (TYLENOL) tablet 325-650 mg  325-650 mg Oral Q4H PRN Chuck Hint, MD   650 mg at 04/18/17 1250   Or  . acetaminophen (TYLENOL) suppository 325-650 mg  325-650 mg Rectal Q4H PRN Chuck Hint, MD      . cefTRIAXone (ROCEPHIN) 2 g in  dextrose 5 % 50 mL IVPB  2 g Intravenous Q24H Delano Metz, MD      . DAPTOmycin (CUBICIN) 680 mg in sodium chloride 0.9 % IVPB  8 mg/kg Intravenous Q24H Gerilyn Nestle, [facility]   Stopped at 04/17/17 1554  . docusate sodium (ENEMEEZ) enema 283 mg  1 enema Rectal QID Delano Metz, MD      . escitalopram (LEXAPRO) tablet 10 mg  10 mg Oral Daily Johnson, Clanford L, MD   10 mg at 04/18/17 0918  . feeding supplement (ENSURE ENLIVE) (ENSURE ENLIVE) liquid 237 mL  237 mL Oral BID BM Rhetta Mura, MD   237 mL at 04/18/17 0919  . feeding supplement (PRO-STAT SUGAR FREE 64) liquid 30 mL  30 mL Oral BID Pearson Grippe, MD   30 mL at 04/18/17 0919  . furosemide (LASIX) injection 80 mg  80 mg Intravenous Q8H Delano Metz, MD   80 mg at 04/18/17 1313  . guaiFENesin-dextromethorphan (ROBITUSSIN DM) 100-10 MG/5ML syrup 15 mL  15 mL Oral Q4H PRN Chuck Hint, MD      . ipratropium-albuterol (DUONEB) 0.5-2.5 (3) MG/3ML nebulizer solution 3 mL  3 mL Nebulization Q4H PRN Schorr, Roma Kayser, NP   3 mL at 04/18/17 0053  . magnesium hydroxide (MILK OF MAGNESIA) suspension 30 mL  30 mL Oral Daily PRN Delano Metz, MD      . ondansetron [facility]) injection 4 mg  4 mg Intravenous Q6H PRN Chuck Hint, MD   4 mg at 04/15/17 1559  . oxyCODONE-acetaminophen (PERCOCET/ROXICET) 5-325 MG per tablet 1-2 tablet  1-2 tablet Oral Q6H PRN Delano Metz, MD      . pantoprazole (PROTONIX) EC tablet 40 mg  40 mg Oral Daily Chuck Hint, MD   40 mg at 04/18/17 6962  . phenol (CHLORASEPTIC) mouth spray 1 spray  1 spray Mouth/Throat PRN Chuck Hint, MD      . polyethylene glycol (MIRALAX / GLYCOLAX) packet 17 g  17 g Oral Daily Delano Metz, MD   17 g at 04/18/17 1258  . psyllium (HYDROCIL/METAMUCIL) packet 1 packet  1 packet Oral Daily Delano Metz, MD   1 packet at 04/18/17 1249  . senna-docusate (Senokot-S) tablet 1 tablet  1 tablet Oral QHS Delano Metz, MD      . sodium  chloride (OCEAN) 0.65 % nasal spray 1 spray  1 spray Each Nare PRN Zannie Cove, MD   1 spray at 04/14/17 2246  . sodium chloride flush (NS) 0.9 % injection 10-40 mL  10-40 mL Intracatheter PRN Delano Metz, MD      . verapamil (CALAN) tablet 40 mg  40 mg Oral Q8H Delano Metz, MD   40 mg at 04/18/17 1311  . Warfarin - Pharmacist Dosing Inpatient   Does not apply q1800 [facility]         Discharge Medications: Please see discharge summary for a list of discharge medications.  Relevant Imaging Results:  Relevant Lab Results:   Additional Information Ss#:[phone]  [name], LCSW

## 2017-04-18 NOTE — Progress Notes (Signed)
   VASCULAR SURGERY ASSESSMENT & PLAN:   5 Days Post-Op s/p: Right BKA  BKA is healing nicely  Wounds on left leg are stable.   SUBJECTIVE:   Pain well controlled.   PHYSICAL EXAM:   Vitals:   04/17/17 1300 04/17/17 2100 04/18/17 0030 04/18/17 0500  BP: (!) 111/54 113/64 (!) 108/56 125/71  Pulse: (!) 54 65 62 76  Resp: 16 17    Temp: 98.1 F (36.7 C) 97.9 F (36.6 C)  98.8 F (37.1 C)  TempSrc: Oral Oral  Oral  SpO2: 96% 92%  96%  Weight:    189 lb 2.5 oz (85.8 kg)  Height:       Right BKA inspected and looks good so far. Some ecchymosis on anterior aspect of BKA Wounds on left leg are unchanged.    LABS:   Lab Results  Component Value Date   WBC 12.1 (H) 04/17/2017   HGB 9.5 (L) 04/17/2017   HCT 30.4 (L) 04/17/2017   MCV 86.4 04/17/2017   PLT 239 04/17/2017   Lab Results  Component Value Date   CREATININE 1.53 (H) 04/18/2017   Lab Results  Component Value Date   INR 3.58 04/18/2017   CBG (last 3)   Recent Labs  04/16/17 0700 04/16/17 2134 04/18/17 1443  GLUCAP 103* 120* 140*    PROBLEM LIST:    Active Problems:   COPD (chronic obstructive pulmonary disease) (HCC)   Essential hypertension, benign   Chronic pain syndrome   AF (paroxysmal atrial fibrillation) (HCC)   DVT (deep venous thrombosis) (HCC)   S/P BKA (below knee amputation) unilateral, right (HCC)   Gangrene of toe of right foot (HCC)   Hypokalemia   Anemia   Sepsis due to methicillin resistant Staphylococcus aureus (MRSA) (HCC)   Proteus septicemia (HCC)   CURRENT MEDS:   . docusate sodium  1 enema Rectal QID  . escitalopram  10 mg Oral Daily  . feeding supplement (ENSURE ENLIVE)  237 mL Oral BID BM  . feeding supplement (PRO-STAT SUGAR FREE 64)  30 mL Oral BID  . furosemide  80 mg Intravenous Q8H  . pantoprazole  40 mg Oral Daily  . polyethylene glycol  17 g Oral Daily  . psyllium  1 packet Oral Daily  . senna-docusate  1 tablet Oral QHS  . verapamil  40 mg Oral Q8H    . Warfarin - Pharmacist Dosing Inpatient   Does not apply q1800    Waverly FerrariChristopher Dickson Beeper: [phone] Office: [phone] 04/18/2017

## 2017-04-18 NOTE — Progress Notes (Signed)
ANTICOAGULATION & ANTIBIOTIC CONSULT NOTE - Follow Up Consult  Pharmacy Consult:  Coumadin + Cubicin Indication:  History of VTE/Afib + bacteremia/osteomyelitis  Allergies  Allergen Reactions  . Sulfonamide Derivatives Hives  . Tramadol Itching    Patient Measurements: Height: 5\' 11"  (180.3 cm) Weight: 189 lb 2.5 oz (85.8 kg) IBW/kg (Calculated) : 75.3  Vital Signs: Temp: 98.8 F (37.1 C) (10/23 0500) Temp Source: Oral (10/23 0500) BP: 125/71 (10/23 0500) Pulse Rate: 76 (10/23 0500)  Labs:  Recent Labs  04/16/17 0545 04/17/17 0533 04/18/17 0542  HGB 9.3* 9.5*  --   HCT 29.4* 30.4*  --   PLT 202 239  --   LABPROT 27.6* 29.9* 35.5*  INR 2.60 2.87 3.58  HEPARINUNFRC 0.29* 0.42  --   CREATININE 1.45* 1.33* 1.53*  CKTOTAL  --   --  103    Estimated Creatinine Clearance: 39.6 mL/min (A) (by C-G formula based on SCr of 1.53 mg/dL (H)).    Assessment: [age] YOM with MRSA and Proteus bacteremia and osteomyelitis now s/p BKA.  Currently on Cubicin and ceftriaxone.  Patient's renal function is fluctuating, currently rising.  He is afebrile and his WBC is stable at 12.1.  Baseline CK is WNL.  Patient reports having muscle aches and neuropathy for multiple years and they haven't worsened since Cubicin started.  Vanc 10/15>> 10/22 Cefepime 10/15>>10/18 Flagyl 10/15>>10/18 CTX 10/18>> (10/24) Cubicin 10/22 >> (11/14)  10/19 VT = 32 on 1g q12 >> 1g q24  10/23 CK = 103  10/15 BCx: MRSA + Proteus (pan sens) 10/16 BCx: negative   Pharmacy also managing Coumadin for history of VTE and Afib.  INR increased to supra-therapeutic level despite lower Coumadin doses compared to home dose.  Noted that Cubicin may falsely elevate INR.  No bleeding report and CBC is stabilizing s/p transfusion.  Home Coumadin dose:  5mg  PO daily   Goal of Therapy:  INR 2-3 Heparin level 0.3-0.7 units/ml Monitor platelets by anticoagulation protocol: Yes     Plan:  Hold Coumadin today Daily  PT / INR F/U KCL supplementation  Continue Cubicin 680mg  IV Q24H (~8 mg/kg) CTX 2gm IV Q24H per MD Monitor renal fxn, CK q-Tues   Wanza Szumski D. Laney Potashang, PharmD, BCPS Pager:  [phone] 04/18/2017, 9:59 AM

## 2017-04-18 NOTE — Progress Notes (Signed)
[facility] Progress Note  Subjective: CXR yest showed marked CHF/ pulm edema.  Pt remains focused on needing to have a BM and needing to get up to commode.  D/W pt , says he takes miralax/ psyllium/ senokot daily at home and prn enemas given to himself by himself frequently.  Slight SOB, slight CP.    Vitals:   04/17/17 1300 04/17/17 2100 04/18/17 0030 04/18/17 0500  BP: (!) 111/54 113/64 (!) 108/56 125/71  Pulse: (!) 54 65 62 76  Resp: 16 17    Temp: 98.1 F (36.7 C) 97.9 F (36.6 C)  98.8 F (37.1 C)  TempSrc: Oral Oral  Oral  SpO2: 96% 92%  96%  Weight:    85.8 kg (189 lb 2.5 oz)  Height:        Inpatient medications: . docusate sodium  100 mg Oral BID  . docusate sodium  1 enema Rectal Daily  . escitalopram  10 mg Oral Daily  . feeding supplement (ENSURE ENLIVE)  237 mL Oral BID BM  . feeding supplement (PRO-STAT SUGAR FREE 64)  30 mL Oral BID  . furosemide  80 mg Intravenous Q8H  . pantoprazole  40 mg Oral Daily  . polyethylene glycol  17 g Oral Daily  . psyllium  1 packet Oral Daily  . senna-docusate  1 tablet Oral QHS  . verapamil  40 mg Oral Q8H  . Warfarin - Pharmacist Dosing Inpatient   Does not apply q1800   . cefTRIAXone (ROCEPHIN)  IV Stopped (04/17/17 2257)  . DAPTOmycin (CUBICIN)  IV Stopped (04/17/17 1554)   acetaminophen **OR** acetaminophen, guaiFENesin-dextromethorphan, ipratropium-albuterol, magnesium hydroxide, ondansetron, oxyCODONE-acetaminophen, phenol, sodium chloride  Exam: Alert, ox 3, no distress, eldelry and somewhat frail No jvd Chest +rales bilat lower RRR no mrg Abd soft ntnd no hsm Ext R AKA, L leg 1+ edema NF, Ox 3   Home meds: -lexapro/ percocet 10-325 prn -lasix 80 qam/ verapamil 120 SR qd/ KDur 10 qd -coumadin 5 qd -MOM prn/ miralax prn/ senokot hs/ psyllium qd -Calcium po  Impression: 1. MRSA and proteus bacteremia - sec to gangrene R foot. Now SP R AKA (POD#5). Needs 2 wks abx for Proteus and 4 wks of MRSA therapy  (was Vanc > now Dapto) since unable to do TEE per ID. Start date 10/18.   2. Chronic afib/ hx DVT on long-term coumadin - INR up on coumadin, per pharm, off hep now  3. Anemia of ABL/ chronic disease - transfused 2u prbc on 10/20, Hb up 9- 10 4. Acute/ chronic renal failure - creat up slightly prob due to decomp CHF 5. HTN - BP's soft, will change 24 hr verapamil to 40 tid 6. CHF decomp - mod severe by cxr yest, needs aggressive diuresis, place foley for 2-3 days 7. COPD - alb prn, not on home O2 8. Chronic reflux/ dysphagia - stable 9. Constipation - still not good. Will resume his home regimen of daily miralax/ senokot/ psyllium with prn mini and/or tap water enemas. Decrease percocet q 4 to q 6hrs, taper more as tolerated if needed.  10. Full code - spoke with pt about code status at son's request.  Patient said he would want initial resuscitation but "if it's not working, let me go".  Son would like to know if we can't get a living will, something "in writing", I will check into it.   11. Dispo - CIR vs SNF.  CIR evaluating, not ready medically   Plan - as  above   Vinson Moselle MD [facility] pgr [phone] 04/18/2017, 10:33 AM    Recent Labs Lab 04/16/17 0545 04/17/17 0533 04/18/17 0542  NA 132* 131* 134*  K 3.9 3.6 2.8*  CL 101 99* 95*  CO2 20* 24 26  GLUCOSE 94 97 108*  BUN 34* 36* 39*  CREATININE 1.45* 1.33* 1.53*  CALCIUM 7.9* 8.0* 8.1*    Recent Labs Lab 04/15/17 0017 04/16/17 0545 04/17/17 0533  AST 37 25 29  ALT 22 18 18   ALKPHOS 71 77 85  BILITOT 0.3 0.7 0.4  PROT 4.8* 5.2* 5.3*  ALBUMIN 1.8* 2.0* 1.9*    Recent Labs Lab 04/13/17 0458 04/14/17 0634 04/15/17 0017 04/16/17 0545 04/17/17 0533  WBC 5.1 8.1 9.5 12.2* 12.1*  NEUTROABS 4.0 6.8 8.1*  --   --   HGB 7.8* 7.9* 7.1* 9.3* 9.5*  HCT 24.7* 25.7* 22.1* 29.4* 30.4*  MCV 84.6 86.2 85.3 85.2 86.4  PLT 143* 168 162 202 239   Iron/TIBC/Ferritin/ %Sat No results found for: IRON,  TIBC, FERRITIN, IRONPCTSAT

## 2017-04-18 NOTE — Progress Notes (Signed)
Peripherally Inserted Central Catheter/Midline Placement  The IV Nurse has discussed with the patient and/or persons authorized to consent for the patient, the purpose of this procedure and the potential benefits and risks involved with this procedure.  The benefits include less needle sticks, lab draws from the catheter, and the patient may be discharged home with the catheter. Risks include, but not limited to, infection, bleeding, blood clot (thrombus formation), and puncture of an artery; nerve damage and irregular heartbeat and possibility to perform a PICC exchange if needed/ordered by physician.  Alternatives to this procedure were also discussed.  Bard Power PICC patient education guide, fact sheet on infection prevention and patient information card has been provided to patient /or left at bedside.    PICC/Midline Placement Documentation  PICC Single Lumen 04/18/17 PICC Right Brachial 44 cm 0 cm (Active)  Indication for Insertion or Continuance of Line Home intravenous therapies (PICC only) 04/18/2017 12:00 PM  Exposed Catheter (cm) 0 cm 04/18/2017 12:00 PM  Site Assessment Clean;Dry;Intact 04/18/2017 12:00 PM  Line Status Flushed;Blood return noted 04/18/2017 12:00 PM  Dressing Type Transparent 04/18/2017 12:00 PM  Dressing Status Clean;Dry;Intact;Antimicrobial disc in place 04/18/2017 12:00 PM  Dressing Intervention New dressing 04/18/2017 12:00 PM  Dressing Change Due 04/25/17 04/18/2017 12:00 PM       Stacie GlazeJoyce, Jeison Delpilar Horton 04/18/2017, 12:20 PM

## 2017-04-18 NOTE — Progress Notes (Signed)
Patient expressed interest in Living Will and HCPOA.  He wanted to discuss with his sons and then possibly complete. I provide the information for him to review and asked him to request a Chaplain for follow up.   Dyke MaesHamilton, Kentley Cedillo E Chaplain

## 2017-04-18 NOTE — Social Work (Signed)
CSW met with patient and provided bed offers for SNF. Pt indicated that he will discuss with son that is on his way to hospital now.  CSW will f/u tomorrow on bed offers.   , LCSW Clinical Social Worker [phone]   

## 2017-04-19 ENCOUNTER — Telehealth: Payer: Self-pay | Admitting: Nurse Practitioner

## 2017-04-19 ENCOUNTER — Encounter: Payer: Self-pay | Admitting: Nurse Practitioner

## 2017-04-19 ENCOUNTER — Ambulatory Visit: Payer: Medicare Other | Admitting: Nurse Practitioner

## 2017-04-19 LAB — BASIC METABOLIC PANEL
ANION GAP: 11 (ref 5–15)
BUN: 40 mg/dL — ABNORMAL HIGH (ref 6–20)
CHLORIDE: 95 mmol/L — AB (ref 101–111)
CO2: 31 mmol/L (ref 22–32)
CREATININE: 1.33 mg/dL — AB (ref 0.61–1.24)
Calcium: 8 mg/dL — ABNORMAL LOW (ref 8.9–10.3)
GFR calc non Af Amer: 48 mL/min — ABNORMAL LOW (ref >60)
GFR, EST AFRICAN AMERICAN: 56 mL/min — AB (ref >60)
Glucose, Bld: 107 mg/dL — ABNORMAL HIGH (ref 65–99)
Potassium: 3.1 mmol/L — ABNORMAL LOW (ref 3.5–5.1)
Sodium: 137 mmol/L (ref 135–145)

## 2017-04-19 LAB — PROTIME-INR
INR: 3.47
Prothrombin Time: 34.6 seconds — ABNORMAL HIGH (ref 11.4–15.2)

## 2017-04-19 LAB — GLUCOSE, CAPILLARY: Glucose-Capillary: 111 mg/dL — ABNORMAL HIGH (ref 65–99)

## 2017-04-19 MED ORDER — POTASSIUM CHLORIDE 20 MEQ PO PACK
40.0000 meq | PACK | Freq: Every day | ORAL | Status: DC
  Administered 2017-04-19: 40 meq via ORAL
  Filled 2017-04-19 (×2): qty 2

## 2017-04-19 MED ORDER — FUROSEMIDE 10 MG/ML IJ SOLN: 80.0000 mg | Freq: Every day | INTRAMUSCULAR | Status: DC

## 2017-04-19 NOTE — Progress Notes (Signed)
Inpatient Rehabilitation  Note that PT has now changed post acute rehab recommendations to SNF and given family's concerns about home accessibility with a wheelchair as well as a lack of caregiver support this may be the most appropriate option.  Will sign off at this time.  Call if questions.   Charlane FerrettiMelissa Addi Pak, M.A., CCC/SLP Admission Coordinator  [facility]  Cell [phone]

## 2017-04-19 NOTE — Progress Notes (Signed)
Physical Therapy Treatment Patient Details Name: [name] [name] MRN: [id number] DOB: [birth year] Today's Date: 04/19/2017    History of Present Illness 83 y.o. male with history of alcohol abuse, atrial fibrillation maintained on Coumadin, HTN, CKD stage III, COPD and peripheral vascular disease. Per chart review patient lives with elderly wife who has dementia. Pt presented 04/10/2017 with ischemic right lower extremity gangrenous changes followed by vascular surgery. Recent aortogram showed bilateral popliteal artery occlusions with unreconstructable tibial disease bilaterally. Underwent right BKA 04/13/2017     PT Comments    Patient progressing some with transfers especially on air bed easier transition scooting towards EOB to get to chair.  Still unable to stand fully due to L LE weakness and over time more weak unable to stand for hygiene so performed while in maximove.  Continues to require skilled PT in the acute setting to progress mobility and anticipate d/c to post acute inpatient rehab prior to d/c home.    Follow Up Recommendations  SNF     Equipment Recommendations  Wheelchair (measurements PT);Wheelchair cushion (measurements PT)    Recommendations for Other Services       Precautions / Restrictions Precautions Precautions: Fall Restrictions Weight Bearing Restrictions: Yes RLE Weight Bearing: Non weight bearing Other Position/Activity Restrictions: no formal wt bearing orders however maintained NWB in RLE     Mobility  Bed Mobility Overal bed mobility: Needs Assistance Bed Mobility: Supine to Sit     Supine to sit: Mod assist;+2 for physical assistance     General bed mobility comments: assist to scoot hips and lift shoulders, pt able to help about 30%  Transfers Overall transfer level: Needs assistance Equipment used: Rolling walker (2 wheeled) Transfers: Sit to/from [employer] Sit to Stand: Max assist;+2 physical assistance;+2  safety/equipment   Squat pivot transfers: Mod assist;Max assist;+2 physical assistance     General transfer comment: sit to stand (squat) with walker at toilet for RN to place (mini enema) but pt unable to stand erect and max A +2 for standing briefly; squat pivot into w/c initially mod A +2 pt scooting with UE support to EOB then pivot to L with cues for hand placement, pivot to [facility] over toilet +2 max A; maximove for transfer from [facility] over toilet to chair with hygiene performed while in sling  Ambulation/Gait                 Research scientist (physical sciences) Wheelchair mobility: Yes Wheelchair propulsion: Both upper extremities;Left lower extremity Wheelchair parts: Needs assistance Distance: 125 Wheelchair Assistance Details (indicate cue type and reason): S for w/c propulsion, rest breaks needed due to back pain  Modified Rankin (Stroke Patients Only)       Balance Overall balance assessment: Needs assistance Sitting-balance support: Feet supported Sitting balance-Leahy Scale: Fair Sitting balance - Comments: L LE on floor edge of bed S due to air mattress   Standing balance support: Bilateral upper extremity supported Standing balance-Leahy Scale: Zero Standing balance comment: unable to stand erect with walker and +2 max A                            Cognition Arousal/Alertness: Awake/alert Behavior During Therapy: WFL for tasks assessed/performed                         Memory: Decreased short-term memory  Following Commands: Follows one step commands with increased time Safety/Judgement: Decreased awareness of deficits            Exercises      General Comments General comments (skin integrity, edema, etc.): on RA initially SpO2 86% replaced O2 at 2LPM SpO2 90%; pt + BM (small) on toilet, assist for hygiene and applying cream while in maximove      Pertinent Vitals/Pain Faces Pain Scale: Hurts even  more Pain Location: L lower back Pain Descriptors / Indicators: Aching;Guarding;Sore Pain Intervention(s): Monitored during session;Repositioned    Home Living                      Prior Function            PT Goals (current goals can now be found in the care plan section) Progress towards PT goals: Progressing toward goals    Frequency    Min 3X/week      PT Plan Discharge plan needs to be updated    Co-evaluation              AM-PAC PT "6 Clicks" Daily Activity  Outcome Measure  Difficulty turning over in bed (including adjusting bedclothes, sheets and blankets)?: Unable Difficulty moving from lying on back to sitting on the side of the bed? : Unable Difficulty sitting down on and standing up from a chair with arms (e.g., wheelchair, bedside commode, etc,.)?: Unable Help needed moving to and from a bed to chair (including a wheelchair)?: A Lot Help needed walking in hospital room?: Total Help needed climbing 3-5 steps with a railing? : Total 6 Click Score: 7    End of Session Equipment Utilized During Treatment: Gait belt;Oxygen Activity Tolerance: Patient limited by pain;Patient limited by fatigue Patient left: in chair;with call bell/phone within reach;with chair alarm set Nurse Communication: Mobility status;Need for lift equipment PT Visit Diagnosis: Pain;Other abnormalities of gait and mobility (R26.89);Muscle weakness (generalized) (M62.81) Pain - Right/Left: Right Pain - part of body: Leg     Time: [id number] PT Time Calculation (min) (ACUTE ONLY): 65 min  Charges:  $Therapeutic Activity: 38-52 mins $Wheel Chair Management: 8-22 mins                    G CodesSheran Lawless:       [name], [place]PT [phone] 04/19/2017    [name] 04/19/2017, 11:26 AM

## 2017-04-19 NOTE — Progress Notes (Signed)
[facility] Progress Note   84 wm Severe peripheral vascular disease             09/2015-aortogram = bilateral popliteal occlusions without reconstructable tibial disease-recommended BKA             Known chronic osteomyelitis--treated 11/16/16 admission Chronic pain Anemia, normocytic? Chronic component Chronic dysphagia-EGD 10/01/15-pill-induced-lost to follow-up and did not go to [facility] manometry DVT complicated by prior subdural Chronic A. Fib [place] score >4 Bipolar COPD not on home oxygen Previous lumbar fractures L3, L4 in 2013 status post kyphoplasty Pulmonary hypertension based on cardiac cath10/2018  Represented to [facility] emergency room 10/15 worsening lower extremity wounds Admitting physician transfer patient to [facility] for consideration of operative management   Cardiology, infectious disease consulted--could not get TEE as difficulty swallowing food Wounds grew MRSA and Proteus bacteria Underwent right BKA 10/18--vancomycin was changed to daptomycin endocarditis dosing PICC line placed    Subjective:  Claims to be in 10/10 pain in his bottom Sitting in the chair but needed to be moved there by left Breathing seems better No chest pain Just been medicated for pain 1 bowel movement yesterday No other issues at this time  Vitals:   04/18/17 1300 04/18/17 1946 04/19/17 0500 04/19/17 0555  BP: 118/64 (!) 108/50  123/70  Pulse: 64 93  72  Resp: 17 20  15   Temp: 98.5 F (36.9 C) 98 F (36.7 C)  (!) 97 F (36.1 C)  TempSrc: Oral Oral  Axillary  SpO2: 95% 96%  96%  Weight:   85.8 kg (189 lb 2.5 oz)   Height:        Inpatient medications: . docusate sodium  1 enema Rectal QID  . escitalopram  10 mg Oral Daily  . feeding supplement (ENSURE ENLIVE)  237 mL Oral BID BM  . feeding supplement (PRO-STAT SUGAR FREE 64)  30 mL Oral BID  . furosemide  80 mg Intravenous Q8H  . pantoprazole  40 mg Oral Daily  . polyethylene glycol  17 g Oral  Daily  . potassium chloride  40 mEq Oral Daily  . psyllium  1 packet Oral Daily  . senna-docusate  1 tablet Oral QHS  . verapamil  40 mg Oral Q8H  . Warfarin - Pharmacist Dosing Inpatient   Does not apply q1800   . cefTRIAXone (ROCEPHIN)  IV Stopped (04/18/17 2121)  . DAPTOmycin (CUBICIN)  IV Stopped (04/18/17 1643)   acetaminophen **OR** acetaminophen, guaiFENesin-dextromethorphan, ipratropium-albuterol, magnesium hydroxide, ondansetron, oxyCODONE-acetaminophen, phenol, sodium chloride, sodium chloride flush  Exam: Alert oriented x3, on oxygen Chest mild crackles only S1-S2 normal abdomen soft Right BKA, left lower extremity wrapped in gauze and not visualized today Sacral decubitus stage II with discomfort No rash External ocular movements intact Neurologically intact oriented   Home meds: -lexapro/ percocet 10-325 prn -lasix 80 qam/ verapamil 120 SR qd/ KDur 10 qd -coumadin 5 qd -MOM prn/ miralax prn/ senokot hs/ psyllium qd -Calcium po  Impression: 1. MRSA and proteus bacteremia - sec to gangrene R foot. Now SP R AKA 10/18-2 wks abx for Proteus and 4 wks of MRSA therapy (was Vanc > now Dapto) since unable to do TEE per ID.  Stop date 11/1 ceftriaxone, 11/15 daptomycin respectively  Decrease percocet q 4 to q 6hrs, taper more as tolerated if needed.  2. Chronic afib/ hx DVT on long-term coumadin - INR up on coumadin, per pharm, off hep now  3. Anemia of ABL/ chronic disease - transfused  2u prbc on 10/20, Hb up 9- 10 4. Acute/ chronic renal failure - creat up slightly prob due to decomp CHF 5. HTN - BP's less soft, continue verapamil for now 40 tid-consolidate soon 6. CHF decomp - mod severe by cxr yest, needs aggressive diuresis, place foley for 2-3 days-change to IV Lasix 80 daily 10/24 from 3 times daily 7. COPD - alb prn, not on home O2 8. Chronic reflux/ dysphagia - stable 9. Constipation - still not good. Will resume his home regimen of daily miralax/ senokot/ psyllium  with prn mini and/or tap water enemas.  10. Full code - spoke with pt about code status at son's request.  Patient said he would want initial resuscitation but "if it's not working, let me go".  Chaplain follow-up requested 11. Dispo -skilled facility placement   Plan - as above   Pleas [name] Jerelle Virden, MD [facility] [phone]    Recent Labs Lab 04/17/17 0533 04/18/17 0542 04/19/17 0427  NA 131* 134* 137  K 3.6 2.8* 3.1*  CL 99* 95* 95*  CO2 24 26 31   GLUCOSE 97 108* 107*  BUN 36* 39* 40*  CREATININE 1.33* 1.53* 1.33*  CALCIUM 8.0* 8.1* 8.0*    Recent Labs Lab 04/15/17 0017 04/16/17 0545 04/17/17 0533  AST 37 25 29  ALT 22 18 18   ALKPHOS 71 77 85  BILITOT 0.3 0.7 0.4  PROT 4.8* 5.2* 5.3*  ALBUMIN 1.8* 2.0* 1.9*    Recent Labs Lab 04/13/17 0458 04/14/17 0634 04/15/17 0017 04/16/17 0545 04/17/17 0533  WBC 5.1 8.1 9.5 12.2* 12.1*  NEUTROABS 4.0 6.8 8.1*  --   --   HGB 7.8* 7.9* 7.1* 9.3* 9.5*  HCT 24.7* 25.7* 22.1* 29.4* 30.4*  MCV 84.6 86.2 85.3 85.2 86.4  PLT 143* 168 162 202 239   Iron/TIBC/Ferritin/ %Sat No results found for: IRON, TIBC, FERRITIN, IRONPCTSAT

## 2017-04-19 NOTE — Social Work (Addendum)
CSW met with pt family at bedside to discuss bed offers. Family would like CSW to send offers to SNF's in [place].  CSW will f/u.  Elissa Hefty, LCSW Clinical Social Worker [phone]

## 2017-04-19 NOTE — Progress Notes (Signed)
ANTICOAGULATION CONSULT NOTE - Follow Up Consult  Pharmacy Consult:  Coumadin Indication:  History of VTE/Afib  Allergies  Allergen Reactions  . Sulfonamide Derivatives Hives  . Tramadol Itching    Patient Measurements: Height: 5\' 11"  (180.3 cm) Weight: 189 lb 2.5 oz (85.8 kg) IBW/kg (Calculated) : 75.3  Vital Signs: Temp: 97 F (36.1 C) (10/24 0555) Temp Source: Axillary (10/24 0555) BP: 123/70 (10/24 0555) Pulse Rate: 72 (10/24 0555)  Labs:  Recent Labs  04/17/17 0533 04/18/17 0542 04/19/17 0427  HGB 9.5*  --   --   HCT 30.4*  --   --   PLT 239  --   --   LABPROT 29.9* 35.5* 34.6*  INR 2.87 3.58 3.47  HEPARINUNFRC 0.42  --   --   CREATININE 1.33* 1.53* 1.33*  CKTOTAL  --  103  --     Estimated Creatinine Clearance: 45.6 mL/min (A) (by C-G formula based on SCr of 1.33 mg/dL (H)).    Assessment: [age] YOM continues on Coumadin for history of VTE and Afib.  INR remains supra-therapeutic but trending down.  Noted that Cubicin may falsely elevate INR.  No bleeding report and CBC is stabilizing s/p transfusion.  Home Coumadin dose:  5mg  PO daily   Goal of Therapy:  INR 2 - 3 Monitor platelets by anticoagulation protocol: Yes     Plan:  Hold Coumadin today Daily PT / INR F/U KCL supplementation   Avyon Herendeen D. Laney Potashang, PharmD, BCPS Pager:  [phone] 04/19/2017, 8:47 AM

## 2017-04-19 NOTE — Telephone Encounter (Signed)
PATIENT WAS A NO SHOW AND LETTER SENT  °

## 2017-04-19 NOTE — Social Work (Signed)
CSW reviewed bed offers and [facility] declined patient. [facility]. [facility] and admissions indicated she will review and get back to CSW.  CSW will f/u on same for discharge tomorrow.  Keene BreathPatricia Aleathea Pugmire, LCSW Clinical Social Worker [phone]

## 2017-04-20 DIAGNOSIS — G890 Central pain syndrome: Secondary | ICD-10-CM | POA: Diagnosis not present

## 2017-04-20 DIAGNOSIS — M6281 Muscle weakness (generalized): Secondary | ICD-10-CM | POA: Diagnosis not present

## 2017-04-20 DIAGNOSIS — J441 Chronic obstructive pulmonary disease with (acute) exacerbation: Secondary | ICD-10-CM | POA: Diagnosis not present

## 2017-04-20 DIAGNOSIS — E876 Hypokalemia: Secondary | ICD-10-CM | POA: Diagnosis not present

## 2017-04-20 DIAGNOSIS — J449 Chronic obstructive pulmonary disease, unspecified: Secondary | ICD-10-CM | POA: Diagnosis not present

## 2017-04-20 DIAGNOSIS — S32018D Other fracture of first lumbar vertebra, subsequent encounter for fracture with routine healing: Secondary | ICD-10-CM | POA: Diagnosis not present

## 2017-04-20 DIAGNOSIS — R9389 Abnormal findings on diagnostic imaging of other specified body structures: Secondary | ICD-10-CM | POA: Diagnosis not present

## 2017-04-20 DIAGNOSIS — K220 Achalasia of cardia: Secondary | ICD-10-CM | POA: Diagnosis not present

## 2017-04-20 DIAGNOSIS — M4317 Spondylolisthesis, lumbosacral region: Secondary | ICD-10-CM | POA: Diagnosis not present

## 2017-04-20 DIAGNOSIS — D649 Anemia, unspecified: Secondary | ICD-10-CM | POA: Diagnosis not present

## 2017-04-20 DIAGNOSIS — Z885 Allergy status to narcotic agent status: Secondary | ICD-10-CM | POA: Diagnosis not present

## 2017-04-20 DIAGNOSIS — R2689 Other abnormalities of gait and mobility: Secondary | ICD-10-CM | POA: Diagnosis not present

## 2017-04-20 DIAGNOSIS — I70223 Atherosclerosis of native arteries of extremities with rest pain, bilateral legs: Secondary | ICD-10-CM | POA: Diagnosis not present

## 2017-04-20 DIAGNOSIS — Z7982 Long term (current) use of aspirin: Secondary | ICD-10-CM | POA: Diagnosis not present

## 2017-04-20 DIAGNOSIS — Z89519 Acquired absence of unspecified leg below knee: Secondary | ICD-10-CM | POA: Diagnosis not present

## 2017-04-20 DIAGNOSIS — S88019A Complete traumatic amputation at knee level, unspecified lower leg, initial encounter: Secondary | ICD-10-CM | POA: Diagnosis not present

## 2017-04-20 DIAGNOSIS — S22058D Other fracture of T5-T6 vertebra, subsequent encounter for fracture with routine healing: Secondary | ICD-10-CM | POA: Diagnosis not present

## 2017-04-20 DIAGNOSIS — S22080D Wedge compression fracture of T11-T12 vertebra, subsequent encounter for fracture with routine healing: Secondary | ICD-10-CM | POA: Diagnosis not present

## 2017-04-20 DIAGNOSIS — M47816 Spondylosis without myelopathy or radiculopathy, lumbar region: Secondary | ICD-10-CM | POA: Diagnosis not present

## 2017-04-20 DIAGNOSIS — Z96641 Presence of right artificial hip joint: Secondary | ICD-10-CM | POA: Diagnosis not present

## 2017-04-20 DIAGNOSIS — R0989 Other specified symptoms and signs involving the circulatory and respiratory systems: Secondary | ICD-10-CM | POA: Diagnosis not present

## 2017-04-20 DIAGNOSIS — Z79899 Other long term (current) drug therapy: Secondary | ICD-10-CM | POA: Diagnosis not present

## 2017-04-20 DIAGNOSIS — G8929 Other chronic pain: Secondary | ICD-10-CM | POA: Diagnosis not present

## 2017-04-20 DIAGNOSIS — I480 Paroxysmal atrial fibrillation: Secondary | ICD-10-CM | POA: Diagnosis not present

## 2017-04-20 DIAGNOSIS — Z882 Allergy status to sulfonamides status: Secondary | ICD-10-CM | POA: Diagnosis not present

## 2017-04-20 DIAGNOSIS — S22088D Other fracture of T11-T12 vertebra, subsequent encounter for fracture with routine healing: Secondary | ICD-10-CM | POA: Diagnosis not present

## 2017-04-20 DIAGNOSIS — A4102 Sepsis due to Methicillin resistant Staphylococcus aureus: Secondary | ICD-10-CM | POA: Diagnosis not present

## 2017-04-20 DIAGNOSIS — M546 Pain in thoracic spine: Secondary | ICD-10-CM | POA: Diagnosis not present

## 2017-04-20 DIAGNOSIS — M1612 Unilateral primary osteoarthritis, left hip: Secondary | ICD-10-CM | POA: Diagnosis not present

## 2017-04-20 DIAGNOSIS — I82409 Acute embolism and thrombosis of unspecified deep veins of unspecified lower extremity: Secondary | ICD-10-CM | POA: Diagnosis not present

## 2017-04-20 DIAGNOSIS — M545 Low back pain: Secondary | ICD-10-CM | POA: Diagnosis not present

## 2017-04-20 DIAGNOSIS — M5136 Other intervertebral disc degeneration, lumbar region: Secondary | ICD-10-CM | POA: Diagnosis not present

## 2017-04-20 DIAGNOSIS — I10 Essential (primary) hypertension: Secondary | ICD-10-CM | POA: Diagnosis not present

## 2017-04-20 DIAGNOSIS — Z87891 Personal history of nicotine dependence: Secondary | ICD-10-CM | POA: Diagnosis not present

## 2017-04-20 DIAGNOSIS — A4189 Other specified sepsis: Secondary | ICD-10-CM | POA: Diagnosis not present

## 2017-04-20 DIAGNOSIS — Z4781 Encounter for orthopedic aftercare following surgical amputation: Secondary | ICD-10-CM | POA: Diagnosis not present

## 2017-04-20 DIAGNOSIS — S32010D Wedge compression fracture of first lumbar vertebra, subsequent encounter for fracture with routine healing: Secondary | ICD-10-CM | POA: Diagnosis not present

## 2017-04-20 DIAGNOSIS — G894 Chronic pain syndrome: Secondary | ICD-10-CM | POA: Diagnosis not present

## 2017-04-20 DIAGNOSIS — F329 Major depressive disorder, single episode, unspecified: Secondary | ICD-10-CM | POA: Diagnosis not present

## 2017-04-20 DIAGNOSIS — M5134 Other intervertebral disc degeneration, thoracic region: Secondary | ICD-10-CM | POA: Diagnosis not present

## 2017-04-20 DIAGNOSIS — B999 Unspecified infectious disease: Secondary | ICD-10-CM | POA: Diagnosis not present

## 2017-04-20 DIAGNOSIS — A419 Sepsis, unspecified organism: Secondary | ICD-10-CM | POA: Diagnosis not present

## 2017-04-20 DIAGNOSIS — R6520 Severe sepsis without septic shock: Secondary | ICD-10-CM | POA: Diagnosis not present

## 2017-04-20 DIAGNOSIS — S22068D Other fracture of T7-T8 thoracic vertebra, subsequent encounter for fracture with routine healing: Secondary | ICD-10-CM | POA: Diagnosis not present

## 2017-04-20 DIAGNOSIS — Z8614 Personal history of Methicillin resistant Staphylococcus aureus infection: Secondary | ICD-10-CM | POA: Diagnosis not present

## 2017-04-20 DIAGNOSIS — S32058D Other fracture of fifth lumbar vertebra, subsequent encounter for fracture with routine healing: Secondary | ICD-10-CM | POA: Diagnosis not present

## 2017-04-20 DIAGNOSIS — R1311 Dysphagia, oral phase: Secondary | ICD-10-CM | POA: Diagnosis not present

## 2017-04-20 DIAGNOSIS — Z888 Allergy status to other drugs, medicaments and biological substances status: Secondary | ICD-10-CM | POA: Diagnosis not present

## 2017-04-20 DIAGNOSIS — S32050D Wedge compression fracture of fifth lumbar vertebra, subsequent encounter for fracture with routine healing: Secondary | ICD-10-CM | POA: Diagnosis not present

## 2017-04-20 DIAGNOSIS — Z886 Allergy status to analgesic agent status: Secondary | ICD-10-CM | POA: Diagnosis not present

## 2017-04-20 LAB — RENAL FUNCTION PANEL
ANION GAP: 11 (ref 5–15)
Albumin: 1.8 g/dL — ABNORMAL LOW (ref 3.5–5.0)
BUN: 41 mg/dL — ABNORMAL HIGH (ref 6–20)
CALCIUM: 7.7 mg/dL — AB (ref 8.9–10.3)
CHLORIDE: 94 mmol/L — AB (ref 101–111)
CO2: 31 mmol/L (ref 22–32)
Creatinine, Ser: 1.2 mg/dL (ref 0.61–1.24)
GFR calc non Af Amer: 54 mL/min — ABNORMAL LOW (ref >60)
GLUCOSE: 104 mg/dL — AB (ref 65–99)
POTASSIUM: 2.7 mmol/L — AB (ref 3.5–5.1)
Phosphorus: 3.2 mg/dL (ref 2.5–4.6)
Sodium: 136 mmol/L (ref 135–145)

## 2017-04-20 LAB — CBC
HEMATOCRIT: 28.8 % — AB (ref 39.0–52.0)
HEMOGLOBIN: 8.9 g/dL — AB (ref 13.0–17.0)
MCH: 26.9 pg (ref 26.0–34.0)
MCHC: 30.9 g/dL (ref 30.0–36.0)
MCV: 87 fL (ref 78.0–100.0)
Platelets: 279 10*3/uL (ref 150–400)
RBC: 3.31 MIL/uL — AB (ref 4.22–5.81)
RDW: 17.2 % — ABNORMAL HIGH (ref 11.5–15.5)
WBC: 11 10*3/uL — ABNORMAL HIGH (ref 4.0–10.5)

## 2017-04-20 LAB — PROTIME-INR
INR: 3.08
PROTHROMBIN TIME: 31.6 s — AB (ref 11.4–15.2)

## 2017-04-20 MED ORDER — POTASSIUM CHLORIDE CRYS ER 20 MEQ PO TBCR: 40.0000 meq | EXTENDED_RELEASE_TABLET | Freq: Two times a day (BID) | ORAL | Status: DC

## 2017-04-20 MED ORDER — POTASSIUM CHLORIDE 20 MEQ PO PACK
40.0000 meq | PACK | Freq: Two times a day (BID) | ORAL | Status: DC
  Filled 2017-04-20: qty 2

## 2017-04-20 MED ORDER — WARFARIN SODIUM 1 MG PO TABS
1.0000 mg | ORAL_TABLET | Freq: Once | ORAL | Status: AC
  Administered 2017-04-20: 1 mg via ORAL
  Filled 2017-04-20: qty 1

## 2017-04-20 MED ORDER — VERAPAMIL HCL 80 MG PO TABS: 40.0000 mg | ORAL_TABLET | Freq: Two times a day (BID) | ORAL | Status: DC

## 2017-04-20 MED ORDER — OXYCODONE-ACETAMINOPHEN 5-325 MG PO TABS: 1.0000 | ORAL_TABLET | Freq: Four times a day (QID) | ORAL | 0 refills | Status: DC | PRN

## 2017-04-20 MED ORDER — HEPARIN SOD (PORK) LOCK FLUSH 100 UNIT/ML IV SOLN
250.0000 [IU] | INTRAVENOUS | Status: AC | PRN
  Administered 2017-04-20: 250 [IU]

## 2017-04-20 MED ORDER — FUROSEMIDE 40 MG PO TABS
80.0000 mg | ORAL_TABLET | Freq: Every day | ORAL | Status: DC
  Administered 2017-04-20: 80 mg via ORAL
  Filled 2017-04-20: qty 2

## 2017-04-20 MED ORDER — POTASSIUM CHLORIDE CRYS ER 20 MEQ PO TBCR
40.0000 meq | EXTENDED_RELEASE_TABLET | Freq: Once | ORAL | Status: AC
  Administered 2017-04-20: 40 meq via ORAL
  Filled 2017-04-20: qty 2

## 2017-04-20 MED ORDER — PANTOPRAZOLE SODIUM 40 MG PO TBEC: 40.0000 mg | DELAYED_RELEASE_TABLET | Freq: Every day | ORAL | 0 refills | Status: DC

## 2017-04-20 MED ORDER — POTASSIUM CHLORIDE CRYS ER 20 MEQ PO TBCR
40.0000 meq | EXTENDED_RELEASE_TABLET | Freq: Two times a day (BID) | ORAL | Status: DC
  Administered 2017-04-20: 40 meq via ORAL
  Filled 2017-04-20: qty 2

## 2017-04-20 MED ORDER — DEXTROSE 5 % IV SOLN: 2.0000 g | INTRAVENOUS | Status: AC

## 2017-04-20 MED ORDER — POTASSIUM CHLORIDE 10 MEQ/100ML IV SOLN
10.0000 meq | INTRAVENOUS | Status: AC
  Administered 2017-04-20 (×2): 10 meq via INTRAVENOUS
  Filled 2017-04-20 (×2): qty 100

## 2017-04-20 MED ORDER — SODIUM CHLORIDE 0.9 % IV SOLN: 8.0000 mg/kg | INTRAVENOUS | Status: AC

## 2017-04-20 NOTE — Progress Notes (Signed)
CRITICAL VALUE ALERT  Critical Value: Potassium 2.7  Date & Time Notied:  10/25 , 0600  Provider Notified: [facility]  Orders Received/Actions taken: yes

## 2017-04-20 NOTE — Progress Notes (Signed)
[facility] arrived and pt d/c'd with belongings to facility. Unit secretary called son at his request when pt was leaving hospital.

## 2017-04-20 NOTE — Progress Notes (Signed)
   VASCULAR SURGERY ASSESSMENT & PLAN:   7 Days Post-Op s/p: Right below the knee amputation  Amputation site looks good.  Continue dressing changes.  I have arranged follow-up in 1 month for staple removal.    We can also follow the wounds of the left leg as an outpatient.  He had no options for revascularization on the left.  Currently the wounds are stable but if they progress he could require amputation on the left.   SUBJECTIVE:   No specific complaints.  PHYSICAL EXAM:   Vitals:   04/19/17 1947 04/19/17 2007 04/20/17 0500 04/20/17 0547  BP: 133/60   138/61  Pulse: 63   60  Resp: 15   16  Temp: 97.8 F (36.6 C)   97.9 F (36.6 C)  TempSrc: Oral   Axillary  SpO2: (!) 87% 96%  90%  Weight:   189 lb 2.5 oz (85.8 kg)   Height:       Right BKA inspected and looks fine.  LABS:   Lab Results  Component Value Date   WBC 11.0 (H) 04/20/2017   HGB 8.9 (L) 04/20/2017   HCT 28.8 (L) 04/20/2017   MCV 87.0 04/20/2017   PLT 279 04/20/2017   Lab Results  Component Value Date   CREATININE 1.20 04/20/2017   Lab Results  Component Value Date   INR 3.08 04/20/2017   CBG (last 3)   Recent Labs  04/18/17 1726 04/18/17 2155 04/19/17 0621  GLUCAP 104* 132* 111*    PROBLEM LIST:    Active Problems:   COPD (chronic obstructive pulmonary disease) (HCC)   Essential hypertension, benign   Chronic pain syndrome   AF (paroxysmal atrial fibrillation) (HCC)   DVT (deep venous thrombosis) (HCC)   S/P BKA (below knee amputation) unilateral, right (HCC)   Gangrene of toe of right foot (HCC)   Hypokalemia   Anemia   Sepsis due to methicillin resistant Staphylococcus aureus (MRSA) (HCC)   Proteus septicemia (HCC)   CURRENT MEDS:   . escitalopram  10 mg Oral Daily  . feeding supplement (ENSURE ENLIVE)  237 mL Oral BID BM  . feeding supplement (PRO-STAT SUGAR FREE 64)  30 mL Oral BID  . furosemide  80 mg Oral Daily  . pantoprazole  40 mg Oral Daily  . polyethylene  glycol  17 g Oral Daily  . potassium chloride  40 mEq Oral BID  . psyllium  1 packet Oral Daily  . senna-docusate  1 tablet Oral QHS  . verapamil  40 mg Oral Q8H  . Warfarin - Pharmacist Dosing Inpatient   Does not apply q1800    Waverly FerrariChristopher Rashmi Tallent Beeper: [phone] Office: [phone] 04/20/2017

## 2017-04-20 NOTE — Progress Notes (Signed)
Pt ready for discharge to SNF[facility]. Waiting for arrival of [facility] to transport pt to facility. PICC line capped earlier this evening after IV antibiotic given.  Pt discharging with foley cath per order (pt had refused to have removed--stated he would allow other facility to remove catheter, MD was notified).

## 2017-04-20 NOTE — Clinical Social Work Placement (Signed)
   CLINICAL SOCIAL WORK PLACEMENT  NOTE  Date:  04/20/2017  Patient Details  Name: [name] [name] MRN: [id number] Date of Birth: 02/11/1933  Clinical Social Work is seeking post-discharge placement for this patient at the Skilled  Nursing Facility level of care (*CSW will initial, date and re-position this form in  chart as items are completed):  Yes   Patient/family provided with [place] Clinical Social Work Department's list of facilities offering this level of care within the geographic area requested by the patient (or if unable, by the patient's family).  Yes   Patient/family informed of their freedom to choose among providers that offer the needed level of care, that participate in Medicare, Medicaid or managed care program needed by the patient, have an available bed and are willing to accept the patient.  Yes   Patient/family informed of [facility]'s ownership interest in [facility] and [facility], as well as of the fact that they are under no obligation to receive care at these facilities.  PASRR submitted to EDS on       PASRR number received on 04/18/17     Existing PASRR number confirmed on       FL2 transmitted to all facilities in geographic area requested by pt/family on 04/18/17     FL2 transmitted to all facilities within larger geographic area on       Patient informed that his/her managed care company has contracts with or will negotiate with certain facilities, including the following:        Yes   Patient/family informed of bed offers received.  [facility]     Physician recommends and patient chooses bed at      [facility] 04[facility].  Patient to be transferred to facility by [facility]     Patient family notified on 04/20/17 of transfer.  Name of family member notified:  son contacted     PHYSICIAN Please prepare prescriptions     Additional Comment:     _______________________________________________ Tresa MoorePatricia V Clinton Dragone, LCSW 04/20/2017, 12:19 PM

## 2017-04-20 NOTE — Progress Notes (Signed)
ANTICOAGULATION CONSULT NOTE - Follow Up Consult  Pharmacy Consult:  Coumadin Indication:  History of VTE/Afib  Allergies  Allergen Reactions  . Sulfonamide Derivatives Hives  . Tramadol Itching    Patient Measurements: Height: 5\' 11"  (180.3 cm) Weight: 189 lb 2.5 oz (85.8 kg) IBW/kg (Calculated) : 75.3  Vital Signs: Temp: 98.3 F (36.8 C) (10/25 0814) Temp Source: Oral (10/25 0814) BP: 112/58 (10/25 0814) Pulse Rate: 47 (10/25 0814)  Labs:  Recent Labs  04/18/17 0542 04/19/17 0427 04/20/17 0442  HGB  --   --  8.9*  HCT  --   --  28.8*  PLT  --   --  279  LABPROT 35.5* 34.6* 31.6*  INR 3.58 3.47 3.08  CREATININE 1.53* 1.33* 1.20  CKTOTAL 103  --   --     Estimated Creatinine Clearance: 50.5 mL/min (by C-G formula based on SCr of 1.2 mg/dL).    Assessment: [age] YOM continues on Coumadin for history of VTE and Afib.  INR trending down toward goal.  No bleeding report and CBC is stabilizing s/p transfusion.  Home Coumadin dose:  5mg  PO daily   Goal of Therapy:  INR 2 - 3 Monitor platelets by anticoagulation protocol: Yes     Plan:  Resume Coumadin 1mg  PO today Daily PT / INR   Levorn Oleski D. Laney Potashang, PharmD, BCPS Pager:  [phone] 04/20/2017, 10:01 AM

## 2017-04-20 NOTE — Telephone Encounter (Signed)
Noted  

## 2017-04-20 NOTE — Progress Notes (Addendum)
Foley clamped at 1200, at 1330 pt started feeling a "burning sensation" he states, unable to state he felt he needed to urinate, "just feel burning" ---foley unclamped, urine flowed freely.  "Burning feeling went away" pt states.     O2 sats  93-94% on RA (with good wave form on monitor).

## 2017-04-20 NOTE — Discharge Summary (Signed)
Physician Discharge Summary  [name] [name] MRN:[id number] DOB: 12/12/1932 DOA: 04/10/2017  PCP: Luking, William S, MD  Admit date: 04/10/2017 Discharge date: 04/20/2017  Time spent: 45 minutes  Recommendations for Outpatient Follow-up:  1. Complete ceftriaxone 05/07/17 for Proteus bacteremia 2. To complete vancomycin 05/11/17 for MRSA bacteremia-need CRP ESR CBC and differential checked by skilled nursing physician to monitor as well as vancomycin trough levels every week 3. Check INR in about 4-5 days 4. Check CBC and differential in about 1 week 5. Check potassium 2-3 days as it was low on discharge in the 2 range 6. Keep on proper bowel regimen may need tap water enemas versus Fleet enemas as an outpatient if no good response to MiraLAX and senna  Discharge Diagnoses:  Active Problems:   COPD (chronic obstructive pulmonary disease) (HCC)   Essential hypertension, benign   Chronic pain syndrome   AF (paroxysmal atrial fibrillation) (HCC)   DVT (deep venous thrombosis) (HCC)   S/P BKA (below knee amputation) unilateral, right (HCC)   Gangrene of toe of right foot (HCC)   Hypokalemia   Anemia   Sepsis due to methicillin resistant Staphylococcus aureus (MRSA) (HCC)   Proteus septicemia (HCC)   Discharge Condition: improved  Diet recommendation: low salt, htn diet  Filed Weights   04/18/17 0500 04/19/17 0500 04/20/17 0500  Weight: 85.8 kg (189 lb 2.5 oz) 85.8 kg (189 lb 2.5 oz) 85.8 kg (189 lb 2.5 oz)    History of present illness:  84 wm Severe peripheral vascular disease             09/2015-aortogram = bilateral popliteal occlusions without reconstructable tibial disease-recommended BKA             Known chronic osteomyelitis--treated 11/16/16 admission Chronic pain Anemia, normocytic? Chronic component Chronic dysphagia-EGD 10/01/15-pill-induced-lost to follow-up and did not go to [place] for manometry DVT complicated by prior subdural Chronic A. Fib Chad score  >4 Bipolar COPD not on home oxygen Previous lumbar fractures L3, L4 in 2013 status post kyphoplasty Pulmonary hypertension based on cardiac cath10/2018   Represented to [place] emergency room 10/15 worsening lower extremity wounds Admitting physician transfer patient to [place] for consideration of operative management    Cardiology, infectious disease consulted--could not get TEE as difficulty swallowing food Wounds grew MRSA and Proteus bacteria Underwent right BKA 10/18--vancomycin was changed to daptomycin endocarditis dosing PICC line placed   Hospital Course:  1. MRSA and proteus bacteremia - sec to gangrene R foot. Now SP R AKA 10/18-2 wks abx for Proteus and 4 wks of MRSA therapy (was Vanc > now Dapto) since unable to do TEE per ID.  Stop date 11/1 ceftriaxone, 11/15 daptomycin respectively  Decrease percocet q 4 to q 6hrs, taper more as tolerated if needed. script given on d/c 2. Chronic afib/ hx DVT on long-term coumadin - INR up on coumadin, per pharm, off hep now -INR 3 on d/c 3. Anemia of ABL/ chronic disease - transfused 2u prbc on 10/20, Hb up 9- 10 4. Acute/ chronic renal failure - creat up slightly prob due to decomp CHF 5. HTN - BP's less soft, continue verapamil for now 40 tid-consolidated on discharge to 40 mg twice daily 6. CHF decomp - mod severe by cxr yest, needs aggressive diuresis, place foley for 2-3 days-change to IV Lasix 80 daily 10/24 from 3 times daily--on discharge placed on Lasix 80 daily 7. Hypokalemia-potassium dropped to about 2.7 with aggressive diuresis replaced with IV runs   and placed on potassium 40 twice daily on discharge recheck labs 8. COPD - alb prn, not on home O2 9. Chronic reflux/ dysphagia - stable 10. Constipation - still not good. Will resume his home regimen of daily miralax/ senokot/ psyllium with prn mini and/or tap water enemas.  11. Full code - spoke with pt about code status at son's request.  Patient said he would want initial  resuscitation but "if it's not working, let me go".  Chaplain follow-up requested 12. Dispo -skilled facility placement   Procedures:  BKA right sided 10/18  Consultations:  Vascular surgery Dr. Doren Custard  Discharge Exam: Vitals:   04/20/17 0547 04/20/17 0814  BP: 138/61 (!) 112/58  Pulse: 60 (!) 47  Resp: 16 16  Temp: 97.9 F (36.6 C) 98.3 F (36.8 C)  SpO2: 90% 96%    Alert oriented no distress EOMI NCAT Chest is clear without issue Right BKA noted left leg examined  Discharge Instructions    Current Discharge Medication List    CONTINUE these medications which have NOT CHANGED   Details  CALCIUM PO Take 1 tablet by mouth daily.    escitalopram (LEXAPRO) 10 MG tablet Take 1 tablet (10 mg total) by mouth daily. Qty: 30 tablet, Refills: 5    furosemide (LASIX) 40 MG tablet Take 2 tablets every morning Qty: 30 tablet, Refills: 5    magnesium hydroxide (MILK OF MAGNESIA) 400 MG/5ML suspension Take 15 mLs by mouth daily as needed for mild constipation.    oxyCODONE-acetaminophen (PERCOCET) 10-325 MG tablet One tablet 4 times a day as needed for pain Qty: 120 tablet, Refills: 0    polyethylene glycol (MIRALAX / GLYCOLAX) packet Take 17 g by mouth 2 (two) times daily.    potassium chloride SA (K-DUR,KLOR-CON) 10 MEQ tablet Take 1 tablet (10 mEq total) by mouth daily. Qty: 30 tablet, Refills: 5    PSYLLIUM PO Take 1 packet by mouth daily.    senna (SENOKOT) 8.6 MG TABS tablet Take 1 tablet (8.6 mg total) by mouth at bedtime.    verapamil (CALAN-SR) 120 MG CR tablet Take 1 tablet (120 mg total) by mouth at bedtime. Qty: 30 tablet, Refills: 5    warfarin (COUMADIN) 5 MG tablet TAKE ONE TABLET BY MOUTH ONCE DAILY AT 6PM. Qty: 30 tablet, Refills: 5       Allergies  Allergen Reactions  . Sulfonamide Derivatives Hives  . Tramadol Itching      The results of significant diagnostics from this hospitalization (including imaging, microbiology, ancillary and  laboratory) are listed below for reference.    Significant Diagnostic Studies: Dg Pelvis 1-2 Views  Result Date: 04/10/2017 CLINICAL DATA:  Fever, pelvic pain. History of falls. Rule out osteomyelitis. EXAM: PELVIS - 1-2 VIEW COMPARISON:  Right hip 05/14/2013 FINDINGS: Right hip replacement in satisfactory position. Negative for fracture. No lucency surrounding the prosthesis. Negative for acute pelvic fracture. Chronic fractures with vertebroplasty at L3 and L4. IMPRESSION: No acute abnormality.  Right hip replacement. Electronically Signed   By: Franchot Gallo M.D.   On: 04/10/2017 10:50   Mr Foot Right W Wo Contrast  Result Date: 04/10/2017 CLINICAL DATA:  Multiple soft tissue ulcerations. EXAM: MRI OF THE RIGHT FOREFOOT WITHOUT AND WITH CONTRAST TECHNIQUE: Multiplanar, multisequence MR imaging of the right forefoot was performed before and after the administration of intravenous contrast. CONTRAST:  12m MULTIHANCE GADOBENATE DIMEGLUMINE 529 MG/ML IV SOLN COMPARISON:  Radiographs dated 04/10/2017 and 11/16/2016 and MRI dated 03/25/2016 FINDINGS: Bones/Joint/Cartilage & Soft  tissues There is incomplete destruction of the distal phalanx of the right great toe. There is partial destruction of the middle and distal phalanges of the third toe. There is abnormal edema in all 3 of those bones. There is no abnormal enhancement of those bones after contrast administration. However, there is also no enhancement of the soft tissues of the right great toe and there is poor soft tissue enhancement of the other toes. There is no enhancement of the soft tissues around the head of the first metatarsal with poor enhancement of the soft tissues of the ball of the foot. There is poor enhancement along the lateral aspect of the foot extending proximally to the level of the cuboid. Soft tissue ulcerations are not noted on the dorsal aspect of the forefoot. No appreciable soft tissue abscesses. IMPRESSION: 1. Osteomyelitis  of the partially destroyed distal phalanx of the great toe. 2. Osteomyelitis of the middle and distal phalangeal bones of the third toe. 3. Very poor soft tissue perfusion to all the toes and to the ball of the foot particularly around the head of the first metatarsal. Electronically Signed   By: Lorriane Shire M.D.   On: 04/10/2017 14:14   Dg Chest Port 1 View  Result Date: 04/18/2017 CLINICAL DATA:  Central line placement EXAM: PORTABLE CHEST 1 VIEW COMPARISON:  04/17/2017 FINDINGS: Right arm PICC tip is been placed the tip in the SVC in satisfactory position Diffuse bilateral airspace disease unchanged.  Cardiac enlargement IMPRESSION: PICC tip in the SVC in good position Diffuse bilateral airspace disease is unchanged. Electronically Signed   By: Franchot Gallo M.D.   On: 04/18/2017 13:01   Dg Chest Port 1 View  Result Date: 04/17/2017 CLINICAL DATA:  Shortness of Breath EXAM: PORTABLE CHEST 1 VIEW COMPARISON:  04/10/2017 FINDINGS: Cardiomegaly. Diffuse bilateral interstitial and alveolar opacities have worsened since prior study, likely worsening edema. Bibasilar atelectasis present. Possible small effusions. No acute bony abnormality. IMPRESSION: Worsening bilateral interstitial and alveolar opacities, likely worsening edema/CHF. Electronically Signed   By: Rolm Baptise M.D.   On: 04/17/2017 11:13   Dg Chest Port 1 View  Result Date: 04/10/2017 CLINICAL DATA:  Weakness. EXAM: PORTABLE CHEST 1 VIEW COMPARISON:  Radiographs of October 04, 2016. FINDINGS: Stable cardiomegaly. No pneumothorax is noted. Mild central pulmonary vascular congestion is noted. Mild bibasilar subsegmental atelectasis or infiltrates are noted. No definite pleural effusion is noted. Left midlung opacity is noted concerning for atelectasis or infiltrate. Bony thorax is unremarkable. IMPRESSION: Stable cardiomegaly with mild central pulmonary vascular congestion. Mild bibasilar subsegmental atelectasis or infiltrates are noted.  Mild left perihilar atelectasis or infiltrate is noted. Followup PA and lateral chest X-ray is recommended in 3-4 weeks following trial of antibiotic therapy to ensure resolution and exclude underlying malignancy. Electronically Signed   By: Marijo Conception, M.D.   On: 04/10/2017 09:34   Dg Foot Complete Right  Result Date: 04/10/2017 CLINICAL DATA:  Fever, shortness of Breath. Open sores on right foot EXAM: RIGHT FOOT COMPLETE - 3+ VIEW COMPARISON:  11/16/2016 FINDINGS: Near complete bone destruction noted in the distal phalanx of the right great toe with only a small amount of residual bone seen on the lateral view concerning for acute osteomyelitis. Diffuse osteopenia throughout the foot. The metatarsal heads of the second through fifth digits are poorly visualized which may be projectional, but cannot exclude bone destruction. Again noted is bone destruction about the third PIP joint compatible with osteomyelitis, progressed. IMPRESSION: Near complete bony  destruction of the distal phalanx of the right great toe compatible with osteomyelitis. Continued osteomyelitis changes at the third PIP joint. Second through fifth metatarsal heads are not well visualized which may be projectional, but cannot exclude osteomyelitis. May consider further evaluation with MRI with and without contrast. Electronically Signed   By: Kevin  Dover M.D.   On: 04/10/2017 10:52    Microbiology: Recent Results (from the past 240 hour(s))  Culture, blood (routine x 2)     Status: None   Collection Time: 04/11/17  7:03 PM  Result Value Ref Range Status   Specimen Description BLOOD LEFT ANTECUBITAL  Final   Special Requests   Final    BOTTLES DRAWN AEROBIC AND ANAEROBIC Blood Culture results may not be optimal due to an excessive volume of blood received in culture bottles   Culture NO GROWTH 5 DAYS  Final   Report Status 04/16/2017 FINAL  Final  Culture, blood (routine x 2)     Status: None   Collection Time: 04/11/17   7:08 PM  Result Value Ref Range Status   Specimen Description BLOOD LEFT HAND  Final   Special Requests   Final    BOTTLES DRAWN AEROBIC ONLY Blood Culture adequate volume   Culture NO GROWTH 5 DAYS  Final   Report Status 04/16/2017 FINAL  Final     Labs: Basic Metabolic Panel:  Recent Labs Lab 04/16/17 0545 04/17/17 0533 04/18/17 0542 04/19/17 0427 04/20/17 0442  NA 132* 131* 134* 137 136  K 3.9 3.6 2.8* 3.1* 2.7*  CL 101 99* 95* 95* 94*  CO2 20* 24 26 31 31  GLUCOSE 94 97 108* 107* 104*  BUN 34* 36* 39* 40* 41*  CREATININE 1.45* 1.33* 1.53* 1.33* 1.20  CALCIUM 7.9* 8.0* 8.1* 8.0* 7.7*  MG  --  1.7  --   --   --   PHOS  --   --   --   --  3.2   Liver Function Tests:  Recent Labs Lab 04/14/17 0634 04/15/17 0017 04/16/17 0545 04/17/17 0533 04/20/17 0442  AST 54* 37 25 29  --   ALT 22 22 18 18  --   ALKPHOS 70 71 77 85  --   BILITOT 0.4 0.3 0.7 0.4  --   PROT 5.1* 4.8* 5.2* 5.3*  --   ALBUMIN 1.9* 1.8* 2.0* 1.9* 1.8*   No results for input(s): LIPASE, AMYLASE in the last 168 hours. No results for input(s): AMMONIA in the last 168 hours. CBC:  Recent Labs Lab 04/14/17 0634 04/15/17 0017 04/16/17 0545 04/17/17 0533 04/20/17 0442  WBC 8.1 9.5 12.2* 12.1* 11.0*  NEUTROABS 6.8 8.1*  --   --   --   HGB 7.9* 7.1* 9.3* 9.5* 8.9*  HCT 25.7* 22.1* 29.4* 30.4* 28.8*  MCV 86.2 85.3 85.2 86.4 87.0  PLT 168 162 202 239 279   Cardiac Enzymes:  Recent Labs Lab 04/18/17 0542  CKTOTAL 103   BNP: BNP (last 3 results)  Recent Labs  10/04/16 0450  BNP 666.0*    ProBNP (last 3 results) No results for input(s): PROBNP in the last 8760 hours.  CBG:  Recent Labs Lab 04/16/17 2134 04/18/17 1443 04/18/17 1726 04/18/17 2155 04/19/17 0621  GLUCAP 120* 140* 104* 132* 111*       Signed:  , JAI-GURMUKH MD   [facility] 04/20/2017, 11:10 AM   

## 2017-04-20 NOTE — Social Work (Signed)
Clinical Social Worker facilitated patient discharge including contacting patient family and facility to confirm patient discharge plans.  Clinical information faxed to facility and family agreeable with plan.   CSW arranged ambulance transport via [facility] to [facility].    RN to call [phone] to give report prior to discharge.  Clinical Social Worker will sign off for now as social work intervention is no longer needed. Please consult us again if new need arises.  Orma Cheetham, LCSW Clinical Social Worker [phone]    

## 2017-04-20 NOTE — Social Work (Signed)
CSW discussed bed offer with family and they [facility].    CSW contacted Elease Hashimotoatricia in admission to advise and CSW sent discharge summary.  CSW will f/u.  Keene BreathPatricia [name], LCSW Clinical Social Worker [phone]

## 2017-04-21 ENCOUNTER — Telehealth: Payer: Self-pay | Admitting: Vascular Surgery

## 2017-04-21 NOTE — Telephone Encounter (Signed)
Sched appt 05/10/17 at 3:15. Ph# has no vm, mailed appt letter through regular mail.

## 2017-04-21 NOTE — Telephone Encounter (Signed)
-----   Message from Sharee PimpleMarilyn K McChesney, RN sent at 04/20/2017  8:33 AM EDT ----- Regarding: 3 weeks for staple removal   ----- Message ----- From: Chuck Hintickson, Christopher S, MD Sent: 04/20/2017   8:12 AM To: [facility] Charge Pool Subject: charge                                         This patient needs a follow-up visit in 3 weeks which would be 1 month postop for staple removal from his right BKA.  Thank you. CD

## 2017-04-27 DIAGNOSIS — A4102 Sepsis due to Methicillin resistant Staphylococcus aureus: Secondary | ICD-10-CM | POA: Diagnosis not present

## 2017-04-27 DIAGNOSIS — J441 Chronic obstructive pulmonary disease with (acute) exacerbation: Secondary | ICD-10-CM | POA: Diagnosis not present

## 2017-04-27 DIAGNOSIS — I10 Essential (primary) hypertension: Secondary | ICD-10-CM | POA: Diagnosis not present

## 2017-04-27 DIAGNOSIS — G890 Central pain syndrome: Secondary | ICD-10-CM | POA: Diagnosis not present

## 2017-05-10 ENCOUNTER — Encounter: Payer: Self-pay | Admitting: Vascular Surgery

## 2017-05-10 ENCOUNTER — Ambulatory Visit (INDEPENDENT_AMBULATORY_CARE_PROVIDER_SITE_OTHER): Payer: Self-pay | Admitting: Vascular Surgery

## 2017-05-10 ENCOUNTER — Other Ambulatory Visit: Payer: Self-pay

## 2017-05-10 VITALS — BP 124/74 | HR 77 | Temp 97.1°F | Resp 16 | Ht 71.0 in | Wt 164.0 lb

## 2017-05-10 DIAGNOSIS — I70223 Atherosclerosis of native arteries of extremities with rest pain, bilateral legs: Secondary | ICD-10-CM

## 2017-05-10 NOTE — Progress Notes (Signed)
Patient name: [name] [name] MRN: [id number] DOB: 10/31/1932 Sex: male  REASON FOR VISIT:   Follow-up after right below the knee amputation.  HPI:   [name] [name] is a pleasant 83 y.o. male who underwent a right below the knee amputation on 04/13/2017.  He had gangrenous wounds of the right leg and had undergone previous arteriogram which showed there was no options for revascularization of the right.  He comes in today for staple removal.  His only real complaint is some back pain.  Current Outpatient Medications  Medication Sig Dispense Refill  . CALCIUM PO Take 1 tablet by mouth daily.    Marland Kitchen. DAPTOmycin 680 mg in sodium chloride 0.9 % 100 mL Inject 680 mg into the vein daily.    Marland Kitchen. escitalopram (LEXAPRO) 10 MG tablet Take 1 tablet (10 mg total) by mouth daily. 30 tablet 5  . furosemide (LASIX) 40 MG tablet Take 2 tablets every morning (Patient taking differently: Take 80 mg by mouth daily. ) 30 tablet 5  . magnesium hydroxide (MILK OF MAGNESIA) 400 MG/5ML suspension Take 15 mLs by mouth daily as needed for mild constipation.    Marland Kitchen. oxyCODONE-acetaminophen (PERCOCET/ROXICET) 5-325 MG tablet Take 1-2 tablets by mouth every 6 (six) hours as needed for moderate pain. 3 tablet 0  . pantoprazole (PROTONIX) 40 MG tablet Take 1 tablet (40 mg total) by mouth daily. 30 tablet 0  . polyethylene glycol (MIRALAX / GLYCOLAX) packet Take 17 g by mouth 2 (two) times daily.    . potassium chloride SA (K-DUR,KLOR-CON) 20 MEQ tablet Take 2 tablets (40 mEq total) by mouth 2 (two) times daily.    Marland Kitchen. senna (SENOKOT) 8.6 MG TABS tablet Take 1 tablet (8.6 mg total) by mouth at bedtime.    . verapamil (CALAN) 80 MG tablet Take 0.5 tablets (40 mg total) by mouth 2 (two) times daily.    Marland Kitchen. warfarin (COUMADIN) 5 MG tablet TAKE ONE TABLET BY MOUTH ONCE DAILY AT 6PM. 30 tablet 5   No current facility-administered medications for this visit.     REVIEW OF SYSTEMS:  [X]  denotes positive finding, [ ]  denotes negative  finding Cardiac  Comments:  Chest pain or chest pressure:    Shortness of breath upon exertion:    Short of breath when lying flat:    Irregular heart rhythm:    Constitutional    Fever or chills:     PHYSICAL EXAM:   Vitals:   05/10/17 1541  BP: 124/74  Pulse: 77  Resp: 16  Temp: (!) 97.1 F (36.2 C)  TempSrc: Oral  SpO2: 97%  Weight: 164 lb (74.4 kg)  Height: 5\' 11"  (1.803 m)    GENERAL: The patient is a well-nourished male, in no acute distress. The vital signs are documented above. CARDIOVASCULAR: There is a regular rate and rhythm. PULMONARY: There is good air exchange bilaterally without wheezing or rales. He is right below the knee amputation site is healing nicely. He has a small wound on his left great toe and also pretibial area. He has a palpable femoral and popliteal pulse on the left. He has monophasic Doppler signals in the left foot.  DATA:   No new data.  MEDICAL ISSUES:   STATUS POST RIGHT BELOW THE KNEE AMPUTATION: His staples were removed in the office today.  His amputation site is healing nicely.  I have encouraged him to work on keeping the knee straight.  LEFT LEG WOUNDS: He has a superficial wound on  his pretibial area on the left and also the left great toe.  He has evidence of tibial artery occlusive disease on exam.  I will see him back in 3 weeks and we can follow this closely.  He is very concerned about not having an amputation on the left side.  Certainly if these wounds progressed we could consider arteriography.  His creatinine on 04/20/2017 was 1.2.  GFR is 54.  Waverly Ferrariickson, Moksh Loomer [facility] [phone]

## 2017-05-10 NOTE — Progress Notes (Signed)
POST OPERATIVE OFFICE NOTE    CC:  F/u for surgery  HPI:  This is a 83 y.o. male who is s/p right BKA    [name] [name] is a 83 y.o. male with a known history of severe peripheral vascular occlusive disease.  He initially was seen by Dr. Imogene Burnhen in our office April 2017.  He underwent arteriogram for superficial ulcerations and difficulty healing and pain in his lower extremities.  This revealed normal flow to the level of the popliteals and occlusion of all tibial vessels.  He was felt to be non-reconstructable.  He was seen again in our office by Dr. Edilia Boickson on December 2017.  He again was having some ulceration and pain and was offered right below-knee amputation but refused.  He is continued to have severe chronic pain requiring narcotics.  He presented earlier this week to outlying hospital with progressive gangrenous changes of his right foot.  He underwent a debridement of this by general surgery is at the outlying hospital.  He continued to have evidence of progressive infection and was transferred last night to [facility].    Allergies  Allergen Reactions  . Sulfa Antibiotics Other (See Comments)  . Sulfonamide Derivatives Hives  . Tramadol Itching and Other (See Comments)    Current Outpatient Medications  Medication Sig Dispense Refill  . CALCIUM PO Take 1 tablet by mouth daily.    Marland Kitchen. DAPTOmycin 680 mg in sodium chloride 0.9 % 100 mL Inject 680 mg into the vein daily.    Marland Kitchen. escitalopram (LEXAPRO) 10 MG tablet Take 1 tablet (10 mg total) by mouth daily. 30 tablet 5  . furosemide (LASIX) 40 MG tablet Take 2 tablets every morning (Patient taking differently: Take 80 mg by mouth daily. ) 30 tablet 5  . magnesium hydroxide (MILK OF MAGNESIA) 400 MG/5ML suspension Take 15 mLs by mouth daily as needed for mild constipation.    Marland Kitchen. oxyCODONE-acetaminophen (PERCOCET/ROXICET) 5-325 MG tablet Take 1-2 tablets by mouth every 6 (six) hours as needed for moderate pain. 3 tablet 0  .  pantoprazole (PROTONIX) 40 MG tablet Take 1 tablet (40 mg total) by mouth daily. 30 tablet 0  . polyethylene glycol (MIRALAX / GLYCOLAX) packet Take 17 g by mouth 2 (two) times daily.    . potassium chloride SA (K-DUR,KLOR-CON) 20 MEQ tablet Take 2 tablets (40 mEq total) by mouth 2 (two) times daily.    Marland Kitchen. senna (SENOKOT) 8.6 MG TABS tablet Take 1 tablet (8.6 mg total) by mouth at bedtime.    . verapamil (CALAN) 80 MG tablet Take 0.5 tablets (40 mg total) by mouth 2 (two) times daily.    Marland Kitchen. warfarin (COUMADIN) 5 MG tablet TAKE ONE TABLET BY MOUTH ONCE DAILY AT 6PM. 30 tablet 5   No current facility-administered medications for this visit.      ROS:  See HPI  Physical Exam:  Vitals:   05/10/17 1541  BP: 124/74  Pulse: 77  Resp: 16  Temp: (!) 97.1 F (36.2 C)  SpO2: 97%    Incision:  Well healed right BKA,  Extremities:  Left second toe nailbed beefy red, anterior shin residual blister are.  Doppler PT/peroneal monophasic with palpable femoral and popliteal pulses.   Assessment/Plan:  This is a 83 y.o. male who is s/p: Right BKA Left tibial disease.  Staples were removed today from the right BKA site, patient tolerated this well, steri strips applied.  We will schedule a return visit in 3 weeks  for left LE ABI and wound check.  Continue dry dressing to the left second toe and anterior shin area until fully healed.  He currently resides in a skilled facility.     Clinton GallantOLLINS, [facility]  PA-C [facility] [phone]  Clinic MD:  Edilia Boickson

## 2017-05-11 NOTE — Addendum Note (Signed)
Addended by: Burton ApleyPETTY, Alyn Riedinger A on: 05/11/2017 03:39 PM   Modules accepted: Orders

## 2017-05-24 ENCOUNTER — Ambulatory Visit (INDEPENDENT_AMBULATORY_CARE_PROVIDER_SITE_OTHER): Payer: Medicare Other | Admitting: Nurse Practitioner

## 2017-05-24 ENCOUNTER — Encounter: Payer: Self-pay | Admitting: Nurse Practitioner

## 2017-05-24 VITALS — BP 118/63 | HR 64 | Temp 97.7°F | Ht 71.0 in

## 2017-05-24 DIAGNOSIS — K220 Achalasia of cardia: Secondary | ICD-10-CM

## 2017-05-24 DIAGNOSIS — I70223 Atherosclerosis of native arteries of extremities with rest pain, bilateral legs: Secondary | ICD-10-CM | POA: Diagnosis not present

## 2017-05-24 NOTE — Progress Notes (Signed)
Referring Provider: Merlyn Albert, MD Primary Care Physician:  Merlyn Albert, MD Primary GI:  Dr. Jena Gauss  Chief Complaint  Patient presents with  . Dysphagia    trouble with foods/liquids    HPI:   [name] [name] is a 83 y.o. male who presents for follow-up on dysphagia.  The patient was last seen in our office 02/16/2017 also for dysphasia and achalasia.  Historically, barium pill esophagram showed esophageal dysmotility as well as a relative narrowing at the end of the esophagus with transient sticking of the barium pill. He did eventually proceed with upper endoscopy.  EGD completed 10/01/2015 which found mildly severe esophagitis likely pill-induced, narrowed GE junction without fixed lesion status post Maloney dilation. Normal stomach and duodenum. Recommend continue current medications. Scheduled manometry in [facility] in the near future. Evaluation by [facility] indicated likely achalasia. Recommended attempt at pneumatic dilation with a 30 mm balloon to see what this is help as well as a timed barium esophagram to measure esophageal emptying. Other treatment options including Botox injections, Heller myotomy. It appears they were unable to reach the patient to schedule further evaluation.  At his last visit he indicated he did not go back to [facility] due to transportation difficulties.  Began having dysphagia again after EGD with dilation.  Difficulty with solid food and pills.  Symptoms every other day "at least."  Soft diet has not helped, occasional regurgitation.  No other GI symptoms.  Recommended follow-up in 3 months.  He was a no show for his most recent appointment.  Today he states he's still having dysphagia symptoms, typically with anything he tries to eat and drink. He has had some subjective weight loss; unable to weigh him today so difficulty in obtaining objective assessment. He recently had right BKA and is in rehab. Denies abdominal pain, N/V, hematochezia,  melena, fever, chills. He has poor circulation and his left leg is looking poor and may need to also be treated due to wounds. Has DOE associated with CHF which is about at baseline for him and noted normal O2 sats. Denies chest pain, dizziness, lightheadedness, syncope, near syncope. Denies any other upper or lower GI symptoms.  He is on coumadin for AFib.  Past Medical History:  Diagnosis Date  . Achalasia   . Alcohol abuse    stop drinking since 01/2009  . Arthritis   . Asthma   . Atrial fibrillation (HCC)    off coumadin secondary to fall and subdural  hematoma   . B12 deficiency 9/10   started injection   . BPH (benign prostatic hyperplasia)   . Carcinoma in situ of bladder 2014   Dr. Annabell Howells  . Chronic kidney disease    RECENT UTI FEW MONTHS AGO TX WITH ANTIBIOTIC  . Compression fracture   . COPD (chronic obstructive pulmonary disease) (HCC)   . Diverticula of colon    few small scattered in the sigmoid colon  . Enlarged prostate   . External hemorrhoids 09/2014  . GERD (gastroesophageal reflux disease)   . H/O hiatal hernia   . Headache(784.0)   . Hypertension   . Nutcracker esophagus 2002   on EM  . Osteoporosis   . Peripheral edema   . Reflux   . Venous stasis    chronic     Past Surgical History:  Procedure Laterality Date  . AMPUTATION Right 04/13/2017   Procedure: RIGHT AMPUTATION BELOW KNEE;  Surgeon: Chuck Hint, MD;  Location: [facility]  OR;  Service: Vascular;  Laterality: Right;  . BLADDER ASPIRATION     PROC FOR CANCER CELLS   . CATARACT EXTRACTION W/PHACO  07/30/2012   Procedure: CATARACT EXTRACTION PHACO AND INTRAOCULAR LENS PLACEMENT (IOC);  Surgeon: Susa Simmondsarroll F Haines, MD;  Location: [facility] ORS;  Service: Ophthalmology;  Laterality: Left;  CDE:31.66  . CATARACT EXTRACTION W/PHACO Right 11/12/2012   Procedure: CATARACT EXTRACTION PHACO AND INTRAOCULAR LENS PLACEMENT (IOC);  Surgeon: Susa Simmondsarroll F Haines, MD;  Location: [facility] ORS;  Service: Ophthalmology;   Laterality: Right;  CDE 18.25  . CYSTOSCOPY  10/18/2011   Procedure: CYSTOSCOPY;  Surgeon: Anner CreteJohn J Wrenn, MD;  Location: [facility] ORS;  Service: Urology;  Laterality: N/A;  . ESOPHAGOGASTRODUODENOSCOPY  02/11/2010   RMR: GE narrowing s/p dilation 56 & 58 Fr maloney otherwise normal  . ESOPHAGOGASTRODUODENOSCOPY N/A 10/01/2015   Dr.Rourk- mildly severe esophagitis, likely pill-induced, narrowed GE junction without fixed lesion found ?query achalasia, s/p maloney dilation, nomal stomach, normal second portion of the duodenum  . HERNIA REPAIR  12 YRS AGO   RIGHT SIDE   . JOINT REPLACEMENT     RT HIP X2  (1 REPLACEMENT)  . LUNG BIOPSY  10 YRS AGO   BENIGN  . MALONEY DILATION N/A 10/01/2015   Procedure: Elease HashimotoMALONEY DILATION;  Surgeon: Corbin Adeobert M Rourk, MD;  Location: [facility] ENDO SUITE;  Service: Endoscopy;  Laterality: N/A;  . PERIPHERAL VASCULAR CATHETERIZATION N/A 10/23/2015   Procedure: Abdominal Aortogram w/Lower Extremity;  Surgeon: Sherren Kernsharles E Fields, MD;  Location: [facility] INVASIVE CV LAB;  Service: Cardiovascular;  Laterality: N/A;  . RHINOPLASTY  1974    Current Outpatient Medications  Medication Sig Dispense Refill  . CALCIUM PO Take 1 tablet by mouth daily.    . cholecalciferol (VITAMIN D) 1000 units tablet Take 1,000 Units by mouth daily.    Marland Kitchen. escitalopram (LEXAPRO) 10 MG tablet Take 1 tablet (10 mg total) by mouth daily. 30 tablet 5  . ferrous sulfate 325 (65 FE) MG tablet Take 325 mg by mouth daily with breakfast.    . folic acid (FOLVITE) 1 MG tablet Take 1 mg by mouth daily.    . furosemide (LASIX) 40 MG tablet Take 2 tablets every morning (Patient taking differently: Take 40 mg by mouth daily. ) 30 tablet 5  . Melatonin 5 MG TABS Take 1 tablet by mouth daily.    . Multiple Vitamin (MULTIVITAMIN) tablet Take 1 tablet by mouth daily.    . polyethylene glycol (MIRALAX / GLYCOLAX) packet Take 17 g by mouth 2 (two) times daily.    . potassium chloride SA (K-DUR,KLOR-CON) 20 MEQ tablet Take 2 tablets (40 mEq  total) by mouth 2 (two) times daily. (Patient taking differently: Take 10 mEq by mouth daily. )    . senna (SENOKOT) 8.6 MG TABS tablet Take 1 tablet (8.6 mg total) by mouth at bedtime.    . verapamil (CALAN) 80 MG tablet Take 0.5 tablets (40 mg total) by mouth 2 (two) times daily. (Patient taking differently: Take 120 mg by mouth daily. )    . vitamin B-12 (CYANOCOBALAMIN) 1000 MCG tablet Take 1,000 mcg by mouth daily.    . vitamin C (ASCORBIC ACID) 500 MG tablet Take 500 mg by mouth daily.    Marland Kitchen. warfarin (COUMADIN) 5 MG tablet TAKE ONE TABLET BY MOUTH ONCE DAILY AT 6PM. 30 tablet 5   No current facility-administered medications for this visit.     Allergies as of 05/24/2017 - Review Complete 05/24/2017  Allergen Reaction  Noted  . Sulfa antibiotics Other (See Comments) 12/10/2015  . Sulfonamide derivatives Hives   . Tramadol Itching and Other (See Comments) 12/10/2015    Family History  Problem Relation Age of Onset  . Cancer Brother   . Hypertension Mother   . Diabetes Mother   . Heart attack Mother   . Heart disease Mother   . Colon cancer Neg Hx   . Gastric cancer Neg Hx   . Esophageal cancer Neg Hx     Social History   Socioeconomic History  . Marital status: Married    Spouse name: None  . Number of children: 2  . Years of education: None  . Highest education level: None  Social Needs  . Financial resource strain: None  . Food insecurity - worry: None  . Food insecurity - inability: None  . Transportation needs - medical: None  . Transportation needs - non-medical: None  Occupational History  . None  Tobacco Use  . Smoking status: Never Smoker  . Smokeless tobacco: Former NeurosurgeonUser    Types: Chew  Substance and Sexual Activity  . Alcohol use: No    Alcohol/week: 0.0 oz    Comment: history of alcohol abuse stop drinking 8/10 . Alcohol abuse for about 25 years   . Drug use: No  . Sexual activity: None  Other Topics Concern  . None  Social History Narrative  .  None    Review of Systems: General: Negative for anorexia, fever, chills. Admits fatigue/weakness. Eyes: Negative for vision changes.  ENT: Negative for hoarseness. CV: Negative for chest pain, angina, palpitations, peripheral edema.  Respiratory: Negative for dyspnea at rest, cough, sputum, wheezing.  GI: See history of present illness. MS: Difficulty ambulating due to recent right BKA and venal insufficiency in his LLE.Marland Kitchen.  Derm: Admits skin issues in his left leg.  Endo: Negative for unusual weight change.  Heme: Negative for bruising or bleeding. Allergy: Negative for rash or hives.   Physical Exam: BP 118/63   Pulse 64   Temp 97.7 F (36.5 C) (Oral)   Ht 5\' 11"  (1.803 m)   BMI 22.87 kg/m  General:   Alert and oriented. Pleasant and cooperative. Well-nourished and well-developed.  Eyes:  Without icterus, sclera clear and conjunctiva pink.  Ears:  Normal auditory acuity. Cardiovascular:  S1, S2 present without murmurs appreciated. Extremities without clubbing or edema. Respiratory:  Clear to auscultation bilaterally. No wheezes, rales, or rhonchi. No distress.  Gastrointestinal:  +BS, soft, non-tender and non-distended. No HSM noted. No guarding or rebound. No masses appreciated.  Rectal:  Deferred  Musculoskalatal:  Right BKA noted, dressing CDI. Skin:  LLE with red/ruddy skin and peeling skin. Neurologic:  Alert and oriented x4;  grossly normal neurologically. Heme/Lymph/Immune: No excessive bruising noted.    05/24/2017 10:52 AM   Disclaimer: This note was dictated with voice recognition software. Similar sounding words can inadvertently be transcribed and may not be corrected upon review.

## 2017-05-24 NOTE — Patient Instructions (Signed)
1. I will check with Dr. work for his final decision on if we can provide local treatment. 2. If we cannot provide local treatment we [facility]. 3. We will notify you via telephone. 4. Call if you have any questions or concerns.

## 2017-05-24 NOTE — Assessment & Plan Note (Addendum)
Patient has achalasia which is getting worse.  [facility] offered several treatment options including Botox injections.  He is unable to follow[facility] at [facility] due to transportation issues and the distance of the drive.  He is requesting any possible local treatments.  I will follow-up with Dr. Carolan Clines'Rourke for final decision on possible Botox.  He is medically complex with congestive heart failure, recent right BKA, venous insufficiency in the left lower extremity with wounds and possible need for future BKA on the left.  This could make his endoscopic procedure risky.  I will defer final decision to Dr. work.  I am hesitant to start pharmacologic therapy with Cardizem and/or sublingual nitroglycerin due to his cardiac history.  He is on Coumadin and this would likely need to be held prior to his procedure pending cardiology input.  If we are unable to complete Botox injections locally we can refer back to [facility].  We will notify the patient/his son [name] [name] [phone]) with patient's permission of final plan.  Return for follow-up pending decision.  A total of minimum 25 minutes was spent with the patient at least 50% of which was spent on care coordination, education.

## 2017-05-24 NOTE — Progress Notes (Signed)
cc'ed to pcp °

## 2017-05-31 ENCOUNTER — Encounter (INDEPENDENT_AMBULATORY_CARE_PROVIDER_SITE_OTHER): Payer: Self-pay | Admitting: Internal Medicine

## 2017-05-31 ENCOUNTER — Ambulatory Visit: Payer: Medicare Other | Admitting: Nurse Practitioner

## 2017-06-07 ENCOUNTER — Telehealth: Payer: Self-pay | Admitting: Internal Medicine

## 2017-06-07 NOTE — Telephone Encounter (Signed)
A nurse from [facility] called on behalf of patient's son [name] [name]([name] [name]). The family is wanting to know what RMR's plan of care for the patient will be. I told her that I would have to send a note to his nurse. [name] CheshireBilly is the POA and can be reached at [phone] or the other son, [name] [name] can be reached at [phone]

## 2017-06-07 NOTE — Telephone Encounter (Signed)
Pts son would like to know the final plan for his dad.

## 2017-06-08 NOTE — Telephone Encounter (Signed)
Attempted to call both sons. POA (Billy) with multiple rings and no answer. Alternate son Julian Reil([name]) number cannot be completed as dialed despite multiple attempts with area code and with/without 1+area code.

## 2017-06-08 NOTE — Telephone Encounter (Signed)
Made second attempt to contact both brothers. Will continue to try.  CC to CM as FYI

## 2017-06-09 ENCOUNTER — Other Ambulatory Visit: Payer: Self-pay | Admitting: *Deleted

## 2017-06-09 NOTE — Patient Outreach (Signed)
[facility] Care Management  06/09/2017  Sharlyne CaiFred M Hardacre 04/18/1933 [id number]   RN Health Coach attempted#1 follow up outreach call to patient.  Patient was unavailable. No  voicemail message pick up, phone line busy.  Plan: RN will call patient again within 14 days.  Gean MaidensFrances Abbigael Detlefsen BSN RN [facility] [phone]

## 2017-06-12 ENCOUNTER — Telehealth: Payer: Self-pay | Admitting: Internal Medicine

## 2017-06-12 ENCOUNTER — Encounter: Payer: Self-pay | Admitting: Internal Medicine

## 2017-06-12 NOTE — Telephone Encounter (Signed)
[phone] please call patient nurse, son had questions about his plan of care. Minerva Areolaric was supposed to get with rmr about it and let them know.

## 2017-06-12 NOTE — Telephone Encounter (Signed)
Spoke with pts nurse. Nurse is aware that pts sons haven't answered phone calls from [facility] when called about fathers care. Routing message.

## 2017-06-13 ENCOUNTER — Ambulatory Visit (HOSPITAL_COMMUNITY)
Admission: RE | Admit: 2017-06-13 | Discharge: 2017-06-13 | Disposition: A | Discharge status: Home / Self Care | Payer: No Typology Code available for payment source | Source: Ambulatory Visit | Attending: Vascular Surgery | Admitting: Vascular Surgery

## 2017-06-13 DIAGNOSIS — I10 Essential (primary) hypertension: Secondary | ICD-10-CM | POA: Diagnosis not present

## 2017-06-13 DIAGNOSIS — R0989 Other specified symptoms and signs involving the circulatory and respiratory systems: Secondary | ICD-10-CM | POA: Diagnosis not present

## 2017-06-13 DIAGNOSIS — R9389 Abnormal findings on diagnostic imaging of other specified body structures: Secondary | ICD-10-CM | POA: Diagnosis not present

## 2017-06-13 DIAGNOSIS — I70223 Atherosclerosis of native arteries of extremities with rest pain, bilateral legs: Secondary | ICD-10-CM | POA: Insufficient documentation

## 2017-06-13 NOTE — Telephone Encounter (Signed)
Do we have the phone number for where he lives/who his nurse is?

## 2017-06-13 NOTE — Telephone Encounter (Signed)
[phone], [facility]. When called, they couldn't give me the direct name, Steward DroneBrenda said just ask [facility].

## 2017-06-14 ENCOUNTER — Ambulatory Visit (INDEPENDENT_AMBULATORY_CARE_PROVIDER_SITE_OTHER): Payer: Medicare Other | Admitting: Vascular Surgery

## 2017-06-14 ENCOUNTER — Encounter: Payer: Self-pay | Admitting: Vascular Surgery

## 2017-06-14 ENCOUNTER — Other Ambulatory Visit: Payer: Self-pay

## 2017-06-14 VITALS — BP 133/65 | HR 68 | Temp 96.8°F | Resp 16 | Ht 71.0 in | Wt 158.0 lb

## 2017-06-14 DIAGNOSIS — I739 Peripheral vascular disease, unspecified: Secondary | ICD-10-CM

## 2017-06-14 NOTE — Progress Notes (Signed)
Patient name: [name] LunchFred M Zarcone MRN: [id number] DOB: 03/22/1933 Sex: male  REASON FOR VISIT:   Follow-up of peripheral vascular disease.  HPI:   [name] [name] is a pleasant 83 y.o. male who underwent a right below the knee amputation on 04/13/2017.  He also had some wounds on his left leg and comes in for routine follow-up visit.  He is still at rehab but apparently has had a lot of back pain so has not been making much progress with therapy.  Fortunately, he denies significant pain in the left foot and the wounds have improved significantly.  Current Outpatient Medications  Medication Sig Dispense Refill  . CALCIUM PO Take 1 tablet by mouth daily.    . cholecalciferol (VITAMIN D) 1000 units tablet Take 1,000 Units by mouth daily.    Marland Kitchen. escitalopram (LEXAPRO) 10 MG tablet Take 1 tablet (10 mg total) by mouth daily. 30 tablet 5  . ferrous sulfate 325 (65 FE) MG tablet Take 325 mg by mouth daily with breakfast.    . folic acid (FOLVITE) 1 MG tablet Take 1 mg by mouth daily.    . furosemide (LASIX) 40 MG tablet Take 2 tablets every morning (Patient taking differently: Take 40 mg by mouth daily. ) 30 tablet 5  . Melatonin 5 MG TABS Take 1 tablet by mouth daily.    . Multiple Vitamin (MULTIVITAMIN) tablet Take 1 tablet by mouth daily.    . polyethylene glycol (MIRALAX / GLYCOLAX) packet Take 17 g by mouth 2 (two) times daily.    . potassium chloride SA (K-DUR,KLOR-CON) 20 MEQ tablet Take 2 tablets (40 mEq total) by mouth 2 (two) times daily. (Patient taking differently: Take 10 mEq by mouth daily. )    . senna (SENOKOT) 8.6 MG TABS tablet Take 1 tablet (8.6 mg total) by mouth at bedtime.    . verapamil (CALAN) 80 MG tablet Take 0.5 tablets (40 mg total) by mouth 2 (two) times daily. (Patient taking differently: Take 120 mg by mouth daily. )    . vitamin B-12 (CYANOCOBALAMIN) 1000 MCG tablet Take 1,000 mcg by mouth daily.    . vitamin C (ASCORBIC ACID) 500 MG tablet Take 500 mg by mouth daily.    Marland Kitchen.  warfarin (COUMADIN) 5 MG tablet TAKE ONE TABLET BY MOUTH ONCE DAILY AT 6PM. 30 tablet 5   No current facility-administered medications for this visit.     REVIEW OF SYSTEMS:  [X]  denotes positive finding, [ ]  denotes negative finding Cardiac  Comments:  Chest pain or chest pressure:    Shortness of breath upon exertion:    Short of breath when lying flat:    Irregular heart rhythm:    Constitutional    Fever or chills:     PHYSICAL EXAM:   Vitals:   06/14/17 1105  BP: 133/65  Pulse: 68  Resp: 16  Temp: (!) 96.8 F (36 C)  TempSrc: Oral  SpO2: 100%  Weight: 158 lb (71.7 kg)  Height: 5\' 11"  (1.803 m)    GENERAL: The patient is a well-nourished male, in no acute distress. The vital signs are documented above. CARDIOVASCULAR: There is a regular rate and rhythm. PULMONARY: There is good air exchange bilaterally without wheezing or rales. The wounds on the left second toe and left pretibial area have essentially healed. The right BKA is healed.  DATA:   ARTERIAL DOPPLER STUDY: The patient has a monophasic dorsalis pedis and posterior tibial signal on the left with an ABI  of 77%.  Toe pressure is 32 mmHg.  MEDICAL ISSUES:   PERIPHERAL VASCULAR DISEASE: Patient has evidence of infrainguinal arterial occlusive disease on the left however the wounds have improved significantly.  I will see him back in 6 months with follow-up ABIs.  He knows to call sooner if the wounds change any or he develops worsening symptoms.  I explained to him that I do not think he is currently ready to get a prosthesis given that he is having problems with physical therapy because of his back pain.  Waverly Ferrarihristopher Cloyde Oregel [facility] [phone]

## 2017-06-15 ENCOUNTER — Ambulatory Visit: Payer: Self-pay | Admitting: *Deleted

## 2017-06-15 ENCOUNTER — Telehealth: Payer: Self-pay

## 2017-06-15 NOTE — Telephone Encounter (Signed)
Pts son called and said he can be reached at [phone] to discuss pts plan of care.

## 2017-06-16 ENCOUNTER — Other Ambulatory Visit: Payer: Self-pay | Admitting: *Deleted

## 2017-06-16 NOTE — Patient Outreach (Signed)
[facility] Care Management  06/16/2017  [name] CaiFred M [name] 07/27/1932 [id number]   RN Health Coach has been unable to get in touch with patient. [facility] after right below the knee amputation. Patient was discharged from hospital to facility on [id number]. Plan: case closure refer to social worker for possible d/c needs from facility.   [name] BSN RN [facility] [phone]

## 2017-06-16 NOTE — Telephone Encounter (Signed)
I was finally able to get hold of the patient's son Julian Reil([name]). His dad will be leaving rehab shortly [facility] will soon stop paying). Still with dysphagia issues. On a chopped diet currently. He was unaware of the appointment with NUR. I explained that NUR is aware of his dad's situation and is willing to condier EGD +/- botox. Gave him the appointment details, phone number/address of NUR office. Encouraged him to call us if any problems and call them if any questions about the appointment.

## 2017-06-17 NOTE — Telephone Encounter (Signed)
Noted  

## 2017-06-19 NOTE — Telephone Encounter (Signed)
Noted  

## 2017-06-22 ENCOUNTER — Encounter (INDEPENDENT_AMBULATORY_CARE_PROVIDER_SITE_OTHER): Payer: Self-pay | Admitting: Internal Medicine

## 2017-06-22 ENCOUNTER — Other Ambulatory Visit: Payer: Self-pay | Admitting: Licensed Clinical Social Worker

## 2017-06-22 ENCOUNTER — Ambulatory Visit (INDEPENDENT_AMBULATORY_CARE_PROVIDER_SITE_OTHER): Payer: Medicare Other | Admitting: Internal Medicine

## 2017-06-22 ENCOUNTER — Encounter: Payer: Self-pay | Admitting: Licensed Clinical Social Worker

## 2017-06-22 VITALS — BP 130/70 | HR 58 | Temp 97.8°F

## 2017-06-22 DIAGNOSIS — I70223 Atherosclerosis of native arteries of extremities with rest pain, bilateral legs: Secondary | ICD-10-CM | POA: Diagnosis not present

## 2017-06-22 DIAGNOSIS — K220 Achalasia of cardia: Secondary | ICD-10-CM | POA: Diagnosis not present

## 2017-06-22 MED ORDER — DILTIAZEM HCL 60 MG PO TABS: 60.0000 mg | ORAL_TABLET | Freq: Three times a day (TID) | ORAL | 5 refills | Status: DC

## 2017-06-22 NOTE — Patient Outreach (Signed)
Assessment:  CSW received referral on [name] [name]. CSW completed chart review on client on 06/22/17. CSW spoke via phone with client. CSW received verbal permission from client for CSW to speak with client or [name] [name], son of client, regarding client needs. CSW spoke with [name] [name], son of client, to discuss further the current status and needs of client. [name] informed CSW that client was still residing at [facility]. [name] said that client had Medicare coverage. [name] said that he thought client would soon discharge from nursing center and would return home with needed supports in place. [name] said he thought that client may discharge home sometime next week; however, [name] was not sure, at present, of exact date of discharge for client.  [name] said client had swallowing challenges and had a reduced appetite. [name] said client has an appointment later today with medical provider related to swallowing challenges of client.  CSW described [facility] and [facility] support in areas of nursing, social work and pharmacy. CSW and [name] completed needed [facility] assessments for client. [name] said that he and his brother, [name] [name], helped in caring for needs of client.  CS[facility] with [name] about client care plan. CSW encouraged that client or [name] communicate with CSW in next 30 days to discuss community resources of assistance for client.  CSW and [name] [name] spoke of client surgery on right leg. Client had a right leg below the knee amputation a few weeks ago. [name] said client is doing well since that surgery with no pain issues related to that surgery or surgery site.  CSW [name] about client planning to discharge from facility to return home. CSW encouraged that client or [name] [name] communicate with discharge planner at nursing facility to finalize discharge plans for client.  CSW thanked [name] [name] for phone conversation with CSW on 06/22/17.    Plan:  Client or [name] [name] to communicate with CSW in next 30  days to discuss community resources of assistance for client.  CSW to call client or [name] [name] in 2 weeks to assess needs of client at that time.  [name] PillarMichael RobertsJohn [name] MSW, LCSW Licensed Clinical Social Worker [facility] Care Management [phone]

## 2017-06-22 NOTE — Progress Notes (Signed)
Reason for consultation.;  Achalasia.  Patient referred for Botox therapy.  History of present illness:  Patient is 83 year old Caucasian male who is referred through the courtesy of Dr. Jena Gaussourk for consideration of Botox therapy for achalasia.  Patient is accompanied by his 2 sons Germain OsgoodBrit and [place] today.  Patient is wheelchair-bound and presently undergoing [facility] [place] right below-knee amputation for gangrenous wounds in his right leg secondary to peripheral vascular disease.  Surgery was performed on 04/13/2017. History is obtained from the patient and his chart with help from his children.  Patient states he has had dysphagia for past several years and it has gradually gotten worse.  He had EGD with ED in December 2010 by Dr. Jena Gaussourk.  He had small sliding hiatal hernia otherwise unremarkable examination and esophagus was dilated to 60 [place].  He had symptomatic improvement in underwent repeat dilation in August 2011.  Most recently he was evaluated with barium study on 09/11/2015.  This study revealed laryngeal penetration and silent aspiration as well as esophageal dysmotility with relative narrowing at distal esophagus delay in passage of barium pill into the stomach.  He underwent EGD on 10/01/2015 by Dr. Jena Gaussourk.  This study revealed focal narrowing at GE junction suspicious for achalasia.  He also had distal esophagitis which was felt to be pill esophagitis(Fosamax).  Esophagus was dilated by passing 56, 58 and 60 [place] Maloney dilators. Patient [facility].  He was felt to have either type II or type III achalasia.  He was seen by Dr. Redgie GrayerMichael Fina who recommended pneumatic dilation to 30 mm.  However patient was not able to keep this appointment.  [facility]  Since Dr. Jena Gaussourk does not do Botox therapy patient was referred to our  office.  Patient remains with dysphagia.  He has difficulty with solids as well as liquids.  He has intermittent regurgitation.  He states it takes him several minutes to finish a meal.  He states he had ground meat, macaroni and cheese, and showed an milk at lunch and it took him over 45 minutes to finish his meal.  He has been gradually losing weight.  He states last year he weighed 217 pounds and his weight was down to 158 pounds last week.  He also has chronic cough.  He has not been recently treated for bronchitis or pneumonia.  He has chronic low back pain secondary to remote injury.  He is on hydrocodone on as-needed basis.  Previously he was on oxycodone or OxyContin but he developed constipation.  He is taking medications for constipation.  He denies melena or rectal bleeding. Prior to having below right knee amputation he was living at home.  His wife is in poor health.  She has dementia.  His family members have been providing them with help at home on daily basis. Patient has been on warfarin for chronic atrial fibrillation.   Current Medications: Outpatient Encounter Medications as of 06/22/2017  Medication Sig  . CALCIUM PO Take 1 tablet by mouth daily.  . cholecalciferol (VITAMIN D) 1000 units tablet Take 1,000 Units by mouth daily.  . diphenhydrAMINE (BENADRYL) 25 mg capsule Take 25 mg by mouth 2 (two) times daily as needed for itching.  . escitalopram (LEXAPRO) 10 MG tablet Take 1 tablet (10 mg total) by mouth daily.  . ferrous sulfate 325 (65 FE) MG tablet Take 325 mg  by mouth daily with breakfast.  . folic acid (FOLVITE) 1 MG tablet Take 1 mg by mouth daily.  . furosemide (LASIX) 40 MG tablet Take 2 tablets every morning (Patient taking differently: Take 40 mg by mouth daily. )  . Melatonin 5 MG TABS Take 1 tablet by mouth daily.  . Multiple Vitamin (MULTIVITAMIN) tablet Take 1 tablet by mouth daily.  . polyethylene glycol (MIRALAX / GLYCOLAX) packet Take 17 g by mouth 2 (two)  times daily.  . potassium chloride SA (K-DUR,KLOR-CON) 20 MEQ tablet Take 2 tablets (40 mEq total) by mouth 2 (two) times daily. (Patient taking differently: Take 10 mEq by mouth daily. )  . senna (SENOKOT) 8.6 MG TABS tablet Take 1 tablet (8.6 mg total) by mouth at bedtime.  . verapamil (CALAN) 80 MG tablet Take 0.5 tablets (40 mg total) by mouth 2 (two) times daily. (Patient taking differently: Take 120 mg by mouth daily. )  . vitamin B-12 (CYANOCOBALAMIN) 1000 MCG tablet Take 1,000 mcg by mouth daily.  . vitamin C (ASCORBIC ACID) 500 MG tablet Take 500 mg by mouth daily.  Marland Kitchen warfarin (COUMADIN) 5 MG tablet TAKE ONE TABLET BY MOUTH ONCE DAILY AT 6PM.   No facility-administered encounter medications on file as of 06/22/2017.    Past Medical History:  Diagnosis Date  . Achalasia   . Alcohol abuse    stop drinking since 01/2009  . Arthritis   . Asthma   . Atrial fibrillation (HCC)    off coumadin secondary to fall and subdural  hematoma   . B12 deficiency 9/10   started injection   . BPH (benign prostatic hyperplasia)   . Carcinoma in situ of bladder 2014   Dr. Annabell Howells  . Chronic kidney disease    RECENT UTI FEW MONTHS AGO TX WITH ANTIBIOTIC  . Compression fracture   . COPD (chronic obstructive pulmonary disease) (HCC)   . Diverticula of colon    few small scattered in the sigmoid colon  . Enlarged prostate   . External hemorrhoids 09/2014  . GERD (gastroesophageal reflux disease)   . H/O hiatal hernia   . Headache(784.0)   . Hypertension   . Nutcracker esophagus 2002   on EM  . Osteoporosis   . Peripheral edema   . Reflux   . Venous stasis    chronic    Past Surgical History:  Procedure Laterality Date  . AMPUTATION Right 04/13/2017   Procedure: RIGHT AMPUTATION BELOW KNEE;  Surgeon: Chuck Hint, MD;  [facility];  Service: Vascular;  Laterality: Right;  . BLADDER ASPIRATION     PROC FOR CANCER CELLS   . CATARACT EXTRACTION W/PHACO  07/30/2012   Procedure:  CATARACT EXTRACTION PHACO AND INTRAOCULAR LENS PLACEMENT (IOC);  Surgeon: Susa Simmonds, MD;  Location: [facility] ORS;  Service: Ophthalmology;  Laterality: Left;  CDE:31.66  . CATARACT EXTRACTION W/PHACO Right 11/12/2012   Procedure: CATARACT EXTRACTION PHACO AND INTRAOCULAR LENS PLACEMENT (IOC);  Surgeon: Susa Simmonds, MD;  Location: [facility] ORS;  Service: Ophthalmology;  Laterality: Right;  CDE 18.25  . CYSTOSCOPY  10/18/2011   Procedure: CYSTOSCOPY;  Surgeon: Anner Crete, MD;  Location: [facility] ORS;  Service: Urology;  Laterality: N/A;  . ESOPHAGOGASTRODUODENOSCOPY  02/11/2010   RMR: GE narrowing s/p dilation 56 & 58 Fr maloney otherwise normal  . ESOPHAGOGASTRODUODENOSCOPY N/A 10/01/2015   Dr.Rourk- mildly severe esophagitis, likely pill-induced, narrowed GE junction without fixed lesion found ?query achalasia, s/p maloney dilation, nomal stomach, normal second  portion of the duodenum  . HERNIA REPAIR  12 YRS AGO   RIGHT SIDE   . JOINT REPLACEMENT     RT HIP X2  (1 REPLACEMENT)  . LUNG BIOPSY  10 YRS AGO   BENIGN  . MALONEY DILATION N/A 10/01/2015   Procedure: Elease HashimotoMALONEY DILATION;  Surgeon: Corbin Adeobert M Rourk, MD;  Location: [facility] ENDO SUITE;  Service: Endoscopy;  Laterality: N/A;  . PERIPHERAL VASCULAR CATHETERIZATION N/A 10/23/2015   Procedure: Abdominal Aortogram w/Lower Extremity;  Surgeon: Sherren Kernsharles E Fields, MD;  [facility];  Service: Cardiovascular;  Laterality: N/A;  . RHINOPLASTY  1974       Physical examination.: Blood pressure 130/70, pulse (!) 58, temperature 97.8 F (36.6 C), temperature source Oral. Patient is alert and appears to be in some distress from back pain. He is in wheelchair. Conjunctiva is pink. Sclera is nonicteric Oropharyngeal mucosa is normal. No neck masses or thyromegaly noted. Cardiac exam with irregular rhythm normal S1 and S2. No murmur or gallop noted. Lungs are clear to auscultation. Abdomen was examined in sitting position.  Abdomen is soft.  Mild  tenderness noted in hypogastric region but no organomegaly or masses. Patient has right below knee amputation.  There is redness to skin at stump but no open areas noted.  Trace edema noted to left leg with stasis dermatitis.  Small area covered with dressing over left shin(healing superficial ulcer).  Labs/studies Results: CBC from 04/20/2017 WBC 11.0.  H&H 8.9 and 28.8 and platelet count 279K.  Assessment:  Patient is 83 year old Caucasian male with multiple medical problems and poor health who is recovering from below right knee amputation about 9 weeks ago.  He has several year history of dysphagia responding to esophageal dilation and it appears he has a walled into achalasia.  [facility] and scheduled for pneumatic dilation but he did not keep his appointment.  Patient is high risk for laparoscopic or endoscopic therapy.  Botox therapy would be reasonable and will provide relief for 3-6 months after each dose.  Patient is on verapamil for hypertension and atrial fibrillation.  He could be switched to Cardizem which might help improve his dysphagia.  It be worth trying before considering Botox therapy.  I talk with Dr. Brett CanalesSteve looking patient's primary care physician as well as Dr. Olena LeatherwoodHasanaj who is looking after patient while he is in a nursing home.  Both physicians have no objection and switching him to Cardizem.   Recommendations:  Discontinue verapamil. Begin Cardizem 60 mg by mouth 30 minutes before each meal. Heart rate and blood pressure will be followed closely while he is at nursing facility. Patient and/or his family member will call with progress report in 1 week. If dysphagia improved with Cardizem with hold off further therapy otherwise proceed with EGD with Botox under monitored anesthesia care. Patient will need to be off warfarin for 5 days. Office visit in 2 months.

## 2017-06-22 NOTE — Patient Instructions (Signed)
Please call office with progress report in 1 week.

## 2017-06-28 ENCOUNTER — Other Ambulatory Visit (HOSPITAL_COMMUNITY): Payer: Self-pay | Admitting: Nurse Practitioner

## 2017-06-28 DIAGNOSIS — G8929 Other chronic pain: Secondary | ICD-10-CM | POA: Diagnosis not present

## 2017-06-28 DIAGNOSIS — S22080D Wedge compression fracture of T11-T12 vertebra, subsequent encounter for fracture with routine healing: Secondary | ICD-10-CM | POA: Diagnosis not present

## 2017-06-28 DIAGNOSIS — M546 Pain in thoracic spine: Principal | ICD-10-CM

## 2017-06-28 DIAGNOSIS — M47816 Spondylosis without myelopathy or radiculopathy, lumbar region: Secondary | ICD-10-CM | POA: Diagnosis not present

## 2017-06-28 DIAGNOSIS — M5136 Other intervertebral disc degeneration, lumbar region: Secondary | ICD-10-CM | POA: Diagnosis not present

## 2017-06-28 DIAGNOSIS — S22068D Other fracture of T7-T8 thoracic vertebra, subsequent encounter for fracture with routine healing: Secondary | ICD-10-CM | POA: Diagnosis not present

## 2017-06-28 DIAGNOSIS — S22058D Other fracture of T5-T6 vertebra, subsequent encounter for fracture with routine healing: Secondary | ICD-10-CM | POA: Diagnosis not present

## 2017-06-28 DIAGNOSIS — S32058D Other fracture of fifth lumbar vertebra, subsequent encounter for fracture with routine healing: Secondary | ICD-10-CM | POA: Diagnosis not present

## 2017-06-28 DIAGNOSIS — M4317 Spondylolisthesis, lumbosacral region: Secondary | ICD-10-CM | POA: Diagnosis not present

## 2017-06-28 DIAGNOSIS — Z8614 Personal history of Methicillin resistant Staphylococcus aureus infection: Secondary | ICD-10-CM | POA: Diagnosis not present

## 2017-06-28 DIAGNOSIS — M545 Low back pain: Secondary | ICD-10-CM

## 2017-06-28 DIAGNOSIS — S32050D Wedge compression fracture of fifth lumbar vertebra, subsequent encounter for fracture with routine healing: Secondary | ICD-10-CM | POA: Diagnosis not present

## 2017-06-28 DIAGNOSIS — S32010D Wedge compression fracture of first lumbar vertebra, subsequent encounter for fracture with routine healing: Secondary | ICD-10-CM | POA: Diagnosis not present

## 2017-06-28 DIAGNOSIS — M5134 Other intervertebral disc degeneration, thoracic region: Secondary | ICD-10-CM | POA: Diagnosis not present

## 2017-06-28 DIAGNOSIS — S32018D Other fracture of first lumbar vertebra, subsequent encounter for fracture with routine healing: Secondary | ICD-10-CM | POA: Diagnosis not present

## 2017-06-28 DIAGNOSIS — S22088D Other fracture of T11-T12 vertebra, subsequent encounter for fracture with routine healing: Secondary | ICD-10-CM | POA: Diagnosis not present

## 2017-06-29 ENCOUNTER — Ambulatory Visit: Payer: Medicare Other | Admitting: Nurse Practitioner

## 2017-06-29 ENCOUNTER — Ambulatory Visit: Payer: Medicare Other | Admitting: Family Medicine

## 2017-06-29 NOTE — Addendum Note (Signed)
Addended by: Burton ApleyPETTY, Therma Lasure A on: 06/29/2017 03:39 PM   Modules accepted: Orders

## 2017-06-30 DIAGNOSIS — Z7901 Long term (current) use of anticoagulants: Secondary | ICD-10-CM | POA: Diagnosis not present

## 2017-06-30 DIAGNOSIS — Z5181 Encounter for therapeutic drug level monitoring: Secondary | ICD-10-CM | POA: Diagnosis not present

## 2017-07-03 DIAGNOSIS — Z5181 Encounter for therapeutic drug level monitoring: Secondary | ICD-10-CM | POA: Diagnosis not present

## 2017-07-03 DIAGNOSIS — Z7901 Long term (current) use of anticoagulants: Secondary | ICD-10-CM | POA: Diagnosis not present

## 2017-07-04 ENCOUNTER — Encounter: Payer: Self-pay | Admitting: Internal Medicine

## 2017-07-04 DIAGNOSIS — R05 Cough: Secondary | ICD-10-CM | POA: Diagnosis not present

## 2017-07-04 DIAGNOSIS — R0989 Other specified symptoms and signs involving the circulatory and respiratory systems: Secondary | ICD-10-CM | POA: Diagnosis not present

## 2017-07-04 DIAGNOSIS — J811 Chronic pulmonary edema: Secondary | ICD-10-CM | POA: Diagnosis not present

## 2017-07-06 ENCOUNTER — Ambulatory Visit: Payer: Medicare Other | Admitting: Family Medicine

## 2017-07-07 ENCOUNTER — Other Ambulatory Visit: Payer: Self-pay | Admitting: Licensed Clinical Social Worker

## 2017-07-07 DIAGNOSIS — Z5181 Encounter for therapeutic drug level monitoring: Secondary | ICD-10-CM | POA: Diagnosis not present

## 2017-07-07 DIAGNOSIS — Z7901 Long term (current) use of anticoagulants: Secondary | ICD-10-CM | POA: Diagnosis not present

## 2017-07-07 NOTE — Patient Outreach (Signed)
Assessment:  CSW spoke via phone with Ward ChattersBart Hearst, son of client. CSW verified identity of [name] Wiesen.  Client , Sharlyne CaiFred M. Pranger, has given CSW verbal permission for CSW to speak with [name] Schuur regarding client needs and status.  Client has been residing at [facility]  [place] to [place] needed care.  [name] said that client was not receiving physical therapy at present. [name] said client has difficulty sitting up and has back pain issues. [name] said that [place] to have a medical test related to client's back. Client is not currently able to discharge home.  [name] said that McGraw-HillBillie Clack, his mother, has medical issues of her own and at present, it would be hard for client to receive needed care at home. CSW encouraged [name] to communicate with social worker or discharge planner at nursing home to discuss discharge options for client.  Client sees Dr. Vilinda BlanksWilliam S. Luking as primary care doctor.  Client has Medicare coverage.[name] had previously informed CSW that client had swallowing challenges and had a reduced appetite. [name] said client eats small meals and that it takes extra time for client to eat meals due to swallowing challenges of client.  CSW has described to [facility] program and [facility] program support in areas of nursing, social work and pharmacy.  [name] said that he and his mother, Rushie GoltzBillie Fandino, help care for the needs of client. CSW spoke with [place]art about client care plan. CSW encouraged that client or [name] Waheed communicate with CSW in next 30 days to discuss community resources of assistance for client. [name] said that client has had a right below the knee amputation in the past and was doing well with healing of surgery site. Client uses a wheelchair for ambulation at present.  [name] said client fatigues and has back pain and can only sit up in wheelchair for a short period of time and then asks to again rest in bed.  CSW gave [facility] CSW phone number of  [phone]. CSW encouraged [name] to call CSW as needed to discuss social work needs of client.  CSW thanked [place]art for phone call with CSW on 07/07/17.   Plan:  Client or [name] Roscoe to communicate with CSW in next 30 days to discuss community resources of assistance for client.    CSW to call client or [name] Brew in 3 weeks to assess client needs at that time.  Kelton PillarMichael S.Meggin Ola MSW, LCSW Licensed Clinical Social Worker [facility] Care Management [phone]

## 2017-07-08 DIAGNOSIS — A4102 Sepsis due to Methicillin resistant Staphylococcus aureus: Secondary | ICD-10-CM | POA: Diagnosis not present

## 2017-07-08 DIAGNOSIS — I10 Essential (primary) hypertension: Secondary | ICD-10-CM | POA: Diagnosis not present

## 2017-07-08 DIAGNOSIS — G890 Central pain syndrome: Secondary | ICD-10-CM | POA: Diagnosis not present

## 2017-07-08 DIAGNOSIS — J441 Chronic obstructive pulmonary disease with (acute) exacerbation: Secondary | ICD-10-CM | POA: Diagnosis not present

## 2017-07-10 DIAGNOSIS — Z5181 Encounter for therapeutic drug level monitoring: Secondary | ICD-10-CM | POA: Diagnosis not present

## 2017-07-10 DIAGNOSIS — Z7901 Long term (current) use of anticoagulants: Secondary | ICD-10-CM | POA: Diagnosis not present

## 2017-07-11 ENCOUNTER — Ambulatory Visit (HOSPITAL_COMMUNITY)
Admission: RE | Admit: 2017-07-11 | Discharge: 2017-07-11 | Disposition: A | Discharge status: Home / Self Care | Payer: Medicare Other | Source: Ambulatory Visit | Attending: Nurse Practitioner | Admitting: Nurse Practitioner

## 2017-07-11 DIAGNOSIS — S32020A Wedge compression fracture of second lumbar vertebra, initial encounter for closed fracture: Secondary | ICD-10-CM | POA: Diagnosis not present

## 2017-07-11 DIAGNOSIS — M546 Pain in thoracic spine: Secondary | ICD-10-CM | POA: Diagnosis not present

## 2017-07-11 DIAGNOSIS — M4854XA Collapsed vertebra, not elsewhere classified, thoracic region, initial encounter for fracture: Secondary | ICD-10-CM | POA: Insufficient documentation

## 2017-07-11 DIAGNOSIS — M4856XA Collapsed vertebra, not elsewhere classified, lumbar region, initial encounter for fracture: Secondary | ICD-10-CM | POA: Diagnosis not present

## 2017-07-11 DIAGNOSIS — S2249XA Multiple fractures of ribs, unspecified side, initial encounter for closed fracture: Secondary | ICD-10-CM | POA: Diagnosis not present

## 2017-07-11 DIAGNOSIS — M545 Low back pain, unspecified: Secondary | ICD-10-CM

## 2017-07-11 DIAGNOSIS — M48061 Spinal stenosis, lumbar region without neurogenic claudication: Secondary | ICD-10-CM | POA: Diagnosis not present

## 2017-07-11 DIAGNOSIS — S32030A Wedge compression fracture of third lumbar vertebra, initial encounter for closed fracture: Secondary | ICD-10-CM | POA: Diagnosis not present

## 2017-07-11 DIAGNOSIS — G8929 Other chronic pain: Secondary | ICD-10-CM

## 2017-07-11 DIAGNOSIS — R937 Abnormal findings on diagnostic imaging of other parts of musculoskeletal system: Secondary | ICD-10-CM | POA: Insufficient documentation

## 2017-07-12 DIAGNOSIS — M4646 Discitis, unspecified, lumbar region: Secondary | ICD-10-CM | POA: Diagnosis not present

## 2017-07-13 DIAGNOSIS — Z5181 Encounter for therapeutic drug level monitoring: Secondary | ICD-10-CM | POA: Diagnosis not present

## 2017-07-13 DIAGNOSIS — Z7901 Long term (current) use of anticoagulants: Secondary | ICD-10-CM | POA: Diagnosis not present

## 2017-07-14 ENCOUNTER — Other Ambulatory Visit: Payer: Self-pay | Admitting: Licensed Clinical Social Worker

## 2017-07-14 DIAGNOSIS — Z5181 Encounter for therapeutic drug level monitoring: Secondary | ICD-10-CM | POA: Diagnosis not present

## 2017-07-14 DIAGNOSIS — Z7901 Long term (current) use of anticoagulants: Secondary | ICD-10-CM | POA: Diagnosis not present

## 2017-07-14 NOTE — Patient Outreach (Signed)
[facility]) Care Management  [facility] Social Work  07/14/2017  [name] [name] 1932-11-10 [id number]  Subjective:    Objective:   Encounter Medications:  Outpatient Encounter Medications as of 07/14/2017  Medication Sig  . CALCIUM PO Take 1 tablet by mouth daily.  . cholecalciferol (VITAMIN D) 1000 units tablet Take 1,000 Units by mouth daily.  Marland Kitchen diltiazem (CARDIZEM) 60 MG tablet Take 1 tablet (60 mg total) by mouth 3 (three) times daily.  . diphenhydrAMINE (BENADRYL) 25 mg capsule Take 25 mg by mouth 2 (two) times daily as needed for itching.  . escitalopram (LEXAPRO) 10 MG tablet Take 1 tablet (10 mg total) by mouth daily.  . ferrous sulfate 325 (65 FE) MG tablet Take 325 mg by mouth daily with breakfast.  . folic acid (FOLVITE) 1 MG tablet Take 1 mg by mouth daily.  . furosemide (LASIX) 40 MG tablet Take 2 tablets every morning (Patient taking differently: Take 40 mg by mouth daily. )  . HYDROcodone-acetaminophen (NORCO/VICODIN) 5-325 MG tablet Take 1 tablet by mouth every 6 (six) hours as needed for moderate pain.  . Melatonin 5 MG TABS Take 1 tablet by mouth daily.  . Multiple Vitamin (MULTIVITAMIN) tablet Take 1 tablet by mouth daily.  . polyethylene glycol (MIRALAX / GLYCOLAX) packet Take 17 g by mouth 2 (two) times daily.  . potassium chloride SA (K-DUR,KLOR-CON) 20 MEQ tablet Take 2 tablets (40 mEq total) by mouth 2 (two) times daily. (Patient taking differently: Take 10 mEq by mouth daily. )  . senna (SENOKOT) 8.6 MG TABS tablet Take 1 tablet (8.6 mg total) by mouth at bedtime.  . vitamin B-12 (CYANOCOBALAMIN) 1000 MCG tablet Take 1,000 mcg by mouth daily.  . vitamin C (ASCORBIC ACID) 500 MG tablet Take 500 mg by mouth daily.  Marland Kitchen warfarin (COUMADIN) 5 MG tablet TAKE ONE TABLET BY MOUTH ONCE DAILY AT 6PM.   No facility-administered encounter medications on file as of 07/14/2017.     Functional Status:  In your present state of health, do you have any difficulty  performing the following activities: 06/22/2017 12/05/2016  Hearing? N N  Vision? N N  Difficulty concentrating or making decisions? N N  Walking or climbing stairs? Y Y  Dressing or bathing? Y N  Doing errands, shopping? Y N  Preparing Food and eating ? Y N  Using the Toilet? Y N  In the past six months, have you accidently leaked urine? N Y  Do you have problems with loss of bowel control? N N  Managing your Medications? Y N  Managing your Finances? Y N  Housekeeping or managing your Housekeeping? Tempie Donning  Some recent data might be hidden    Fall/Depression Screening:  PHQ 2/9 Scores 06/22/2017 01/12/2017 12/05/2016 11/01/2016  PHQ - 2 Score 2 2 0 0  PHQ- 9 Score 5 7 - -    Assessment:   CSW traveled to [facility] in [place], [place] on 07/14/17 to visit client. CSW met with client on 07/14/17 at client's room at [facility]. [facility] also met with CSW and client at nursing center on 07/14/17 for client visit. Client said he has ongoing back pain issues. He said he is working with physical therapy to do bed exercises for mobility.  Client has support from his son, [name] [name].  Client desires to return home upon discharge from nursing center. His wife, [name] [name] resides at home of client.  [name]  [name], son of client, is main family support member for [employer]. CSW spoke with [name] about care plan for client CSW encouraged [place] to participate in all scheduled client physical therapy sessions for client in next 30 days at nursing facility. Client said he did have a wheelchair and 3 in 1 bedside commode at home. He said also that he had a hospital bed at home.  CSW gave [name] [name] CSW card and encouraged that Domanique or his son, [name] [name], please call CSW at [phone] as needed to discuss social work needs of client.  Client said his mouth often is dry . He said he often has challenges also with swallowing.  CSW thanked Nimesh for allowing CSW to visit  with him at nursing center on 07/14/17.                                            Plan:    Client to participate in all scheduled client physical therapy sessions for client in next 30 days at nursing center.  CSW to collaborate with RN [name] [name] in monitoring ongoing needs of client.  CSW to call client or [name] [name] in 2 weeks to assess client needs and status.  [name] [name].Adrianna Dudas MSW, LCSW Licensed Clinical Social Worker [facility] Care Management [phone]

## 2017-07-18 DIAGNOSIS — M4646 Discitis, unspecified, lumbar region: Secondary | ICD-10-CM | POA: Diagnosis not present

## 2017-07-20 DIAGNOSIS — R21 Rash and other nonspecific skin eruption: Secondary | ICD-10-CM | POA: Diagnosis not present

## 2017-07-20 DIAGNOSIS — M868X8 Other osteomyelitis, other site: Secondary | ICD-10-CM | POA: Diagnosis not present

## 2017-07-20 DIAGNOSIS — I130 Hypertensive heart and chronic kidney disease with heart failure and stage 1 through stage 4 chronic kidney disease, or unspecified chronic kidney disease: Secondary | ICD-10-CM | POA: Diagnosis not present

## 2017-07-20 DIAGNOSIS — M549 Dorsalgia, unspecified: Secondary | ICD-10-CM | POA: Diagnosis not present

## 2017-07-20 DIAGNOSIS — R0602 Shortness of breath: Secondary | ICD-10-CM | POA: Diagnosis not present

## 2017-07-20 DIAGNOSIS — K6812 Psoas muscle abscess: Secondary | ICD-10-CM | POA: Diagnosis not present

## 2017-07-20 DIAGNOSIS — I5022 Chronic systolic (congestive) heart failure: Secondary | ICD-10-CM | POA: Diagnosis not present

## 2017-07-20 DIAGNOSIS — M4646 Discitis, unspecified, lumbar region: Secondary | ICD-10-CM | POA: Diagnosis not present

## 2017-07-20 DIAGNOSIS — M4626 Osteomyelitis of vertebra, lumbar region: Secondary | ICD-10-CM | POA: Diagnosis not present

## 2017-07-20 DIAGNOSIS — Z89511 Acquired absence of right leg below knee: Secondary | ICD-10-CM | POA: Diagnosis not present

## 2017-07-20 DIAGNOSIS — B9562 Methicillin resistant Staphylococcus aureus infection as the cause of diseases classified elsewhere: Secondary | ICD-10-CM | POA: Diagnosis not present

## 2017-07-20 DIAGNOSIS — M4854XA Collapsed vertebra, not elsewhere classified, thoracic region, initial encounter for fracture: Secondary | ICD-10-CM | POA: Diagnosis not present

## 2017-07-21 DIAGNOSIS — I493 Ventricular premature depolarization: Secondary | ICD-10-CM | POA: Diagnosis not present

## 2017-07-21 DIAGNOSIS — M954 Acquired deformity of chest and rib: Secondary | ICD-10-CM | POA: Diagnosis not present

## 2017-07-21 DIAGNOSIS — J90 Pleural effusion, not elsewhere classified: Secondary | ICD-10-CM | POA: Diagnosis not present

## 2017-07-21 DIAGNOSIS — K5900 Constipation, unspecified: Secondary | ICD-10-CM | POA: Diagnosis not present

## 2017-07-21 DIAGNOSIS — B964 Proteus (mirabilis) (morganii) as the cause of diseases classified elsewhere: Secondary | ICD-10-CM | POA: Diagnosis not present

## 2017-07-21 DIAGNOSIS — E876 Hypokalemia: Secondary | ICD-10-CM | POA: Diagnosis not present

## 2017-07-21 DIAGNOSIS — I5020 Unspecified systolic (congestive) heart failure: Secondary | ICD-10-CM | POA: Diagnosis not present

## 2017-07-21 DIAGNOSIS — Z886 Allergy status to analgesic agent status: Secondary | ICD-10-CM | POA: Diagnosis not present

## 2017-07-21 DIAGNOSIS — I4891 Unspecified atrial fibrillation: Secondary | ICD-10-CM | POA: Diagnosis present

## 2017-07-21 DIAGNOSIS — B9562 Methicillin resistant Staphylococcus aureus infection as the cause of diseases classified elsewhere: Secondary | ICD-10-CM | POA: Diagnosis not present

## 2017-07-21 DIAGNOSIS — E538 Deficiency of other specified B group vitamins: Secondary | ICD-10-CM | POA: Diagnosis not present

## 2017-07-21 DIAGNOSIS — Z7401 Bed confinement status: Secondary | ICD-10-CM | POA: Diagnosis not present

## 2017-07-21 DIAGNOSIS — N400 Enlarged prostate without lower urinary tract symptoms: Secondary | ICD-10-CM | POA: Diagnosis present

## 2017-07-21 DIAGNOSIS — G894 Chronic pain syndrome: Secondary | ICD-10-CM | POA: Diagnosis not present

## 2017-07-21 DIAGNOSIS — M4646 Discitis, unspecified, lumbar region: Secondary | ICD-10-CM | POA: Diagnosis not present

## 2017-07-21 DIAGNOSIS — Z89519 Acquired absence of unspecified leg below knee: Secondary | ICD-10-CM | POA: Diagnosis not present

## 2017-07-21 DIAGNOSIS — G4700 Insomnia, unspecified: Secondary | ICD-10-CM | POA: Diagnosis not present

## 2017-07-21 DIAGNOSIS — M6281 Muscle weakness (generalized): Secondary | ICD-10-CM | POA: Diagnosis not present

## 2017-07-21 DIAGNOSIS — R630 Anorexia: Secondary | ICD-10-CM | POA: Diagnosis not present

## 2017-07-21 DIAGNOSIS — Z89511 Acquired absence of right leg below knee: Secondary | ICD-10-CM | POA: Diagnosis not present

## 2017-07-21 DIAGNOSIS — K219 Gastro-esophageal reflux disease without esophagitis: Secondary | ICD-10-CM | POA: Diagnosis present

## 2017-07-21 DIAGNOSIS — N189 Chronic kidney disease, unspecified: Secondary | ICD-10-CM | POA: Diagnosis present

## 2017-07-21 DIAGNOSIS — M4854XA Collapsed vertebra, not elsewhere classified, thoracic region, initial encounter for fracture: Secondary | ICD-10-CM | POA: Diagnosis present

## 2017-07-21 DIAGNOSIS — Z833 Family history of diabetes mellitus: Secondary | ICD-10-CM | POA: Diagnosis not present

## 2017-07-21 DIAGNOSIS — M868X6 Other osteomyelitis, lower leg: Secondary | ICD-10-CM | POA: Diagnosis not present

## 2017-07-21 DIAGNOSIS — D509 Iron deficiency anemia, unspecified: Secondary | ICD-10-CM | POA: Diagnosis present

## 2017-07-21 DIAGNOSIS — K220 Achalasia of cardia: Secondary | ICD-10-CM | POA: Diagnosis not present

## 2017-07-21 DIAGNOSIS — M47816 Spondylosis without myelopathy or radiculopathy, lumbar region: Secondary | ICD-10-CM | POA: Diagnosis not present

## 2017-07-21 DIAGNOSIS — Z743 Need for continuous supervision: Secondary | ICD-10-CM | POA: Diagnosis not present

## 2017-07-21 DIAGNOSIS — F329 Major depressive disorder, single episode, unspecified: Secondary | ICD-10-CM | POA: Diagnosis present

## 2017-07-21 DIAGNOSIS — Z809 Family history of malignant neoplasm, unspecified: Secondary | ICD-10-CM | POA: Diagnosis not present

## 2017-07-21 DIAGNOSIS — R638 Other symptoms and signs concerning food and fluid intake: Secondary | ICD-10-CM | POA: Diagnosis not present

## 2017-07-21 DIAGNOSIS — I480 Paroxysmal atrial fibrillation: Secondary | ICD-10-CM | POA: Diagnosis not present

## 2017-07-21 DIAGNOSIS — J449 Chronic obstructive pulmonary disease, unspecified: Secondary | ICD-10-CM | POA: Diagnosis present

## 2017-07-21 DIAGNOSIS — I739 Peripheral vascular disease, unspecified: Secondary | ICD-10-CM | POA: Diagnosis present

## 2017-07-21 DIAGNOSIS — I110 Hypertensive heart disease with heart failure: Secondary | ICD-10-CM | POA: Diagnosis not present

## 2017-07-21 DIAGNOSIS — D538 Other specified nutritional anemias: Secondary | ICD-10-CM | POA: Diagnosis not present

## 2017-07-21 DIAGNOSIS — R1310 Dysphagia, unspecified: Secondary | ICD-10-CM | POA: Diagnosis present

## 2017-07-21 DIAGNOSIS — L219 Seborrheic dermatitis, unspecified: Secondary | ICD-10-CM | POA: Diagnosis not present

## 2017-07-21 DIAGNOSIS — M810 Age-related osteoporosis without current pathological fracture: Secondary | ICD-10-CM | POA: Diagnosis present

## 2017-07-21 DIAGNOSIS — M79669 Pain in unspecified lower leg: Secondary | ICD-10-CM | POA: Diagnosis not present

## 2017-07-21 DIAGNOSIS — R279 Unspecified lack of coordination: Secondary | ICD-10-CM | POA: Diagnosis not present

## 2017-07-21 DIAGNOSIS — Z8249 Family history of ischemic heart disease and other diseases of the circulatory system: Secondary | ICD-10-CM | POA: Diagnosis not present

## 2017-07-21 DIAGNOSIS — I10 Essential (primary) hypertension: Secondary | ICD-10-CM | POA: Diagnosis not present

## 2017-07-21 DIAGNOSIS — M47814 Spondylosis without myelopathy or radiculopathy, thoracic region: Secondary | ICD-10-CM | POA: Diagnosis present

## 2017-07-21 DIAGNOSIS — M47812 Spondylosis without myelopathy or radiculopathy, cervical region: Secondary | ICD-10-CM | POA: Diagnosis present

## 2017-07-21 DIAGNOSIS — M4626 Osteomyelitis of vertebra, lumbar region: Secondary | ICD-10-CM | POA: Diagnosis present

## 2017-07-21 DIAGNOSIS — K6812 Psoas muscle abscess: Secondary | ICD-10-CM | POA: Diagnosis present

## 2017-07-21 DIAGNOSIS — I5022 Chronic systolic (congestive) heart failure: Secondary | ICD-10-CM | POA: Diagnosis present

## 2017-07-21 DIAGNOSIS — J8410 Pulmonary fibrosis, unspecified: Secondary | ICD-10-CM | POA: Diagnosis not present

## 2017-07-21 DIAGNOSIS — I4510 Unspecified right bundle-branch block: Secondary | ICD-10-CM | POA: Diagnosis not present

## 2017-07-21 DIAGNOSIS — Z7901 Long term (current) use of anticoagulants: Secondary | ICD-10-CM | POA: Diagnosis not present

## 2017-07-21 DIAGNOSIS — R2689 Other abnormalities of gait and mobility: Secondary | ICD-10-CM | POA: Diagnosis not present

## 2017-07-21 DIAGNOSIS — R0602 Shortness of breath: Secondary | ICD-10-CM | POA: Diagnosis not present

## 2017-07-21 DIAGNOSIS — I70202 Unspecified atherosclerosis of native arteries of extremities, left leg: Secondary | ICD-10-CM | POA: Diagnosis not present

## 2017-07-21 DIAGNOSIS — I130 Hypertensive heart and chronic kidney disease with heart failure and stage 1 through stage 4 chronic kidney disease, or unspecified chronic kidney disease: Secondary | ICD-10-CM | POA: Diagnosis present

## 2017-07-21 DIAGNOSIS — R918 Other nonspecific abnormal finding of lung field: Secondary | ICD-10-CM | POA: Diagnosis not present

## 2017-07-21 DIAGNOSIS — M868X8 Other osteomyelitis, other site: Secondary | ICD-10-CM | POA: Diagnosis not present

## 2017-07-21 DIAGNOSIS — Z959 Presence of cardiac and vascular implant and graft, unspecified: Secondary | ICD-10-CM | POA: Diagnosis not present

## 2017-07-21 DIAGNOSIS — D649 Anemia, unspecified: Secondary | ICD-10-CM | POA: Diagnosis not present

## 2017-07-21 DIAGNOSIS — R21 Rash and other nonspecific skin eruption: Secondary | ICD-10-CM | POA: Diagnosis not present

## 2017-07-21 DIAGNOSIS — L299 Pruritus, unspecified: Secondary | ICD-10-CM | POA: Diagnosis present

## 2017-07-21 DIAGNOSIS — M79673 Pain in unspecified foot: Secondary | ICD-10-CM | POA: Diagnosis not present

## 2017-07-21 DIAGNOSIS — A4102 Sepsis due to Methicillin resistant Staphylococcus aureus: Secondary | ICD-10-CM | POA: Diagnosis not present

## 2017-07-21 DIAGNOSIS — R1311 Dysphagia, oral phase: Secondary | ICD-10-CM | POA: Diagnosis not present

## 2017-07-21 DIAGNOSIS — M549 Dorsalgia, unspecified: Secondary | ICD-10-CM | POA: Diagnosis not present

## 2017-07-21 DIAGNOSIS — R001 Bradycardia, unspecified: Secondary | ICD-10-CM | POA: Diagnosis not present

## 2017-07-28 ENCOUNTER — Other Ambulatory Visit: Payer: Self-pay | Admitting: Licensed Clinical Social Worker

## 2017-07-28 MED ORDER — ASPIRIN EC 325 MG PO TBEC
325.00 | DELAYED_RELEASE_TABLET | ORAL | Status: DC
Start: 2017-08-08 — End: 2017-07-28

## 2017-07-28 MED ORDER — ACETAMINOPHEN 500 MG PO TABS
1000.00 | ORAL_TABLET | ORAL | Status: DC
Start: 2017-07-28 — End: 2017-07-28

## 2017-07-28 MED ORDER — MELATONIN 3 MG PO TABS
3.00 | ORAL_TABLET | ORAL | Status: DC
Start: ? — End: 2017-07-28

## 2017-07-28 MED ORDER — POLYETHYLENE GLYCOL 3350 17 G PO PACK
17.00 g | PACK | ORAL | Status: DC
Start: ? — End: 2017-07-28

## 2017-07-28 MED ORDER — GENERIC EXTERNAL MEDICATION
1.50 g | Status: DC
Start: 2017-07-28 — End: 2017-07-28

## 2017-07-28 MED ORDER — GENERIC EXTERNAL MEDICATION
500.00 | Status: DC
Start: 2017-08-07 — End: 2017-07-28

## 2017-07-28 MED ORDER — GENERIC EXTERNAL MEDICATION
5.00 | Status: DC
Start: 2017-07-29 — End: 2017-07-28

## 2017-07-28 MED ORDER — CHOLECALCIFEROL 25 MCG (1000 UT) PO TABS
1000.00 | ORAL_TABLET | ORAL | Status: DC
Start: 2017-08-08 — End: 2017-07-28

## 2017-07-28 MED ORDER — ESCITALOPRAM OXALATE 10 MG PO TABS
20.00 | ORAL_TABLET | ORAL | Status: DC
Start: 2017-08-08 — End: 2017-07-28

## 2017-07-28 MED ORDER — HYDROXYZINE PAMOATE 25 MG PO CAPS
25.00 | ORAL_CAPSULE | ORAL | Status: DC
Start: ? — End: 2017-07-28

## 2017-07-28 MED ORDER — FERROUS SULFATE 325 (65 FE) MG PO TABS
325.00 | ORAL_TABLET | ORAL | Status: DC
Start: 2017-08-08 — End: 2017-07-28

## 2017-07-28 MED ORDER — DILTIAZEM HCL 30 MG PO TABS
60.00 | ORAL_TABLET | ORAL | Status: DC
Start: 2017-07-28 — End: 2017-07-28

## 2017-07-28 NOTE — Patient Outreach (Signed)
Assessment:  CSW spoke via phone with [name] [name], son of client. CSW verified identity of [name] [name].  CSW received verbal permission from [name] [name] on 07/28/17 for CSW to speak with [name]. Client has been receiving nursing care and physical therapy support [name].  CSW had talked with client about physical therapy participation, as able, by client at nursing facilty.[name] [name] said that client is now at [name] [name] receiving care. [name] said that plan is for client  to receive care needed at [name] [name] and then hopefully client could return to [name] [name] for care.  [name] did say that at present it is difficult for client to participate in physical therapy sessions due to pain issues of client. [name] said that client currently has an infection that is being treated medically. CSW spoke with [facility] about client care plan. CSW encouraged that client, when he is able, to try to participate in scheduled client physical therapy sessions for client at nursing [name] [place].  [name] [name], son of client,  is main family support member for [name] [name]. Client has said that he has a wheelchair, 3 in 1 bedside commode and a [name] bed at home of client.  Client has said that sometimes his mouth is dry and has said that he sometimes has challenges with swallowing. [name] said he is also assisting his mother in the home environment as needed. CSW thanked [name] [name] for phone call with CSW on 07/28/17. CSW encouraged [name] to call CSW at [phone] as needed to discuss social work needs of client.     Plan:  Client to participate, when he is able, in scheduled client physical therapy sessions for client in next 30 days at [name].  CSW to call client or [name] [name] in 3 weeks to assess client need at that time.  [name] [name] MSW, LCSW Licensed Clinical Social Worker [facility] Care Management [phone]

## 2017-08-07 DIAGNOSIS — G894 Chronic pain syndrome: Secondary | ICD-10-CM | POA: Diagnosis not present

## 2017-08-07 DIAGNOSIS — R2689 Other abnormalities of gait and mobility: Secondary | ICD-10-CM | POA: Diagnosis not present

## 2017-08-07 DIAGNOSIS — Z79899 Other long term (current) drug therapy: Secondary | ICD-10-CM | POA: Diagnosis not present

## 2017-08-07 DIAGNOSIS — K220 Achalasia of cardia: Secondary | ICD-10-CM | POA: Diagnosis present

## 2017-08-07 DIAGNOSIS — M4620 Osteomyelitis of vertebra, site unspecified: Secondary | ICD-10-CM | POA: Diagnosis not present

## 2017-08-07 DIAGNOSIS — K219 Gastro-esophageal reflux disease without esophagitis: Secondary | ICD-10-CM | POA: Diagnosis not present

## 2017-08-07 DIAGNOSIS — I110 Hypertensive heart disease with heart failure: Secondary | ICD-10-CM | POA: Diagnosis present

## 2017-08-07 DIAGNOSIS — K5900 Constipation, unspecified: Secondary | ICD-10-CM | POA: Diagnosis not present

## 2017-08-07 DIAGNOSIS — J189 Pneumonia, unspecified organism: Secondary | ICD-10-CM | POA: Diagnosis present

## 2017-08-07 DIAGNOSIS — I739 Peripheral vascular disease, unspecified: Secondary | ICD-10-CM | POA: Diagnosis present

## 2017-08-07 DIAGNOSIS — A4902 Methicillin resistant Staphylococcus aureus infection, unspecified site: Secondary | ICD-10-CM | POA: Diagnosis not present

## 2017-08-07 DIAGNOSIS — G4700 Insomnia, unspecified: Secondary | ICD-10-CM | POA: Diagnosis not present

## 2017-08-07 DIAGNOSIS — J90 Pleural effusion, not elsewhere classified: Secondary | ICD-10-CM | POA: Diagnosis not present

## 2017-08-07 DIAGNOSIS — B964 Proteus (mirabilis) (morganii) as the cause of diseases classified elsewhere: Secondary | ICD-10-CM | POA: Diagnosis not present

## 2017-08-07 DIAGNOSIS — I4891 Unspecified atrial fibrillation: Secondary | ICD-10-CM | POA: Diagnosis present

## 2017-08-07 DIAGNOSIS — J449 Chronic obstructive pulmonary disease, unspecified: Secondary | ICD-10-CM | POA: Diagnosis not present

## 2017-08-07 DIAGNOSIS — Z0000 Encounter for general adult medical examination without abnormal findings: Secondary | ICD-10-CM | POA: Diagnosis not present

## 2017-08-07 DIAGNOSIS — L219 Seborrheic dermatitis, unspecified: Secondary | ICD-10-CM | POA: Diagnosis not present

## 2017-08-07 DIAGNOSIS — M6281 Muscle weakness (generalized): Secondary | ICD-10-CM | POA: Diagnosis not present

## 2017-08-07 DIAGNOSIS — R0602 Shortness of breath: Secondary | ICD-10-CM | POA: Diagnosis not present

## 2017-08-07 DIAGNOSIS — Z5181 Encounter for therapeutic drug level monitoring: Secondary | ICD-10-CM | POA: Diagnosis not present

## 2017-08-07 DIAGNOSIS — M4626 Osteomyelitis of vertebra, lumbar region: Secondary | ICD-10-CM | POA: Diagnosis present

## 2017-08-07 DIAGNOSIS — Z885 Allergy status to narcotic agent status: Secondary | ICD-10-CM | POA: Diagnosis not present

## 2017-08-07 DIAGNOSIS — R001 Bradycardia, unspecified: Secondary | ICD-10-CM | POA: Diagnosis not present

## 2017-08-07 DIAGNOSIS — Z743 Need for continuous supervision: Secondary | ICD-10-CM | POA: Diagnosis not present

## 2017-08-07 DIAGNOSIS — M438X6 Other specified deforming dorsopathies, lumbar region: Secondary | ICD-10-CM | POA: Diagnosis not present

## 2017-08-07 DIAGNOSIS — G890 Central pain syndrome: Secondary | ICD-10-CM | POA: Diagnosis not present

## 2017-08-07 DIAGNOSIS — R1311 Dysphagia, oral phase: Secondary | ICD-10-CM | POA: Diagnosis not present

## 2017-08-07 DIAGNOSIS — D509 Iron deficiency anemia, unspecified: Secondary | ICD-10-CM | POA: Diagnosis present

## 2017-08-07 DIAGNOSIS — M869 Osteomyelitis, unspecified: Secondary | ICD-10-CM | POA: Diagnosis not present

## 2017-08-07 DIAGNOSIS — J441 Chronic obstructive pulmonary disease with (acute) exacerbation: Secondary | ICD-10-CM | POA: Diagnosis not present

## 2017-08-07 DIAGNOSIS — A4102 Sepsis due to Methicillin resistant Staphylococcus aureus: Secondary | ICD-10-CM | POA: Diagnosis not present

## 2017-08-07 DIAGNOSIS — I5023 Acute on chronic systolic (congestive) heart failure: Secondary | ICD-10-CM | POA: Diagnosis present

## 2017-08-07 DIAGNOSIS — Z792 Long term (current) use of antibiotics: Secondary | ICD-10-CM | POA: Diagnosis not present

## 2017-08-07 DIAGNOSIS — E876 Hypokalemia: Secondary | ICD-10-CM | POA: Diagnosis not present

## 2017-08-07 DIAGNOSIS — A419 Sepsis, unspecified organism: Secondary | ICD-10-CM | POA: Diagnosis present

## 2017-08-07 DIAGNOSIS — I509 Heart failure, unspecified: Secondary | ICD-10-CM | POA: Diagnosis not present

## 2017-08-07 DIAGNOSIS — Z7901 Long term (current) use of anticoagulants: Secondary | ICD-10-CM | POA: Diagnosis not present

## 2017-08-07 DIAGNOSIS — M79669 Pain in unspecified lower leg: Secondary | ICD-10-CM | POA: Diagnosis not present

## 2017-08-07 DIAGNOSIS — D61818 Other pancytopenia: Secondary | ICD-10-CM | POA: Diagnosis present

## 2017-08-07 DIAGNOSIS — M4316 Spondylolisthesis, lumbar region: Secondary | ICD-10-CM | POA: Diagnosis not present

## 2017-08-07 DIAGNOSIS — Z882 Allergy status to sulfonamides status: Secondary | ICD-10-CM | POA: Diagnosis not present

## 2017-08-07 DIAGNOSIS — R7989 Other specified abnormal findings of blood chemistry: Secondary | ICD-10-CM | POA: Diagnosis not present

## 2017-08-07 DIAGNOSIS — B9562 Methicillin resistant Staphylococcus aureus infection as the cause of diseases classified elsewhere: Secondary | ICD-10-CM | POA: Diagnosis not present

## 2017-08-07 DIAGNOSIS — E43 Unspecified severe protein-calorie malnutrition: Secondary | ICD-10-CM | POA: Diagnosis present

## 2017-08-07 DIAGNOSIS — Z886 Allergy status to analgesic agent status: Secondary | ICD-10-CM | POA: Diagnosis not present

## 2017-08-07 DIAGNOSIS — R6520 Severe sepsis without septic shock: Secondary | ICD-10-CM | POA: Diagnosis present

## 2017-08-07 DIAGNOSIS — K6812 Psoas muscle abscess: Secondary | ICD-10-CM | POA: Diagnosis present

## 2017-08-07 DIAGNOSIS — J440 Chronic obstructive pulmonary disease with acute lower respiratory infection: Secondary | ICD-10-CM | POA: Diagnosis present

## 2017-08-07 DIAGNOSIS — M438X4 Other specified deforming dorsopathies, thoracic region: Secondary | ICD-10-CM | POA: Diagnosis not present

## 2017-08-07 DIAGNOSIS — L97929 Non-pressure chronic ulcer of unspecified part of left lower leg with unspecified severity: Secondary | ICD-10-CM | POA: Diagnosis not present

## 2017-08-07 DIAGNOSIS — R21 Rash and other nonspecific skin eruption: Secondary | ICD-10-CM | POA: Diagnosis not present

## 2017-08-07 DIAGNOSIS — Z89511 Acquired absence of right leg below knee: Secondary | ICD-10-CM | POA: Diagnosis not present

## 2017-08-07 DIAGNOSIS — Z7982 Long term (current) use of aspirin: Secondary | ICD-10-CM | POA: Diagnosis not present

## 2017-08-07 DIAGNOSIS — I10 Essential (primary) hypertension: Secondary | ICD-10-CM | POA: Diagnosis not present

## 2017-08-07 DIAGNOSIS — I4510 Unspecified right bundle-branch block: Secondary | ICD-10-CM | POA: Diagnosis present

## 2017-08-07 DIAGNOSIS — M4646 Discitis, unspecified, lumbar region: Secondary | ICD-10-CM | POA: Diagnosis present

## 2017-08-07 DIAGNOSIS — R0902 Hypoxemia: Secondary | ICD-10-CM | POA: Diagnosis not present

## 2017-08-07 DIAGNOSIS — R509 Fever, unspecified: Secondary | ICD-10-CM | POA: Diagnosis not present

## 2017-08-07 DIAGNOSIS — D649 Anemia, unspecified: Secondary | ICD-10-CM | POA: Diagnosis not present

## 2017-08-07 DIAGNOSIS — L89152 Pressure ulcer of sacral region, stage 2: Secondary | ICD-10-CM | POA: Diagnosis present

## 2017-08-07 DIAGNOSIS — Z89519 Acquired absence of unspecified leg below knee: Secondary | ICD-10-CM | POA: Diagnosis not present

## 2017-08-07 DIAGNOSIS — M549 Dorsalgia, unspecified: Secondary | ICD-10-CM | POA: Diagnosis not present

## 2017-08-07 DIAGNOSIS — F329 Major depressive disorder, single episode, unspecified: Secondary | ICD-10-CM | POA: Diagnosis not present

## 2017-08-07 DIAGNOSIS — I5022 Chronic systolic (congestive) heart failure: Secondary | ICD-10-CM | POA: Diagnosis not present

## 2017-08-07 DIAGNOSIS — M47896 Other spondylosis, lumbar region: Secondary | ICD-10-CM | POA: Diagnosis not present

## 2017-08-07 DIAGNOSIS — I480 Paroxysmal atrial fibrillation: Secondary | ICD-10-CM | POA: Diagnosis not present

## 2017-08-07 DIAGNOSIS — M48061 Spinal stenosis, lumbar region without neurogenic claudication: Secondary | ICD-10-CM | POA: Diagnosis not present

## 2017-08-07 MED ORDER — ONDANSETRON 4 MG PO TBDP
4.00 | ORAL_TABLET | ORAL | Status: DC
Start: ? — End: 2017-08-07

## 2017-08-07 MED ORDER — DILTIAZEM HCL 30 MG PO TABS
30.00 | ORAL_TABLET | ORAL | Status: DC
Start: 2017-08-07 — End: 2017-08-07

## 2017-08-07 MED ORDER — GENERIC EXTERNAL MEDICATION
Status: DC
Start: 2017-08-08 — End: 2017-08-07

## 2017-08-07 MED ORDER — MELATONIN 3 MG PO TABS
3.00 | ORAL_TABLET | ORAL | Status: DC
Start: 2017-08-07 — End: 2017-08-07

## 2017-08-07 MED ORDER — POLYETHYLENE GLYCOL 3350 17 G PO PACK
17.00 g | PACK | ORAL | Status: DC
Start: 2017-08-07 — End: 2017-08-07

## 2017-08-07 MED ORDER — SENNOSIDES-DOCUSATE SODIUM 8.6-50 MG PO TABS
2.00 | ORAL_TABLET | ORAL | Status: DC
Start: 2017-08-07 — End: 2017-08-07

## 2017-08-07 MED ORDER — ACETAMINOPHEN 500 MG PO TABS
1000.00 | ORAL_TABLET | ORAL | Status: DC
Start: 2017-08-07 — End: 2017-08-07

## 2017-08-07 MED ORDER — OXYCODONE HCL 5 MG PO TABS
2.50 | ORAL_TABLET | ORAL | Status: DC
Start: ? — End: 2017-08-07

## 2017-08-07 MED ORDER — SODIUM CHLORIDE 0.9 % IV SOLN
INTRAVENOUS | Status: DC
Start: ? — End: 2017-08-07

## 2017-08-07 MED ORDER — ONDANSETRON HCL 4 MG/2ML IJ SOLN
4.00 | INTRAMUSCULAR | Status: DC
Start: ? — End: 2017-08-07

## 2017-08-07 MED ORDER — PANTOPRAZOLE SODIUM 40 MG PO TBEC
40.00 | DELAYED_RELEASE_TABLET | ORAL | Status: DC
Start: 2017-08-08 — End: 2017-08-07

## 2017-08-07 MED ORDER — NYSTATIN 100000 UNIT/GM EX POWD
CUTANEOUS | Status: DC
Start: 2017-08-07 — End: 2017-08-07

## 2017-08-07 MED ORDER — FUROSEMIDE 40 MG PO TABS
40.00 | ORAL_TABLET | ORAL | Status: DC
Start: 2017-08-08 — End: 2017-08-07

## 2017-08-07 MED ORDER — WARFARIN SODIUM 2.5 MG PO TABS
2.50 | ORAL_TABLET | ORAL | Status: DC
Start: ? — End: 2017-08-07

## 2017-08-07 MED ORDER — GENERIC EXTERNAL MEDICATION
1.25 g | Status: DC
Start: 2017-08-08 — End: 2017-08-07

## 2017-08-07 MED ORDER — KETOCONAZOLE 2 % EX SHAM
MEDICATED_SHAMPOO | CUTANEOUS | Status: DC
Start: 2017-08-10 — End: 2017-08-07

## 2017-08-07 MED ORDER — LIDOCAINE 5 % EX PTCH
1.00 | MEDICATED_PATCH | CUTANEOUS | Status: DC
Start: 2017-08-08 — End: 2017-08-07

## 2017-08-07 MED ORDER — GENERIC EXTERNAL MEDICATION
1.00 | Status: DC
Start: ? — End: 2017-08-07

## 2017-08-17 DIAGNOSIS — K6812 Psoas muscle abscess: Secondary | ICD-10-CM | POA: Diagnosis not present

## 2017-08-17 DIAGNOSIS — Z5181 Encounter for therapeutic drug level monitoring: Secondary | ICD-10-CM | POA: Diagnosis not present

## 2017-08-17 DIAGNOSIS — B9562 Methicillin resistant Staphylococcus aureus infection as the cause of diseases classified elsewhere: Secondary | ICD-10-CM | POA: Diagnosis not present

## 2017-08-17 DIAGNOSIS — M4646 Discitis, unspecified, lumbar region: Secondary | ICD-10-CM | POA: Diagnosis not present

## 2017-08-17 DIAGNOSIS — M4626 Osteomyelitis of vertebra, lumbar region: Secondary | ICD-10-CM | POA: Diagnosis not present

## 2017-08-17 DIAGNOSIS — Z7901 Long term (current) use of anticoagulants: Secondary | ICD-10-CM | POA: Diagnosis not present

## 2017-08-17 DIAGNOSIS — I110 Hypertensive heart disease with heart failure: Secondary | ICD-10-CM | POA: Diagnosis not present

## 2017-08-17 DIAGNOSIS — R21 Rash and other nonspecific skin eruption: Secondary | ICD-10-CM | POA: Diagnosis not present

## 2017-08-17 DIAGNOSIS — I739 Peripheral vascular disease, unspecified: Secondary | ICD-10-CM | POA: Diagnosis not present

## 2017-08-17 DIAGNOSIS — I5022 Chronic systolic (congestive) heart failure: Secondary | ICD-10-CM | POA: Diagnosis not present

## 2017-08-17 DIAGNOSIS — B964 Proteus (mirabilis) (morganii) as the cause of diseases classified elsewhere: Secondary | ICD-10-CM | POA: Diagnosis not present

## 2017-08-17 DIAGNOSIS — I4891 Unspecified atrial fibrillation: Secondary | ICD-10-CM | POA: Diagnosis not present

## 2017-08-17 DIAGNOSIS — Z792 Long term (current) use of antibiotics: Secondary | ICD-10-CM | POA: Diagnosis not present

## 2017-08-17 DIAGNOSIS — Z0000 Encounter for general adult medical examination without abnormal findings: Secondary | ICD-10-CM | POA: Diagnosis not present

## 2017-08-18 ENCOUNTER — Other Ambulatory Visit: Payer: Self-pay | Admitting: Licensed Clinical Social Worker

## 2017-08-18 NOTE — Patient Outreach (Signed)
Assessment:  CSW spoke via phone with Ward ChattersBart Lemaster, son of client. CSW verified identity of [name] Tesoro. CSW received verbal permission fom [name] Macquarrie on 08/18/17 for CSW to speak with [name] about client needs and status. Client has been receiving nursing care and physical therapy support at [facility].  [name] had said that it is sometimes difficult for client to participate in physical therapy sessions for client due to pain issuses of client. CSW spoke with [facility] about client care plan. CSW encouraged that client, when he is able, to try to participate in scheduled client physical therapy sessions for client at nursing center [place], [place]. [name] Tirone son of client, is main family support member for [employer]Wyke.  Client has said previously the he sometimes has challenges with swallowing.  Client is eating adequately.  Client is sleeping adequately.  [name] Eichholz said client has pain issues in his back. Client had [facility] for infection in client's back. Client is now back at [facility] to receive care needed. Client is taking a prescribed pain medication.  [name] said client is trying to participate in physical therapy sessions for client as able. [name] said that he thinks that client participation in physical therapy sessions is important for client. CSW spoke with [facility] about [facility] support in nursing, social work and pharmacy.  CSW encouraged [name] to call CSW at [phone] as needed to discuss social work needs of client. CSW thanked [place]art for phone call with CSW on 08/18/17.   Plan:  Client to participate, when he is able, in scheduled  client physical therapy sessions for client in the next 30 days at [facility]  CSW to call client or [name] Lisowski in 4 weeks to assess client needs at that time.  Kelton PillarMichael S.Deandrea Vanpelt MSW, LCSW Licensed Clinical Social Worker [facility] Care Management [phone]

## 2017-08-28 DIAGNOSIS — Z89511 Acquired absence of right leg below knee: Secondary | ICD-10-CM | POA: Diagnosis not present

## 2017-08-28 DIAGNOSIS — M4626 Osteomyelitis of vertebra, lumbar region: Secondary | ICD-10-CM | POA: Diagnosis not present

## 2017-08-28 DIAGNOSIS — L97929 Non-pressure chronic ulcer of unspecified part of left lower leg with unspecified severity: Secondary | ICD-10-CM | POA: Diagnosis not present

## 2017-08-28 DIAGNOSIS — I739 Peripheral vascular disease, unspecified: Secondary | ICD-10-CM | POA: Diagnosis not present

## 2017-08-29 DIAGNOSIS — I10 Essential (primary) hypertension: Secondary | ICD-10-CM | POA: Diagnosis not present

## 2017-08-29 DIAGNOSIS — G890 Central pain syndrome: Secondary | ICD-10-CM | POA: Diagnosis not present

## 2017-08-29 DIAGNOSIS — J441 Chronic obstructive pulmonary disease with (acute) exacerbation: Secondary | ICD-10-CM | POA: Diagnosis not present

## 2017-08-29 DIAGNOSIS — A4102 Sepsis due to Methicillin resistant Staphylococcus aureus: Secondary | ICD-10-CM | POA: Diagnosis not present

## 2017-09-06 DIAGNOSIS — A4902 Methicillin resistant Staphylococcus aureus infection, unspecified site: Secondary | ICD-10-CM | POA: Diagnosis not present

## 2017-09-06 DIAGNOSIS — K6812 Psoas muscle abscess: Secondary | ICD-10-CM | POA: Diagnosis not present

## 2017-09-06 DIAGNOSIS — M4646 Discitis, unspecified, lumbar region: Secondary | ICD-10-CM | POA: Diagnosis not present

## 2017-09-06 DIAGNOSIS — M4626 Osteomyelitis of vertebra, lumbar region: Secondary | ICD-10-CM | POA: Diagnosis not present

## 2017-09-06 DIAGNOSIS — M47896 Other spondylosis, lumbar region: Secondary | ICD-10-CM | POA: Diagnosis not present

## 2017-09-06 DIAGNOSIS — B9562 Methicillin resistant Staphylococcus aureus infection as the cause of diseases classified elsewhere: Secondary | ICD-10-CM | POA: Diagnosis not present

## 2017-09-06 DIAGNOSIS — M4316 Spondylolisthesis, lumbar region: Secondary | ICD-10-CM | POA: Diagnosis not present

## 2017-09-11 DIAGNOSIS — M869 Osteomyelitis, unspecified: Secondary | ICD-10-CM | POA: Diagnosis not present

## 2017-09-17 DIAGNOSIS — I493 Ventricular premature depolarization: Secondary | ICD-10-CM | POA: Diagnosis not present

## 2017-09-17 DIAGNOSIS — Z9889 Other specified postprocedural states: Secondary | ICD-10-CM | POA: Diagnosis not present

## 2017-09-17 DIAGNOSIS — R6520 Severe sepsis without septic shock: Secondary | ICD-10-CM | POA: Diagnosis present

## 2017-09-17 DIAGNOSIS — Z882 Allergy status to sulfonamides status: Secondary | ICD-10-CM | POA: Diagnosis not present

## 2017-09-17 DIAGNOSIS — Z7901 Long term (current) use of anticoagulants: Secondary | ICD-10-CM | POA: Diagnosis not present

## 2017-09-17 DIAGNOSIS — I959 Hypotension, unspecified: Secondary | ICD-10-CM | POA: Diagnosis not present

## 2017-09-17 DIAGNOSIS — I4510 Unspecified right bundle-branch block: Secondary | ICD-10-CM | POA: Diagnosis present

## 2017-09-17 DIAGNOSIS — I4581 Long QT syndrome: Secondary | ICD-10-CM | POA: Diagnosis not present

## 2017-09-17 DIAGNOSIS — M4646 Discitis, unspecified, lumbar region: Secondary | ICD-10-CM | POA: Diagnosis present

## 2017-09-17 DIAGNOSIS — M4626 Osteomyelitis of vertebra, lumbar region: Secondary | ICD-10-CM | POA: Diagnosis present

## 2017-09-17 DIAGNOSIS — L89309 Pressure ulcer of unspecified buttock, unspecified stage: Secondary | ICD-10-CM | POA: Diagnosis not present

## 2017-09-17 DIAGNOSIS — R7881 Bacteremia: Secondary | ICD-10-CM | POA: Diagnosis not present

## 2017-09-17 DIAGNOSIS — R748 Abnormal levels of other serum enzymes: Secondary | ICD-10-CM | POA: Diagnosis not present

## 2017-09-17 DIAGNOSIS — I5020 Unspecified systolic (congestive) heart failure: Secondary | ICD-10-CM | POA: Diagnosis not present

## 2017-09-17 DIAGNOSIS — L89322 Pressure ulcer of left buttock, stage 2: Secondary | ICD-10-CM | POA: Diagnosis not present

## 2017-09-17 DIAGNOSIS — J440 Chronic obstructive pulmonary disease with acute lower respiratory infection: Secondary | ICD-10-CM | POA: Diagnosis present

## 2017-09-17 DIAGNOSIS — I480 Paroxysmal atrial fibrillation: Secondary | ICD-10-CM | POA: Diagnosis not present

## 2017-09-17 DIAGNOSIS — D61818 Other pancytopenia: Secondary | ICD-10-CM | POA: Diagnosis present

## 2017-09-17 DIAGNOSIS — J189 Pneumonia, unspecified organism: Secondary | ICD-10-CM | POA: Diagnosis present

## 2017-09-17 DIAGNOSIS — Z7982 Long term (current) use of aspirin: Secondary | ICD-10-CM | POA: Diagnosis not present

## 2017-09-17 DIAGNOSIS — R0602 Shortness of breath: Secondary | ICD-10-CM | POA: Diagnosis not present

## 2017-09-17 DIAGNOSIS — R918 Other nonspecific abnormal finding of lung field: Secondary | ICD-10-CM | POA: Diagnosis not present

## 2017-09-17 DIAGNOSIS — Z7409 Other reduced mobility: Secondary | ICD-10-CM | POA: Diagnosis not present

## 2017-09-17 DIAGNOSIS — Z89511 Acquired absence of right leg below knee: Secondary | ICD-10-CM | POA: Diagnosis not present

## 2017-09-17 DIAGNOSIS — R2689 Other abnormalities of gait and mobility: Secondary | ICD-10-CM | POA: Diagnosis not present

## 2017-09-17 DIAGNOSIS — E43 Unspecified severe protein-calorie malnutrition: Secondary | ICD-10-CM | POA: Diagnosis present

## 2017-09-17 DIAGNOSIS — I739 Peripheral vascular disease, unspecified: Secondary | ICD-10-CM | POA: Diagnosis present

## 2017-09-17 DIAGNOSIS — K6812 Psoas muscle abscess: Secondary | ICD-10-CM | POA: Diagnosis present

## 2017-09-17 DIAGNOSIS — Z79899 Other long term (current) drug therapy: Secondary | ICD-10-CM | POA: Diagnosis not present

## 2017-09-17 DIAGNOSIS — R509 Fever, unspecified: Secondary | ICD-10-CM | POA: Diagnosis not present

## 2017-09-17 DIAGNOSIS — B9689 Other specified bacterial agents as the cause of diseases classified elsewhere: Secondary | ICD-10-CM | POA: Diagnosis not present

## 2017-09-17 DIAGNOSIS — I482 Chronic atrial fibrillation: Secondary | ICD-10-CM | POA: Diagnosis not present

## 2017-09-17 DIAGNOSIS — R1310 Dysphagia, unspecified: Secondary | ICD-10-CM | POA: Diagnosis not present

## 2017-09-17 DIAGNOSIS — J90 Pleural effusion, not elsewhere classified: Secondary | ICD-10-CM | POA: Diagnosis not present

## 2017-09-17 DIAGNOSIS — K220 Achalasia of cardia: Secondary | ICD-10-CM | POA: Diagnosis present

## 2017-09-17 DIAGNOSIS — I509 Heart failure, unspecified: Secondary | ICD-10-CM | POA: Diagnosis not present

## 2017-09-17 DIAGNOSIS — L89152 Pressure ulcer of sacral region, stage 2: Secondary | ICD-10-CM | POA: Diagnosis present

## 2017-09-17 DIAGNOSIS — M4620 Osteomyelitis of vertebra, site unspecified: Secondary | ICD-10-CM | POA: Diagnosis not present

## 2017-09-17 DIAGNOSIS — E440 Moderate protein-calorie malnutrition: Secondary | ICD-10-CM | POA: Diagnosis not present

## 2017-09-17 DIAGNOSIS — I4891 Unspecified atrial fibrillation: Secondary | ICD-10-CM | POA: Diagnosis present

## 2017-09-17 DIAGNOSIS — D509 Iron deficiency anemia, unspecified: Secondary | ICD-10-CM | POA: Diagnosis present

## 2017-09-17 DIAGNOSIS — R9431 Abnormal electrocardiogram [ECG] [EKG]: Secondary | ICD-10-CM | POA: Diagnosis not present

## 2017-09-17 DIAGNOSIS — R7989 Other specified abnormal findings of blood chemistry: Secondary | ICD-10-CM | POA: Diagnosis not present

## 2017-09-17 DIAGNOSIS — A4159 Other Gram-negative sepsis: Secondary | ICD-10-CM | POA: Diagnosis not present

## 2017-09-17 DIAGNOSIS — L89312 Pressure ulcer of right buttock, stage 2: Secondary | ICD-10-CM | POA: Diagnosis not present

## 2017-09-17 DIAGNOSIS — J449 Chronic obstructive pulmonary disease, unspecified: Secondary | ICD-10-CM | POA: Diagnosis not present

## 2017-09-17 DIAGNOSIS — A419 Sepsis, unspecified organism: Secondary | ICD-10-CM | POA: Diagnosis present

## 2017-09-17 DIAGNOSIS — M549 Dorsalgia, unspecified: Secondary | ICD-10-CM | POA: Diagnosis not present

## 2017-09-17 DIAGNOSIS — Z8619 Personal history of other infectious and parasitic diseases: Secondary | ICD-10-CM | POA: Diagnosis not present

## 2017-09-17 DIAGNOSIS — I5023 Acute on chronic systolic (congestive) heart failure: Secondary | ICD-10-CM | POA: Diagnosis present

## 2017-09-17 DIAGNOSIS — Z959 Presence of cardiac and vascular implant and graft, unspecified: Secondary | ICD-10-CM | POA: Diagnosis not present

## 2017-09-17 DIAGNOSIS — D649 Anemia, unspecified: Secondary | ICD-10-CM | POA: Diagnosis not present

## 2017-09-17 DIAGNOSIS — I110 Hypertensive heart disease with heart failure: Secondary | ICD-10-CM | POA: Diagnosis present

## 2017-09-18 ENCOUNTER — Other Ambulatory Visit: Payer: Self-pay | Admitting: Licensed Clinical Social Worker

## 2017-09-18 MED ORDER — HYDROXYZINE PAMOATE 25 MG PO CAPS
25.00 | ORAL_CAPSULE | ORAL | Status: DC
Start: ? — End: 2017-09-18

## 2017-09-18 MED ORDER — PANTOPRAZOLE SODIUM 40 MG PO TBEC
40.00 | DELAYED_RELEASE_TABLET | ORAL | Status: DC
Start: 2017-09-23 — End: 2017-09-18

## 2017-09-18 MED ORDER — WARFARIN SODIUM 2.5 MG PO TABS
2.50 | ORAL_TABLET | ORAL | Status: DC
Start: 2017-09-18 — End: 2017-09-18

## 2017-09-18 MED ORDER — GENERIC EXTERNAL MEDICATION
1.00 g | Status: DC
Start: 2017-09-18 — End: 2017-09-18

## 2017-09-18 MED ORDER — POTASSIUM CHLORIDE CRYS ER 10 MEQ PO TBCR
EXTENDED_RELEASE_TABLET | ORAL | Status: DC
Start: 2017-09-23 — End: 2017-09-18

## 2017-09-18 MED ORDER — ACETAMINOPHEN 500 MG PO TABS
1000.00 | ORAL_TABLET | ORAL | Status: DC
Start: 2017-09-22 — End: 2017-09-18

## 2017-09-18 MED ORDER — GENERIC EXTERNAL MEDICATION
1.25 g | Status: DC
Start: 2017-09-18 — End: 2017-09-18

## 2017-09-18 MED ORDER — OXYCODONE HCL 5 MG PO TABS
2.50 | ORAL_TABLET | ORAL | Status: DC
Start: ? — End: 2017-09-18

## 2017-09-18 MED ORDER — CHOLECALCIFEROL 25 MCG (1000 UT) PO TABS
1000.00 | ORAL_TABLET | ORAL | Status: DC
Start: 2017-09-23 — End: 2017-09-18

## 2017-09-18 MED ORDER — MELATONIN 3 MG PO TABS
6.00 | ORAL_TABLET | ORAL | Status: DC
Start: 2017-09-22 — End: 2017-09-18

## 2017-09-18 MED ORDER — DILTIAZEM HCL 30 MG PO TABS
30.00 | ORAL_TABLET | ORAL | Status: DC
Start: 2017-09-22 — End: 2017-09-18

## 2017-09-18 MED ORDER — FERROUS SULFATE 325 (65 FE) MG PO TABS
325.00 | ORAL_TABLET | ORAL | Status: DC
Start: 2017-09-23 — End: 2017-09-18

## 2017-09-18 MED ORDER — SENNOSIDES-DOCUSATE SODIUM 8.6-50 MG PO TABS
2.00 | ORAL_TABLET | ORAL | Status: DC
Start: 2017-09-22 — End: 2017-09-18

## 2017-09-18 MED ORDER — ESCITALOPRAM OXALATE 10 MG PO TABS
20.00 | ORAL_TABLET | ORAL | Status: DC
Start: 2017-09-23 — End: 2017-09-18

## 2017-09-18 MED ORDER — ASPIRIN EC 325 MG PO TBEC
325.00 | DELAYED_RELEASE_TABLET | ORAL | Status: DC
Start: 2017-09-19 — End: 2017-09-18

## 2017-09-18 MED ORDER — GENERIC EXTERNAL MEDICATION
500.00 | Status: DC
Start: 2017-09-22 — End: 2017-09-18

## 2017-09-18 MED ORDER — POLYETHYLENE GLYCOL 3350 17 G PO PACK
17.00 g | PACK | ORAL | Status: DC
Start: 2017-09-22 — End: 2017-09-18

## 2017-09-18 MED ORDER — VITAMIN B-12 1000 MCG PO TABS
1000.00 | ORAL_TABLET | ORAL | Status: DC
Start: 2017-09-23 — End: 2017-09-18

## 2017-09-18 NOTE — Patient Outreach (Signed)
Assessment:  CSW spoke via phone with [name] [name], son of client, on 09/18/17.  CSW verified identity of [name] [name]. CSW received verbal permission from [name] [name] on 09/18/17 for CSW to speak with [name] [name] about client  needs and status. [name] reported to CSW that client was [facility].  [facility] said client was receiving oxygen as needed.  He said client was not currently receiving any physical therapy services. [name] said he anticipated that client may be at the hospital for 3-5 days possibly for care needed.  CSW spoke with [facility] about client care plan. CSW encouraged that [name] [name] cooperate with care providers for client in the next 30 days .  [name] said that client hopes to receive care needed at the hospital and hopes to return to skilled nursing care facility when client is able.  [name] ReilBart is helping to manage care needs of [name] [name] and also manage care needs of [name] [name].  CSW thanked [name] [name] for phone call with CSW on 09/18/17. [name] ReilBart has CSW phone number.  CSW encouraged [name] to call CSW as needed to discuss social work needs of client.  [name] was appreciative of phone call from CSW . [name] said that client is communicative and client can make his needs known.   Plan:  Client to cooperate with care providers for client in the next 30 days as these care providers assist client with client care needs.  CSW to call client or [name] [name] in 3 weeks to assess client needs at that time.   [name] [name] [name] [name] MSW, LCSW Licensed Clinical Social Worker [facility] Care [facility] [phone]

## 2017-09-22 MED ORDER — MAGNESIUM OXIDE 400 MG PO TABS
400.00 | ORAL_TABLET | ORAL | Status: DC
Start: 2017-09-22 — End: 2017-09-22

## 2017-09-22 MED ORDER — ATORVASTATIN CALCIUM 10 MG PO TABS
20.00 | ORAL_TABLET | ORAL | Status: DC
Start: ? — End: 2017-09-22

## 2017-09-22 MED ORDER — GENERIC EXTERNAL MEDICATION
250.00 | Status: DC
Start: 2017-09-22 — End: 2017-09-22

## 2017-09-22 MED ORDER — IRON PO
1.00 | ORAL | Status: DC
Start: 2017-09-23 — End: 2017-09-22

## 2017-09-22 MED ORDER — CLINDAMYCIN HCL 150 MG PO CAPS
450.00 | ORAL_CAPSULE | ORAL | Status: DC
Start: 2017-09-22 — End: 2017-09-22

## 2017-09-22 MED ORDER — ONDANSETRON HCL 4 MG/2ML IJ SOLN
4.00 | INTRAMUSCULAR | Status: DC
Start: ? — End: 2017-09-22

## 2017-09-22 MED ORDER — HEPARIN SOD (PORCINE) IN D5W 100 UNIT/ML IV SOLN
5.00 | INTRAVENOUS | Status: DC
Start: ? — End: 2017-09-22

## 2017-09-22 MED ORDER — ALUMINUM-MAGNESIUM-SIMETHICONE 200-200-20 MG/5ML PO SUSP
30.00 | ORAL | Status: DC
Start: ? — End: 2017-09-22

## 2017-09-22 MED ORDER — ASPIRIN EC 81 MG PO TBEC
81.00 | DELAYED_RELEASE_TABLET | ORAL | Status: DC
Start: 2017-09-23 — End: 2017-09-22

## 2017-09-22 MED ORDER — FUROSEMIDE 40 MG PO TABS
40.00 | ORAL_TABLET | ORAL | Status: DC
Start: 2017-09-23 — End: 2017-09-22

## 2017-09-23 DIAGNOSIS — Z7901 Long term (current) use of anticoagulants: Secondary | ICD-10-CM | POA: Diagnosis not present

## 2017-09-23 DIAGNOSIS — Z5181 Encounter for therapeutic drug level monitoring: Secondary | ICD-10-CM | POA: Diagnosis not present

## 2017-09-24 DIAGNOSIS — Z7901 Long term (current) use of anticoagulants: Secondary | ICD-10-CM | POA: Diagnosis not present

## 2017-09-24 DIAGNOSIS — Z5181 Encounter for therapeutic drug level monitoring: Secondary | ICD-10-CM | POA: Diagnosis not present

## 2017-09-27 DIAGNOSIS — Z7901 Long term (current) use of anticoagulants: Secondary | ICD-10-CM | POA: Diagnosis not present

## 2017-09-27 DIAGNOSIS — Z5181 Encounter for therapeutic drug level monitoring: Secondary | ICD-10-CM | POA: Diagnosis not present

## 2017-09-29 DIAGNOSIS — Z7901 Long term (current) use of anticoagulants: Secondary | ICD-10-CM | POA: Diagnosis not present

## 2017-09-29 DIAGNOSIS — Z5181 Encounter for therapeutic drug level monitoring: Secondary | ICD-10-CM | POA: Diagnosis not present

## 2017-10-02 DIAGNOSIS — Z7901 Long term (current) use of anticoagulants: Secondary | ICD-10-CM | POA: Diagnosis not present

## 2017-10-02 DIAGNOSIS — Z5181 Encounter for therapeutic drug level monitoring: Secondary | ICD-10-CM | POA: Diagnosis not present

## 2017-10-04 DIAGNOSIS — Z89511 Acquired absence of right leg below knee: Secondary | ICD-10-CM | POA: Diagnosis not present

## 2017-10-04 DIAGNOSIS — K220 Achalasia of cardia: Secondary | ICD-10-CM | POA: Diagnosis not present

## 2017-10-04 DIAGNOSIS — M869 Osteomyelitis, unspecified: Secondary | ICD-10-CM | POA: Diagnosis not present

## 2017-10-04 DIAGNOSIS — Z7901 Long term (current) use of anticoagulants: Secondary | ICD-10-CM | POA: Diagnosis not present

## 2017-10-04 DIAGNOSIS — R634 Abnormal weight loss: Secondary | ICD-10-CM | POA: Diagnosis not present

## 2017-10-06 DIAGNOSIS — Z5181 Encounter for therapeutic drug level monitoring: Secondary | ICD-10-CM | POA: Diagnosis not present

## 2017-10-06 DIAGNOSIS — Z7901 Long term (current) use of anticoagulants: Secondary | ICD-10-CM | POA: Diagnosis not present

## 2017-10-09 ENCOUNTER — Other Ambulatory Visit: Payer: Self-pay | Admitting: Licensed Clinical Social Worker

## 2017-10-09 NOTE — Patient Outreach (Signed)
Assessment:  CSW spoke via phone with Ward ChattersBart Bedoy, son of client. CSW verified identity of [name] Capurro. CSW received verbal permission from LyerlyBart Fretz on 10/09/17 for CSW to speak with [facility] about client needs. Client [facility]. [facility] and returned to nursing care center.  [name] said that client is receiving needed nursing care at nursing home in [place], [place]. CSW spoke with [facility] about client care plan. CSW encouraged that client cooperate  In next 30 days with care providers for client who are assisting with client care needs.  [name] said that client uses wheelchair to ambulate. Client is taking a prescribed pain medication.  Client enjoys visits with his spouse at nursing facility. [name] said that client could not qualify for Medicaid at present. [name] said he was thinking about client long term care options at present. CSW encouraged [name] to communicate with facility social worker/discharge planner to discuss long term care options for client. CSW thanked [place]Bart for phone call with CSW. CSW encouraged [name] to call CSW at [phone] as needed to discuss social work needs of client.   Plan:  Client to cooperate in next 30 days with care providers for client who are assisting with client care needs  CSW to call client or [name] Godina in 4 weeks to assess client needs.  Kelton PillarMichael S.Chord Takahashi MSW, LCSW Licensed Clinical Social Worker [facility] Care Management [phone]

## 2017-10-11 DIAGNOSIS — K220 Achalasia of cardia: Secondary | ICD-10-CM | POA: Diagnosis not present

## 2017-10-11 DIAGNOSIS — D509 Iron deficiency anemia, unspecified: Secondary | ICD-10-CM | POA: Diagnosis not present

## 2017-10-11 DIAGNOSIS — I4891 Unspecified atrial fibrillation: Secondary | ICD-10-CM | POA: Diagnosis not present

## 2017-10-11 DIAGNOSIS — M4626 Osteomyelitis of vertebra, lumbar region: Secondary | ICD-10-CM | POA: Diagnosis not present

## 2017-10-20 DIAGNOSIS — Z7901 Long term (current) use of anticoagulants: Secondary | ICD-10-CM | POA: Diagnosis not present

## 2017-10-20 DIAGNOSIS — Z5181 Encounter for therapeutic drug level monitoring: Secondary | ICD-10-CM | POA: Diagnosis not present

## 2017-10-24 DIAGNOSIS — J441 Chronic obstructive pulmonary disease with (acute) exacerbation: Secondary | ICD-10-CM | POA: Diagnosis not present

## 2017-10-24 DIAGNOSIS — I10 Essential (primary) hypertension: Secondary | ICD-10-CM | POA: Diagnosis not present

## 2017-10-24 DIAGNOSIS — G890 Central pain syndrome: Secondary | ICD-10-CM | POA: Diagnosis not present

## 2017-10-25 DIAGNOSIS — I4891 Unspecified atrial fibrillation: Secondary | ICD-10-CM | POA: Diagnosis not present

## 2017-10-25 DIAGNOSIS — D509 Iron deficiency anemia, unspecified: Secondary | ICD-10-CM | POA: Diagnosis not present

## 2017-10-25 DIAGNOSIS — M4626 Osteomyelitis of vertebra, lumbar region: Secondary | ICD-10-CM | POA: Diagnosis not present

## 2017-10-25 DIAGNOSIS — K220 Achalasia of cardia: Secondary | ICD-10-CM | POA: Diagnosis not present

## 2017-10-28 DIAGNOSIS — R1312 Dysphagia, oropharyngeal phase: Secondary | ICD-10-CM | POA: Diagnosis not present

## 2017-10-28 DIAGNOSIS — R1314 Dysphagia, pharyngoesophageal phase: Secondary | ICD-10-CM | POA: Diagnosis not present

## 2017-10-28 DIAGNOSIS — R1310 Dysphagia, unspecified: Secondary | ICD-10-CM | POA: Diagnosis not present

## 2017-10-30 DIAGNOSIS — R1310 Dysphagia, unspecified: Secondary | ICD-10-CM | POA: Diagnosis not present

## 2017-10-30 DIAGNOSIS — M4626 Osteomyelitis of vertebra, lumbar region: Secondary | ICD-10-CM | POA: Diagnosis not present

## 2017-10-30 DIAGNOSIS — M47816 Spondylosis without myelopathy or radiculopathy, lumbar region: Secondary | ICD-10-CM | POA: Diagnosis not present

## 2017-10-30 DIAGNOSIS — Z7901 Long term (current) use of anticoagulants: Secondary | ICD-10-CM | POA: Diagnosis not present

## 2017-10-30 DIAGNOSIS — R1312 Dysphagia, oropharyngeal phase: Secondary | ICD-10-CM | POA: Diagnosis not present

## 2017-10-30 DIAGNOSIS — M5126 Other intervertebral disc displacement, lumbar region: Secondary | ICD-10-CM | POA: Diagnosis not present

## 2017-10-30 DIAGNOSIS — M4646 Discitis, unspecified, lumbar region: Secondary | ICD-10-CM | POA: Diagnosis not present

## 2017-10-30 DIAGNOSIS — M4856XD Collapsed vertebra, not elsewhere classified, lumbar region, subsequent encounter for fracture with routine healing: Secondary | ICD-10-CM | POA: Diagnosis not present

## 2017-10-30 DIAGNOSIS — R1314 Dysphagia, pharyngoesophageal phase: Secondary | ICD-10-CM | POA: Diagnosis not present

## 2017-10-30 DIAGNOSIS — Z5181 Encounter for therapeutic drug level monitoring: Secondary | ICD-10-CM | POA: Diagnosis not present

## 2017-10-30 DIAGNOSIS — M4854XD Collapsed vertebra, not elsewhere classified, thoracic region, subsequent encounter for fracture with routine healing: Secondary | ICD-10-CM | POA: Diagnosis not present

## 2017-10-31 DIAGNOSIS — R1310 Dysphagia, unspecified: Secondary | ICD-10-CM | POA: Diagnosis not present

## 2017-10-31 DIAGNOSIS — R1314 Dysphagia, pharyngoesophageal phase: Secondary | ICD-10-CM | POA: Diagnosis not present

## 2017-10-31 DIAGNOSIS — R1312 Dysphagia, oropharyngeal phase: Secondary | ICD-10-CM | POA: Diagnosis not present

## 2017-11-01 DIAGNOSIS — R1310 Dysphagia, unspecified: Secondary | ICD-10-CM | POA: Diagnosis not present

## 2017-11-01 DIAGNOSIS — R1312 Dysphagia, oropharyngeal phase: Secondary | ICD-10-CM | POA: Diagnosis not present

## 2017-11-01 DIAGNOSIS — R1314 Dysphagia, pharyngoesophageal phase: Secondary | ICD-10-CM | POA: Diagnosis not present

## 2017-11-02 DIAGNOSIS — R1312 Dysphagia, oropharyngeal phase: Secondary | ICD-10-CM | POA: Diagnosis not present

## 2017-11-02 DIAGNOSIS — R1310 Dysphagia, unspecified: Secondary | ICD-10-CM | POA: Diagnosis not present

## 2017-11-02 DIAGNOSIS — R1314 Dysphagia, pharyngoesophageal phase: Secondary | ICD-10-CM | POA: Diagnosis not present

## 2017-11-03 DIAGNOSIS — R1312 Dysphagia, oropharyngeal phase: Secondary | ICD-10-CM | POA: Diagnosis not present

## 2017-11-03 DIAGNOSIS — R1314 Dysphagia, pharyngoesophageal phase: Secondary | ICD-10-CM | POA: Diagnosis not present

## 2017-11-03 DIAGNOSIS — R1310 Dysphagia, unspecified: Secondary | ICD-10-CM | POA: Diagnosis not present

## 2017-11-06 DIAGNOSIS — R1312 Dysphagia, oropharyngeal phase: Secondary | ICD-10-CM | POA: Diagnosis not present

## 2017-11-06 DIAGNOSIS — R1314 Dysphagia, pharyngoesophageal phase: Secondary | ICD-10-CM | POA: Diagnosis not present

## 2017-11-06 DIAGNOSIS — R1310 Dysphagia, unspecified: Secondary | ICD-10-CM | POA: Diagnosis not present

## 2017-11-09 DIAGNOSIS — R1312 Dysphagia, oropharyngeal phase: Secondary | ICD-10-CM | POA: Diagnosis not present

## 2017-11-09 DIAGNOSIS — R1314 Dysphagia, pharyngoesophageal phase: Secondary | ICD-10-CM | POA: Diagnosis not present

## 2017-11-09 DIAGNOSIS — R1310 Dysphagia, unspecified: Secondary | ICD-10-CM | POA: Diagnosis not present

## 2017-11-10 ENCOUNTER — Ambulatory Visit: Payer: Self-pay | Admitting: Licensed Clinical Social Worker

## 2017-11-10 DIAGNOSIS — R1312 Dysphagia, oropharyngeal phase: Secondary | ICD-10-CM | POA: Diagnosis not present

## 2017-11-10 DIAGNOSIS — R1314 Dysphagia, pharyngoesophageal phase: Secondary | ICD-10-CM | POA: Diagnosis not present

## 2017-11-10 DIAGNOSIS — R1310 Dysphagia, unspecified: Secondary | ICD-10-CM | POA: Diagnosis not present

## 2017-11-12 DIAGNOSIS — R1314 Dysphagia, pharyngoesophageal phase: Secondary | ICD-10-CM | POA: Diagnosis not present

## 2017-11-12 DIAGNOSIS — R1310 Dysphagia, unspecified: Secondary | ICD-10-CM | POA: Diagnosis not present

## 2017-11-12 DIAGNOSIS — R1312 Dysphagia, oropharyngeal phase: Secondary | ICD-10-CM | POA: Diagnosis not present

## 2017-11-13 DIAGNOSIS — Z7901 Long term (current) use of anticoagulants: Secondary | ICD-10-CM | POA: Diagnosis not present

## 2017-11-13 DIAGNOSIS — R1312 Dysphagia, oropharyngeal phase: Secondary | ICD-10-CM | POA: Diagnosis not present

## 2017-11-13 DIAGNOSIS — Z5181 Encounter for therapeutic drug level monitoring: Secondary | ICD-10-CM | POA: Diagnosis not present

## 2017-11-13 DIAGNOSIS — R1314 Dysphagia, pharyngoesophageal phase: Secondary | ICD-10-CM | POA: Diagnosis not present

## 2017-11-13 DIAGNOSIS — R1310 Dysphagia, unspecified: Secondary | ICD-10-CM | POA: Diagnosis not present

## 2017-11-14 ENCOUNTER — Encounter: Payer: Self-pay | Admitting: Licensed Clinical Social Worker

## 2017-11-14 ENCOUNTER — Other Ambulatory Visit: Payer: Self-pay | Admitting: Licensed Clinical Social Worker

## 2017-11-14 NOTE — Patient Outreach (Signed)
Assessment:  CSW called [facility] on 11/14/17 and spoke via phone with receptionist at that nursing center. Receptionist at that nursing center informed CSW on 11/14/17 that [name] [name] continued to reside at [facility]  to receive care.  [name] [name] has now been at nursing center for a number of months.  He has received nursing care and physical therapy support. Client has met client's care plan goals with CSW services. Also, client plans to remain at nursing center for long term care. Thus, CSW is discharging [name] [name] from [facility] CSW services on 11/14/17.     Plan:  CSW is discharging [name] [name] from [facility] CSW services on 11/14/17 since client has met care plan goals and since client plans to remain at nursing center for long term care.  CSW to fax physician case closure letter to Dr. Luking informing Dr. Luking that CSW discharged client on 11/14/17 from [facility] CSW services.   S. MSW, LCSW Licensed Clinical Social Worker [facility] Care Management [phone] 

## 2017-11-16 DIAGNOSIS — R1310 Dysphagia, unspecified: Secondary | ICD-10-CM | POA: Diagnosis not present

## 2017-11-16 DIAGNOSIS — R1314 Dysphagia, pharyngoesophageal phase: Secondary | ICD-10-CM | POA: Diagnosis not present

## 2017-11-16 DIAGNOSIS — R1312 Dysphagia, oropharyngeal phase: Secondary | ICD-10-CM | POA: Diagnosis not present

## 2017-11-20 DIAGNOSIS — I10 Essential (primary) hypertension: Secondary | ICD-10-CM | POA: Diagnosis not present

## 2017-11-20 DIAGNOSIS — D509 Iron deficiency anemia, unspecified: Secondary | ICD-10-CM | POA: Diagnosis not present

## 2017-11-20 DIAGNOSIS — R531 Weakness: Secondary | ICD-10-CM | POA: Diagnosis not present

## 2017-11-20 DIAGNOSIS — R1310 Dysphagia, unspecified: Secondary | ICD-10-CM | POA: Diagnosis not present

## 2017-11-20 DIAGNOSIS — I739 Peripheral vascular disease, unspecified: Secondary | ICD-10-CM | POA: Diagnosis not present

## 2017-11-20 DIAGNOSIS — I4891 Unspecified atrial fibrillation: Secondary | ICD-10-CM | POA: Diagnosis not present

## 2017-11-20 DIAGNOSIS — R52 Pain, unspecified: Secondary | ICD-10-CM | POA: Diagnosis not present

## 2017-11-20 DIAGNOSIS — K219 Gastro-esophageal reflux disease without esophagitis: Secondary | ICD-10-CM | POA: Diagnosis not present

## 2017-11-20 DIAGNOSIS — R630 Anorexia: Secondary | ICD-10-CM | POA: Diagnosis not present

## 2017-11-20 DIAGNOSIS — J449 Chronic obstructive pulmonary disease, unspecified: Secondary | ICD-10-CM | POA: Diagnosis not present

## 2017-11-20 DIAGNOSIS — I5089 Other heart failure: Secondary | ICD-10-CM | POA: Diagnosis not present

## 2017-11-20 DIAGNOSIS — E43 Unspecified severe protein-calorie malnutrition: Secondary | ICD-10-CM | POA: Diagnosis not present

## 2017-11-21 DIAGNOSIS — I739 Peripheral vascular disease, unspecified: Secondary | ICD-10-CM | POA: Diagnosis not present

## 2017-11-21 DIAGNOSIS — I5089 Other heart failure: Secondary | ICD-10-CM | POA: Diagnosis not present

## 2017-11-21 DIAGNOSIS — E43 Unspecified severe protein-calorie malnutrition: Secondary | ICD-10-CM | POA: Diagnosis not present

## 2017-11-21 DIAGNOSIS — I4891 Unspecified atrial fibrillation: Secondary | ICD-10-CM | POA: Diagnosis not present

## 2017-11-21 DIAGNOSIS — D509 Iron deficiency anemia, unspecified: Secondary | ICD-10-CM | POA: Diagnosis not present

## 2017-11-21 DIAGNOSIS — I10 Essential (primary) hypertension: Secondary | ICD-10-CM | POA: Diagnosis not present

## 2017-11-23 DIAGNOSIS — D509 Iron deficiency anemia, unspecified: Secondary | ICD-10-CM | POA: Diagnosis not present

## 2017-11-23 DIAGNOSIS — I5089 Other heart failure: Secondary | ICD-10-CM | POA: Diagnosis not present

## 2017-11-23 DIAGNOSIS — I4891 Unspecified atrial fibrillation: Secondary | ICD-10-CM | POA: Diagnosis not present

## 2017-11-23 DIAGNOSIS — E43 Unspecified severe protein-calorie malnutrition: Secondary | ICD-10-CM | POA: Diagnosis not present

## 2017-11-23 DIAGNOSIS — I10 Essential (primary) hypertension: Secondary | ICD-10-CM | POA: Diagnosis not present

## 2017-11-23 DIAGNOSIS — I739 Peripheral vascular disease, unspecified: Secondary | ICD-10-CM | POA: Diagnosis not present

## 2017-11-24 DIAGNOSIS — I10 Essential (primary) hypertension: Secondary | ICD-10-CM | POA: Diagnosis not present

## 2017-11-24 DIAGNOSIS — I739 Peripheral vascular disease, unspecified: Secondary | ICD-10-CM | POA: Diagnosis not present

## 2017-11-24 DIAGNOSIS — D509 Iron deficiency anemia, unspecified: Secondary | ICD-10-CM | POA: Diagnosis not present

## 2017-11-24 DIAGNOSIS — I4891 Unspecified atrial fibrillation: Secondary | ICD-10-CM | POA: Diagnosis not present

## 2017-11-24 DIAGNOSIS — I5089 Other heart failure: Secondary | ICD-10-CM | POA: Diagnosis not present

## 2017-11-24 DIAGNOSIS — E43 Unspecified severe protein-calorie malnutrition: Secondary | ICD-10-CM | POA: Diagnosis not present

## 2017-11-25 DIAGNOSIS — I10 Essential (primary) hypertension: Secondary | ICD-10-CM | POA: Diagnosis not present

## 2017-11-25 DIAGNOSIS — E43 Unspecified severe protein-calorie malnutrition: Secondary | ICD-10-CM | POA: Diagnosis not present

## 2017-11-25 DIAGNOSIS — J449 Chronic obstructive pulmonary disease, unspecified: Secondary | ICD-10-CM | POA: Diagnosis not present

## 2017-11-25 DIAGNOSIS — I739 Peripheral vascular disease, unspecified: Secondary | ICD-10-CM | POA: Diagnosis not present

## 2017-11-25 DIAGNOSIS — I4891 Unspecified atrial fibrillation: Secondary | ICD-10-CM | POA: Diagnosis not present

## 2017-11-25 DIAGNOSIS — I5089 Other heart failure: Secondary | ICD-10-CM | POA: Diagnosis not present

## 2017-11-25 DIAGNOSIS — R1310 Dysphagia, unspecified: Secondary | ICD-10-CM | POA: Diagnosis not present

## 2017-11-25 DIAGNOSIS — R52 Pain, unspecified: Secondary | ICD-10-CM | POA: Diagnosis not present

## 2017-11-25 DIAGNOSIS — R531 Weakness: Secondary | ICD-10-CM | POA: Diagnosis not present

## 2017-11-25 DIAGNOSIS — K219 Gastro-esophageal reflux disease without esophagitis: Secondary | ICD-10-CM | POA: Diagnosis not present

## 2017-11-25 DIAGNOSIS — D509 Iron deficiency anemia, unspecified: Secondary | ICD-10-CM | POA: Diagnosis not present

## 2017-11-25 DIAGNOSIS — R630 Anorexia: Secondary | ICD-10-CM | POA: Diagnosis not present

## 2017-11-27 DIAGNOSIS — Z7901 Long term (current) use of anticoagulants: Secondary | ICD-10-CM | POA: Diagnosis not present

## 2017-11-27 DIAGNOSIS — Z5181 Encounter for therapeutic drug level monitoring: Secondary | ICD-10-CM | POA: Diagnosis not present

## 2017-11-28 DIAGNOSIS — D509 Iron deficiency anemia, unspecified: Secondary | ICD-10-CM | POA: Diagnosis not present

## 2017-11-28 DIAGNOSIS — Z7901 Long term (current) use of anticoagulants: Secondary | ICD-10-CM | POA: Diagnosis not present

## 2017-11-28 DIAGNOSIS — E43 Unspecified severe protein-calorie malnutrition: Secondary | ICD-10-CM | POA: Diagnosis not present

## 2017-11-28 DIAGNOSIS — I5089 Other heart failure: Secondary | ICD-10-CM | POA: Diagnosis not present

## 2017-11-28 DIAGNOSIS — I10 Essential (primary) hypertension: Secondary | ICD-10-CM | POA: Diagnosis not present

## 2017-11-28 DIAGNOSIS — Z5181 Encounter for therapeutic drug level monitoring: Secondary | ICD-10-CM | POA: Diagnosis not present

## 2017-11-28 DIAGNOSIS — I739 Peripheral vascular disease, unspecified: Secondary | ICD-10-CM | POA: Diagnosis not present

## 2017-11-28 DIAGNOSIS — I4891 Unspecified atrial fibrillation: Secondary | ICD-10-CM | POA: Diagnosis not present

## 2017-11-30 DIAGNOSIS — I4891 Unspecified atrial fibrillation: Secondary | ICD-10-CM | POA: Diagnosis not present

## 2017-11-30 DIAGNOSIS — I10 Essential (primary) hypertension: Secondary | ICD-10-CM | POA: Diagnosis not present

## 2017-11-30 DIAGNOSIS — D509 Iron deficiency anemia, unspecified: Secondary | ICD-10-CM | POA: Diagnosis not present

## 2017-11-30 DIAGNOSIS — I739 Peripheral vascular disease, unspecified: Secondary | ICD-10-CM | POA: Diagnosis not present

## 2017-11-30 DIAGNOSIS — E43 Unspecified severe protein-calorie malnutrition: Secondary | ICD-10-CM | POA: Diagnosis not present

## 2017-11-30 DIAGNOSIS — I5089 Other heart failure: Secondary | ICD-10-CM | POA: Diagnosis not present

## 2017-12-01 DIAGNOSIS — I4891 Unspecified atrial fibrillation: Secondary | ICD-10-CM | POA: Diagnosis not present

## 2017-12-01 DIAGNOSIS — I739 Peripheral vascular disease, unspecified: Secondary | ICD-10-CM | POA: Diagnosis not present

## 2017-12-01 DIAGNOSIS — D509 Iron deficiency anemia, unspecified: Secondary | ICD-10-CM | POA: Diagnosis not present

## 2017-12-01 DIAGNOSIS — I10 Essential (primary) hypertension: Secondary | ICD-10-CM | POA: Diagnosis not present

## 2017-12-01 DIAGNOSIS — I5089 Other heart failure: Secondary | ICD-10-CM | POA: Diagnosis not present

## 2017-12-01 DIAGNOSIS — E43 Unspecified severe protein-calorie malnutrition: Secondary | ICD-10-CM | POA: Diagnosis not present

## 2017-12-04 DIAGNOSIS — I739 Peripheral vascular disease, unspecified: Secondary | ICD-10-CM | POA: Diagnosis not present

## 2017-12-04 DIAGNOSIS — I5089 Other heart failure: Secondary | ICD-10-CM | POA: Diagnosis not present

## 2017-12-04 DIAGNOSIS — I4891 Unspecified atrial fibrillation: Secondary | ICD-10-CM | POA: Diagnosis not present

## 2017-12-04 DIAGNOSIS — D509 Iron deficiency anemia, unspecified: Secondary | ICD-10-CM | POA: Diagnosis not present

## 2017-12-04 DIAGNOSIS — I10 Essential (primary) hypertension: Secondary | ICD-10-CM | POA: Diagnosis not present

## 2017-12-04 DIAGNOSIS — E43 Unspecified severe protein-calorie malnutrition: Secondary | ICD-10-CM | POA: Diagnosis not present

## 2017-12-05 DIAGNOSIS — E43 Unspecified severe protein-calorie malnutrition: Secondary | ICD-10-CM | POA: Diagnosis not present

## 2017-12-05 DIAGNOSIS — I10 Essential (primary) hypertension: Secondary | ICD-10-CM | POA: Diagnosis not present

## 2017-12-05 DIAGNOSIS — I5089 Other heart failure: Secondary | ICD-10-CM | POA: Diagnosis not present

## 2017-12-05 DIAGNOSIS — I739 Peripheral vascular disease, unspecified: Secondary | ICD-10-CM | POA: Diagnosis not present

## 2017-12-05 DIAGNOSIS — D509 Iron deficiency anemia, unspecified: Secondary | ICD-10-CM | POA: Diagnosis not present

## 2017-12-05 DIAGNOSIS — I4891 Unspecified atrial fibrillation: Secondary | ICD-10-CM | POA: Diagnosis not present

## 2017-12-08 DIAGNOSIS — E43 Unspecified severe protein-calorie malnutrition: Secondary | ICD-10-CM | POA: Diagnosis not present

## 2017-12-08 DIAGNOSIS — J441 Chronic obstructive pulmonary disease with (acute) exacerbation: Secondary | ICD-10-CM | POA: Diagnosis not present

## 2017-12-08 DIAGNOSIS — I5089 Other heart failure: Secondary | ICD-10-CM | POA: Diagnosis not present

## 2017-12-08 DIAGNOSIS — D509 Iron deficiency anemia, unspecified: Secondary | ICD-10-CM | POA: Diagnosis not present

## 2017-12-08 DIAGNOSIS — I10 Essential (primary) hypertension: Secondary | ICD-10-CM | POA: Diagnosis not present

## 2017-12-08 DIAGNOSIS — G890 Central pain syndrome: Secondary | ICD-10-CM | POA: Diagnosis not present

## 2017-12-08 DIAGNOSIS — I739 Peripheral vascular disease, unspecified: Secondary | ICD-10-CM | POA: Diagnosis not present

## 2017-12-08 DIAGNOSIS — I4891 Unspecified atrial fibrillation: Secondary | ICD-10-CM | POA: Diagnosis not present

## 2017-12-11 DIAGNOSIS — I5089 Other heart failure: Secondary | ICD-10-CM | POA: Diagnosis not present

## 2017-12-11 DIAGNOSIS — I4891 Unspecified atrial fibrillation: Secondary | ICD-10-CM | POA: Diagnosis not present

## 2017-12-11 DIAGNOSIS — D509 Iron deficiency anemia, unspecified: Secondary | ICD-10-CM | POA: Diagnosis not present

## 2017-12-11 DIAGNOSIS — I739 Peripheral vascular disease, unspecified: Secondary | ICD-10-CM | POA: Diagnosis not present

## 2017-12-11 DIAGNOSIS — E43 Unspecified severe protein-calorie malnutrition: Secondary | ICD-10-CM | POA: Diagnosis not present

## 2017-12-11 DIAGNOSIS — I10 Essential (primary) hypertension: Secondary | ICD-10-CM | POA: Diagnosis not present

## 2017-12-12 DIAGNOSIS — I739 Peripheral vascular disease, unspecified: Secondary | ICD-10-CM | POA: Diagnosis not present

## 2017-12-12 DIAGNOSIS — D509 Iron deficiency anemia, unspecified: Secondary | ICD-10-CM | POA: Diagnosis not present

## 2017-12-12 DIAGNOSIS — I5089 Other heart failure: Secondary | ICD-10-CM | POA: Diagnosis not present

## 2017-12-12 DIAGNOSIS — I4891 Unspecified atrial fibrillation: Secondary | ICD-10-CM | POA: Diagnosis not present

## 2017-12-12 DIAGNOSIS — E43 Unspecified severe protein-calorie malnutrition: Secondary | ICD-10-CM | POA: Diagnosis not present

## 2017-12-12 DIAGNOSIS — I10 Essential (primary) hypertension: Secondary | ICD-10-CM | POA: Diagnosis not present

## 2017-12-15 DIAGNOSIS — I739 Peripheral vascular disease, unspecified: Secondary | ICD-10-CM | POA: Diagnosis not present

## 2017-12-15 DIAGNOSIS — I10 Essential (primary) hypertension: Secondary | ICD-10-CM | POA: Diagnosis not present

## 2017-12-15 DIAGNOSIS — E43 Unspecified severe protein-calorie malnutrition: Secondary | ICD-10-CM | POA: Diagnosis not present

## 2017-12-15 DIAGNOSIS — I5089 Other heart failure: Secondary | ICD-10-CM | POA: Diagnosis not present

## 2017-12-15 DIAGNOSIS — D509 Iron deficiency anemia, unspecified: Secondary | ICD-10-CM | POA: Diagnosis not present

## 2017-12-15 DIAGNOSIS — I4891 Unspecified atrial fibrillation: Secondary | ICD-10-CM | POA: Diagnosis not present

## 2017-12-18 DIAGNOSIS — I4891 Unspecified atrial fibrillation: Secondary | ICD-10-CM | POA: Diagnosis not present

## 2017-12-18 DIAGNOSIS — E43 Unspecified severe protein-calorie malnutrition: Secondary | ICD-10-CM | POA: Diagnosis not present

## 2017-12-18 DIAGNOSIS — I739 Peripheral vascular disease, unspecified: Secondary | ICD-10-CM | POA: Diagnosis not present

## 2017-12-18 DIAGNOSIS — D509 Iron deficiency anemia, unspecified: Secondary | ICD-10-CM | POA: Diagnosis not present

## 2017-12-18 DIAGNOSIS — I10 Essential (primary) hypertension: Secondary | ICD-10-CM | POA: Diagnosis not present

## 2017-12-18 DIAGNOSIS — I5089 Other heart failure: Secondary | ICD-10-CM | POA: Diagnosis not present

## 2017-12-20 ENCOUNTER — Encounter (HOSPITAL_COMMUNITY): Payer: Medicare Other

## 2017-12-20 ENCOUNTER — Ambulatory Visit: Payer: Medicare Other | Admitting: Vascular Surgery

## 2017-12-21 DIAGNOSIS — I10 Essential (primary) hypertension: Secondary | ICD-10-CM | POA: Diagnosis not present

## 2017-12-21 DIAGNOSIS — E43 Unspecified severe protein-calorie malnutrition: Secondary | ICD-10-CM | POA: Diagnosis not present

## 2017-12-21 DIAGNOSIS — I4891 Unspecified atrial fibrillation: Secondary | ICD-10-CM | POA: Diagnosis not present

## 2017-12-21 DIAGNOSIS — I739 Peripheral vascular disease, unspecified: Secondary | ICD-10-CM | POA: Diagnosis not present

## 2017-12-21 DIAGNOSIS — I5089 Other heart failure: Secondary | ICD-10-CM | POA: Diagnosis not present

## 2017-12-21 DIAGNOSIS — D509 Iron deficiency anemia, unspecified: Secondary | ICD-10-CM | POA: Diagnosis not present

## 2017-12-22 DIAGNOSIS — I10 Essential (primary) hypertension: Secondary | ICD-10-CM | POA: Diagnosis not present

## 2017-12-22 DIAGNOSIS — I4891 Unspecified atrial fibrillation: Secondary | ICD-10-CM | POA: Diagnosis not present

## 2017-12-22 DIAGNOSIS — D509 Iron deficiency anemia, unspecified: Secondary | ICD-10-CM | POA: Diagnosis not present

## 2017-12-22 DIAGNOSIS — I739 Peripheral vascular disease, unspecified: Secondary | ICD-10-CM | POA: Diagnosis not present

## 2017-12-22 DIAGNOSIS — I5089 Other heart failure: Secondary | ICD-10-CM | POA: Diagnosis not present

## 2017-12-22 DIAGNOSIS — E43 Unspecified severe protein-calorie malnutrition: Secondary | ICD-10-CM | POA: Diagnosis not present

## 2017-12-25 DIAGNOSIS — I5089 Other heart failure: Secondary | ICD-10-CM | POA: Diagnosis not present

## 2017-12-25 DIAGNOSIS — I739 Peripheral vascular disease, unspecified: Secondary | ICD-10-CM | POA: Diagnosis not present

## 2017-12-25 DIAGNOSIS — R1310 Dysphagia, unspecified: Secondary | ICD-10-CM | POA: Diagnosis not present

## 2017-12-25 DIAGNOSIS — K219 Gastro-esophageal reflux disease without esophagitis: Secondary | ICD-10-CM | POA: Diagnosis not present

## 2017-12-25 DIAGNOSIS — E43 Unspecified severe protein-calorie malnutrition: Secondary | ICD-10-CM | POA: Diagnosis not present

## 2017-12-25 DIAGNOSIS — I4891 Unspecified atrial fibrillation: Secondary | ICD-10-CM | POA: Diagnosis not present

## 2017-12-25 DIAGNOSIS — R52 Pain, unspecified: Secondary | ICD-10-CM | POA: Diagnosis not present

## 2017-12-25 DIAGNOSIS — J449 Chronic obstructive pulmonary disease, unspecified: Secondary | ICD-10-CM | POA: Diagnosis not present

## 2017-12-25 DIAGNOSIS — R531 Weakness: Secondary | ICD-10-CM | POA: Diagnosis not present

## 2017-12-25 DIAGNOSIS — R630 Anorexia: Secondary | ICD-10-CM | POA: Diagnosis not present

## 2017-12-25 DIAGNOSIS — D509 Iron deficiency anemia, unspecified: Secondary | ICD-10-CM | POA: Diagnosis not present

## 2017-12-25 DIAGNOSIS — I10 Essential (primary) hypertension: Secondary | ICD-10-CM | POA: Diagnosis not present

## 2017-12-29 DIAGNOSIS — I739 Peripheral vascular disease, unspecified: Secondary | ICD-10-CM | POA: Diagnosis not present

## 2017-12-29 DIAGNOSIS — E43 Unspecified severe protein-calorie malnutrition: Secondary | ICD-10-CM | POA: Diagnosis not present

## 2017-12-29 DIAGNOSIS — I10 Essential (primary) hypertension: Secondary | ICD-10-CM | POA: Diagnosis not present

## 2017-12-29 DIAGNOSIS — I5089 Other heart failure: Secondary | ICD-10-CM | POA: Diagnosis not present

## 2017-12-29 DIAGNOSIS — I4891 Unspecified atrial fibrillation: Secondary | ICD-10-CM | POA: Diagnosis not present

## 2017-12-29 DIAGNOSIS — D509 Iron deficiency anemia, unspecified: Secondary | ICD-10-CM | POA: Diagnosis not present

## 2018-01-01 DIAGNOSIS — I10 Essential (primary) hypertension: Secondary | ICD-10-CM | POA: Diagnosis not present

## 2018-01-01 DIAGNOSIS — I739 Peripheral vascular disease, unspecified: Secondary | ICD-10-CM | POA: Diagnosis not present

## 2018-01-01 DIAGNOSIS — I4891 Unspecified atrial fibrillation: Secondary | ICD-10-CM | POA: Diagnosis not present

## 2018-01-01 DIAGNOSIS — I5089 Other heart failure: Secondary | ICD-10-CM | POA: Diagnosis not present

## 2018-01-01 DIAGNOSIS — D509 Iron deficiency anemia, unspecified: Secondary | ICD-10-CM | POA: Diagnosis not present

## 2018-01-01 DIAGNOSIS — E43 Unspecified severe protein-calorie malnutrition: Secondary | ICD-10-CM | POA: Diagnosis not present

## 2018-01-02 DIAGNOSIS — I5089 Other heart failure: Secondary | ICD-10-CM | POA: Diagnosis not present

## 2018-01-02 DIAGNOSIS — D509 Iron deficiency anemia, unspecified: Secondary | ICD-10-CM | POA: Diagnosis not present

## 2018-01-02 DIAGNOSIS — E43 Unspecified severe protein-calorie malnutrition: Secondary | ICD-10-CM | POA: Diagnosis not present

## 2018-01-02 DIAGNOSIS — I4891 Unspecified atrial fibrillation: Secondary | ICD-10-CM | POA: Diagnosis not present

## 2018-01-02 DIAGNOSIS — I739 Peripheral vascular disease, unspecified: Secondary | ICD-10-CM | POA: Diagnosis not present

## 2018-01-02 DIAGNOSIS — I10 Essential (primary) hypertension: Secondary | ICD-10-CM | POA: Diagnosis not present

## 2018-01-04 DIAGNOSIS — I739 Peripheral vascular disease, unspecified: Secondary | ICD-10-CM | POA: Diagnosis not present

## 2018-01-04 DIAGNOSIS — D509 Iron deficiency anemia, unspecified: Secondary | ICD-10-CM | POA: Diagnosis not present

## 2018-01-04 DIAGNOSIS — I4891 Unspecified atrial fibrillation: Secondary | ICD-10-CM | POA: Diagnosis not present

## 2018-01-04 DIAGNOSIS — I5089 Other heart failure: Secondary | ICD-10-CM | POA: Diagnosis not present

## 2018-01-04 DIAGNOSIS — I10 Essential (primary) hypertension: Secondary | ICD-10-CM | POA: Diagnosis not present

## 2018-01-04 DIAGNOSIS — E43 Unspecified severe protein-calorie malnutrition: Secondary | ICD-10-CM | POA: Diagnosis not present

## 2018-01-05 DIAGNOSIS — I10 Essential (primary) hypertension: Secondary | ICD-10-CM | POA: Diagnosis not present

## 2018-01-05 DIAGNOSIS — I5089 Other heart failure: Secondary | ICD-10-CM | POA: Diagnosis not present

## 2018-01-05 DIAGNOSIS — D509 Iron deficiency anemia, unspecified: Secondary | ICD-10-CM | POA: Diagnosis not present

## 2018-01-05 DIAGNOSIS — I4891 Unspecified atrial fibrillation: Secondary | ICD-10-CM | POA: Diagnosis not present

## 2018-01-05 DIAGNOSIS — I739 Peripheral vascular disease, unspecified: Secondary | ICD-10-CM | POA: Diagnosis not present

## 2018-01-05 DIAGNOSIS — E43 Unspecified severe protein-calorie malnutrition: Secondary | ICD-10-CM | POA: Diagnosis not present

## 2018-01-08 DIAGNOSIS — I739 Peripheral vascular disease, unspecified: Secondary | ICD-10-CM | POA: Diagnosis not present

## 2018-01-08 DIAGNOSIS — I4891 Unspecified atrial fibrillation: Secondary | ICD-10-CM | POA: Diagnosis not present

## 2018-01-08 DIAGNOSIS — I5089 Other heart failure: Secondary | ICD-10-CM | POA: Diagnosis not present

## 2018-01-08 DIAGNOSIS — E43 Unspecified severe protein-calorie malnutrition: Secondary | ICD-10-CM | POA: Diagnosis not present

## 2018-01-08 DIAGNOSIS — D509 Iron deficiency anemia, unspecified: Secondary | ICD-10-CM | POA: Diagnosis not present

## 2018-01-08 DIAGNOSIS — I10 Essential (primary) hypertension: Secondary | ICD-10-CM | POA: Diagnosis not present

## 2018-01-11 DIAGNOSIS — I739 Peripheral vascular disease, unspecified: Secondary | ICD-10-CM | POA: Diagnosis not present

## 2018-01-11 DIAGNOSIS — E43 Unspecified severe protein-calorie malnutrition: Secondary | ICD-10-CM | POA: Diagnosis not present

## 2018-01-11 DIAGNOSIS — I5089 Other heart failure: Secondary | ICD-10-CM | POA: Diagnosis not present

## 2018-01-11 DIAGNOSIS — D509 Iron deficiency anemia, unspecified: Secondary | ICD-10-CM | POA: Diagnosis not present

## 2018-01-11 DIAGNOSIS — I10 Essential (primary) hypertension: Secondary | ICD-10-CM | POA: Diagnosis not present

## 2018-01-11 DIAGNOSIS — I4891 Unspecified atrial fibrillation: Secondary | ICD-10-CM | POA: Diagnosis not present

## 2018-01-12 DIAGNOSIS — I4891 Unspecified atrial fibrillation: Secondary | ICD-10-CM | POA: Diagnosis not present

## 2018-01-12 DIAGNOSIS — I739 Peripheral vascular disease, unspecified: Secondary | ICD-10-CM | POA: Diagnosis not present

## 2018-01-12 DIAGNOSIS — E43 Unspecified severe protein-calorie malnutrition: Secondary | ICD-10-CM | POA: Diagnosis not present

## 2018-01-12 DIAGNOSIS — I10 Essential (primary) hypertension: Secondary | ICD-10-CM | POA: Diagnosis not present

## 2018-01-12 DIAGNOSIS — D509 Iron deficiency anemia, unspecified: Secondary | ICD-10-CM | POA: Diagnosis not present

## 2018-01-12 DIAGNOSIS — I5089 Other heart failure: Secondary | ICD-10-CM | POA: Diagnosis not present

## 2018-01-13 DIAGNOSIS — G890 Central pain syndrome: Secondary | ICD-10-CM | POA: Diagnosis not present

## 2018-01-13 DIAGNOSIS — I10 Essential (primary) hypertension: Secondary | ICD-10-CM | POA: Diagnosis not present

## 2018-01-13 DIAGNOSIS — J441 Chronic obstructive pulmonary disease with (acute) exacerbation: Secondary | ICD-10-CM | POA: Diagnosis not present

## 2018-01-15 DIAGNOSIS — E43 Unspecified severe protein-calorie malnutrition: Secondary | ICD-10-CM | POA: Diagnosis not present

## 2018-01-15 DIAGNOSIS — I739 Peripheral vascular disease, unspecified: Secondary | ICD-10-CM | POA: Diagnosis not present

## 2018-01-15 DIAGNOSIS — I5089 Other heart failure: Secondary | ICD-10-CM | POA: Diagnosis not present

## 2018-01-15 DIAGNOSIS — I4891 Unspecified atrial fibrillation: Secondary | ICD-10-CM | POA: Diagnosis not present

## 2018-01-15 DIAGNOSIS — D509 Iron deficiency anemia, unspecified: Secondary | ICD-10-CM | POA: Diagnosis not present

## 2018-01-15 DIAGNOSIS — I10 Essential (primary) hypertension: Secondary | ICD-10-CM | POA: Diagnosis not present

## 2018-01-16 DIAGNOSIS — I739 Peripheral vascular disease, unspecified: Secondary | ICD-10-CM | POA: Diagnosis not present

## 2018-01-16 DIAGNOSIS — B351 Tinea unguium: Secondary | ICD-10-CM | POA: Diagnosis not present

## 2018-01-16 DIAGNOSIS — D509 Iron deficiency anemia, unspecified: Secondary | ICD-10-CM | POA: Diagnosis not present

## 2018-01-16 DIAGNOSIS — I10 Essential (primary) hypertension: Secondary | ICD-10-CM | POA: Diagnosis not present

## 2018-01-16 DIAGNOSIS — E43 Unspecified severe protein-calorie malnutrition: Secondary | ICD-10-CM | POA: Diagnosis not present

## 2018-01-16 DIAGNOSIS — Z89511 Acquired absence of right leg below knee: Secondary | ICD-10-CM | POA: Diagnosis not present

## 2018-01-16 DIAGNOSIS — I5089 Other heart failure: Secondary | ICD-10-CM | POA: Diagnosis not present

## 2018-01-16 DIAGNOSIS — I4891 Unspecified atrial fibrillation: Secondary | ICD-10-CM | POA: Diagnosis not present

## 2018-01-19 DIAGNOSIS — I739 Peripheral vascular disease, unspecified: Secondary | ICD-10-CM | POA: Diagnosis not present

## 2018-01-19 DIAGNOSIS — I5089 Other heart failure: Secondary | ICD-10-CM | POA: Diagnosis not present

## 2018-01-19 DIAGNOSIS — D509 Iron deficiency anemia, unspecified: Secondary | ICD-10-CM | POA: Diagnosis not present

## 2018-01-19 DIAGNOSIS — I10 Essential (primary) hypertension: Secondary | ICD-10-CM | POA: Diagnosis not present

## 2018-01-19 DIAGNOSIS — E43 Unspecified severe protein-calorie malnutrition: Secondary | ICD-10-CM | POA: Diagnosis not present

## 2018-01-19 DIAGNOSIS — I4891 Unspecified atrial fibrillation: Secondary | ICD-10-CM | POA: Diagnosis not present

## 2018-01-22 DIAGNOSIS — E43 Unspecified severe protein-calorie malnutrition: Secondary | ICD-10-CM | POA: Diagnosis not present

## 2018-01-22 DIAGNOSIS — I5089 Other heart failure: Secondary | ICD-10-CM | POA: Diagnosis not present

## 2018-01-22 DIAGNOSIS — I739 Peripheral vascular disease, unspecified: Secondary | ICD-10-CM | POA: Diagnosis not present

## 2018-01-22 DIAGNOSIS — I10 Essential (primary) hypertension: Secondary | ICD-10-CM | POA: Diagnosis not present

## 2018-01-22 DIAGNOSIS — D509 Iron deficiency anemia, unspecified: Secondary | ICD-10-CM | POA: Diagnosis not present

## 2018-01-22 DIAGNOSIS — I4891 Unspecified atrial fibrillation: Secondary | ICD-10-CM | POA: Diagnosis not present

## 2018-01-23 DIAGNOSIS — I739 Peripheral vascular disease, unspecified: Secondary | ICD-10-CM | POA: Diagnosis not present

## 2018-01-23 DIAGNOSIS — I5089 Other heart failure: Secondary | ICD-10-CM | POA: Diagnosis not present

## 2018-01-23 DIAGNOSIS — I4891 Unspecified atrial fibrillation: Secondary | ICD-10-CM | POA: Diagnosis not present

## 2018-01-23 DIAGNOSIS — E43 Unspecified severe protein-calorie malnutrition: Secondary | ICD-10-CM | POA: Diagnosis not present

## 2018-01-23 DIAGNOSIS — D509 Iron deficiency anemia, unspecified: Secondary | ICD-10-CM | POA: Diagnosis not present

## 2018-01-23 DIAGNOSIS — I10 Essential (primary) hypertension: Secondary | ICD-10-CM | POA: Diagnosis not present

## 2018-01-25 DIAGNOSIS — D509 Iron deficiency anemia, unspecified: Secondary | ICD-10-CM | POA: Diagnosis not present

## 2018-01-25 DIAGNOSIS — E43 Unspecified severe protein-calorie malnutrition: Secondary | ICD-10-CM | POA: Diagnosis not present

## 2018-01-25 DIAGNOSIS — I5089 Other heart failure: Secondary | ICD-10-CM | POA: Diagnosis not present

## 2018-01-25 DIAGNOSIS — R531 Weakness: Secondary | ICD-10-CM | POA: Diagnosis not present

## 2018-01-25 DIAGNOSIS — R630 Anorexia: Secondary | ICD-10-CM | POA: Diagnosis not present

## 2018-01-25 DIAGNOSIS — I739 Peripheral vascular disease, unspecified: Secondary | ICD-10-CM | POA: Diagnosis not present

## 2018-01-25 DIAGNOSIS — R52 Pain, unspecified: Secondary | ICD-10-CM | POA: Diagnosis not present

## 2018-01-25 DIAGNOSIS — I10 Essential (primary) hypertension: Secondary | ICD-10-CM | POA: Diagnosis not present

## 2018-01-25 DIAGNOSIS — J449 Chronic obstructive pulmonary disease, unspecified: Secondary | ICD-10-CM | POA: Diagnosis not present

## 2018-01-25 DIAGNOSIS — I4891 Unspecified atrial fibrillation: Secondary | ICD-10-CM | POA: Diagnosis not present

## 2018-01-25 DIAGNOSIS — K219 Gastro-esophageal reflux disease without esophagitis: Secondary | ICD-10-CM | POA: Diagnosis not present

## 2018-01-25 DIAGNOSIS — R1310 Dysphagia, unspecified: Secondary | ICD-10-CM | POA: Diagnosis not present

## 2018-01-29 DIAGNOSIS — I739 Peripheral vascular disease, unspecified: Secondary | ICD-10-CM | POA: Diagnosis not present

## 2018-01-29 DIAGNOSIS — I10 Essential (primary) hypertension: Secondary | ICD-10-CM | POA: Diagnosis not present

## 2018-01-29 DIAGNOSIS — I4891 Unspecified atrial fibrillation: Secondary | ICD-10-CM | POA: Diagnosis not present

## 2018-01-29 DIAGNOSIS — I5089 Other heart failure: Secondary | ICD-10-CM | POA: Diagnosis not present

## 2018-01-29 DIAGNOSIS — E43 Unspecified severe protein-calorie malnutrition: Secondary | ICD-10-CM | POA: Diagnosis not present

## 2018-01-29 DIAGNOSIS — D509 Iron deficiency anemia, unspecified: Secondary | ICD-10-CM | POA: Diagnosis not present

## 2018-01-30 DIAGNOSIS — D509 Iron deficiency anemia, unspecified: Secondary | ICD-10-CM | POA: Diagnosis not present

## 2018-01-30 DIAGNOSIS — I739 Peripheral vascular disease, unspecified: Secondary | ICD-10-CM | POA: Diagnosis not present

## 2018-01-30 DIAGNOSIS — E43 Unspecified severe protein-calorie malnutrition: Secondary | ICD-10-CM | POA: Diagnosis not present

## 2018-01-30 DIAGNOSIS — I10 Essential (primary) hypertension: Secondary | ICD-10-CM | POA: Diagnosis not present

## 2018-01-30 DIAGNOSIS — I4891 Unspecified atrial fibrillation: Secondary | ICD-10-CM | POA: Diagnosis not present

## 2018-01-30 DIAGNOSIS — I5089 Other heart failure: Secondary | ICD-10-CM | POA: Diagnosis not present

## 2018-02-01 DIAGNOSIS — I10 Essential (primary) hypertension: Secondary | ICD-10-CM | POA: Diagnosis not present

## 2018-02-01 DIAGNOSIS — D509 Iron deficiency anemia, unspecified: Secondary | ICD-10-CM | POA: Diagnosis not present

## 2018-02-01 DIAGNOSIS — I739 Peripheral vascular disease, unspecified: Secondary | ICD-10-CM | POA: Diagnosis not present

## 2018-02-01 DIAGNOSIS — I4891 Unspecified atrial fibrillation: Secondary | ICD-10-CM | POA: Diagnosis not present

## 2018-02-01 DIAGNOSIS — I5089 Other heart failure: Secondary | ICD-10-CM | POA: Diagnosis not present

## 2018-02-01 DIAGNOSIS — E43 Unspecified severe protein-calorie malnutrition: Secondary | ICD-10-CM | POA: Diagnosis not present

## 2018-02-02 DIAGNOSIS — I4891 Unspecified atrial fibrillation: Secondary | ICD-10-CM | POA: Diagnosis not present

## 2018-02-02 DIAGNOSIS — I739 Peripheral vascular disease, unspecified: Secondary | ICD-10-CM | POA: Diagnosis not present

## 2018-02-02 DIAGNOSIS — D509 Iron deficiency anemia, unspecified: Secondary | ICD-10-CM | POA: Diagnosis not present

## 2018-02-02 DIAGNOSIS — I10 Essential (primary) hypertension: Secondary | ICD-10-CM | POA: Diagnosis not present

## 2018-02-02 DIAGNOSIS — E43 Unspecified severe protein-calorie malnutrition: Secondary | ICD-10-CM | POA: Diagnosis not present

## 2018-02-02 DIAGNOSIS — I5089 Other heart failure: Secondary | ICD-10-CM | POA: Diagnosis not present

## 2018-02-05 DIAGNOSIS — I4891 Unspecified atrial fibrillation: Secondary | ICD-10-CM | POA: Diagnosis not present

## 2018-02-05 DIAGNOSIS — I739 Peripheral vascular disease, unspecified: Secondary | ICD-10-CM | POA: Diagnosis not present

## 2018-02-05 DIAGNOSIS — I10 Essential (primary) hypertension: Secondary | ICD-10-CM | POA: Diagnosis not present

## 2018-02-05 DIAGNOSIS — I5089 Other heart failure: Secondary | ICD-10-CM | POA: Diagnosis not present

## 2018-02-05 DIAGNOSIS — E43 Unspecified severe protein-calorie malnutrition: Secondary | ICD-10-CM | POA: Diagnosis not present

## 2018-02-05 DIAGNOSIS — D509 Iron deficiency anemia, unspecified: Secondary | ICD-10-CM | POA: Diagnosis not present

## 2018-02-06 DIAGNOSIS — I5089 Other heart failure: Secondary | ICD-10-CM | POA: Diagnosis not present

## 2018-02-06 DIAGNOSIS — I10 Essential (primary) hypertension: Secondary | ICD-10-CM | POA: Diagnosis not present

## 2018-02-06 DIAGNOSIS — D509 Iron deficiency anemia, unspecified: Secondary | ICD-10-CM | POA: Diagnosis not present

## 2018-02-06 DIAGNOSIS — E43 Unspecified severe protein-calorie malnutrition: Secondary | ICD-10-CM | POA: Diagnosis not present

## 2018-02-06 DIAGNOSIS — I4891 Unspecified atrial fibrillation: Secondary | ICD-10-CM | POA: Diagnosis not present

## 2018-02-06 DIAGNOSIS — I739 Peripheral vascular disease, unspecified: Secondary | ICD-10-CM | POA: Diagnosis not present

## 2018-02-09 DIAGNOSIS — D509 Iron deficiency anemia, unspecified: Secondary | ICD-10-CM | POA: Diagnosis not present

## 2018-02-09 DIAGNOSIS — I10 Essential (primary) hypertension: Secondary | ICD-10-CM | POA: Diagnosis not present

## 2018-02-09 DIAGNOSIS — E43 Unspecified severe protein-calorie malnutrition: Secondary | ICD-10-CM | POA: Diagnosis not present

## 2018-02-09 DIAGNOSIS — I4891 Unspecified atrial fibrillation: Secondary | ICD-10-CM | POA: Diagnosis not present

## 2018-02-09 DIAGNOSIS — I739 Peripheral vascular disease, unspecified: Secondary | ICD-10-CM | POA: Diagnosis not present

## 2018-02-09 DIAGNOSIS — I5089 Other heart failure: Secondary | ICD-10-CM | POA: Diagnosis not present

## 2018-02-12 DIAGNOSIS — E43 Unspecified severe protein-calorie malnutrition: Secondary | ICD-10-CM | POA: Diagnosis not present

## 2018-02-12 DIAGNOSIS — I739 Peripheral vascular disease, unspecified: Secondary | ICD-10-CM | POA: Diagnosis not present

## 2018-02-12 DIAGNOSIS — I5089 Other heart failure: Secondary | ICD-10-CM | POA: Diagnosis not present

## 2018-02-12 DIAGNOSIS — D509 Iron deficiency anemia, unspecified: Secondary | ICD-10-CM | POA: Diagnosis not present

## 2018-02-12 DIAGNOSIS — I10 Essential (primary) hypertension: Secondary | ICD-10-CM | POA: Diagnosis not present

## 2018-02-12 DIAGNOSIS — I4891 Unspecified atrial fibrillation: Secondary | ICD-10-CM | POA: Diagnosis not present

## 2018-02-13 DIAGNOSIS — I10 Essential (primary) hypertension: Secondary | ICD-10-CM | POA: Diagnosis not present

## 2018-02-13 DIAGNOSIS — E43 Unspecified severe protein-calorie malnutrition: Secondary | ICD-10-CM | POA: Diagnosis not present

## 2018-02-13 DIAGNOSIS — D509 Iron deficiency anemia, unspecified: Secondary | ICD-10-CM | POA: Diagnosis not present

## 2018-02-13 DIAGNOSIS — I5089 Other heart failure: Secondary | ICD-10-CM | POA: Diagnosis not present

## 2018-02-13 DIAGNOSIS — I739 Peripheral vascular disease, unspecified: Secondary | ICD-10-CM | POA: Diagnosis not present

## 2018-02-13 DIAGNOSIS — I4891 Unspecified atrial fibrillation: Secondary | ICD-10-CM | POA: Diagnosis not present

## 2018-02-16 DIAGNOSIS — D509 Iron deficiency anemia, unspecified: Secondary | ICD-10-CM | POA: Diagnosis not present

## 2018-02-16 DIAGNOSIS — E43 Unspecified severe protein-calorie malnutrition: Secondary | ICD-10-CM | POA: Diagnosis not present

## 2018-02-16 DIAGNOSIS — I5089 Other heart failure: Secondary | ICD-10-CM | POA: Diagnosis not present

## 2018-02-16 DIAGNOSIS — I4891 Unspecified atrial fibrillation: Secondary | ICD-10-CM | POA: Diagnosis not present

## 2018-02-16 DIAGNOSIS — I10 Essential (primary) hypertension: Secondary | ICD-10-CM | POA: Diagnosis not present

## 2018-02-16 DIAGNOSIS — I739 Peripheral vascular disease, unspecified: Secondary | ICD-10-CM | POA: Diagnosis not present

## 2018-02-19 DIAGNOSIS — M6281 Muscle weakness (generalized): Secondary | ICD-10-CM | POA: Diagnosis not present

## 2018-02-20 DIAGNOSIS — E43 Unspecified severe protein-calorie malnutrition: Secondary | ICD-10-CM | POA: Diagnosis not present

## 2018-02-20 DIAGNOSIS — I5089 Other heart failure: Secondary | ICD-10-CM | POA: Diagnosis not present

## 2018-02-20 DIAGNOSIS — I4891 Unspecified atrial fibrillation: Secondary | ICD-10-CM | POA: Diagnosis not present

## 2018-02-20 DIAGNOSIS — I739 Peripheral vascular disease, unspecified: Secondary | ICD-10-CM | POA: Diagnosis not present

## 2018-02-20 DIAGNOSIS — I10 Essential (primary) hypertension: Secondary | ICD-10-CM | POA: Diagnosis not present

## 2018-02-20 DIAGNOSIS — D509 Iron deficiency anemia, unspecified: Secondary | ICD-10-CM | POA: Diagnosis not present

## 2018-02-21 DIAGNOSIS — Z7952 Long term (current) use of systemic steroids: Secondary | ICD-10-CM | POA: Diagnosis not present

## 2018-02-21 DIAGNOSIS — H26493 Other secondary cataract, bilateral: Secondary | ICD-10-CM | POA: Diagnosis not present

## 2018-02-21 DIAGNOSIS — Z961 Presence of intraocular lens: Secondary | ICD-10-CM | POA: Diagnosis not present

## 2018-02-21 DIAGNOSIS — H524 Presbyopia: Secondary | ICD-10-CM | POA: Diagnosis not present

## 2018-02-21 DIAGNOSIS — I10 Essential (primary) hypertension: Secondary | ICD-10-CM | POA: Diagnosis not present

## 2018-02-21 DIAGNOSIS — D509 Iron deficiency anemia, unspecified: Secondary | ICD-10-CM | POA: Diagnosis not present

## 2018-02-21 DIAGNOSIS — I4891 Unspecified atrial fibrillation: Secondary | ICD-10-CM | POA: Diagnosis not present

## 2018-02-21 DIAGNOSIS — I5089 Other heart failure: Secondary | ICD-10-CM | POA: Diagnosis not present

## 2018-02-21 DIAGNOSIS — I739 Peripheral vascular disease, unspecified: Secondary | ICD-10-CM | POA: Diagnosis not present

## 2018-02-21 DIAGNOSIS — E43 Unspecified severe protein-calorie malnutrition: Secondary | ICD-10-CM | POA: Diagnosis not present

## 2018-02-23 DIAGNOSIS — D509 Iron deficiency anemia, unspecified: Secondary | ICD-10-CM | POA: Diagnosis not present

## 2018-02-23 DIAGNOSIS — I739 Peripheral vascular disease, unspecified: Secondary | ICD-10-CM | POA: Diagnosis not present

## 2018-02-23 DIAGNOSIS — E43 Unspecified severe protein-calorie malnutrition: Secondary | ICD-10-CM | POA: Diagnosis not present

## 2018-02-23 DIAGNOSIS — I5089 Other heart failure: Secondary | ICD-10-CM | POA: Diagnosis not present

## 2018-02-23 DIAGNOSIS — I4891 Unspecified atrial fibrillation: Secondary | ICD-10-CM | POA: Diagnosis not present

## 2018-02-23 DIAGNOSIS — I10 Essential (primary) hypertension: Secondary | ICD-10-CM | POA: Diagnosis not present

## 2018-02-25 DIAGNOSIS — R531 Weakness: Secondary | ICD-10-CM | POA: Diagnosis not present

## 2018-02-25 DIAGNOSIS — R630 Anorexia: Secondary | ICD-10-CM | POA: Diagnosis not present

## 2018-02-25 DIAGNOSIS — I4891 Unspecified atrial fibrillation: Secondary | ICD-10-CM | POA: Diagnosis not present

## 2018-02-25 DIAGNOSIS — K219 Gastro-esophageal reflux disease without esophagitis: Secondary | ICD-10-CM | POA: Diagnosis not present

## 2018-02-25 DIAGNOSIS — I5089 Other heart failure: Secondary | ICD-10-CM | POA: Diagnosis not present

## 2018-02-25 DIAGNOSIS — J449 Chronic obstructive pulmonary disease, unspecified: Secondary | ICD-10-CM | POA: Diagnosis not present

## 2018-02-25 DIAGNOSIS — D509 Iron deficiency anemia, unspecified: Secondary | ICD-10-CM | POA: Diagnosis not present

## 2018-02-25 DIAGNOSIS — R1310 Dysphagia, unspecified: Secondary | ICD-10-CM | POA: Diagnosis not present

## 2018-02-25 DIAGNOSIS — I739 Peripheral vascular disease, unspecified: Secondary | ICD-10-CM | POA: Diagnosis not present

## 2018-02-25 DIAGNOSIS — I10 Essential (primary) hypertension: Secondary | ICD-10-CM | POA: Diagnosis not present

## 2018-02-25 DIAGNOSIS — R52 Pain, unspecified: Secondary | ICD-10-CM | POA: Diagnosis not present

## 2018-02-25 DIAGNOSIS — E43 Unspecified severe protein-calorie malnutrition: Secondary | ICD-10-CM | POA: Diagnosis not present

## 2018-03-01 DIAGNOSIS — I7389 Other specified peripheral vascular diseases: Secondary | ICD-10-CM | POA: Diagnosis not present

## 2018-03-01 DIAGNOSIS — I482 Chronic atrial fibrillation: Secondary | ICD-10-CM | POA: Diagnosis not present

## 2018-03-01 DIAGNOSIS — I10 Essential (primary) hypertension: Secondary | ICD-10-CM | POA: Diagnosis not present

## 2018-03-02 DIAGNOSIS — I4891 Unspecified atrial fibrillation: Secondary | ICD-10-CM | POA: Diagnosis not present

## 2018-03-02 DIAGNOSIS — I739 Peripheral vascular disease, unspecified: Secondary | ICD-10-CM | POA: Diagnosis not present

## 2018-03-02 DIAGNOSIS — E43 Unspecified severe protein-calorie malnutrition: Secondary | ICD-10-CM | POA: Diagnosis not present

## 2018-03-02 DIAGNOSIS — D509 Iron deficiency anemia, unspecified: Secondary | ICD-10-CM | POA: Diagnosis not present

## 2018-03-02 DIAGNOSIS — I10 Essential (primary) hypertension: Secondary | ICD-10-CM | POA: Diagnosis not present

## 2018-03-02 DIAGNOSIS — I5089 Other heart failure: Secondary | ICD-10-CM | POA: Diagnosis not present

## 2018-03-03 DIAGNOSIS — E43 Unspecified severe protein-calorie malnutrition: Secondary | ICD-10-CM | POA: Diagnosis not present

## 2018-03-03 DIAGNOSIS — D509 Iron deficiency anemia, unspecified: Secondary | ICD-10-CM | POA: Diagnosis not present

## 2018-03-03 DIAGNOSIS — I5089 Other heart failure: Secondary | ICD-10-CM | POA: Diagnosis not present

## 2018-03-03 DIAGNOSIS — I10 Essential (primary) hypertension: Secondary | ICD-10-CM | POA: Diagnosis not present

## 2018-03-03 DIAGNOSIS — I739 Peripheral vascular disease, unspecified: Secondary | ICD-10-CM | POA: Diagnosis not present

## 2018-03-03 DIAGNOSIS — R2689 Other abnormalities of gait and mobility: Secondary | ICD-10-CM | POA: Diagnosis not present

## 2018-03-03 DIAGNOSIS — M6281 Muscle weakness (generalized): Secondary | ICD-10-CM | POA: Diagnosis not present

## 2018-03-03 DIAGNOSIS — I4891 Unspecified atrial fibrillation: Secondary | ICD-10-CM | POA: Diagnosis not present

## 2018-03-05 DIAGNOSIS — I739 Peripheral vascular disease, unspecified: Secondary | ICD-10-CM | POA: Diagnosis not present

## 2018-03-05 DIAGNOSIS — I4891 Unspecified atrial fibrillation: Secondary | ICD-10-CM | POA: Diagnosis not present

## 2018-03-05 DIAGNOSIS — I5089 Other heart failure: Secondary | ICD-10-CM | POA: Diagnosis not present

## 2018-03-05 DIAGNOSIS — E43 Unspecified severe protein-calorie malnutrition: Secondary | ICD-10-CM | POA: Diagnosis not present

## 2018-03-05 DIAGNOSIS — D509 Iron deficiency anemia, unspecified: Secondary | ICD-10-CM | POA: Diagnosis not present

## 2018-03-05 DIAGNOSIS — M6281 Muscle weakness (generalized): Secondary | ICD-10-CM | POA: Diagnosis not present

## 2018-03-05 DIAGNOSIS — R2689 Other abnormalities of gait and mobility: Secondary | ICD-10-CM | POA: Diagnosis not present

## 2018-03-05 DIAGNOSIS — I10 Essential (primary) hypertension: Secondary | ICD-10-CM | POA: Diagnosis not present

## 2018-03-06 DIAGNOSIS — M6281 Muscle weakness (generalized): Secondary | ICD-10-CM | POA: Diagnosis not present

## 2018-03-06 DIAGNOSIS — R2689 Other abnormalities of gait and mobility: Secondary | ICD-10-CM | POA: Diagnosis not present

## 2018-03-08 DIAGNOSIS — R2689 Other abnormalities of gait and mobility: Secondary | ICD-10-CM | POA: Diagnosis not present

## 2018-03-08 DIAGNOSIS — M6281 Muscle weakness (generalized): Secondary | ICD-10-CM | POA: Diagnosis not present

## 2018-03-09 DIAGNOSIS — I739 Peripheral vascular disease, unspecified: Secondary | ICD-10-CM | POA: Diagnosis not present

## 2018-03-09 DIAGNOSIS — M6281 Muscle weakness (generalized): Secondary | ICD-10-CM | POA: Diagnosis not present

## 2018-03-09 DIAGNOSIS — I4891 Unspecified atrial fibrillation: Secondary | ICD-10-CM | POA: Diagnosis not present

## 2018-03-09 DIAGNOSIS — E43 Unspecified severe protein-calorie malnutrition: Secondary | ICD-10-CM | POA: Diagnosis not present

## 2018-03-09 DIAGNOSIS — I5089 Other heart failure: Secondary | ICD-10-CM | POA: Diagnosis not present

## 2018-03-09 DIAGNOSIS — D509 Iron deficiency anemia, unspecified: Secondary | ICD-10-CM | POA: Diagnosis not present

## 2018-03-09 DIAGNOSIS — R2689 Other abnormalities of gait and mobility: Secondary | ICD-10-CM | POA: Diagnosis not present

## 2018-03-09 DIAGNOSIS — I10 Essential (primary) hypertension: Secondary | ICD-10-CM | POA: Diagnosis not present

## 2018-03-12 DIAGNOSIS — R2689 Other abnormalities of gait and mobility: Secondary | ICD-10-CM | POA: Diagnosis not present

## 2018-03-12 DIAGNOSIS — I10 Essential (primary) hypertension: Secondary | ICD-10-CM | POA: Diagnosis not present

## 2018-03-12 DIAGNOSIS — I4891 Unspecified atrial fibrillation: Secondary | ICD-10-CM | POA: Diagnosis not present

## 2018-03-12 DIAGNOSIS — D509 Iron deficiency anemia, unspecified: Secondary | ICD-10-CM | POA: Diagnosis not present

## 2018-03-12 DIAGNOSIS — E43 Unspecified severe protein-calorie malnutrition: Secondary | ICD-10-CM | POA: Diagnosis not present

## 2018-03-12 DIAGNOSIS — I5089 Other heart failure: Secondary | ICD-10-CM | POA: Diagnosis not present

## 2018-03-12 DIAGNOSIS — I739 Peripheral vascular disease, unspecified: Secondary | ICD-10-CM | POA: Diagnosis not present

## 2018-03-12 DIAGNOSIS — M6281 Muscle weakness (generalized): Secondary | ICD-10-CM | POA: Diagnosis not present

## 2018-03-13 DIAGNOSIS — I739 Peripheral vascular disease, unspecified: Secondary | ICD-10-CM | POA: Diagnosis not present

## 2018-03-13 DIAGNOSIS — D509 Iron deficiency anemia, unspecified: Secondary | ICD-10-CM | POA: Diagnosis not present

## 2018-03-13 DIAGNOSIS — M6281 Muscle weakness (generalized): Secondary | ICD-10-CM | POA: Diagnosis not present

## 2018-03-13 DIAGNOSIS — E43 Unspecified severe protein-calorie malnutrition: Secondary | ICD-10-CM | POA: Diagnosis not present

## 2018-03-13 DIAGNOSIS — I5089 Other heart failure: Secondary | ICD-10-CM | POA: Diagnosis not present

## 2018-03-13 DIAGNOSIS — R2689 Other abnormalities of gait and mobility: Secondary | ICD-10-CM | POA: Diagnosis not present

## 2018-03-13 DIAGNOSIS — I4891 Unspecified atrial fibrillation: Secondary | ICD-10-CM | POA: Diagnosis not present

## 2018-03-13 DIAGNOSIS — I10 Essential (primary) hypertension: Secondary | ICD-10-CM | POA: Diagnosis not present

## 2018-03-14 DIAGNOSIS — M6281 Muscle weakness (generalized): Secondary | ICD-10-CM | POA: Diagnosis not present

## 2018-03-14 DIAGNOSIS — R2689 Other abnormalities of gait and mobility: Secondary | ICD-10-CM | POA: Diagnosis not present

## 2018-03-15 DIAGNOSIS — R2689 Other abnormalities of gait and mobility: Secondary | ICD-10-CM | POA: Diagnosis not present

## 2018-03-15 DIAGNOSIS — M6281 Muscle weakness (generalized): Secondary | ICD-10-CM | POA: Diagnosis not present

## 2018-03-16 DIAGNOSIS — E43 Unspecified severe protein-calorie malnutrition: Secondary | ICD-10-CM | POA: Diagnosis not present

## 2018-03-16 DIAGNOSIS — I5089 Other heart failure: Secondary | ICD-10-CM | POA: Diagnosis not present

## 2018-03-16 DIAGNOSIS — I10 Essential (primary) hypertension: Secondary | ICD-10-CM | POA: Diagnosis not present

## 2018-03-16 DIAGNOSIS — I4891 Unspecified atrial fibrillation: Secondary | ICD-10-CM | POA: Diagnosis not present

## 2018-03-16 DIAGNOSIS — D509 Iron deficiency anemia, unspecified: Secondary | ICD-10-CM | POA: Diagnosis not present

## 2018-03-16 DIAGNOSIS — M6281 Muscle weakness (generalized): Secondary | ICD-10-CM | POA: Diagnosis not present

## 2018-03-16 DIAGNOSIS — I739 Peripheral vascular disease, unspecified: Secondary | ICD-10-CM | POA: Diagnosis not present

## 2018-03-16 DIAGNOSIS — R2689 Other abnormalities of gait and mobility: Secondary | ICD-10-CM | POA: Diagnosis not present

## 2018-03-19 DIAGNOSIS — M6281 Muscle weakness (generalized): Secondary | ICD-10-CM | POA: Diagnosis not present

## 2018-03-19 DIAGNOSIS — E43 Unspecified severe protein-calorie malnutrition: Secondary | ICD-10-CM | POA: Diagnosis not present

## 2018-03-19 DIAGNOSIS — I10 Essential (primary) hypertension: Secondary | ICD-10-CM | POA: Diagnosis not present

## 2018-03-19 DIAGNOSIS — I4891 Unspecified atrial fibrillation: Secondary | ICD-10-CM | POA: Diagnosis not present

## 2018-03-19 DIAGNOSIS — D509 Iron deficiency anemia, unspecified: Secondary | ICD-10-CM | POA: Diagnosis not present

## 2018-03-19 DIAGNOSIS — R2689 Other abnormalities of gait and mobility: Secondary | ICD-10-CM | POA: Diagnosis not present

## 2018-03-19 DIAGNOSIS — I739 Peripheral vascular disease, unspecified: Secondary | ICD-10-CM | POA: Diagnosis not present

## 2018-03-19 DIAGNOSIS — I5089 Other heart failure: Secondary | ICD-10-CM | POA: Diagnosis not present

## 2018-03-20 DIAGNOSIS — R2689 Other abnormalities of gait and mobility: Secondary | ICD-10-CM | POA: Diagnosis not present

## 2018-03-20 DIAGNOSIS — M6281 Muscle weakness (generalized): Secondary | ICD-10-CM | POA: Diagnosis not present

## 2018-03-21 DIAGNOSIS — M6281 Muscle weakness (generalized): Secondary | ICD-10-CM | POA: Diagnosis not present

## 2018-03-21 DIAGNOSIS — R2689 Other abnormalities of gait and mobility: Secondary | ICD-10-CM | POA: Diagnosis not present

## 2018-03-22 DIAGNOSIS — E43 Unspecified severe protein-calorie malnutrition: Secondary | ICD-10-CM | POA: Diagnosis not present

## 2018-03-22 DIAGNOSIS — I10 Essential (primary) hypertension: Secondary | ICD-10-CM | POA: Diagnosis not present

## 2018-03-22 DIAGNOSIS — M6281 Muscle weakness (generalized): Secondary | ICD-10-CM | POA: Diagnosis not present

## 2018-03-22 DIAGNOSIS — I5089 Other heart failure: Secondary | ICD-10-CM | POA: Diagnosis not present

## 2018-03-22 DIAGNOSIS — R2689 Other abnormalities of gait and mobility: Secondary | ICD-10-CM | POA: Diagnosis not present

## 2018-03-22 DIAGNOSIS — D509 Iron deficiency anemia, unspecified: Secondary | ICD-10-CM | POA: Diagnosis not present

## 2018-03-22 DIAGNOSIS — I739 Peripheral vascular disease, unspecified: Secondary | ICD-10-CM | POA: Diagnosis not present

## 2018-03-22 DIAGNOSIS — I4891 Unspecified atrial fibrillation: Secondary | ICD-10-CM | POA: Diagnosis not present

## 2018-03-23 DIAGNOSIS — I739 Peripheral vascular disease, unspecified: Secondary | ICD-10-CM | POA: Diagnosis not present

## 2018-03-23 DIAGNOSIS — R2689 Other abnormalities of gait and mobility: Secondary | ICD-10-CM | POA: Diagnosis not present

## 2018-03-23 DIAGNOSIS — M6281 Muscle weakness (generalized): Secondary | ICD-10-CM | POA: Diagnosis not present

## 2018-03-23 DIAGNOSIS — E43 Unspecified severe protein-calorie malnutrition: Secondary | ICD-10-CM | POA: Diagnosis not present

## 2018-03-23 DIAGNOSIS — I4891 Unspecified atrial fibrillation: Secondary | ICD-10-CM | POA: Diagnosis not present

## 2018-03-23 DIAGNOSIS — D509 Iron deficiency anemia, unspecified: Secondary | ICD-10-CM | POA: Diagnosis not present

## 2018-03-23 DIAGNOSIS — I5089 Other heart failure: Secondary | ICD-10-CM | POA: Diagnosis not present

## 2018-03-23 DIAGNOSIS — I10 Essential (primary) hypertension: Secondary | ICD-10-CM | POA: Diagnosis not present

## 2018-03-26 DIAGNOSIS — I739 Peripheral vascular disease, unspecified: Secondary | ICD-10-CM | POA: Diagnosis not present

## 2018-03-26 DIAGNOSIS — E43 Unspecified severe protein-calorie malnutrition: Secondary | ICD-10-CM | POA: Diagnosis not present

## 2018-03-26 DIAGNOSIS — D509 Iron deficiency anemia, unspecified: Secondary | ICD-10-CM | POA: Diagnosis not present

## 2018-03-26 DIAGNOSIS — I10 Essential (primary) hypertension: Secondary | ICD-10-CM | POA: Diagnosis not present

## 2018-03-26 DIAGNOSIS — M6281 Muscle weakness (generalized): Secondary | ICD-10-CM | POA: Diagnosis not present

## 2018-03-26 DIAGNOSIS — I5089 Other heart failure: Secondary | ICD-10-CM | POA: Diagnosis not present

## 2018-03-26 DIAGNOSIS — I4891 Unspecified atrial fibrillation: Secondary | ICD-10-CM | POA: Diagnosis not present

## 2018-03-26 DIAGNOSIS — R2689 Other abnormalities of gait and mobility: Secondary | ICD-10-CM | POA: Diagnosis not present

## 2018-03-27 DIAGNOSIS — I4891 Unspecified atrial fibrillation: Secondary | ICD-10-CM | POA: Diagnosis not present

## 2018-03-27 DIAGNOSIS — I739 Peripheral vascular disease, unspecified: Secondary | ICD-10-CM | POA: Diagnosis not present

## 2018-03-27 DIAGNOSIS — R2689 Other abnormalities of gait and mobility: Secondary | ICD-10-CM | POA: Diagnosis not present

## 2018-03-27 DIAGNOSIS — D509 Iron deficiency anemia, unspecified: Secondary | ICD-10-CM | POA: Diagnosis not present

## 2018-03-27 DIAGNOSIS — Z23 Encounter for immunization: Secondary | ICD-10-CM | POA: Diagnosis not present

## 2018-03-27 DIAGNOSIS — M6281 Muscle weakness (generalized): Secondary | ICD-10-CM | POA: Diagnosis not present

## 2018-03-27 DIAGNOSIS — E43 Unspecified severe protein-calorie malnutrition: Secondary | ICD-10-CM | POA: Diagnosis not present

## 2018-03-27 DIAGNOSIS — I10 Essential (primary) hypertension: Secondary | ICD-10-CM | POA: Diagnosis not present

## 2018-03-27 DIAGNOSIS — R1310 Dysphagia, unspecified: Secondary | ICD-10-CM | POA: Diagnosis not present

## 2018-03-27 DIAGNOSIS — I5089 Other heart failure: Secondary | ICD-10-CM | POA: Diagnosis not present

## 2018-03-27 DIAGNOSIS — R630 Anorexia: Secondary | ICD-10-CM | POA: Diagnosis not present

## 2018-03-27 DIAGNOSIS — R52 Pain, unspecified: Secondary | ICD-10-CM | POA: Diagnosis not present

## 2018-03-27 DIAGNOSIS — J449 Chronic obstructive pulmonary disease, unspecified: Secondary | ICD-10-CM | POA: Diagnosis not present

## 2018-03-27 DIAGNOSIS — R531 Weakness: Secondary | ICD-10-CM | POA: Diagnosis not present

## 2018-03-27 DIAGNOSIS — K219 Gastro-esophageal reflux disease without esophagitis: Secondary | ICD-10-CM | POA: Diagnosis not present

## 2018-03-28 DIAGNOSIS — R2689 Other abnormalities of gait and mobility: Secondary | ICD-10-CM | POA: Diagnosis not present

## 2018-03-28 DIAGNOSIS — M6281 Muscle weakness (generalized): Secondary | ICD-10-CM | POA: Diagnosis not present

## 2018-03-29 DIAGNOSIS — R2689 Other abnormalities of gait and mobility: Secondary | ICD-10-CM | POA: Diagnosis not present

## 2018-03-29 DIAGNOSIS — M6281 Muscle weakness (generalized): Secondary | ICD-10-CM | POA: Diagnosis not present

## 2018-03-30 DIAGNOSIS — D509 Iron deficiency anemia, unspecified: Secondary | ICD-10-CM | POA: Diagnosis not present

## 2018-03-30 DIAGNOSIS — I739 Peripheral vascular disease, unspecified: Secondary | ICD-10-CM | POA: Diagnosis not present

## 2018-03-30 DIAGNOSIS — R2689 Other abnormalities of gait and mobility: Secondary | ICD-10-CM | POA: Diagnosis not present

## 2018-03-30 DIAGNOSIS — M6281 Muscle weakness (generalized): Secondary | ICD-10-CM | POA: Diagnosis not present

## 2018-03-30 DIAGNOSIS — I10 Essential (primary) hypertension: Secondary | ICD-10-CM | POA: Diagnosis not present

## 2018-03-30 DIAGNOSIS — E43 Unspecified severe protein-calorie malnutrition: Secondary | ICD-10-CM | POA: Diagnosis not present

## 2018-03-30 DIAGNOSIS — I4891 Unspecified atrial fibrillation: Secondary | ICD-10-CM | POA: Diagnosis not present

## 2018-03-30 DIAGNOSIS — I5089 Other heart failure: Secondary | ICD-10-CM | POA: Diagnosis not present

## 2018-04-02 DIAGNOSIS — I5089 Other heart failure: Secondary | ICD-10-CM | POA: Diagnosis not present

## 2018-04-02 DIAGNOSIS — R2689 Other abnormalities of gait and mobility: Secondary | ICD-10-CM | POA: Diagnosis not present

## 2018-04-02 DIAGNOSIS — I4891 Unspecified atrial fibrillation: Secondary | ICD-10-CM | POA: Diagnosis not present

## 2018-04-02 DIAGNOSIS — M6281 Muscle weakness (generalized): Secondary | ICD-10-CM | POA: Diagnosis not present

## 2018-04-02 DIAGNOSIS — I10 Essential (primary) hypertension: Secondary | ICD-10-CM | POA: Diagnosis not present

## 2018-04-02 DIAGNOSIS — D509 Iron deficiency anemia, unspecified: Secondary | ICD-10-CM | POA: Diagnosis not present

## 2018-04-02 DIAGNOSIS — I739 Peripheral vascular disease, unspecified: Secondary | ICD-10-CM | POA: Diagnosis not present

## 2018-04-02 DIAGNOSIS — E43 Unspecified severe protein-calorie malnutrition: Secondary | ICD-10-CM | POA: Diagnosis not present

## 2018-04-03 DIAGNOSIS — E43 Unspecified severe protein-calorie malnutrition: Secondary | ICD-10-CM | POA: Diagnosis not present

## 2018-04-03 DIAGNOSIS — R2689 Other abnormalities of gait and mobility: Secondary | ICD-10-CM | POA: Diagnosis not present

## 2018-04-03 DIAGNOSIS — I739 Peripheral vascular disease, unspecified: Secondary | ICD-10-CM | POA: Diagnosis not present

## 2018-04-03 DIAGNOSIS — I4891 Unspecified atrial fibrillation: Secondary | ICD-10-CM | POA: Diagnosis not present

## 2018-04-03 DIAGNOSIS — D509 Iron deficiency anemia, unspecified: Secondary | ICD-10-CM | POA: Diagnosis not present

## 2018-04-03 DIAGNOSIS — I10 Essential (primary) hypertension: Secondary | ICD-10-CM | POA: Diagnosis not present

## 2018-04-03 DIAGNOSIS — I5089 Other heart failure: Secondary | ICD-10-CM | POA: Diagnosis not present

## 2018-04-03 DIAGNOSIS — M6281 Muscle weakness (generalized): Secondary | ICD-10-CM | POA: Diagnosis not present

## 2018-04-04 DIAGNOSIS — M6281 Muscle weakness (generalized): Secondary | ICD-10-CM | POA: Diagnosis not present

## 2018-04-04 DIAGNOSIS — R2689 Other abnormalities of gait and mobility: Secondary | ICD-10-CM | POA: Diagnosis not present

## 2018-04-05 DIAGNOSIS — M6281 Muscle weakness (generalized): Secondary | ICD-10-CM | POA: Diagnosis not present

## 2018-04-05 DIAGNOSIS — R2689 Other abnormalities of gait and mobility: Secondary | ICD-10-CM | POA: Diagnosis not present

## 2018-04-06 DIAGNOSIS — I739 Peripheral vascular disease, unspecified: Secondary | ICD-10-CM | POA: Diagnosis not present

## 2018-04-06 DIAGNOSIS — R2689 Other abnormalities of gait and mobility: Secondary | ICD-10-CM | POA: Diagnosis not present

## 2018-04-06 DIAGNOSIS — E43 Unspecified severe protein-calorie malnutrition: Secondary | ICD-10-CM | POA: Diagnosis not present

## 2018-04-06 DIAGNOSIS — I10 Essential (primary) hypertension: Secondary | ICD-10-CM | POA: Diagnosis not present

## 2018-04-06 DIAGNOSIS — I4891 Unspecified atrial fibrillation: Secondary | ICD-10-CM | POA: Diagnosis not present

## 2018-04-06 DIAGNOSIS — M6281 Muscle weakness (generalized): Secondary | ICD-10-CM | POA: Diagnosis not present

## 2018-04-06 DIAGNOSIS — I5089 Other heart failure: Secondary | ICD-10-CM | POA: Diagnosis not present

## 2018-04-06 DIAGNOSIS — D509 Iron deficiency anemia, unspecified: Secondary | ICD-10-CM | POA: Diagnosis not present

## 2018-04-07 DIAGNOSIS — M6281 Muscle weakness (generalized): Secondary | ICD-10-CM | POA: Diagnosis not present

## 2018-04-07 DIAGNOSIS — R2689 Other abnormalities of gait and mobility: Secondary | ICD-10-CM | POA: Diagnosis not present

## 2018-04-09 DIAGNOSIS — I739 Peripheral vascular disease, unspecified: Secondary | ICD-10-CM | POA: Diagnosis not present

## 2018-04-09 DIAGNOSIS — D509 Iron deficiency anemia, unspecified: Secondary | ICD-10-CM | POA: Diagnosis not present

## 2018-04-09 DIAGNOSIS — E43 Unspecified severe protein-calorie malnutrition: Secondary | ICD-10-CM | POA: Diagnosis not present

## 2018-04-09 DIAGNOSIS — I5089 Other heart failure: Secondary | ICD-10-CM | POA: Diagnosis not present

## 2018-04-09 DIAGNOSIS — I4891 Unspecified atrial fibrillation: Secondary | ICD-10-CM | POA: Diagnosis not present

## 2018-04-09 DIAGNOSIS — I10 Essential (primary) hypertension: Secondary | ICD-10-CM | POA: Diagnosis not present

## 2018-04-10 DIAGNOSIS — R2689 Other abnormalities of gait and mobility: Secondary | ICD-10-CM | POA: Diagnosis not present

## 2018-04-10 DIAGNOSIS — M6281 Muscle weakness (generalized): Secondary | ICD-10-CM | POA: Diagnosis not present

## 2018-04-11 DIAGNOSIS — R2689 Other abnormalities of gait and mobility: Secondary | ICD-10-CM | POA: Diagnosis not present

## 2018-04-11 DIAGNOSIS — M6281 Muscle weakness (generalized): Secondary | ICD-10-CM | POA: Diagnosis not present

## 2018-04-12 DIAGNOSIS — I10 Essential (primary) hypertension: Secondary | ICD-10-CM | POA: Diagnosis not present

## 2018-04-12 DIAGNOSIS — R2689 Other abnormalities of gait and mobility: Secondary | ICD-10-CM | POA: Diagnosis not present

## 2018-04-12 DIAGNOSIS — I7389 Other specified peripheral vascular diseases: Secondary | ICD-10-CM | POA: Diagnosis not present

## 2018-04-12 DIAGNOSIS — M6281 Muscle weakness (generalized): Secondary | ICD-10-CM | POA: Diagnosis not present

## 2018-04-12 DIAGNOSIS — I482 Chronic atrial fibrillation, unspecified: Secondary | ICD-10-CM | POA: Diagnosis not present

## 2018-04-13 DIAGNOSIS — I739 Peripheral vascular disease, unspecified: Secondary | ICD-10-CM | POA: Diagnosis not present

## 2018-04-13 DIAGNOSIS — I4891 Unspecified atrial fibrillation: Secondary | ICD-10-CM | POA: Diagnosis not present

## 2018-04-13 DIAGNOSIS — M6281 Muscle weakness (generalized): Secondary | ICD-10-CM | POA: Diagnosis not present

## 2018-04-13 DIAGNOSIS — R2689 Other abnormalities of gait and mobility: Secondary | ICD-10-CM | POA: Diagnosis not present

## 2018-04-13 DIAGNOSIS — I10 Essential (primary) hypertension: Secondary | ICD-10-CM | POA: Diagnosis not present

## 2018-04-13 DIAGNOSIS — E43 Unspecified severe protein-calorie malnutrition: Secondary | ICD-10-CM | POA: Diagnosis not present

## 2018-04-13 DIAGNOSIS — D509 Iron deficiency anemia, unspecified: Secondary | ICD-10-CM | POA: Diagnosis not present

## 2018-04-13 DIAGNOSIS — I5089 Other heart failure: Secondary | ICD-10-CM | POA: Diagnosis not present

## 2018-04-16 DIAGNOSIS — D509 Iron deficiency anemia, unspecified: Secondary | ICD-10-CM | POA: Diagnosis not present

## 2018-04-16 DIAGNOSIS — I4891 Unspecified atrial fibrillation: Secondary | ICD-10-CM | POA: Diagnosis not present

## 2018-04-16 DIAGNOSIS — M6281 Muscle weakness (generalized): Secondary | ICD-10-CM | POA: Diagnosis not present

## 2018-04-16 DIAGNOSIS — R2689 Other abnormalities of gait and mobility: Secondary | ICD-10-CM | POA: Diagnosis not present

## 2018-04-16 DIAGNOSIS — I5089 Other heart failure: Secondary | ICD-10-CM | POA: Diagnosis not present

## 2018-04-16 DIAGNOSIS — E43 Unspecified severe protein-calorie malnutrition: Secondary | ICD-10-CM | POA: Diagnosis not present

## 2018-04-16 DIAGNOSIS — I739 Peripheral vascular disease, unspecified: Secondary | ICD-10-CM | POA: Diagnosis not present

## 2018-04-16 DIAGNOSIS — I10 Essential (primary) hypertension: Secondary | ICD-10-CM | POA: Diagnosis not present

## 2018-04-17 DIAGNOSIS — M6281 Muscle weakness (generalized): Secondary | ICD-10-CM | POA: Diagnosis not present

## 2018-04-17 DIAGNOSIS — R2689 Other abnormalities of gait and mobility: Secondary | ICD-10-CM | POA: Diagnosis not present

## 2018-04-18 DIAGNOSIS — M6281 Muscle weakness (generalized): Secondary | ICD-10-CM | POA: Diagnosis not present

## 2018-04-18 DIAGNOSIS — R2689 Other abnormalities of gait and mobility: Secondary | ICD-10-CM | POA: Diagnosis not present

## 2018-04-19 DIAGNOSIS — I739 Peripheral vascular disease, unspecified: Secondary | ICD-10-CM | POA: Diagnosis not present

## 2018-04-19 DIAGNOSIS — E43 Unspecified severe protein-calorie malnutrition: Secondary | ICD-10-CM | POA: Diagnosis not present

## 2018-04-19 DIAGNOSIS — I4891 Unspecified atrial fibrillation: Secondary | ICD-10-CM | POA: Diagnosis not present

## 2018-04-19 DIAGNOSIS — M6281 Muscle weakness (generalized): Secondary | ICD-10-CM | POA: Diagnosis not present

## 2018-04-19 DIAGNOSIS — I5089 Other heart failure: Secondary | ICD-10-CM | POA: Diagnosis not present

## 2018-04-19 DIAGNOSIS — I10 Essential (primary) hypertension: Secondary | ICD-10-CM | POA: Diagnosis not present

## 2018-04-19 DIAGNOSIS — R2689 Other abnormalities of gait and mobility: Secondary | ICD-10-CM | POA: Diagnosis not present

## 2018-04-19 DIAGNOSIS — D509 Iron deficiency anemia, unspecified: Secondary | ICD-10-CM | POA: Diagnosis not present

## 2018-04-20 DIAGNOSIS — I739 Peripheral vascular disease, unspecified: Secondary | ICD-10-CM | POA: Diagnosis not present

## 2018-04-20 DIAGNOSIS — M6281 Muscle weakness (generalized): Secondary | ICD-10-CM | POA: Diagnosis not present

## 2018-04-20 DIAGNOSIS — I5089 Other heart failure: Secondary | ICD-10-CM | POA: Diagnosis not present

## 2018-04-20 DIAGNOSIS — E43 Unspecified severe protein-calorie malnutrition: Secondary | ICD-10-CM | POA: Diagnosis not present

## 2018-04-20 DIAGNOSIS — R2689 Other abnormalities of gait and mobility: Secondary | ICD-10-CM | POA: Diagnosis not present

## 2018-04-20 DIAGNOSIS — I10 Essential (primary) hypertension: Secondary | ICD-10-CM | POA: Diagnosis not present

## 2018-04-20 DIAGNOSIS — I4891 Unspecified atrial fibrillation: Secondary | ICD-10-CM | POA: Diagnosis not present

## 2018-04-20 DIAGNOSIS — D509 Iron deficiency anemia, unspecified: Secondary | ICD-10-CM | POA: Diagnosis not present

## 2018-04-22 DIAGNOSIS — I739 Peripheral vascular disease, unspecified: Secondary | ICD-10-CM | POA: Diagnosis not present

## 2018-04-22 DIAGNOSIS — D509 Iron deficiency anemia, unspecified: Secondary | ICD-10-CM | POA: Diagnosis not present

## 2018-04-22 DIAGNOSIS — I5089 Other heart failure: Secondary | ICD-10-CM | POA: Diagnosis not present

## 2018-04-22 DIAGNOSIS — I10 Essential (primary) hypertension: Secondary | ICD-10-CM | POA: Diagnosis not present

## 2018-04-22 DIAGNOSIS — E43 Unspecified severe protein-calorie malnutrition: Secondary | ICD-10-CM | POA: Diagnosis not present

## 2018-04-22 DIAGNOSIS — I4891 Unspecified atrial fibrillation: Secondary | ICD-10-CM | POA: Diagnosis not present

## 2018-04-23 DIAGNOSIS — I5089 Other heart failure: Secondary | ICD-10-CM | POA: Diagnosis not present

## 2018-04-23 DIAGNOSIS — I4891 Unspecified atrial fibrillation: Secondary | ICD-10-CM | POA: Diagnosis not present

## 2018-04-23 DIAGNOSIS — I10 Essential (primary) hypertension: Secondary | ICD-10-CM | POA: Diagnosis not present

## 2018-04-23 DIAGNOSIS — M6281 Muscle weakness (generalized): Secondary | ICD-10-CM | POA: Diagnosis not present

## 2018-04-23 DIAGNOSIS — R2689 Other abnormalities of gait and mobility: Secondary | ICD-10-CM | POA: Diagnosis not present

## 2018-04-23 DIAGNOSIS — I739 Peripheral vascular disease, unspecified: Secondary | ICD-10-CM | POA: Diagnosis not present

## 2018-04-23 DIAGNOSIS — E43 Unspecified severe protein-calorie malnutrition: Secondary | ICD-10-CM | POA: Diagnosis not present

## 2018-04-23 DIAGNOSIS — D509 Iron deficiency anemia, unspecified: Secondary | ICD-10-CM | POA: Diagnosis not present

## 2018-04-24 DIAGNOSIS — R2689 Other abnormalities of gait and mobility: Secondary | ICD-10-CM | POA: Diagnosis not present

## 2018-04-24 DIAGNOSIS — M6281 Muscle weakness (generalized): Secondary | ICD-10-CM | POA: Diagnosis not present

## 2018-04-25 DIAGNOSIS — M6281 Muscle weakness (generalized): Secondary | ICD-10-CM | POA: Diagnosis not present

## 2018-04-25 DIAGNOSIS — R2689 Other abnormalities of gait and mobility: Secondary | ICD-10-CM | POA: Diagnosis not present

## 2018-04-26 DIAGNOSIS — R2689 Other abnormalities of gait and mobility: Secondary | ICD-10-CM | POA: Diagnosis not present

## 2018-04-26 DIAGNOSIS — M6281 Muscle weakness (generalized): Secondary | ICD-10-CM | POA: Diagnosis not present

## 2018-04-27 DIAGNOSIS — R531 Weakness: Secondary | ICD-10-CM | POA: Diagnosis not present

## 2018-04-27 DIAGNOSIS — I10 Essential (primary) hypertension: Secondary | ICD-10-CM | POA: Diagnosis not present

## 2018-04-27 DIAGNOSIS — K219 Gastro-esophageal reflux disease without esophagitis: Secondary | ICD-10-CM | POA: Diagnosis not present

## 2018-04-27 DIAGNOSIS — R2689 Other abnormalities of gait and mobility: Secondary | ICD-10-CM | POA: Diagnosis not present

## 2018-04-27 DIAGNOSIS — D509 Iron deficiency anemia, unspecified: Secondary | ICD-10-CM | POA: Diagnosis not present

## 2018-04-27 DIAGNOSIS — R52 Pain, unspecified: Secondary | ICD-10-CM | POA: Diagnosis not present

## 2018-04-27 DIAGNOSIS — I5089 Other heart failure: Secondary | ICD-10-CM | POA: Diagnosis not present

## 2018-04-27 DIAGNOSIS — I739 Peripheral vascular disease, unspecified: Secondary | ICD-10-CM | POA: Diagnosis not present

## 2018-04-27 DIAGNOSIS — E43 Unspecified severe protein-calorie malnutrition: Secondary | ICD-10-CM | POA: Diagnosis not present

## 2018-04-27 DIAGNOSIS — J449 Chronic obstructive pulmonary disease, unspecified: Secondary | ICD-10-CM | POA: Diagnosis not present

## 2018-04-27 DIAGNOSIS — I4891 Unspecified atrial fibrillation: Secondary | ICD-10-CM | POA: Diagnosis not present

## 2018-04-27 DIAGNOSIS — R630 Anorexia: Secondary | ICD-10-CM | POA: Diagnosis not present

## 2018-04-27 DIAGNOSIS — R1310 Dysphagia, unspecified: Secondary | ICD-10-CM | POA: Diagnosis not present

## 2018-04-27 DIAGNOSIS — M6281 Muscle weakness (generalized): Secondary | ICD-10-CM | POA: Diagnosis not present

## 2018-04-30 DIAGNOSIS — I10 Essential (primary) hypertension: Secondary | ICD-10-CM | POA: Diagnosis not present

## 2018-04-30 DIAGNOSIS — D509 Iron deficiency anemia, unspecified: Secondary | ICD-10-CM | POA: Diagnosis not present

## 2018-04-30 DIAGNOSIS — I5089 Other heart failure: Secondary | ICD-10-CM | POA: Diagnosis not present

## 2018-04-30 DIAGNOSIS — E43 Unspecified severe protein-calorie malnutrition: Secondary | ICD-10-CM | POA: Diagnosis not present

## 2018-04-30 DIAGNOSIS — I4891 Unspecified atrial fibrillation: Secondary | ICD-10-CM | POA: Diagnosis not present

## 2018-04-30 DIAGNOSIS — M6281 Muscle weakness (generalized): Secondary | ICD-10-CM | POA: Diagnosis not present

## 2018-04-30 DIAGNOSIS — I739 Peripheral vascular disease, unspecified: Secondary | ICD-10-CM | POA: Diagnosis not present

## 2018-04-30 DIAGNOSIS — R2689 Other abnormalities of gait and mobility: Secondary | ICD-10-CM | POA: Diagnosis not present

## 2018-05-01 DIAGNOSIS — M6281 Muscle weakness (generalized): Secondary | ICD-10-CM | POA: Diagnosis not present

## 2018-05-01 DIAGNOSIS — I739 Peripheral vascular disease, unspecified: Secondary | ICD-10-CM | POA: Diagnosis not present

## 2018-05-01 DIAGNOSIS — I10 Essential (primary) hypertension: Secondary | ICD-10-CM | POA: Diagnosis not present

## 2018-05-01 DIAGNOSIS — R2689 Other abnormalities of gait and mobility: Secondary | ICD-10-CM | POA: Diagnosis not present

## 2018-05-01 DIAGNOSIS — I4891 Unspecified atrial fibrillation: Secondary | ICD-10-CM | POA: Diagnosis not present

## 2018-05-01 DIAGNOSIS — I5089 Other heart failure: Secondary | ICD-10-CM | POA: Diagnosis not present

## 2018-05-01 DIAGNOSIS — D509 Iron deficiency anemia, unspecified: Secondary | ICD-10-CM | POA: Diagnosis not present

## 2018-05-01 DIAGNOSIS — E43 Unspecified severe protein-calorie malnutrition: Secondary | ICD-10-CM | POA: Diagnosis not present

## 2018-05-02 DIAGNOSIS — R2689 Other abnormalities of gait and mobility: Secondary | ICD-10-CM | POA: Diagnosis not present

## 2018-05-02 DIAGNOSIS — M6281 Muscle weakness (generalized): Secondary | ICD-10-CM | POA: Diagnosis not present

## 2018-05-03 DIAGNOSIS — R2689 Other abnormalities of gait and mobility: Secondary | ICD-10-CM | POA: Diagnosis not present

## 2018-05-03 DIAGNOSIS — M6281 Muscle weakness (generalized): Secondary | ICD-10-CM | POA: Diagnosis not present

## 2018-05-04 DIAGNOSIS — I4891 Unspecified atrial fibrillation: Secondary | ICD-10-CM | POA: Diagnosis not present

## 2018-05-04 DIAGNOSIS — R2689 Other abnormalities of gait and mobility: Secondary | ICD-10-CM | POA: Diagnosis not present

## 2018-05-04 DIAGNOSIS — M6281 Muscle weakness (generalized): Secondary | ICD-10-CM | POA: Diagnosis not present

## 2018-05-04 DIAGNOSIS — E43 Unspecified severe protein-calorie malnutrition: Secondary | ICD-10-CM | POA: Diagnosis not present

## 2018-05-04 DIAGNOSIS — D509 Iron deficiency anemia, unspecified: Secondary | ICD-10-CM | POA: Diagnosis not present

## 2018-05-04 DIAGNOSIS — I5089 Other heart failure: Secondary | ICD-10-CM | POA: Diagnosis not present

## 2018-05-04 DIAGNOSIS — I10 Essential (primary) hypertension: Secondary | ICD-10-CM | POA: Diagnosis not present

## 2018-05-04 DIAGNOSIS — I739 Peripheral vascular disease, unspecified: Secondary | ICD-10-CM | POA: Diagnosis not present

## 2018-05-07 DIAGNOSIS — I739 Peripheral vascular disease, unspecified: Secondary | ICD-10-CM | POA: Diagnosis not present

## 2018-05-07 DIAGNOSIS — D509 Iron deficiency anemia, unspecified: Secondary | ICD-10-CM | POA: Diagnosis not present

## 2018-05-07 DIAGNOSIS — I10 Essential (primary) hypertension: Secondary | ICD-10-CM | POA: Diagnosis not present

## 2018-05-07 DIAGNOSIS — R2689 Other abnormalities of gait and mobility: Secondary | ICD-10-CM | POA: Diagnosis not present

## 2018-05-07 DIAGNOSIS — I5089 Other heart failure: Secondary | ICD-10-CM | POA: Diagnosis not present

## 2018-05-07 DIAGNOSIS — M6281 Muscle weakness (generalized): Secondary | ICD-10-CM | POA: Diagnosis not present

## 2018-05-07 DIAGNOSIS — I4891 Unspecified atrial fibrillation: Secondary | ICD-10-CM | POA: Diagnosis not present

## 2018-05-07 DIAGNOSIS — E43 Unspecified severe protein-calorie malnutrition: Secondary | ICD-10-CM | POA: Diagnosis not present

## 2018-05-08 DIAGNOSIS — R2689 Other abnormalities of gait and mobility: Secondary | ICD-10-CM | POA: Diagnosis not present

## 2018-05-08 DIAGNOSIS — M6281 Muscle weakness (generalized): Secondary | ICD-10-CM | POA: Diagnosis not present

## 2018-05-09 DIAGNOSIS — R2689 Other abnormalities of gait and mobility: Secondary | ICD-10-CM | POA: Diagnosis not present

## 2018-05-09 DIAGNOSIS — M6281 Muscle weakness (generalized): Secondary | ICD-10-CM | POA: Diagnosis not present

## 2018-05-10 DIAGNOSIS — R2689 Other abnormalities of gait and mobility: Secondary | ICD-10-CM | POA: Diagnosis not present

## 2018-05-10 DIAGNOSIS — M6281 Muscle weakness (generalized): Secondary | ICD-10-CM | POA: Diagnosis not present

## 2018-05-11 DIAGNOSIS — M6281 Muscle weakness (generalized): Secondary | ICD-10-CM | POA: Diagnosis not present

## 2018-05-11 DIAGNOSIS — I4891 Unspecified atrial fibrillation: Secondary | ICD-10-CM | POA: Diagnosis not present

## 2018-05-11 DIAGNOSIS — I10 Essential (primary) hypertension: Secondary | ICD-10-CM | POA: Diagnosis not present

## 2018-05-11 DIAGNOSIS — D509 Iron deficiency anemia, unspecified: Secondary | ICD-10-CM | POA: Diagnosis not present

## 2018-05-11 DIAGNOSIS — I739 Peripheral vascular disease, unspecified: Secondary | ICD-10-CM | POA: Diagnosis not present

## 2018-05-11 DIAGNOSIS — R2689 Other abnormalities of gait and mobility: Secondary | ICD-10-CM | POA: Diagnosis not present

## 2018-05-11 DIAGNOSIS — E43 Unspecified severe protein-calorie malnutrition: Secondary | ICD-10-CM | POA: Diagnosis not present

## 2018-05-11 DIAGNOSIS — I5089 Other heart failure: Secondary | ICD-10-CM | POA: Diagnosis not present

## 2018-05-14 DIAGNOSIS — I4891 Unspecified atrial fibrillation: Secondary | ICD-10-CM | POA: Diagnosis not present

## 2018-05-14 DIAGNOSIS — M6281 Muscle weakness (generalized): Secondary | ICD-10-CM | POA: Diagnosis not present

## 2018-05-14 DIAGNOSIS — D509 Iron deficiency anemia, unspecified: Secondary | ICD-10-CM | POA: Diagnosis not present

## 2018-05-14 DIAGNOSIS — E43 Unspecified severe protein-calorie malnutrition: Secondary | ICD-10-CM | POA: Diagnosis not present

## 2018-05-14 DIAGNOSIS — R2689 Other abnormalities of gait and mobility: Secondary | ICD-10-CM | POA: Diagnosis not present

## 2018-05-14 DIAGNOSIS — I10 Essential (primary) hypertension: Secondary | ICD-10-CM | POA: Diagnosis not present

## 2018-05-14 DIAGNOSIS — I739 Peripheral vascular disease, unspecified: Secondary | ICD-10-CM | POA: Diagnosis not present

## 2018-05-14 DIAGNOSIS — I5089 Other heart failure: Secondary | ICD-10-CM | POA: Diagnosis not present

## 2018-05-15 DIAGNOSIS — M6281 Muscle weakness (generalized): Secondary | ICD-10-CM | POA: Diagnosis not present

## 2018-05-15 DIAGNOSIS — R2689 Other abnormalities of gait and mobility: Secondary | ICD-10-CM | POA: Diagnosis not present

## 2018-05-16 DIAGNOSIS — M6281 Muscle weakness (generalized): Secondary | ICD-10-CM | POA: Diagnosis not present

## 2018-05-16 DIAGNOSIS — D509 Iron deficiency anemia, unspecified: Secondary | ICD-10-CM | POA: Diagnosis not present

## 2018-05-16 DIAGNOSIS — I4891 Unspecified atrial fibrillation: Secondary | ICD-10-CM | POA: Diagnosis not present

## 2018-05-16 DIAGNOSIS — I5089 Other heart failure: Secondary | ICD-10-CM | POA: Diagnosis not present

## 2018-05-16 DIAGNOSIS — R2689 Other abnormalities of gait and mobility: Secondary | ICD-10-CM | POA: Diagnosis not present

## 2018-05-16 DIAGNOSIS — I10 Essential (primary) hypertension: Secondary | ICD-10-CM | POA: Diagnosis not present

## 2018-05-16 DIAGNOSIS — I739 Peripheral vascular disease, unspecified: Secondary | ICD-10-CM | POA: Diagnosis not present

## 2018-05-16 DIAGNOSIS — E43 Unspecified severe protein-calorie malnutrition: Secondary | ICD-10-CM | POA: Diagnosis not present

## 2018-05-17 DIAGNOSIS — R2689 Other abnormalities of gait and mobility: Secondary | ICD-10-CM | POA: Diagnosis not present

## 2018-05-17 DIAGNOSIS — M6281 Muscle weakness (generalized): Secondary | ICD-10-CM | POA: Diagnosis not present

## 2018-05-18 DIAGNOSIS — R2689 Other abnormalities of gait and mobility: Secondary | ICD-10-CM | POA: Diagnosis not present

## 2018-05-18 DIAGNOSIS — M6281 Muscle weakness (generalized): Secondary | ICD-10-CM | POA: Diagnosis not present

## 2018-05-21 DIAGNOSIS — M6281 Muscle weakness (generalized): Secondary | ICD-10-CM | POA: Diagnosis not present

## 2018-05-21 DIAGNOSIS — R2689 Other abnormalities of gait and mobility: Secondary | ICD-10-CM | POA: Diagnosis not present

## 2018-05-22 DIAGNOSIS — I739 Peripheral vascular disease, unspecified: Secondary | ICD-10-CM | POA: Diagnosis not present

## 2018-05-22 DIAGNOSIS — L853 Xerosis cutis: Secondary | ICD-10-CM | POA: Diagnosis not present

## 2018-05-22 DIAGNOSIS — Z89511 Acquired absence of right leg below knee: Secondary | ICD-10-CM | POA: Diagnosis not present

## 2018-05-22 DIAGNOSIS — B351 Tinea unguium: Secondary | ICD-10-CM | POA: Diagnosis not present

## 2018-05-24 DIAGNOSIS — I4819 Other persistent atrial fibrillation: Secondary | ICD-10-CM | POA: Diagnosis not present

## 2018-05-24 DIAGNOSIS — I739 Peripheral vascular disease, unspecified: Secondary | ICD-10-CM | POA: Diagnosis not present

## 2018-05-24 DIAGNOSIS — I10 Essential (primary) hypertension: Secondary | ICD-10-CM | POA: Diagnosis not present

## 2018-06-02 DIAGNOSIS — L609 Nail disorder, unspecified: Secondary | ICD-10-CM | POA: Diagnosis not present

## 2018-06-04 DIAGNOSIS — W268XXA Contact with other sharp object(s), not elsewhere classified, initial encounter: Secondary | ICD-10-CM | POA: Diagnosis not present

## 2018-06-04 DIAGNOSIS — Z23 Encounter for immunization: Secondary | ICD-10-CM | POA: Diagnosis not present

## 2018-06-04 DIAGNOSIS — Z89511 Acquired absence of right leg below knee: Secondary | ICD-10-CM | POA: Diagnosis not present

## 2018-06-04 DIAGNOSIS — R5381 Other malaise: Secondary | ICD-10-CM | POA: Diagnosis not present

## 2018-06-04 DIAGNOSIS — S81812A Laceration without foreign body, left lower leg, initial encounter: Secondary | ICD-10-CM | POA: Diagnosis not present

## 2018-07-07 DIAGNOSIS — I7389 Other specified peripheral vascular diseases: Secondary | ICD-10-CM | POA: Diagnosis not present

## 2018-07-07 DIAGNOSIS — I10 Essential (primary) hypertension: Secondary | ICD-10-CM | POA: Diagnosis not present

## 2018-07-07 DIAGNOSIS — I482 Chronic atrial fibrillation, unspecified: Secondary | ICD-10-CM | POA: Diagnosis not present

## 2018-07-15 DIAGNOSIS — R609 Edema, unspecified: Secondary | ICD-10-CM | POA: Diagnosis not present

## 2018-07-15 DIAGNOSIS — I509 Heart failure, unspecified: Secondary | ICD-10-CM | POA: Diagnosis not present

## 2018-07-20 DIAGNOSIS — I10 Essential (primary) hypertension: Secondary | ICD-10-CM | POA: Diagnosis not present

## 2018-07-20 DIAGNOSIS — F329 Major depressive disorder, single episode, unspecified: Secondary | ICD-10-CM | POA: Diagnosis present

## 2018-07-20 DIAGNOSIS — I739 Peripheral vascular disease, unspecified: Secondary | ICD-10-CM | POA: Diagnosis not present

## 2018-07-20 DIAGNOSIS — M545 Low back pain: Secondary | ICD-10-CM | POA: Diagnosis present

## 2018-07-20 DIAGNOSIS — R2689 Other abnormalities of gait and mobility: Secondary | ICD-10-CM | POA: Diagnosis not present

## 2018-07-20 DIAGNOSIS — G8929 Other chronic pain: Secondary | ICD-10-CM | POA: Diagnosis present

## 2018-07-20 DIAGNOSIS — R0602 Shortness of breath: Secondary | ICD-10-CM | POA: Diagnosis not present

## 2018-07-20 DIAGNOSIS — Z7401 Bed confinement status: Secondary | ICD-10-CM | POA: Diagnosis not present

## 2018-07-20 DIAGNOSIS — I5031 Acute diastolic (congestive) heart failure: Secondary | ICD-10-CM | POA: Diagnosis not present

## 2018-07-20 DIAGNOSIS — G894 Chronic pain syndrome: Secondary | ICD-10-CM | POA: Diagnosis not present

## 2018-07-20 DIAGNOSIS — Z66 Do not resuscitate: Secondary | ICD-10-CM | POA: Diagnosis not present

## 2018-07-20 DIAGNOSIS — Z89511 Acquired absence of right leg below knee: Secondary | ICD-10-CM | POA: Diagnosis not present

## 2018-07-20 DIAGNOSIS — J441 Chronic obstructive pulmonary disease with (acute) exacerbation: Secondary | ICD-10-CM | POA: Diagnosis not present

## 2018-07-20 DIAGNOSIS — J449 Chronic obstructive pulmonary disease, unspecified: Secondary | ICD-10-CM | POA: Diagnosis not present

## 2018-07-20 DIAGNOSIS — R000 Tachycardia, unspecified: Secondary | ICD-10-CM | POA: Diagnosis not present

## 2018-07-20 DIAGNOSIS — R1310 Dysphagia, unspecified: Secondary | ICD-10-CM | POA: Diagnosis present

## 2018-07-20 DIAGNOSIS — Z86718 Personal history of other venous thrombosis and embolism: Secondary | ICD-10-CM | POA: Diagnosis not present

## 2018-07-20 DIAGNOSIS — Z79899 Other long term (current) drug therapy: Secondary | ICD-10-CM | POA: Diagnosis not present

## 2018-07-20 DIAGNOSIS — R062 Wheezing: Secondary | ICD-10-CM | POA: Diagnosis not present

## 2018-07-20 DIAGNOSIS — Z87891 Personal history of nicotine dependence: Secondary | ICD-10-CM | POA: Diagnosis not present

## 2018-07-20 DIAGNOSIS — I110 Hypertensive heart disease with heart failure: Secondary | ICD-10-CM | POA: Diagnosis not present

## 2018-07-20 DIAGNOSIS — Z7982 Long term (current) use of aspirin: Secondary | ICD-10-CM | POA: Diagnosis not present

## 2018-07-20 DIAGNOSIS — K219 Gastro-esophageal reflux disease without esophagitis: Secondary | ICD-10-CM | POA: Diagnosis present

## 2018-07-20 DIAGNOSIS — R5381 Other malaise: Secondary | ICD-10-CM | POA: Diagnosis not present

## 2018-07-20 DIAGNOSIS — M2550 Pain in unspecified joint: Secondary | ICD-10-CM | POA: Diagnosis not present

## 2018-07-20 DIAGNOSIS — R0989 Other specified symptoms and signs involving the circulatory and respiratory systems: Secondary | ICD-10-CM | POA: Diagnosis not present

## 2018-07-20 DIAGNOSIS — I509 Heart failure, unspecified: Secondary | ICD-10-CM | POA: Diagnosis not present

## 2018-07-20 DIAGNOSIS — I4891 Unspecified atrial fibrillation: Secondary | ICD-10-CM | POA: Diagnosis present

## 2018-07-20 DIAGNOSIS — Z882 Allergy status to sulfonamides status: Secondary | ICD-10-CM | POA: Diagnosis not present

## 2018-07-20 DIAGNOSIS — R0902 Hypoxemia: Secondary | ICD-10-CM | POA: Diagnosis not present

## 2018-07-20 DIAGNOSIS — M6281 Muscle weakness (generalized): Secondary | ICD-10-CM | POA: Diagnosis not present

## 2018-07-23 DIAGNOSIS — M6281 Muscle weakness (generalized): Secondary | ICD-10-CM | POA: Diagnosis not present

## 2018-07-23 DIAGNOSIS — R2689 Other abnormalities of gait and mobility: Secondary | ICD-10-CM | POA: Diagnosis not present

## 2018-07-23 DIAGNOSIS — G894 Chronic pain syndrome: Secondary | ICD-10-CM | POA: Diagnosis not present

## 2018-07-23 DIAGNOSIS — R5381 Other malaise: Secondary | ICD-10-CM | POA: Diagnosis not present

## 2018-07-23 DIAGNOSIS — I739 Peripheral vascular disease, unspecified: Secondary | ICD-10-CM | POA: Diagnosis not present

## 2018-07-23 DIAGNOSIS — M2550 Pain in unspecified joint: Secondary | ICD-10-CM | POA: Diagnosis not present

## 2018-07-23 DIAGNOSIS — I4891 Unspecified atrial fibrillation: Secondary | ICD-10-CM | POA: Diagnosis not present

## 2018-07-23 DIAGNOSIS — J449 Chronic obstructive pulmonary disease, unspecified: Secondary | ICD-10-CM | POA: Diagnosis not present

## 2018-07-23 DIAGNOSIS — Z7401 Bed confinement status: Secondary | ICD-10-CM | POA: Diagnosis not present

## 2018-07-23 DIAGNOSIS — I110 Hypertensive heart disease with heart failure: Secondary | ICD-10-CM | POA: Diagnosis not present

## 2018-07-23 DIAGNOSIS — Z66 Do not resuscitate: Secondary | ICD-10-CM | POA: Diagnosis not present

## 2018-07-23 DIAGNOSIS — J441 Chronic obstructive pulmonary disease with (acute) exacerbation: Secondary | ICD-10-CM | POA: Diagnosis not present

## 2018-07-23 DIAGNOSIS — I5031 Acute diastolic (congestive) heart failure: Secondary | ICD-10-CM | POA: Diagnosis not present

## 2018-07-31 ENCOUNTER — Other Ambulatory Visit: Payer: Self-pay | Admitting: *Deleted

## 2018-07-31 NOTE — Patient Outreach (Signed)
[facility]) Care Management  07/31/2018  Michai Gonyeau Horace May 14, 1933 [id number]  Patient is a LTC resident at [facility], no plans to discharge.  No [facility] Care management needs assessed. Plan to sign off. Alben Spittle. Albertha Ghee, MSN, RN, [facility], CCM  Post Acute Chartered loss adjuster [phone]) Business Cell  [phone]) Toll Free Office

## 2018-08-01 ENCOUNTER — Encounter: Payer: Self-pay | Admitting: *Deleted

## 2018-08-01 ENCOUNTER — Other Ambulatory Visit: Payer: Self-pay | Admitting: *Deleted

## 2018-08-01 ENCOUNTER — Ambulatory Visit (HOSPITAL_COMMUNITY)
Admission: RE | Admit: 2018-08-01 | Discharge: 2018-08-01 | Disposition: A | Discharge status: Home / Self Care | Payer: No Typology Code available for payment source | Source: Ambulatory Visit | Attending: Vascular Surgery | Admitting: Vascular Surgery

## 2018-08-01 ENCOUNTER — Other Ambulatory Visit: Payer: Self-pay

## 2018-08-01 ENCOUNTER — Encounter: Payer: Self-pay | Admitting: Vascular Surgery

## 2018-08-01 ENCOUNTER — Ambulatory Visit (INDEPENDENT_AMBULATORY_CARE_PROVIDER_SITE_OTHER): Payer: Medicare Other | Admitting: Vascular Surgery

## 2018-08-01 VITALS — BP 132/71 | HR 61 | Temp 97.7°F | Resp 18 | Ht 71.0 in | Wt 195.0 lb

## 2018-08-01 DIAGNOSIS — I739 Peripheral vascular disease, unspecified: Secondary | ICD-10-CM | POA: Insufficient documentation

## 2018-08-01 NOTE — Progress Notes (Signed)
Patient name: [name] [name] MRN: [id number] DOB: 17-Mar-1933 Sex: male  REASON FOR VISIT:   Follow-up of peripheral vascular disease  HPI:   [name] is a pleasant 85 y.o. male who I last saw him on 06/14/2017.  He underwent a right below the knee amputation on 04/13/2017.  He also had some wounds on his left leg.  When I last saw him his ABI on the left was 77% with a toe pressure of 32 mmHg.  His wounds were improving.  He comes in for 24-month follow-up visit.  Since I saw him last he had a toenail removed from the left great toe which is been slow to heal.  He has another wound on the third toe.  He is nonambulatory and does not have a prosthesis for his right BKA.  He has not been getting much physical therapy.  He is fairly debilitated.  He does describe rest pain which is worsened when he tries to elevate his leg at all.  He denies fever or chills.  Past Medical History:  Diagnosis Date  . Achalasia   . Alcohol abuse    stop drinking since 01/2009  . Arthritis   . Asthma   . Atrial fibrillation (HCC)    off coumadin secondary to fall and subdural  hematoma   . B12 deficiency 9/10   started injection   . BPH (benign prostatic hyperplasia)   . Carcinoma in situ of bladder 2014   Dr. Annabell Howells  . Chronic kidney disease    RECENT UTI FEW MONTHS AGO TX WITH ANTIBIOTIC  . Compression fracture   . COPD (chronic obstructive pulmonary disease) (HCC)   . Diverticula of colon    few small scattered in the sigmoid colon  . Enlarged prostate   . External hemorrhoids 09/2014  . GERD (gastroesophageal reflux disease)   . H/O hiatal hernia   . Headache(784.0)   . Hypertension   . Nutcracker esophagus 2002   on EM  . Osteoporosis   . Peripheral edema   . Reflux   . Venous stasis    chronic     Family History  Problem Relation Age of Onset  . Cancer Brother   . Hypertension Mother   . Diabetes Mother   . Heart attack Mother   . Heart disease Mother   . Colon cancer Neg Hx   .  Gastric cancer Neg Hx   . Esophageal cancer Neg Hx     SOCIAL HISTORY: Social History   Tobacco Use  . Smoking status: Never Smoker  . Smokeless tobacco: Former Neurosurgeon    Types: Chew  Substance Use Topics  . Alcohol use: No    Alcohol/week: 0.0 standard drinks    Comment: history of alcohol abuse stop drinking 8/10 . Alcohol abuse for about 25 years     Allergies  Allergen Reactions  . Sulfa Antibiotics Other (See Comments)  . Sulfonamide Derivatives Hives  . Tramadol Itching and Other (See Comments)    Current Outpatient Medications  Medication Sig Dispense Refill  . CALCIUM PO Take 1 tablet by mouth daily.    . cholecalciferol (VITAMIN D) 1000 units tablet Take 1,000 Units by mouth daily.    Marland Kitchen diltiazem (CARDIZEM) 60 MG tablet Take 1 tablet (60 mg total) by mouth 3 (three) times daily. 90 tablet 5  . diphenhydrAMINE (BENADRYL) 25 mg capsule Take 25 mg by mouth 2 (two) times daily as needed for itching.    . ferrous  sulfate 325 (65 FE) MG tablet Take 325 mg by mouth daily with breakfast.    . folic acid (FOLVITE) 1 MG tablet Take 1 mg by mouth daily.    . furosemide (LASIX) 40 MG tablet Take 2 tablets every morning (Patient taking differently: Take 40 mg by mouth daily. ) 30 tablet 5  . HYDROcodone-acetaminophen (NORCO/VICODIN) 5-325 MG tablet Take 1 tablet by mouth every 6 (six) hours as needed for moderate pain.    . Melatonin 5 MG TABS Take 1 tablet by mouth daily.    . Multiple Vitamin (MULTIVITAMIN) tablet Take 1 tablet by mouth daily.    . polyethylene glycol (MIRALAX / GLYCOLAX) packet Take 17 g by mouth 2 (two) times daily.    . potassium chloride SA (K-DUR,KLOR-CON) 20 MEQ tablet Take 2 tablets (40 mEq total) by mouth 2 (two) times daily. (Patient taking differently: Take 10 mEq by mouth daily. )    . senna (SENOKOT) 8.6 MG TABS tablet Take 1 tablet (8.6 mg total) by mouth at bedtime.    . vitamin B-12 (CYANOCOBALAMIN) 1000 MCG tablet Take 1,000 mcg by mouth daily.      . vitamin C (ASCORBIC ACID) 500 MG tablet Take 500 mg by mouth daily.    Marland Kitchen. warfarin (COUMADIN) 5 MG tablet TAKE ONE TABLET BY MOUTH ONCE DAILY AT 6PM. 30 tablet 5  . escitalopram (LEXAPRO) 10 MG tablet Take 1 tablet (10 mg total) by mouth daily. 30 tablet 5   No current facility-administered medications for this visit.     REVIEW OF SYSTEMS:  [X]  denotes positive finding, [ ]  denotes negative finding Cardiac  Comments:  Chest pain or chest pressure:    Shortness of breath upon exertion:    Short of breath when lying flat:    Irregular heart rhythm:        Vascular    Pain in calf, thigh, or hip brought on by ambulation:    Pain in feet at night that wakes you up from your sleep:  x   Blood clot in your veins:    Leg swelling:  x       Pulmonary    Oxygen at home:    Productive cough:  x   Wheezing:  x       Neurologic    Sudden weakness in arms or legs:     Sudden numbness in arms or legs:     Sudden onset of difficulty speaking or slurred speech:    Temporary loss of vision in one eye:     Problems with dizziness:         Gastrointestinal    Blood in stool:     Vomited blood:         Genitourinary    Burning when urinating:     Blood in urine:        Psychiatric    Major depression:         Hematologic    Bleeding problems:    Problems with blood clotting too easily:        Skin    Rashes or ulcers: x  left foot      Constitutional    Fever or chills:     PHYSICAL EXAM:   Vitals:   08/01/18 1349  BP: 132/71  Pulse: 61  Resp: 18  Temp: 97.7 F (36.5 C)  TempSrc: Oral  SpO2: 99%  Weight: 195 lb (88.5 kg)  Height: 5\' 11"  (1.803 m)  GENERAL: The patient is a well-nourished male, in no acute distress. The vital signs are documented above. CARDIAC: There is a regular rate and rhythm.  VASCULAR: I do not detect carotid bruits. He has palpable femoral pulses. On the left side I cannot palpate a popliteal pulse or pedal pulses. He has moderate  left lower extremity swelling. PULMONARY: There is good air exchange bilaterally without wheezing or rales. ABDOMEN: Soft and non-tender with normal pitched bowel sounds.  MUSCULOSKELETAL: His right below the knee amputation is healed. NEUROLOGIC: No focal weakness or paresthesias are detected. SKIN: He has some erythema of the toes of the left foot with a wound on the left great toe and the left third toe. PSYCHIATRIC: The patient has a normal affect.  DATA:    ARTERIAL DOPPLER STUDY: I have independently interpreted his arterial Doppler study today.  On the left side there is a dampened monophasic dorsalis pedis signal and dampened monophasic posterior tibial signal.  ABIs 99% although this is clearly falsely elevated because of his calcific disease.  MEDICAL ISSUES:   PERIPHERAL VASCULAR DISEASE WITH ULCERATION LEFT FOOT: Given that he has wounds on the left foot with evidence of severe infrainguinal arterial occlusive disease, this is clearly a limb threatening situation.  He is markedly debilitated and certainly is at high risk for any intervention.  I explained that the options would be to continue to follow wounds and consider primary amputation if they progressed versus considering arteriography to see if there might be anything to do from an endovascular standpoint.  I clearly do not think he is a good candidate for extensive bypass. I have reviewed with the patient the indications for arteriography. In addition, I have reviewed the potential complications of arteriography including but not limited to: Bleeding, arterial injury, arterial thrombosis, dye action, renal insufficiency, or other unpredictable medical problems. I have explained to the patient that if we find disease amenable to angioplasty we could potentially address this at the same time. I have discussed the potential complications of angioplasty and stenting, including but not limited to: Bleeding, arterial thrombosis, arterial  injury, dissection, or the need for surgical intervention.  We will have to stop his Coumadin 4 days prior to the procedure.  After extensive discussion patient and his family members would like to pursue arteriography which has been scheduled for 08/17/2018.  I will make further recommendations pending these results.   Waverly Ferrari [facility] [phone]

## 2018-08-03 DIAGNOSIS — M6281 Muscle weakness (generalized): Secondary | ICD-10-CM | POA: Diagnosis not present

## 2018-08-03 DIAGNOSIS — I5031 Acute diastolic (congestive) heart failure: Secondary | ICD-10-CM | POA: Diagnosis not present

## 2018-08-03 DIAGNOSIS — R2689 Other abnormalities of gait and mobility: Secondary | ICD-10-CM | POA: Diagnosis not present

## 2018-08-06 DIAGNOSIS — I5031 Acute diastolic (congestive) heart failure: Secondary | ICD-10-CM | POA: Diagnosis not present

## 2018-08-06 DIAGNOSIS — R2689 Other abnormalities of gait and mobility: Secondary | ICD-10-CM | POA: Diagnosis not present

## 2018-08-06 DIAGNOSIS — M6281 Muscle weakness (generalized): Secondary | ICD-10-CM | POA: Diagnosis not present

## 2018-08-07 DIAGNOSIS — R2689 Other abnormalities of gait and mobility: Secondary | ICD-10-CM | POA: Diagnosis not present

## 2018-08-07 DIAGNOSIS — M6281 Muscle weakness (generalized): Secondary | ICD-10-CM | POA: Diagnosis not present

## 2018-08-07 DIAGNOSIS — I5031 Acute diastolic (congestive) heart failure: Secondary | ICD-10-CM | POA: Diagnosis not present

## 2018-08-08 DIAGNOSIS — I5031 Acute diastolic (congestive) heart failure: Secondary | ICD-10-CM | POA: Diagnosis not present

## 2018-08-08 DIAGNOSIS — M6281 Muscle weakness (generalized): Secondary | ICD-10-CM | POA: Diagnosis not present

## 2018-08-08 DIAGNOSIS — R2689 Other abnormalities of gait and mobility: Secondary | ICD-10-CM | POA: Diagnosis not present

## 2018-08-09 DIAGNOSIS — R2689 Other abnormalities of gait and mobility: Secondary | ICD-10-CM | POA: Diagnosis not present

## 2018-08-09 DIAGNOSIS — M6281 Muscle weakness (generalized): Secondary | ICD-10-CM | POA: Diagnosis not present

## 2018-08-09 DIAGNOSIS — I5031 Acute diastolic (congestive) heart failure: Secondary | ICD-10-CM | POA: Diagnosis not present

## 2018-08-10 DIAGNOSIS — M6281 Muscle weakness (generalized): Secondary | ICD-10-CM | POA: Diagnosis not present

## 2018-08-10 DIAGNOSIS — I5031 Acute diastolic (congestive) heart failure: Secondary | ICD-10-CM | POA: Diagnosis not present

## 2018-08-10 DIAGNOSIS — R2689 Other abnormalities of gait and mobility: Secondary | ICD-10-CM | POA: Diagnosis not present

## 2018-08-11 DIAGNOSIS — Z66 Do not resuscitate: Secondary | ICD-10-CM | POA: Diagnosis not present

## 2018-08-11 DIAGNOSIS — S81802A Unspecified open wound, left lower leg, initial encounter: Secondary | ICD-10-CM | POA: Diagnosis not present

## 2018-08-11 DIAGNOSIS — L03116 Cellulitis of left lower limb: Secondary | ICD-10-CM | POA: Diagnosis not present

## 2018-08-11 DIAGNOSIS — I5032 Chronic diastolic (congestive) heart failure: Secondary | ICD-10-CM | POA: Diagnosis not present

## 2018-08-11 DIAGNOSIS — M79662 Pain in left lower leg: Secondary | ICD-10-CM | POA: Diagnosis not present

## 2018-08-11 DIAGNOSIS — R0602 Shortness of breath: Secondary | ICD-10-CM | POA: Diagnosis not present

## 2018-08-11 DIAGNOSIS — I739 Peripheral vascular disease, unspecified: Secondary | ICD-10-CM | POA: Diagnosis not present

## 2018-08-11 DIAGNOSIS — I110 Hypertensive heart disease with heart failure: Secondary | ICD-10-CM | POA: Diagnosis not present

## 2018-08-11 DIAGNOSIS — I482 Chronic atrial fibrillation, unspecified: Secondary | ICD-10-CM | POA: Diagnosis not present

## 2018-08-11 DIAGNOSIS — R918 Other nonspecific abnormal finding of lung field: Secondary | ICD-10-CM | POA: Diagnosis not present

## 2018-08-12 DIAGNOSIS — Z87891 Personal history of nicotine dependence: Secondary | ICD-10-CM | POA: Diagnosis not present

## 2018-08-12 DIAGNOSIS — G894 Chronic pain syndrome: Secondary | ICD-10-CM | POA: Diagnosis not present

## 2018-08-12 DIAGNOSIS — M545 Low back pain: Secondary | ICD-10-CM | POA: Diagnosis present

## 2018-08-12 DIAGNOSIS — Z7401 Bed confinement status: Secondary | ICD-10-CM | POA: Diagnosis not present

## 2018-08-12 DIAGNOSIS — I4891 Unspecified atrial fibrillation: Secondary | ICD-10-CM | POA: Diagnosis not present

## 2018-08-12 DIAGNOSIS — Z882 Allergy status to sulfonamides status: Secondary | ICD-10-CM | POA: Diagnosis not present

## 2018-08-12 DIAGNOSIS — L0390 Cellulitis, unspecified: Secondary | ICD-10-CM | POA: Diagnosis not present

## 2018-08-12 DIAGNOSIS — I110 Hypertensive heart disease with heart failure: Secondary | ICD-10-CM | POA: Diagnosis present

## 2018-08-12 DIAGNOSIS — J449 Chronic obstructive pulmonary disease, unspecified: Secondary | ICD-10-CM | POA: Diagnosis present

## 2018-08-12 DIAGNOSIS — Z89511 Acquired absence of right leg below knee: Secondary | ICD-10-CM | POA: Diagnosis not present

## 2018-08-12 DIAGNOSIS — G629 Polyneuropathy, unspecified: Secondary | ICD-10-CM | POA: Diagnosis present

## 2018-08-12 DIAGNOSIS — R52 Pain, unspecified: Secondary | ICD-10-CM | POA: Diagnosis not present

## 2018-08-12 DIAGNOSIS — Z7982 Long term (current) use of aspirin: Secondary | ICD-10-CM | POA: Diagnosis not present

## 2018-08-12 DIAGNOSIS — M79605 Pain in left leg: Secondary | ICD-10-CM | POA: Diagnosis not present

## 2018-08-12 DIAGNOSIS — R1314 Dysphagia, pharyngoesophageal phase: Secondary | ICD-10-CM | POA: Diagnosis not present

## 2018-08-12 DIAGNOSIS — I959 Hypotension, unspecified: Secondary | ICD-10-CM | POA: Diagnosis not present

## 2018-08-12 DIAGNOSIS — M6281 Muscle weakness (generalized): Secondary | ICD-10-CM | POA: Diagnosis not present

## 2018-08-12 DIAGNOSIS — Z86718 Personal history of other venous thrombosis and embolism: Secondary | ICD-10-CM | POA: Diagnosis not present

## 2018-08-12 DIAGNOSIS — Z79899 Other long term (current) drug therapy: Secondary | ICD-10-CM | POA: Diagnosis not present

## 2018-08-12 DIAGNOSIS — F329 Major depressive disorder, single episode, unspecified: Secondary | ICD-10-CM | POA: Diagnosis present

## 2018-08-12 DIAGNOSIS — Z66 Do not resuscitate: Secondary | ICD-10-CM | POA: Diagnosis not present

## 2018-08-12 DIAGNOSIS — G8929 Other chronic pain: Secondary | ICD-10-CM | POA: Diagnosis present

## 2018-08-12 DIAGNOSIS — I482 Chronic atrial fibrillation, unspecified: Secondary | ICD-10-CM | POA: Diagnosis not present

## 2018-08-12 DIAGNOSIS — I739 Peripheral vascular disease, unspecified: Secondary | ICD-10-CM | POA: Diagnosis not present

## 2018-08-12 DIAGNOSIS — I5032 Chronic diastolic (congestive) heart failure: Secondary | ICD-10-CM | POA: Diagnosis present

## 2018-08-12 DIAGNOSIS — R2689 Other abnormalities of gait and mobility: Secondary | ICD-10-CM | POA: Diagnosis not present

## 2018-08-12 DIAGNOSIS — I872 Venous insufficiency (chronic) (peripheral): Secondary | ICD-10-CM | POA: Diagnosis present

## 2018-08-12 DIAGNOSIS — L03116 Cellulitis of left lower limb: Secondary | ICD-10-CM | POA: Diagnosis not present

## 2018-08-12 DIAGNOSIS — K219 Gastro-esophageal reflux disease without esophagitis: Secondary | ICD-10-CM | POA: Diagnosis present

## 2018-08-15 DIAGNOSIS — L03116 Cellulitis of left lower limb: Secondary | ICD-10-CM | POA: Diagnosis present

## 2018-08-15 DIAGNOSIS — I5032 Chronic diastolic (congestive) heart failure: Secondary | ICD-10-CM | POA: Diagnosis present

## 2018-08-15 DIAGNOSIS — N189 Chronic kidney disease, unspecified: Secondary | ICD-10-CM | POA: Diagnosis present

## 2018-08-15 DIAGNOSIS — I872 Venous insufficiency (chronic) (peripheral): Secondary | ICD-10-CM | POA: Diagnosis not present

## 2018-08-15 DIAGNOSIS — Z66 Do not resuscitate: Secondary | ICD-10-CM | POA: Diagnosis not present

## 2018-08-15 DIAGNOSIS — Z87891 Personal history of nicotine dependence: Secondary | ICD-10-CM | POA: Diagnosis not present

## 2018-08-15 DIAGNOSIS — E876 Hypokalemia: Secondary | ICD-10-CM | POA: Diagnosis present

## 2018-08-15 DIAGNOSIS — Z885 Allergy status to narcotic agent status: Secondary | ICD-10-CM | POA: Diagnosis not present

## 2018-08-15 DIAGNOSIS — T50905A Adverse effect of unspecified drugs, medicaments and biological substances, initial encounter: Secondary | ICD-10-CM | POA: Diagnosis not present

## 2018-08-15 DIAGNOSIS — K219 Gastro-esophageal reflux disease without esophagitis: Secondary | ICD-10-CM | POA: Diagnosis present

## 2018-08-15 DIAGNOSIS — Z515 Encounter for palliative care: Secondary | ICD-10-CM | POA: Diagnosis not present

## 2018-08-15 DIAGNOSIS — E43 Unspecified severe protein-calorie malnutrition: Secondary | ICD-10-CM | POA: Diagnosis present

## 2018-08-15 DIAGNOSIS — K224 Dyskinesia of esophagus: Secondary | ICD-10-CM | POA: Diagnosis present

## 2018-08-15 DIAGNOSIS — R1314 Dysphagia, pharyngoesophageal phase: Secondary | ICD-10-CM | POA: Diagnosis not present

## 2018-08-15 DIAGNOSIS — L298 Other pruritus: Secondary | ICD-10-CM | POA: Diagnosis not present

## 2018-08-15 DIAGNOSIS — Z993 Dependence on wheelchair: Secondary | ICD-10-CM | POA: Diagnosis not present

## 2018-08-15 DIAGNOSIS — I130 Hypertensive heart and chronic kidney disease with heart failure and stage 1 through stage 4 chronic kidney disease, or unspecified chronic kidney disease: Secondary | ICD-10-CM | POA: Diagnosis present

## 2018-08-15 DIAGNOSIS — I959 Hypotension, unspecified: Secondary | ICD-10-CM | POA: Diagnosis not present

## 2018-08-15 DIAGNOSIS — R2689 Other abnormalities of gait and mobility: Secondary | ICD-10-CM | POA: Diagnosis not present

## 2018-08-15 DIAGNOSIS — L02416 Cutaneous abscess of left lower limb: Secondary | ICD-10-CM | POA: Diagnosis present

## 2018-08-15 DIAGNOSIS — R627 Adult failure to thrive: Secondary | ICD-10-CM | POA: Diagnosis present

## 2018-08-15 DIAGNOSIS — Z6827 Body mass index (BMI) 27.0-27.9, adult: Secondary | ICD-10-CM | POA: Diagnosis not present

## 2018-08-15 DIAGNOSIS — I4891 Unspecified atrial fibrillation: Secondary | ICD-10-CM | POA: Diagnosis not present

## 2018-08-15 DIAGNOSIS — R52 Pain, unspecified: Secondary | ICD-10-CM | POA: Diagnosis not present

## 2018-08-15 DIAGNOSIS — Z7189 Other specified counseling: Secondary | ICD-10-CM | POA: Diagnosis not present

## 2018-08-15 DIAGNOSIS — M810 Age-related osteoporosis without current pathological fracture: Secondary | ICD-10-CM | POA: Diagnosis present

## 2018-08-15 DIAGNOSIS — G894 Chronic pain syndrome: Secondary | ICD-10-CM | POA: Diagnosis present

## 2018-08-15 DIAGNOSIS — J449 Chronic obstructive pulmonary disease, unspecified: Secondary | ICD-10-CM | POA: Diagnosis present

## 2018-08-15 DIAGNOSIS — I998 Other disorder of circulatory system: Secondary | ICD-10-CM | POA: Diagnosis present

## 2018-08-15 DIAGNOSIS — R54 Age-related physical debility: Secondary | ICD-10-CM | POA: Diagnosis present

## 2018-08-15 DIAGNOSIS — N400 Enlarged prostate without lower urinary tract symptoms: Secondary | ICD-10-CM | POA: Diagnosis present

## 2018-08-15 DIAGNOSIS — Z7401 Bed confinement status: Secondary | ICD-10-CM | POA: Diagnosis not present

## 2018-08-15 DIAGNOSIS — I482 Chronic atrial fibrillation, unspecified: Secondary | ICD-10-CM | POA: Diagnosis present

## 2018-08-15 DIAGNOSIS — I739 Peripheral vascular disease, unspecified: Secondary | ICD-10-CM | POA: Diagnosis present

## 2018-08-15 DIAGNOSIS — I878 Other specified disorders of veins: Secondary | ICD-10-CM | POA: Diagnosis present

## 2018-08-15 DIAGNOSIS — M79605 Pain in left leg: Secondary | ICD-10-CM | POA: Diagnosis not present

## 2018-08-15 DIAGNOSIS — M6281 Muscle weakness (generalized): Secondary | ICD-10-CM | POA: Diagnosis not present

## 2018-08-15 DIAGNOSIS — I480 Paroxysmal atrial fibrillation: Secondary | ICD-10-CM | POA: Diagnosis present

## 2018-08-15 DIAGNOSIS — L0390 Cellulitis, unspecified: Secondary | ICD-10-CM | POA: Diagnosis not present

## 2018-08-15 DIAGNOSIS — I110 Hypertensive heart disease with heart failure: Secondary | ICD-10-CM | POA: Diagnosis not present

## 2018-08-17 ENCOUNTER — Encounter (HOSPITAL_COMMUNITY)
Admission: RE | Disposition: A | Discharge status: Skilled Nursing Facility | Payer: Self-pay | Source: Skilled Nursing Facility | Attending: Internal Medicine

## 2018-08-17 ENCOUNTER — Other Ambulatory Visit: Payer: Self-pay

## 2018-08-17 ENCOUNTER — Encounter (HOSPITAL_COMMUNITY): Payer: Self-pay | Admitting: Internal Medicine

## 2018-08-17 ENCOUNTER — Inpatient Hospital Stay (HOSPITAL_COMMUNITY)
Admission: RE | Admit: 2018-08-17 | Discharge: 2018-08-20 | DRG: 299 | Disposition: A | Discharge status: Skilled Nursing Facility | Payer: Medicare Other | Source: Skilled Nursing Facility | Attending: Internal Medicine | Admitting: Internal Medicine

## 2018-08-17 DIAGNOSIS — L298 Other pruritus: Secondary | ICD-10-CM | POA: Diagnosis not present

## 2018-08-17 DIAGNOSIS — T50905A Adverse effect of unspecified drugs, medicaments and biological substances, initial encounter: Secondary | ICD-10-CM | POA: Diagnosis not present

## 2018-08-17 DIAGNOSIS — E43 Unspecified severe protein-calorie malnutrition: Secondary | ICD-10-CM | POA: Diagnosis present

## 2018-08-17 DIAGNOSIS — Z7901 Long term (current) use of anticoagulants: Secondary | ICD-10-CM

## 2018-08-17 DIAGNOSIS — Z515 Encounter for palliative care: Secondary | ICD-10-CM | POA: Diagnosis not present

## 2018-08-17 DIAGNOSIS — Z885 Allergy status to narcotic agent status: Secondary | ICD-10-CM | POA: Diagnosis not present

## 2018-08-17 DIAGNOSIS — J449 Chronic obstructive pulmonary disease, unspecified: Secondary | ICD-10-CM | POA: Diagnosis present

## 2018-08-17 DIAGNOSIS — R52 Pain, unspecified: Secondary | ICD-10-CM | POA: Diagnosis not present

## 2018-08-17 DIAGNOSIS — Z79891 Long term (current) use of opiate analgesic: Secondary | ICD-10-CM

## 2018-08-17 DIAGNOSIS — G894 Chronic pain syndrome: Secondary | ICD-10-CM | POA: Diagnosis present

## 2018-08-17 DIAGNOSIS — Z87891 Personal history of nicotine dependence: Secondary | ICD-10-CM

## 2018-08-17 DIAGNOSIS — I480 Paroxysmal atrial fibrillation: Secondary | ICD-10-CM | POA: Diagnosis present

## 2018-08-17 DIAGNOSIS — I998 Other disorder of circulatory system: Secondary | ICD-10-CM | POA: Diagnosis present

## 2018-08-17 DIAGNOSIS — K219 Gastro-esophageal reflux disease without esophagitis: Secondary | ICD-10-CM | POA: Diagnosis present

## 2018-08-17 DIAGNOSIS — E876 Hypokalemia: Secondary | ICD-10-CM | POA: Diagnosis present

## 2018-08-17 DIAGNOSIS — R54 Age-related physical debility: Secondary | ICD-10-CM | POA: Diagnosis present

## 2018-08-17 DIAGNOSIS — I4891 Unspecified atrial fibrillation: Secondary | ICD-10-CM | POA: Diagnosis not present

## 2018-08-17 DIAGNOSIS — I872 Venous insufficiency (chronic) (peripheral): Secondary | ICD-10-CM | POA: Diagnosis not present

## 2018-08-17 DIAGNOSIS — Z8249 Family history of ischemic heart disease and other diseases of the circulatory system: Secondary | ICD-10-CM

## 2018-08-17 DIAGNOSIS — L03116 Cellulitis of left lower limb: Secondary | ICD-10-CM | POA: Diagnosis present

## 2018-08-17 DIAGNOSIS — I482 Chronic atrial fibrillation, unspecified: Secondary | ICD-10-CM | POA: Diagnosis present

## 2018-08-17 DIAGNOSIS — M2550 Pain in unspecified joint: Secondary | ICD-10-CM | POA: Diagnosis not present

## 2018-08-17 DIAGNOSIS — R5381 Other malaise: Secondary | ICD-10-CM | POA: Diagnosis not present

## 2018-08-17 DIAGNOSIS — Z6827 Body mass index (BMI) 27.0-27.9, adult: Secondary | ICD-10-CM | POA: Diagnosis not present

## 2018-08-17 DIAGNOSIS — L02416 Cutaneous abscess of left lower limb: Secondary | ICD-10-CM | POA: Diagnosis present

## 2018-08-17 DIAGNOSIS — I739 Peripheral vascular disease, unspecified: Secondary | ICD-10-CM | POA: Diagnosis present

## 2018-08-17 DIAGNOSIS — Z7189 Other specified counseling: Secondary | ICD-10-CM | POA: Diagnosis not present

## 2018-08-17 DIAGNOSIS — M6281 Muscle weakness (generalized): Secondary | ICD-10-CM | POA: Diagnosis not present

## 2018-08-17 DIAGNOSIS — K224 Dyskinesia of esophagus: Secondary | ICD-10-CM | POA: Diagnosis present

## 2018-08-17 DIAGNOSIS — Z993 Dependence on wheelchair: Secondary | ICD-10-CM

## 2018-08-17 DIAGNOSIS — Z833 Family history of diabetes mellitus: Secondary | ICD-10-CM

## 2018-08-17 DIAGNOSIS — Z539 Procedure and treatment not carried out, unspecified reason: Secondary | ICD-10-CM | POA: Diagnosis present

## 2018-08-17 DIAGNOSIS — I130 Hypertensive heart and chronic kidney disease with heart failure and stage 1 through stage 4 chronic kidney disease, or unspecified chronic kidney disease: Secondary | ICD-10-CM | POA: Diagnosis present

## 2018-08-17 DIAGNOSIS — R627 Adult failure to thrive: Secondary | ICD-10-CM | POA: Diagnosis present

## 2018-08-17 DIAGNOSIS — N189 Chronic kidney disease, unspecified: Secondary | ICD-10-CM | POA: Diagnosis present

## 2018-08-17 DIAGNOSIS — Z89511 Acquired absence of right leg below knee: Secondary | ICD-10-CM

## 2018-08-17 DIAGNOSIS — Z7401 Bed confinement status: Secondary | ICD-10-CM | POA: Diagnosis not present

## 2018-08-17 DIAGNOSIS — Z888 Allergy status to other drugs, medicaments and biological substances status: Secondary | ICD-10-CM

## 2018-08-17 DIAGNOSIS — M810 Age-related osteoporosis without current pathological fracture: Secondary | ICD-10-CM | POA: Diagnosis present

## 2018-08-17 DIAGNOSIS — I5032 Chronic diastolic (congestive) heart failure: Secondary | ICD-10-CM | POA: Diagnosis present

## 2018-08-17 DIAGNOSIS — L0390 Cellulitis, unspecified: Secondary | ICD-10-CM | POA: Diagnosis not present

## 2018-08-17 DIAGNOSIS — N400 Enlarged prostate without lower urinary tract symptoms: Secondary | ICD-10-CM | POA: Diagnosis present

## 2018-08-17 DIAGNOSIS — I10 Essential (primary) hypertension: Secondary | ICD-10-CM | POA: Diagnosis present

## 2018-08-17 DIAGNOSIS — Z79899 Other long term (current) drug therapy: Secondary | ICD-10-CM

## 2018-08-17 DIAGNOSIS — Z66 Do not resuscitate: Secondary | ICD-10-CM | POA: Diagnosis present

## 2018-08-17 DIAGNOSIS — Z882 Allergy status to sulfonamides status: Secondary | ICD-10-CM

## 2018-08-17 DIAGNOSIS — I878 Other specified disorders of veins: Secondary | ICD-10-CM | POA: Diagnosis present

## 2018-08-17 DIAGNOSIS — Z8614 Personal history of Methicillin resistant Staphylococcus aureus infection: Secondary | ICD-10-CM

## 2018-08-17 DIAGNOSIS — Z7982 Long term (current) use of aspirin: Secondary | ICD-10-CM

## 2018-08-17 LAB — POCT I-STAT 4, (NA,K, GLUC, HGB,HCT)
Glucose, Bld: 110 mg/dL — ABNORMAL HIGH (ref 70–99)
HCT: 32 % — ABNORMAL LOW (ref 39.0–52.0)
Hemoglobin: 10.9 g/dL — ABNORMAL LOW (ref 13.0–17.0)
Potassium: 3 mmol/L — ABNORMAL LOW (ref 3.5–5.1)
Sodium: 138 mmol/L (ref 135–145)

## 2018-08-17 LAB — POCT I-STAT CREATININE: Creatinine, Ser: 0.9 mg/dL (ref 0.61–1.24)

## 2018-08-17 LAB — SURGICAL PCR SCREEN
MRSA, PCR: POSITIVE — AB
Staphylococcus aureus: POSITIVE — AB

## 2018-08-17 SURGERY — ABDOMINAL AORTOGRAM W/LOWER EXTREMITY
Anesthesia: LOCAL | Laterality: Left

## 2018-08-17 MED ORDER — MELATONIN 3 MG PO TABS
6.0000 mg | ORAL_TABLET | Freq: Every day | ORAL | Status: DC
  Administered 2018-08-18 – 2018-08-20 (×2): 6 mg via ORAL
  Filled 2018-08-17 (×3): qty 2

## 2018-08-17 MED ORDER — CARVEDILOL 6.25 MG PO TABS
6.2500 mg | ORAL_TABLET | Freq: Two times a day (BID) | ORAL | Status: DC
  Administered 2018-08-17 – 2018-08-20 (×7): 6.25 mg via ORAL
  Filled 2018-08-17 (×7): qty 1

## 2018-08-17 MED ORDER — ADULT MULTIVITAMIN W/MINERALS CH
1.0000 | ORAL_TABLET | Freq: Every day | ORAL | Status: DC
  Administered 2018-08-17 – 2018-08-20 (×4): 1 via ORAL
  Filled 2018-08-17 (×4): qty 1

## 2018-08-17 MED ORDER — LORAZEPAM 0.5 MG PO TABS: 0.5000 mg | ORAL_TABLET | Freq: Two times a day (BID) | ORAL | Status: DC | PRN

## 2018-08-17 MED ORDER — SODIUM CHLORIDE 0.9 % IV SOLN: INTRAVENOUS | Status: DC

## 2018-08-17 MED ORDER — DULOXETINE HCL 30 MG PO CPEP
30.0000 mg | ORAL_CAPSULE | Freq: Every day | ORAL | Status: DC
  Administered 2018-08-17 – 2018-08-20 (×4): 30 mg via ORAL
  Filled 2018-08-17 (×4): qty 1

## 2018-08-17 MED ORDER — MIRTAZAPINE 15 MG PO TABS
15.0000 mg | ORAL_TABLET | Freq: Every day | ORAL | Status: DC
  Administered 2018-08-17 – 2018-08-19 (×3): 15 mg via ORAL
  Filled 2018-08-17 (×3): qty 1

## 2018-08-17 MED ORDER — POTASSIUM CHLORIDE CRYS ER 20 MEQ PO TBCR
40.0000 meq | EXTENDED_RELEASE_TABLET | Freq: Once | ORAL | Status: AC
  Administered 2018-08-17: 40 meq via ORAL
  Filled 2018-08-17: qty 2

## 2018-08-17 MED ORDER — HEPARIN (PORCINE) 25000 UT/250ML-% IV SOLN
1250.0000 [IU]/h | INTRAVENOUS | Status: DC
  Administered 2018-08-17: 1250 [IU]/h via INTRAVENOUS
  Filled 2018-08-17 (×2): qty 250

## 2018-08-17 MED ORDER — ACETAMINOPHEN 325 MG PO TABS
650.0000 mg | ORAL_TABLET | Freq: Four times a day (QID) | ORAL | Status: DC | PRN
  Administered 2018-08-17 – 2018-08-20 (×4): 650 mg via ORAL
  Filled 2018-08-17 (×4): qty 2

## 2018-08-17 MED ORDER — OXYCODONE HCL 5 MG PO TABS
5.0000 mg | ORAL_TABLET | Freq: Three times a day (TID) | ORAL | Status: DC | PRN
  Administered 2018-08-18 (×2): 5 mg via ORAL
  Filled 2018-08-17 (×2): qty 1

## 2018-08-17 MED ORDER — FUROSEMIDE 20 MG PO TABS
20.0000 mg | ORAL_TABLET | Freq: Two times a day (BID) | ORAL | Status: DC
  Administered 2018-08-17: 20 mg via ORAL
  Filled 2018-08-17 (×2): qty 1

## 2018-08-17 MED ORDER — ALBUTEROL SULFATE (2.5 MG/3ML) 0.083% IN NEBU
2.5000 mg | INHALATION_SOLUTION | RESPIRATORY_TRACT | Status: DC | PRN
  Administered 2018-08-17: 2.5 mg via RESPIRATORY_TRACT
  Filled 2018-08-17: qty 3

## 2018-08-17 MED ORDER — ATORVASTATIN CALCIUM 10 MG PO TABS
20.0000 mg | ORAL_TABLET | Freq: Every day | ORAL | Status: DC
  Administered 2018-08-17 – 2018-08-19 (×3): 20 mg via ORAL
  Filled 2018-08-17 (×3): qty 2

## 2018-08-17 MED ORDER — ASPIRIN EC 81 MG PO TBEC
81.0000 mg | DELAYED_RELEASE_TABLET | Freq: Every day | ORAL | Status: DC
  Administered 2018-08-17 – 2018-08-20 (×4): 81 mg via ORAL
  Filled 2018-08-17 (×4): qty 1

## 2018-08-17 NOTE — Progress Notes (Addendum)
Report called to 6N spoke with Delia Heady

## 2018-08-17 NOTE — Progress Notes (Signed)
Spoke to Dr Montey Hora states to admit pt to a medical bed

## 2018-08-17 NOTE — Progress Notes (Signed)
ANTICOAGULATION CONSULT NOTE - Initial Consult  Pharmacy Consult for Heparin Indication: atrial fibrillation  Allergies  Allergen Reactions  . Percocet [Oxycodone-Acetaminophen] Other (See Comments)    Documented on MAR  . Sulfa Antibiotics Hives  . Sulfonamide Derivatives Hives  . Tramadol Itching and Other (See Comments)    Patient Measurements: Height: 5\' 11"  (180.3 cm) Weight: 195 lb 1.7 oz (88.5 kg) IBW/kg (Calculated) : 75.3 Heparin Dosing Weight:  88.5 kg  Vital Signs: Temp: 97.4 F (36.3 C) (02/21 1355) Temp Source: Oral (02/21 1355) BP: 106/92 (02/21 1355) Pulse Rate: 74 (02/21 1355)  Labs: Recent Labs    08/17/18 0940  HGB 10.9*  HCT 32.0*  CREATININE 0.90    Estimated Creatinine Clearance: 66.2 mL/min (by C-G formula based on SCr of 0.9 mg/dL).   Medical History: Past Medical History:  Diagnosis Date  . Achalasia   . Alcohol abuse    stop drinking since 01/2009  . Arthritis   . Asthma   . Atrial fibrillation (HCC)    off coumadin secondary to fall and subdural  hematoma   . B12 deficiency 9/10   started injection   . BPH (benign prostatic hyperplasia)   . Carcinoma in situ of bladder 2014   Dr. Annabell Howells  . Chronic kidney disease    RECENT UTI FEW MONTHS AGO TX WITH ANTIBIOTIC  . Compression fracture   . COPD (chronic obstructive pulmonary disease) (HCC)   . Diverticula of colon    few small scattered in the sigmoid colon  . Enlarged prostate   . External hemorrhoids 09/2014  . GERD (gastroesophageal reflux disease)   . H/O hiatal hernia   . Headache(784.0)   . Hypertension   . Nutcracker esophagus 2002   on EM  . Osteoporosis   . Peripheral edema   . Reflux   . Venous stasis    chronic     Assessment: CC/HPI: L wound infection  PMH: peripheral vascular disease, extensive physical debility and wheelchair-bound, nonhealing right leg wound status post right below-knee amputation in 2018, history of MRSA bacteremia treated with  daptomycin, currently at a nursing home, paroxysmal A. fib and chronic dysphagia with esophageal dysmotility  Anticoag: Afib on Eliquis PTA. Last dose 2/20 PM. Hgb 10.9. Hold Eliquis for possibly surgery and transition to IV heparin.  Goal of Therapy:  Heparin level 0.3-0.7 units/ml Monitor platelets by anticoagulation protocol: Yes   Plan:  IV heparin (no bolus requested by MD to start) at 1250 units/hr Check heparin level in 6 hrs. Daily HL and CBC  Comfort Iversen S. Merilynn Finland, PharmD, BCPS Clinical Staff Pharmacist Misty Stanley Stillinger 08/17/2018,2:54 PM

## 2018-08-17 NOTE — H&P (Signed)
History and Physical    [name] [name] IZT:[id number] DOB: 09/19/1932 DOA: 08/17/2018  PCP: Mikey Kirschner, MD  Patient coming from: nursing home   I have personally briefly reviewed patient's old medical records available.   Chief Complaint: left leg wound infection   HPI: [name] [name] is a 85 y.o. male with medical history significant of severe peripheral vascular disease, extensive physical debility and wheelchair-bound, nonhealing right leg wound status post right below-knee amputation in 2018, history of MRSA bacteremia treated with daptomycin, currently at a nursing home, paroxysmal A. fib and chronic dysphagia with esophageal dysmotility who was brought to the vascular lab today for possible arteriogram and revascularization.  Patient has extensive wounds and gangrenous toes.  He is also on Eliquis.  Procedure was canceled and admission was requested. When I examined the patient, his 2 sons were at the bedside.  Patient was tired and sleepy.  His only complaint is his left leg is not getting better and he wants to try more antibiotics.  He does not want to have amputation at this time. He has pain and that is mostly on his lower back than his leg. He does have difficulty swallowing and was investigated in the past and remains on soft diet. Is debilitated, uses wheelchair and rarely gets around. Denies any nausea vomiting.  Denies any chest pain or shortness of breath.  Patient was seen at vascular lab.  Hemodynamically stable.  Blood pressures are stable.  WBC count is pending.  Potassium is 3.  Case discussed with Dr. Doren Custard and decided to admit patient with heparin infusion, antibiotics with continue management of left leg and possible amputation.  Met with patient's 2 son at the bedside who has been convinced that his leg is not salvageable, however they were not able to convince their father about getting amputation.  Review of Systems: As per HPI otherwise 10 point review of systems  negative.    Past Medical History:  Diagnosis Date  . Achalasia   . Alcohol abuse    stop drinking since 01/2009  . Arthritis   . Asthma   . Atrial fibrillation (HCC)    off coumadin secondary to fall and subdural  hematoma   . B12 deficiency 9/10   started injection   . BPH (benign prostatic hyperplasia)   . Carcinoma in situ of bladder 2014   Dr. Jeffie Pollock  . Chronic kidney disease    RECENT UTI FEW MONTHS AGO TX WITH ANTIBIOTIC  . Compression fracture   . COPD (chronic obstructive pulmonary disease) ([place])   . Diverticula of colon    few small scattered in the sigmoid colon  . Enlarged prostate   . External hemorrhoids 09/2014  . GERD (gastroesophageal reflux disease)   . H/O hiatal hernia   . Headache(784.0)   . Hypertension   . Nutcracker esophagus 2002   on EM  . Osteoporosis   . Peripheral edema   . Reflux   . Venous stasis    chronic     Past Surgical History:  Procedure Laterality Date  . AMPUTATION Right 04/13/2017   Procedure: RIGHT AMPUTATION BELOW KNEE;  Surgeon: Angelia Mould, MD;  Location: [facility];  Service: Vascular;  Laterality: Right;  . BLADDER ASPIRATION     PROC FOR CANCER CELLS   . CATARACT EXTRACTION W/PHACO  07/30/2012   Procedure: CATARACT EXTRACTION PHACO AND INTRAOCULAR LENS PLACEMENT (IOC);  Surgeon: Williams Che, MD;  Location: [facility];  Service: Ophthalmology;  Laterality: Left;  CDE:31.66  . CATARACT EXTRACTION W/PHACO Right 11/12/2012   Procedure: CATARACT EXTRACTION PHACO AND INTRAOCULAR LENS PLACEMENT (IOC);  Surgeon: Williams Che, MD;  Location: [facility];  Service: Ophthalmology;  Laterality: Right;  CDE 18.25  . CYSTOSCOPY  10/18/2011   Procedure: CYSTOSCOPY;  Surgeon: Malka So, MD;  Location: [facility];  Service: Urology;  Laterality: N/A;  . ESOPHAGOGASTRODUODENOSCOPY  02/11/2010   RMR: GE narrowing s/p dilation 56 & 40 Fr maloney otherwise normal  . ESOPHAGOGASTRODUODENOSCOPY N/A 10/01/2015   Dr.Rourk- mildly severe  esophagitis, likely pill-induced, narrowed GE junction without fixed lesion found ?query achalasia, s/p maloney dilation, nomal stomach, normal second portion of the duodenum  . HERNIA REPAIR  12 YRS AGO   RIGHT SIDE   . JOINT REPLACEMENT     RT HIP X2  (1 REPLACEMENT)  . LUNG BIOPSY  10 YRS AGO   BENIGN  . MALONEY DILATION N/A 10/01/2015   Procedure: Venia Minks DILATION;  Surgeon: Daneil Dolin, MD;  [facility];  Service: Endoscopy;  Laterality: N/A;  . PERIPHERAL VASCULAR CATHETERIZATION N/A 10/23/2015   Procedure: Abdominal Aortogram w/Lower Extremity;  Surgeon: Elam Dutch, MD;  Location: [facility];  Service: Cardiovascular;  Laterality: N/A;  . RHINOPLASTY  1974     reports that he has never smoked. He quit smokeless tobacco use about 27 years ago.  His smokeless tobacco use included chew. He reports that he does not drink alcohol or use drugs.  Allergies  Allergen Reactions  . Percocet [Oxycodone-Acetaminophen] Other (See Comments)    Documented on MAR  . Sulfa Antibiotics Hives  . Sulfonamide Derivatives Hives  . Tramadol Itching and Other (See Comments)    Family History  Problem Relation Age of Onset  . Cancer Brother   . Hypertension Mother   . Diabetes Mother   . Heart attack Mother   . Heart disease Mother   . Colon cancer Neg Hx   . Gastric cancer Neg Hx   . Esophageal cancer Neg Hx      Prior to Admission medications   Medication Sig Start Date End Date Taking? Authorizing Provider  acetaminophen (TYLENOL) 325 MG tablet Take 650 mg by mouth every 6 (six) hours as needed for mild pain.   Yes [provider]  amoxicillin-clavulanate (AUGMENTIN) 875-125 MG tablet Take 1 tablet by mouth 2 (two) times daily. Start 08-15-18 Stop 08-19-18 08/15/18 08/19/18 Yes [provider]  apixaban (ELIQUIS) 5 MG TABS tablet Take 5 mg by mouth 2 (two) times daily.   Yes [provider]  aspirin EC 81 MG tablet Take 81 mg by mouth  daily.    Yes [provider]  atorvastatin (LIPITOR) 20 MG tablet Take 20 mg by mouth daily at 6 PM.   Yes [provider]  carvedilol (COREG) 6.25 MG tablet Take 6.25 mg by mouth 2 (two) times daily.   Yes [provider]  DULoxetine (CYMBALTA) 30 MG capsule Take 30 mg by mouth daily.   Yes [provider]  furosemide (LASIX) 20 MG tablet Take 20 mg by mouth 2 (two) times daily.   Yes [provider]  levalbuterol (XOPENEX) 0.63 MG/3ML nebulizer solution Take 0.63 mg by nebulization every 6 (six) hours as needed for wheezing or shortness of breath.   Yes [provider]  LORazepam (ATIVAN) 0.5 MG tablet Take 0.5 mg by mouth 2 (two) times daily as needed for anxiety.   Yes [provider]  Melatonin 5 MG TABS Take 5 mg by mouth at bedtime.    Yes [provider]  mirtazapine (REMERON) 15 MG tablet Take 15 mg by mouth at bedtime.    Yes [provider]  Multiple Vitamin (MULTIVITAMIN WITH MINERALS) TABS tablet Take 1 tablet by mouth daily.   Yes [provider]  oxyCODONE (OXY IR/ROXICODONE) 5 MG immediate release tablet Take 5 mg by mouth 3 (three) times daily as needed for severe pain.    Yes [provider]  potassium chloride (K-DUR,KLOR-CON) 10 MEQ tablet Take 10 mEq by mouth daily.   Yes [provider]  vitamin C (ASCORBIC ACID) 500 MG tablet Take 500 mg by mouth 2 (two) times daily.   Yes [provider]  zinc gluconate 50 MG tablet Take 50 mg by mouth daily.   Yes [provider]    Physical Exam: Vitals:   08/17/18 0940 08/17/18 0948  BP: 100/65   Pulse: 68   Resp: 16   SpO2: 94%   Weight:  88.5 kg  Height:  _0  (1.803 m)    Constitutional: NAD, calm, comfortable Vitals:   08/17/18 0940 08/17/18 0948  BP: 100/65   Pulse: 68   Resp: 16   SpO2: 94%   Weight:  88.5 kg  Height:  _1  (1.803 m)   Eyes: PERRL, lids and conjunctivae normal Alert  oriented x3, chronically sick looking and lethargic. ENMT: Mucous membranes are dry.  Posterior pharynx clear of any exudate or lesions.Normal dentition.  Neck: normal, supple, no masses, no thyromegaly Respiratory: clear to auscultation bilaterally, no wheezing, no crackles. Normal respiratory effort. No accessory muscle use.  Cardiovascular: Regular rate and rhythm, no murmurs / rubs / gallops. No extremity edema. 2+ pedal pulses. No carotid bruits.  Abdomen: no tenderness, no masses palpated. No hepatosplenomegaly. Bowel sounds positive.  Skin: no rashes, lesions, ulceration and induration as below. Neurologic: CN 2-12 grossly intact. Sensation intact, DTR normal. Strength 5/5 in all 4.  Psychiatric: Normal judgment and insight. Alert and oriented x 3.  Lethargic sick looking. Musculoskeletal: no clubbing / cyanosis. Right below-knee amputation stump clean and dry. Left leg: Gangrenous changes on left third toe, shiny and edematous, erythematous and induration up to the knee and above, no visible drainage. No palpable pulse. Chronic ischemic-looking skin.   Labs on Admission: I have personally reviewed following labs and imaging studies  CBC: Recent Labs  Lab 08/17/18 0940  HGB 10.9*  HCT 34.1*   Basic Metabolic Panel: Recent Labs  Lab 08/17/18 0940  NA 138  K 3.0*  GLUCOSE 110*  CREATININE 0.90   GFR: Estimated Creatinine Clearance: 66.2 mL/min (by C-G formula based on SCr of 0.9 mg/dL). Liver Function Tests: No results for input(s): AST, ALT, ALKPHOS, BILITOT, PROT, ALBUMIN in the last 168 hours. No results for input(s): LIPASE, AMYLASE in the last 168 hours. No results for input(s): AMMONIA in the last 168 hours. Coagulation Profile: No results for input(s): INR, PROTIME in the last 168 hours. Cardiac Enzymes: No results for input(s): CKTOTAL, CKMB, CKMBINDEX, TROPONINI in the last 168 hours. BNP (last 3 results) No results for input(s): PROBNP in the last 8760  hours. HbA1C: No results for input(s): HGBA1C in the last 72 hours. CBG: No results for input(s): GLUCAP in the last 168 hours. Lipid Profile: No results for input(s): CHOL, HDL, LDLCALC, TRIG, CHOLHDL, LDLDIRECT in the last 72 hours. Thyroid Function Tests: No results for input(s): TSH, T4TOTAL,  FREET4, T3FREE, THYROIDAB in the last 72 hours. Anemia Panel: No results for input(s): VITAMINB12, FOLATE, FERRITIN, TIBC, IRON, RETICCTPCT in the last 72 hours. Urine analysis:    Component Value Date/Time   COLORURINE YELLOW 04/10/2017 0907   APPEARANCEUR HAZY (A) 04/10/2017 0907   LABSPEC 1.028 04/10/2017 0907   PHURINE 5.0 04/10/2017 0907   GLUCOSEU NEGATIVE 04/10/2017 0907   HGBUR MODERATE (A) 04/10/2017 0907   BILIRUBINUR NEGATIVE 04/10/2017 0907   KETONESUR 5 (A) 04/10/2017 0907   PROTEINUR 30 (A) 04/10/2017 0907   UROBILINOGEN 0.2 02/21/2011 1842   NITRITE NEGATIVE 04/10/2017 0907   LEUKOCYTESUR NEGATIVE 04/10/2017 0907    Radiological Exams on Admission: No results found.  EKG: Independently reviewed.  Shows atrial fibrillation with right bundle branch block, He does have A. fib and right bundle branch block patterns in all previous EKGs. Assessment/Plan Principal Problem:   Cellulitis and abscess of left leg Active Problems:   COPD (chronic obstructive pulmonary disease) (HCC)   ESOPHAGEAL MOTILITY DISORDER   GERD   Essential hypertension, benign   Chronic pain syndrome   AF (paroxysmal atrial fibrillation) (HCC)   Hx of right BKA (HCC)   PAD (peripheral artery disease) ([place])     1.  Cellulitis and abscess of the left leg, severe peripheral vascular disease, gangrene of the toes: Agree with admission given severity of symptoms. Has history of MRSA infection, will treat with daptomycin. His leg is probably not salvageable, medical team and surgical team will continue to follow-up with patient. Patient may probably need above-knee amputation on his left leg. Blood  cultures were not drawn, patient is on oral antibiotics for long time. No local drainage for wound culture. Adequate pain medications including oral narcotics.  2.  Chronic atrial fibrillation: Rate controlled.  Hold Eliquis for possible surgery.  Will keep on a heparin infusion for bridging.  Hemoglobin is stable.  3.  Hypokalemia: We will replace and monitor levels.  4.  Hypertension: Fairly stable.  Resume home medications.  5.  GERD: On PPI.  Continue.    DVT prophylaxis: Heparin infusion. Code Status: Full code. Family Communication: Patient's 2 son at the bedside. Disposition Plan: Unknown at this time.  Probably back to nursing home. Consults called: Followed with vascular surgery. Admission status: Inpatient.   Barb Merino MD [facility] Pager [phone]  If 7PM-7AM, please contact night-coverage [email or web address] Password [facility]  08/17/2018, 11:58 AM

## 2018-08-17 NOTE — Progress Notes (Signed)
Dr Edilia Bo has cancelled the procedure and will have the hospitalist  to see and admit him. Dr Montey Hora called and informed of pt location.

## 2018-08-18 DIAGNOSIS — Z7189 Other specified counseling: Secondary | ICD-10-CM

## 2018-08-18 DIAGNOSIS — G894 Chronic pain syndrome: Secondary | ICD-10-CM

## 2018-08-18 DIAGNOSIS — Z515 Encounter for palliative care: Secondary | ICD-10-CM

## 2018-08-18 DIAGNOSIS — J449 Chronic obstructive pulmonary disease, unspecified: Secondary | ICD-10-CM

## 2018-08-18 DIAGNOSIS — L02416 Cutaneous abscess of left lower limb: Secondary | ICD-10-CM

## 2018-08-18 DIAGNOSIS — I739 Peripheral vascular disease, unspecified: Principal | ICD-10-CM

## 2018-08-18 LAB — BASIC METABOLIC PANEL
Anion gap: 8 (ref 5–15)
BUN: 7 mg/dL — ABNORMAL LOW (ref 8–23)
CO2: 31 mmol/L (ref 22–32)
Calcium: 8 mg/dL — ABNORMAL LOW (ref 8.9–10.3)
Chloride: 99 mmol/L (ref 98–111)
Creatinine, Ser: 0.7 mg/dL (ref 0.61–1.24)
GFR calc Af Amer: >60 mL/min (ref >60)
Glucose, Bld: 88 mg/dL (ref 70–99)
Potassium: 3 mmol/L — ABNORMAL LOW (ref 3.5–5.1)
Sodium: 138 mmol/L (ref 135–145)

## 2018-08-18 LAB — APTT
aPTT: 185 seconds (ref 24–36)
aPTT: 143 seconds — ABNORMAL HIGH (ref 24–36)
aPTT: 107 seconds — ABNORMAL HIGH (ref 24–36)

## 2018-08-18 LAB — CBC WITH DIFFERENTIAL/PLATELET
Abs Immature Granulocytes: 0.63 10*3/uL — ABNORMAL HIGH (ref 0.00–0.07)
Basophils Absolute: 0.1 10*3/uL (ref 0.0–0.1)
Basophils Relative: 1 %
Eosinophils Absolute: 0.2 10*3/uL (ref 0.0–0.5)
Eosinophils Relative: 3 %
HCT: 30.2 % — ABNORMAL LOW (ref 39.0–52.0)
Hemoglobin: 9.1 g/dL — ABNORMAL LOW (ref 13.0–17.0)
Immature Granulocytes: 8 %
Lymphocytes Relative: 12 %
Lymphs Abs: 0.9 10*3/uL (ref 0.7–4.0)
MCH: 29.6 pg (ref 26.0–34.0)
MCHC: 30.1 g/dL (ref 30.0–36.0)
MCV: 98.4 fL (ref 80.0–100.0)
MONO ABS: 0.4 10*3/uL (ref 0.1–1.0)
Monocytes Relative: 5 %
Neutro Abs: 5.3 10*3/uL (ref 1.7–7.7)
Neutrophils Relative %: 71 %
Platelets: 244 10*3/uL (ref 150–400)
RBC: 3.07 MIL/uL — ABNORMAL LOW (ref 4.22–5.81)
RDW: 14.7 % (ref 11.5–15.5)
WBC: 7.5 10*3/uL (ref 4.0–10.5)
nRBC: 0 % (ref 0.0–0.2)

## 2018-08-18 LAB — HEPARIN LEVEL (UNFRACTIONATED)
HEPARIN UNFRACTIONATED: 1.58 [IU]/mL — AB (ref 0.30–0.70)
Heparin Unfractionated: >2.2 IU/mL — ABNORMAL HIGH (ref 0.30–0.70)
Heparin Unfractionated: 2.14 IU/mL — ABNORMAL HIGH (ref 0.30–0.70)

## 2018-08-18 MED ORDER — OXYCODONE HCL ER 10 MG PO T12A
10.0000 mg | EXTENDED_RELEASE_TABLET | Freq: Two times a day (BID) | ORAL | Status: DC
  Administered 2018-08-18 – 2018-08-20 (×4): 10 mg via ORAL
  Filled 2018-08-18 (×4): qty 1

## 2018-08-18 MED ORDER — HEPARIN (PORCINE) 25000 UT/250ML-% IV SOLN
850.0000 [IU]/h | INTRAVENOUS | Status: DC
  Administered 2018-08-18: 1000 [IU]/h via INTRAVENOUS
  Administered 2018-08-19: 850 [IU]/h via INTRAVENOUS
  Filled 2018-08-18 (×3): qty 250

## 2018-08-18 MED ORDER — ALBUTEROL SULFATE (2.5 MG/3ML) 0.083% IN NEBU
2.5000 mg | INHALATION_SOLUTION | Freq: Two times a day (BID) | RESPIRATORY_TRACT | Status: DC
  Administered 2018-08-18 – 2018-08-19 (×3): 2.5 mg via RESPIRATORY_TRACT
  Filled 2018-08-18 (×3): qty 3

## 2018-08-18 MED ORDER — POTASSIUM CHLORIDE CRYS ER 20 MEQ PO TBCR
40.0000 meq | EXTENDED_RELEASE_TABLET | Freq: Two times a day (BID) | ORAL | Status: AC
  Administered 2018-08-18 (×2): 40 meq via ORAL
  Filled 2018-08-18 (×2): qty 2

## 2018-08-18 MED ORDER — SENNOSIDES-DOCUSATE SODIUM 8.6-50 MG PO TABS
2.0000 | ORAL_TABLET | Freq: Every day | ORAL | Status: DC
  Administered 2018-08-19: 2 via ORAL
  Filled 2018-08-18 (×2): qty 2

## 2018-08-18 MED ORDER — FUROSEMIDE 40 MG PO TABS
40.0000 mg | ORAL_TABLET | Freq: Two times a day (BID) | ORAL | Status: AC
  Administered 2018-08-18 – 2018-08-19 (×2): 40 mg via ORAL
  Filled 2018-08-18 (×2): qty 1

## 2018-08-18 MED ORDER — OXYCODONE HCL 5 MG PO TABS
5.0000 mg | ORAL_TABLET | Freq: Four times a day (QID) | ORAL | Status: DC | PRN
  Administered 2018-08-18: 5 mg via ORAL
  Filled 2018-08-18: qty 1

## 2018-08-18 NOTE — Progress Notes (Signed)
ANTICOAGULATION CONSULT NOTE   Pharmacy Consult for Heparin Indication: atrial fibrillation  Allergies  Allergen Reactions  . Percocet [Oxycodone-Acetaminophen] Other (See Comments)    Documented on MAR  . Sulfa Antibiotics Hives  . Sulfonamide Derivatives Hives  . Tramadol Itching and Other (See Comments)    Patient Measurements: Height: 5\' 11"  (180.3 cm) Weight: 195 lb 1.7 oz (88.5 kg) IBW/kg (Calculated) : 75.3 Heparin Dosing Weight:  88.5 kg  Vital Signs: Temp: 98 F (36.7 C) (02/22 0559) Temp Source: Axillary (02/22 0559) BP: 116/67 (02/22 0559) Pulse Rate: 58 (02/22 0559)  Labs: Recent Labs    08/17/18 0940 08/17/18 2232 08/18/18 0332 08/18/18 0823 08/18/18 1336  HGB 10.9*  --  9.1*  --   --   HCT 32.0*  --  30.2*  --   --   PLT  --   --  244  --   --   APTT  --  185* 143*  --  107*  HEPARINUNFRC  --  >2.20* 2.14*  --  1.58*  CREATININE 0.90  --   --  0.70  --     Estimated Creatinine Clearance: 74.5 mL/min (by C-G formula based on SCr of 0.7 mg/dL).   Assessment: Afib on Eliquis PTA. Last dose 2/20 PM. Hgb 10.9. Hold Eliquis for possibly surgery and transition to IV heparin. Heparin level 1.58, PTT 107 seconds  Goal of Therapy:  APTT 66-102 sec Heparin level 0.3-0.7 units/ml Monitor platelets by anticoagulation protocol: Yes   Plan:  Heparin to 900 units / hr Heparin level, PTT, CBC daily  Thanks for allowing pharmacy to be a part of this patient's care. Okey Regal, PharmD Clinical Pharmacist

## 2018-08-18 NOTE — Consult Note (Signed)
Consultation Note Date: 08/18/2018   Patient Name: [name] [name]  DOB: 1933-05-06  MRN: [id number]  Age / Sex: 85 y.o., male  PCP: [name] Kirschner, MD Referring Physician: Domenic Polite, MD  Reason for Consultation: Establishing goals of care and Psychosocial/spiritual support,  HPI/Patient Profile: 85 y.o. male  with past medical history of CHF, COPD, CKD, bladder cancer, atrial fibrillation, achalasia, severe peripheral vascular disease with right BKA in 2018, MRSA bacteremia  who was admitted on 08/17/2018 with cellulitis/abscess of his left leg.  He was found to have wounds and gangrenous toes on his left lower extremity.  He has had this evaluated by both [place].   Clinical Assessment and Goals of Care:  I have reviewed medical records including EPIC notes, labs and imaging, received report from Dr. Broadus [name], assessed the patient and talked with him at length  to discuss diagnosis prognosis, [place], EOL wishes, disposition and options.  I also spoke with his son [name] [name] before speaking with the patient and again after speaking with the patient.  I introduced Palliative Medicine as specialized medical care for people living with serious illness. It focuses on providing relief from the symptoms and stress of a serious illness. The goal is to improve quality of life for both the patient and the family.  We discussed a brief life review of the patient.   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  He met his wife [name] [name] at church and the 2 of them of been married for 60 years.  They have 2 sons [name] [name] (who is the primary caretaker) and [name] [name].  [name] [name] is a very wise man.  He talked a lot about his concern for [facility].  They promised each other they would  never leave one another in the nursing home.  He wants to get back to [facility] rehab so that he can help feed [employer] at meals.  We talked about surgery and his symptoms.  [name] [name] that he suffers with phantom pain in his right leg.  He also tells [name] that the pain in his left leg is constant.  The only thing that helps the pain in his left leg is oxycodone and Tylenol.  He feels the oxycodone run out right at 6 hours.  He also complained of constipation despite using Metamucil and Ex-Lax.  With regard to surgery he did not feel that the amputation on his right leg solved any problems.  He is concerned that he is too old and feeble to learn to walk with a prosthesis.  He is concerned that he would not recover after surgery.  He tells [name] firmly that he has decided that he does not want to have his leg amputated "for now ".  Later in the conversation he told [name] again that he was going to postpone  amputation of his leg.  I clearly explained to [name] [name] that he is not a good surgical candidate due to his breathing and comorbidities and that we both knew if he did not have surgery now he would never have surgery.  [name] [name] agreed with [name].  We discussed the arteriogram.  I explained that his medical providers feel it is highly unlikely there would be any vasculature that could be repaired and that the arteriogram would not be helpful.  [name] [name] did not see the reason to do an arteriogram if it was not going to be helpful.  [name] [name] and I discussed going back to rehab to be with [name] [name] with hospice support.  I explained to [name] [name] that hospice does not do physical therapy, rather they make people as comfortable and happy as possible during their final phase of life.  [name] [name] is in agreement with going back to rehab with hospice support.  He is anxious to be with [facility].  I spoke with [name] [name] after meeting with [name] [name].  [name] [name] is very sad that his father's life is in its final phase.  I explained to him [name] [name]'s decision and why I thought  that returning to rehab with hospice was actually [name] [name]'s best option.  I believe that avoiding a surgery would allow him to spend his time at rehab with [facility] rather than in the hospital attempting to recover from surgery.  [name] [name] understood and respected his father's decision.    Primary Decision Maker:  PATIENT    SUMMARY OF RECOMMENDATIONS     Please return to rehab with hospice support ([name] [name]?)  once patient is medically optimized.  Patient son [name] [name] comments that they have already spoken with Jinny Blossom in the [name] [name] office of [name] [name].   Will initiate OxyContin 10 mg twice daily with OxyIR 5 mg every 6 as needed's breakthrough pain.   We will initiate senna 2 tablets nightly.   Patient reports difficulty breathing improved by nebulizer treatments.  Will schedule albuterol neb bid in addition to current PRN nebs.  Code Status/Advance Care Planning:  DNR   Symptom Management:   As above.  Additional Recommendations (Limitations, Scope, Preferences):  No Surgical Procedures  Palliative Prophylaxis:   Bowel Regimen and Frequent Pain Assessment  Psycho-social/Spiritual:   Desire for further Chaplaincy support: yes   Prognosis:  Less than 3 months given active infection in his lower extremity, CHF, COPD, CKD  Discharge Planning: [name] [name] with Hospice      Primary Diagnoses: Present on Admission: . PAD (peripheral artery disease) ([place]) . COPD (chronic obstructive pulmonary disease) ([place]) . ESOPHAGEAL MOTILITY DISORDER . GERD . Essential hypertension, benign . Chronic pain syndrome . AF (paroxysmal atrial fibrillation) ([place]) . Cellulitis and abscess of left leg   I have reviewed the medical record, interviewed the patient and family, and examined the patient. The following aspects are pertinent.  Past Medical History:  Diagnosis Date  . Achalasia   . Alcohol abuse    stop drinking since 01/2009  .  Arthritis   . Asthma   . Atrial fibrillation (HCC)    off coumadin secondary to fall and subdural  hematoma   . B12 deficiency 9/10   started injection   . BPH (benign prostatic hyperplasia)   . Carcinoma in situ of bladder 2014   Dr. Jeffie Pollock  . Chronic kidney disease    RECENT UTI FEW MONTHS AGO TX WITH ANTIBIOTIC  . Compression fracture   . COPD (chronic  obstructive pulmonary disease) ([place])   . Diverticula of colon    few small scattered in the sigmoid colon  . Enlarged prostate   . External hemorrhoids 09/2014  . GERD (gastroesophageal reflux disease)   . H/O hiatal hernia   . Headache(784.0)   . Hypertension   . Nutcracker esophagus 2002   on EM  . Osteoporosis   . Peripheral edema   . Reflux   . Venous stasis    chronic    Social History   Socioeconomic History  . Marital status: Married    Spouse name: Not on file  . Number of children: 2  . Years of education: Not on file  . Highest education level: Not on file  Occupational History  . Not on file  Social Needs  . Financial resource strain: Not on file  . Food insecurity:    Worry: Not on file    Inability: Not on file  . Transportation needs:    Medical: Not on file    Non-medical: Not on file  Tobacco Use  . Smoking status: Never Smoker  . Smokeless tobacco: Former Systems developer    Types: Chew  Substance and Sexual Activity  . Alcohol use: No    Alcohol/week: 0.0 standard drinks    Comment: history of alcohol abuse stop drinking 8/10 . Alcohol abuse for about 25 years   . Drug use: No  . Sexual activity: Not on file  Lifestyle  . Physical activity:    Days per week: Not on file    Minutes per session: Not on file  . Stress: Not on file  Relationships  . Social connections:    Talks on phone: Not on file    Gets together: Not on file    Attends religious service: Not on file    Active member of club or organization: Not on file    Attends meetings of clubs or organizations: Not on file    Relationship  status: Not on file  Other Topics Concern  . Not on file  Social History Narrative  . Not on file   Family History  Problem Relation Age of Onset  . Cancer Brother   . Hypertension Mother   . Diabetes Mother   . Heart attack Mother   . Heart disease Mother   . Colon cancer Neg Hx   . Gastric cancer Neg Hx   . Esophageal cancer Neg Hx    Scheduled Meds: . aspirin EC  81 mg Oral Daily  . atorvastatin  20 mg Oral q1800  . carvedilol  6.25 mg Oral BID  . DULoxetine  30 mg Oral Daily  . furosemide  40 mg Oral BID  . Melatonin  6 mg Oral QHS  . mirtazapine  15 mg Oral QHS  . multivitamin with minerals  1 tablet Oral Daily  . potassium chloride  40 mEq Oral BID   Continuous Infusions: . heparin 1,000 Units/hr (08/18/18 0630)   PRN Meds:.acetaminophen, albuterol, LORazepam, oxyCODONE Allergies  Allergen Reactions  . Percocet [Oxycodone-Acetaminophen] Other (See Comments)    Documented on MAR  . Sulfa Antibiotics Hives  . Sulfonamide Derivatives Hives  . Tramadol Itching and Other (See Comments)   Review of Systems + constipation, LLE pain, phantom pain on the right, SOB, excessive sputum  Physical Exam well developed male, awake, alert, coherent, pleasant CV brady, resp increased work of breathing.  Speaking causes further increased work of breathing.  Vital Signs: BP 116/67 (BP Location: Right  Arm)   Pulse (!) 58   Temp 98 F (36.7 C) (Axillary)   Resp 16   Ht 5' 11"  (1.803 m)   Wt 88.5 kg   SpO2 98%   BMI 27.21 kg/m  Pain Scale: 0-10   Pain Score: Asleep   SpO2: SpO2: 98 % O2 Device:SpO2: 98 % O2 Flow Rate: .   IO: Intake/output summary:   Intake/Output Summary (Last 24 hours) at 08/18/2018 1426 Last data filed at 08/18/2018 1300 Gross per 24 hour  Intake 612.2 ml  Output 500 ml  Net 112.2 ml    LBM: Last BM Date: 08/17/18 Baseline Weight: Weight: 88.5 kg Most recent weight: Weight: 88.5 kg     Palliative Assessment/Data: 30%     Time In:  3:00 Time Out: 4:50 Time Total: 110 Greater than 50%  of this time was spent counseling and coordinating care related to the above assessment and plan.  Signed by: Florentina Jenny, PA-C Palliative Medicine Pager: [phone]  Please contact Palliative Medicine Team phone at [phone] for questions and concerns.  For individual provider: See Shea Evans

## 2018-08-18 NOTE — Progress Notes (Signed)
   VASCULAR SURGERY ASSESSMENT & PLAN:   NONHEALING WOUNDS LEFT LEG: The patient has had significant progression of his nonhealing wounds of the left leg.  His arteriogram was canceled yesterday as he had received Eliquis on Thursday night.  This is now on hold and he is on intravenous heparin for his atrial fibrillation.  I had a long discussion again with the patient this morning.  I think the chances of [place] finding something to do on the left from an endovascular standpoint that would significantly improve his chance of healing his wounds is very small.  This would be associated with some risk also.  I think the safest approach is likely a primary left above-the-knee amputation.  He would like to consider this further but thinks that he would like to proceed with an arteriogram on Monday.  I will discuss this with him again tomorrow.  If he wants to proceed with arteriography I could potentially do this Monday.  If he decides to proceed with a primary amputation we would likely do this Monday or Tuesday.   SUBJECTIVE:   No specific complaints this morning.  PHYSICAL EXAM:   Vitals:   08/17/18 0948 08/17/18 1355 08/17/18 2220 08/18/18 0559  BP:  (!) 106/92 (!) 105/56 116/67  Pulse:  74 66 (!) 58  Resp:   16 16  Temp:  (!) 97.4 F (36.3 C) 98.2 F (36.8 C) 98 F (36.7 C)  TempSrc:  Oral Oral Axillary  SpO2:  92% 100% 98%  Weight: 88.5 kg     Height: 5\' 11"  (1.803 m)      Significant wounds on the left foot and left leg.  He also has chronic venous insufficiency with venous stasis changes.  LABS:   Lab Results  Component Value Date   WBC 7.5 08/18/2018   HGB 9.1 (L) 08/18/2018   HCT 30.2 (L) 08/18/2018   MCV 98.4 08/18/2018   PLT 244 08/18/2018   Lab Results  Component Value Date   CREATININE 0.90 08/17/2018   Lab Results  Component Value Date   INR 3.08 04/20/2017    PROBLEM LIST:    Principal Problem:   Cellulitis and abscess of left leg Active Problems:   COPD  (chronic obstructive pulmonary disease) (HCC)   ESOPHAGEAL MOTILITY DISORDER   GERD   Essential hypertension, benign   Chronic pain syndrome   AF (paroxysmal atrial fibrillation) (HCC)   Hx of right BKA (HCC)   PAD (peripheral artery disease) (HCC)   CURRENT MEDS:   . aspirin EC  81 mg Oral Daily  . atorvastatin  20 mg Oral q1800  . carvedilol  6.25 mg Oral BID  . DULoxetine  30 mg Oral Daily  . furosemide  20 mg Oral BID  . Melatonin  6 mg Oral QHS  . mirtazapine  15 mg Oral QHS  . multivitamin with minerals  1 tablet Oral Daily    Waverly Ferrari Beeper: [phone] Office: [phone] 08/18/2018

## 2018-08-18 NOTE — Progress Notes (Signed)
PROGRESS NOTE    [name] [name]  INO:[id number] DOB: 1932-08-04 DOA: 08/17/2018 PCP: Merlyn Albert, MD  Brief Narrative: 85 year old male with COPD, chronic diastolic CHF, paroxysmal atrial fibrillation, achalasia, severe peripheral arterial disease status post right below-knee amputation, Severe debility wheelchair-bound nursing home resident for over past year, history of recurrent infections, MRSA bacteremia recently treated with daptomycin was brought to the vascular lab yesterday for possible arteriogram, subsequently found to have extensive wounds with gangrenous changes in his toes, admission per [facility] was requested, procedure was canceled it was felt that patient would likely need an amputation. -Patient's sons understand that left leg does not appear salvageable, patient still not convinced, vascular surgery following, considering BKA, +/-arteriogram  Assessment & Plan:   PAD with ischemic changes and left foot and gangrene of the toes -Vascular surgery following -Patient has gangrenous changes in 3 toes -Patient needs below-knee amputation, remains conflicted about this, family understands our recommendations -Not see evidence of active cellulitis, no fevers or leukocytosis -Hold off on antibiotics at this time -Also request palliative consult for goals of care due to advanced age severe debility is ongoing failure to thrive and severe PAD/gangrene  Chronic atrial fibrillation -Eliquis on hold, continue heparin bridge   COPD (chronic obstructive pulmonary disease) (HCC) -stable, nebs PRN   Chronic diastolic CHF -Appears slightly volume overloaded, increase Lasix dose, KCl    ESOPHAGEAL MOTILITY DISORDER/severe achalasia -Followed by GI at [facility], -Continue PPI, soft diet, not a candidate for Heller's myomectomy  Hypertension -Stable, continue home regimen  Hypokalemia -Replaced  DVT prophylaxis: Heparin infusion. Code Status:  DNR, per patient's son, was discussed and  addressed at SNF recently and patient wished to be DNR Family Communication: Patient's son at bedside. Disposition Plan: Unknown at this time.  Probably back to nursing home  Consultants:   Vascular surgery  Palliative medicine   Procedures:   Antimicrobials:    Subjective: -Weak frail, tired -Does not agree to amputation at this time yet, discussed with son at bedside  Objective: Vitals:   08/17/18 0948 08/17/18 1355 08/17/18 2220 08/18/18 0559  BP:  (!) 106/92 (!) 105/56 116/67  Pulse:  74 66 (!) 58  Resp:   16 16  Temp:  (!) 97.4 F (36.3 C) 98.2 F (36.8 C) 98 F (36.7 C)  TempSrc:  Oral Oral Axillary  SpO2:  92% 100% 98%  Weight: 88.5 kg     Height: 5\' 11"  (1.803 m)       Intake/Output Summary (Last 24 hours) at 08/18/2018 1121 Last data filed at 08/17/2018 2200 Gross per 24 hour  Intake 432.2 ml  Output 500 ml  Net -67.8 ml   Filed Weights   08/17/18 0948  Weight: 88.5 kg    Examination:  General exam: Frail, chronically ill-appearing male somnolent, arouses, oriented to self and place Respiratory system: Few scattered rhonchi Cardiovascular system: S1 & S2 heard, RRR. Gastrointestinal system: Abdomen is nondistended, soft and nontender.Normal bowel sounds heard. Central nervous system: Alert and oriented x2 Extremities: Right BKA, left leg with wet gangrenous changes involving second and third toe, no palpable pulse Skin: Skin make changes in the left foot as above Psychiatry: Flat affect    Data Reviewed:   CBC: Recent Labs  Lab 08/17/18 0940 08/18/18 0332  WBC  --  7.5  NEUTROABS  --  5.3  HGB 10.9* 9.1*  HCT 32.0* 30.2*  MCV  --  98.4  PLT  --  244   Basic Metabolic Panel:  Recent Labs  Lab 08/17/18 0940 08/18/18 0823  NA 138 138  K 3.0* 3.0*  CL  --  99  CO2  --  31  GLUCOSE 110* 88  BUN  --  7*  CREATININE 0.90 0.70  CALCIUM  --  8.0*   GFR: Estimated Creatinine Clearance: 74.5 mL/min (by C-G formula based on SCr of  0.7 mg/dL). Liver Function Tests: No results for input(s): AST, ALT, ALKPHOS, BILITOT, PROT, ALBUMIN in the last 168 hours. No results for input(s): LIPASE, AMYLASE in the last 168 hours. No results for input(s): AMMONIA in the last 168 hours. Coagulation Profile: No results for input(s): INR, PROTIME in the last 168 hours. Cardiac Enzymes: No results for input(s): CKTOTAL, CKMB, CKMBINDEX, TROPONINI in the last 168 hours. BNP (last 3 results) No results for input(s): PROBNP in the last 8760 hours. HbA1C: No results for input(s): HGBA1C in the last 72 hours. CBG: No results for input(s): GLUCAP in the last 168 hours. Lipid Profile: No results for input(s): CHOL, HDL, LDLCALC, TRIG, CHOLHDL, LDLDIRECT in the last 72 hours. Thyroid Function Tests: No results for input(s): TSH, T4TOTAL, FREET4, T3FREE, THYROIDAB in the last 72 hours. Anemia Panel: No results for input(s): VITAMINB12, FOLATE, FERRITIN, TIBC, IRON, RETICCTPCT in the last 72 hours. Urine analysis:    Component Value Date/Time   COLORURINE YELLOW 04/10/2017 0907   APPEARANCEUR HAZY (A) 04/10/2017 0907   LABSPEC 1.028 04/10/2017 0907   PHURINE 5.0 04/10/2017 0907   GLUCOSEU NEGATIVE 04/10/2017 0907   HGBUR MODERATE (A) 04/10/2017 0907   BILIRUBINUR NEGATIVE 04/10/2017 0907   KETONESUR 5 (A) 04/10/2017 0907   PROTEINUR 30 (A) 04/10/2017 0907   UROBILINOGEN 0.2 02/21/2011 1842   NITRITE NEGATIVE 04/10/2017 0907   LEUKOCYTESUR NEGATIVE 04/10/2017 0907   Sepsis Labs: @LABRCNTIP (procalcitonin:4,lacticidven:4)  ) Recent Results (from the past 240 hour(s))  Surgical pcr screen     Status: Abnormal   Collection Time: 08/17/18  4:15 PM  Result Value Ref Range Status   MRSA, PCR POSITIVE (A) NEGATIVE Final    Comment: RESULT CALLED TO, READ BACK BY AND VERIFIED WITH: L THOMPSON RN 08/17/18 1834 JDW    Staphylococcus aureus POSITIVE (A) NEGATIVE Final    Comment: (NOTE) The Xpert SA Assay (FDA approved for NASAL  specimens in patients 49 years of age and older), is one component of a comprehensive surveillance program. It is not intended to diagnose infection nor to guide or monitor treatment. Performed at [facility], [address], [place], [place] [address]          Radiology Studies: No results found.      Scheduled Meds: . aspirin EC  81 mg Oral Daily  . atorvastatin  20 mg Oral q1800  . carvedilol  6.25 mg Oral BID  . DULoxetine  30 mg Oral Daily  . furosemide  40 mg Oral BID  . Melatonin  6 mg Oral QHS  . mirtazapine  15 mg Oral QHS  . multivitamin with minerals  1 tablet Oral Daily  . potassium chloride  40 mEq Oral BID   Continuous Infusions: . heparin 1,000 Units/hr (08/18/18 0630)     LOS: 1 day    Time spent:    Zannie Cove, MD [facility]  08/18/2018, 11:21 AM

## 2018-08-18 NOTE — Progress Notes (Signed)
ANTICOAGULATION CONSULT NOTE   Pharmacy Consult for Heparin Indication: atrial fibrillation  Allergies  Allergen Reactions  . Percocet [Oxycodone-Acetaminophen] Other (See Comments)    Documented on MAR  . Sulfa Antibiotics Hives  . Sulfonamide Derivatives Hives  . Tramadol Itching and Other (See Comments)    Patient Measurements: Height: 5\' 11"  (180.3 cm) Weight: 195 lb 1.7 oz (88.5 kg) IBW/kg (Calculated) : 75.3 Heparin Dosing Weight:  88.5 kg  Vital Signs: Temp: 98.2 F (36.8 C) (02/21 2220) Temp Source: Oral (02/21 2220) BP: 105/56 (02/21 2220) Pulse Rate: 66 (02/21 2220)  Labs: Recent Labs    08/17/18 0940 08/17/18 2232 08/18/18 0332  HGB 10.9*  --  9.1*  HCT 32.0*  --  30.2*  PLT  --   --  244  APTT  --  185* 143*  HEPARINUNFRC  --  >2.20* 2.14*  CREATININE 0.90  --   --     Estimated Creatinine Clearance: 66.2 mL/min (by C-G formula based on SCr of 0.9 mg/dL).   Medical History: Past Medical History:  Diagnosis Date  . Achalasia   . Alcohol abuse    stop drinking since 01/2009  . Arthritis   . Asthma   . Atrial fibrillation (HCC)    off coumadin secondary to fall and subdural  hematoma   . B12 deficiency 9/10   started injection   . BPH (benign prostatic hyperplasia)   . Carcinoma in situ of bladder 2014   Dr. Annabell Howells  . Chronic kidney disease    RECENT UTI FEW MONTHS AGO TX WITH ANTIBIOTIC  . Compression fracture   . COPD (chronic obstructive pulmonary disease) (HCC)   . Diverticula of colon    few small scattered in the sigmoid colon  . Enlarged prostate   . External hemorrhoids 09/2014  . GERD (gastroesophageal reflux disease)   . H/O hiatal hernia   . Headache(784.0)   . Hypertension   . Nutcracker esophagus 2002   on EM  . Osteoporosis   . Peripheral edema   . Reflux   . Venous stasis    chronic     Assessment:  Afib on Eliquis PTA. Last dose 2/20 PM. Hgb 10.9. Hold Eliquis for possibly surgery and transition to IV  heparin. Initial heparin level >2.2 and aPTT 185 sec Redraw lab HL 2.14 and aPTT 143 sec  Goal of Therapy:  APTT 66-102 sec Heparin level 0.3-0.7 units/ml Monitor platelets by anticoagulation protocol: Yes   Plan:  Hold heparin for 1 hours Restart at 1000 units/hr Check heparin level and aPTT ~ 6 hours after restart  Thanks for allowing pharmacy to be a part of this patient's care.  Talbert Cage, PharmD Clinical Pharmacist

## 2018-08-19 DIAGNOSIS — T50905A Adverse effect of unspecified drugs, medicaments and biological substances, initial encounter: Secondary | ICD-10-CM

## 2018-08-19 DIAGNOSIS — L298 Other pruritus: Secondary | ICD-10-CM

## 2018-08-19 DIAGNOSIS — Z515 Encounter for palliative care: Secondary | ICD-10-CM

## 2018-08-19 LAB — BASIC METABOLIC PANEL
Anion gap: 7 (ref 5–15)
BUN: 9 mg/dL (ref 8–23)
CO2: 30 mmol/L (ref 22–32)
Calcium: 8.1 mg/dL — ABNORMAL LOW (ref 8.9–10.3)
Chloride: 100 mmol/L (ref 98–111)
Creatinine, Ser: 0.76 mg/dL (ref 0.61–1.24)
GFR calc Af Amer: >60 mL/min (ref >60)
GFR calc non Af Amer: >60 mL/min (ref >60)
Glucose, Bld: 95 mg/dL (ref 70–99)
Potassium: 4.1 mmol/L (ref 3.5–5.1)
Sodium: 137 mmol/L (ref 135–145)

## 2018-08-19 LAB — CBC
HCT: 29.6 % — ABNORMAL LOW (ref 39.0–52.0)
Hemoglobin: 9.1 g/dL — ABNORMAL LOW (ref 13.0–17.0)
MCH: 30.8 pg (ref 26.0–34.0)
MCHC: 30.7 g/dL (ref 30.0–36.0)
MCV: 100.3 fL — AB (ref 80.0–100.0)
PLATELETS: 241 10*3/uL (ref 150–400)
RBC: 2.95 MIL/uL — ABNORMAL LOW (ref 4.22–5.81)
RDW: 15 % (ref 11.5–15.5)
WBC: 7.3 10*3/uL (ref 4.0–10.5)
nRBC: 0 % (ref 0.0–0.2)

## 2018-08-19 LAB — HEPARIN LEVEL (UNFRACTIONATED): Heparin Unfractionated: 1.09 IU/mL — ABNORMAL HIGH (ref 0.30–0.70)

## 2018-08-19 LAB — APTT: aPTT: 97 seconds — ABNORMAL HIGH (ref 24–36)

## 2018-08-19 MED ORDER — POTASSIUM CHLORIDE CRYS ER 20 MEQ PO TBCR
40.0000 meq | EXTENDED_RELEASE_TABLET | Freq: Every day | ORAL | Status: DC
  Administered 2018-08-19 – 2018-08-20 (×2): 40 meq via ORAL
  Filled 2018-08-19 (×2): qty 2

## 2018-08-19 MED ORDER — FUROSEMIDE 40 MG PO TABS
40.0000 mg | ORAL_TABLET | Freq: Every day | ORAL | Status: DC
  Administered 2018-08-20: 40 mg via ORAL
  Filled 2018-08-19: qty 1

## 2018-08-19 MED ORDER — POLYETHYLENE GLYCOL 3350 17 G PO PACK
17.0000 g | PACK | Freq: Every day | ORAL | Status: DC
  Administered 2018-08-19 – 2018-08-20 (×2): 17 g via ORAL
  Filled 2018-08-19 (×2): qty 1

## 2018-08-19 MED ORDER — OXYCODONE HCL 5 MG PO TABS
5.0000 mg | ORAL_TABLET | ORAL | Status: DC | PRN
  Administered 2018-08-19 (×2): 5 mg via ORAL
  Filled 2018-08-19 (×2): qty 1

## 2018-08-19 MED ORDER — CAMPHOR-MENTHOL 0.5-0.5 % EX LOTN
TOPICAL_LOTION | CUTANEOUS | Status: DC | PRN
  Filled 2018-08-19: qty 222

## 2018-08-19 MED ORDER — HYDROXYZINE HCL 25 MG PO TABS: 25.0000 mg | ORAL_TABLET | Freq: Three times a day (TID) | ORAL | Status: DC | PRN

## 2018-08-19 NOTE — Progress Notes (Signed)
PROGRESS NOTE    [name] [name]  TKP:[id number] DOB: 02/24/1933 DOA: 08/17/2018 PCP: Merlyn Albert, MD  Brief Narrative: 85 year old male with COPD, chronic diastolic CHF, paroxysmal atrial fibrillation, achalasia, severe peripheral arterial disease status post right below-knee amputation, Severe debility wheelchair-bound nursing home resident for over past year, history of recurrent infections, MRSA bacteremia recently treated with daptomycin was brought to the vascular lab yesterday for possible arteriogram, subsequently found to have extensive wounds with gangrenous changes in his toes, admission per [facility] was requested, procedure was canceled it was felt that patient would likely need an amputation. -Patient's sons understand that left leg did not appear salvageable, patient still not convinced, vascular surgery following -Refusing BKA, palliative medicine consulted  Assessment & Plan:   PAD with ischemic changes and left foot and gangrene of the toes -Vascular surgery following -Patient has gangrenous changes in 3 toes -Below-knee amputation was recommended, patient continues to refuse this -Palliative consulted for goals of care, now leaning towards discharge to SNF with hospice -Discussed with patient again asked if he would be willing to reconsider BKA, leaning towards not, vascular following, Dr. Edilia Bo to re-eval again tomorrow, if he continues to refuse will plan to discharge back to SNF with hospice -No evidence of active infection at this time, did not start antibiotics -Continue to hold Eliquis for 1 more day  Chronic atrial fibrillation -Eliquis on hold, continue heparin bridge for 1 more day pending VVS discussion w/ pt tomorrow   COPD (chronic obstructive pulmonary disease) (HCC) -stable, nebs PRN   Chronic diastolic CHF -Appears close to euvolemic, continue oral Lasix    ESOPHAGEAL MOTILITY DISORDER/severe achalasia -Followed by GI at [facility], -Continue PPI, soft diet,  not a candidate for Heller's myomectomy  Hypertension -Stable, continue home regimen  Hypokalemia -Replaced  DVT prophylaxis: Heparin infusion. Code Status:  DNR Family Communication: Patient's son at bedside yesterday, none this morning Disposition Plan:  Likely back to SNF with hospice tomorrow if continues to decline BKA  Consultants:   Vascular surgery  Palliative medicine   Procedures:   Antimicrobials:    Subjective: -Sleepy, tired, pain controlled with current regimen -Still declines BKA  Objective: Vitals:   08/18/18 0559 08/18/18 2200 08/19/18 0537 08/19/18 0924  BP: 116/67 124/67 108/75   Pulse: (!) 58 74 72   Resp: 16 18 17    Temp: 98 F (36.7 C) 97.7 F (36.5 C) 97.6 F (36.4 C)   TempSrc: Axillary Oral Oral   SpO2: 98% 98% 100% 98%  Weight:      Height:        Intake/Output Summary (Last 24 hours) at 08/19/2018 1123 Last data filed at 08/19/2018 0900 Gross per 24 hour  Intake 568.97 ml  Output 300 ml  Net 268.97 ml   Filed Weights   08/17/18 0948  Weight: 88.5 kg    Examination:  Gen: Frail elderly chronically ill-appearing male, somnolent, but arousable, oriented to self and place   HEENT: PERRLA, Neck supple, no JVD Lungs: Few scattered rhonchi CVS: RRR,No Gallops,Rubs or new Murmurs Abd: soft, Non tender, non distended, BS present Extremities: Right BKA, left leg with ischemic changes involving foot, wet gangrene of 3 toes Skin: Make changes of left foot as above Psychiatry: Flat affect    Data Reviewed:   CBC: Recent Labs  Lab 08/17/18 0940 08/18/18 0332 08/19/18 0255  WBC  --  7.5 7.3  NEUTROABS  --  5.3  --   HGB 10.9* 9.1* 9.1*  HCT 32.0*  30.2* 29.6*  MCV  --  98.4 100.3*  PLT  --  244 241   Basic Metabolic Panel: Recent Labs  Lab 08/17/18 0940 08/18/18 0823 08/19/18 0255  NA 138 138 137  K 3.0* 3.0* 4.1  CL  --  99 100  CO2  --  31 30  GLUCOSE 110* 88 95  BUN  --  7* 9  CREATININE 0.90 0.70 0.76    CALCIUM  --  8.0* 8.1*   GFR: Estimated Creatinine Clearance: 74.5 mL/min (by C-G formula based on SCr of 0.76 mg/dL). Liver Function Tests: No results for input(s): AST, ALT, ALKPHOS, BILITOT, PROT, ALBUMIN in the last 168 hours. No results for input(s): LIPASE, AMYLASE in the last 168 hours. No results for input(s): AMMONIA in the last 168 hours. Coagulation Profile: No results for input(s): INR, PROTIME in the last 168 hours. Cardiac Enzymes: No results for input(s): CKTOTAL, CKMB, CKMBINDEX, TROPONINI in the last 168 hours. BNP (last 3 results) No results for input(s): PROBNP in the last 8760 hours. HbA1C: No results for input(s): HGBA1C in the last 72 hours. CBG: No results for input(s): GLUCAP in the last 168 hours. Lipid Profile: No results for input(s): CHOL, HDL, LDLCALC, TRIG, CHOLHDL, LDLDIRECT in the last 72 hours. Thyroid Function Tests: No results for input(s): TSH, T4TOTAL, FREET4, T3FREE, THYROIDAB in the last 72 hours. Anemia Panel: No results for input(s): VITAMINB12, FOLATE, FERRITIN, TIBC, IRON, RETICCTPCT in the last 72 hours. Urine analysis:    Component Value Date/Time   COLORURINE YELLOW 04/10/2017 0907   APPEARANCEUR HAZY (A) 04/10/2017 0907   LABSPEC 1.028 04/10/2017 0907   PHURINE 5.0 04/10/2017 0907   GLUCOSEU NEGATIVE 04/10/2017 0907   HGBUR MODERATE (A) 04/10/2017 0907   BILIRUBINUR NEGATIVE 04/10/2017 0907   KETONESUR 5 (A) 04/10/2017 0907   PROTEINUR 30 (A) 04/10/2017 0907   UROBILINOGEN 0.2 02/21/2011 1842   NITRITE NEGATIVE 04/10/2017 0907   LEUKOCYTESUR NEGATIVE 04/10/2017 0907   Sepsis Labs: (procalcitonin:4,lacticidven:4)  ) Recent Results (from the past 240 hour(s))  Surgical pcr screen     Status: Abnormal   Collection Time: 08/17/18  4:15 PM  Result Value Ref Range Status   MRSA, PCR POSITIVE (A) NEGATIVE Final    Comment: RESULT CALLED TO, READ BACK BY AND VERIFIED WITH: L THOMPSON RN 08/17/18 1834 JDW     Staphylococcus aureus POSITIVE (A) NEGATIVE Final    Comment: (NOTE) The Xpert SA Assay (FDA approved for NASAL specimens in patients 63 years of age and older), is one component of a comprehensive surveillance program. It is not intended to diagnose infection nor to guide or monitor treatment. Performed at [facility], [address], [place], [place] [address]          Radiology Studies: No results found.      Scheduled Meds: . albuterol  2.5 mg Nebulization BID  . aspirin EC  81 mg Oral Daily  . atorvastatin  20 mg Oral q1800  . carvedilol  6.25 mg Oral BID  . DULoxetine  30 mg Oral Daily  . Melatonin  6 mg Oral QHS  . mirtazapine  15 mg Oral QHS  . multivitamin with minerals  1 tablet Oral Daily  . oxyCODONE  10 mg Oral Q12H  . polyethylene glycol  17 g Oral Daily  . senna-docusate  2 tablet Oral QHS   Continuous Infusions: . heparin 900 Units/hr (08/19/18 0437)     LOS: 2 days    Time  spent:    Zannie Cove, MD [facility]  08/19/2018, 11:23 AM

## 2018-08-19 NOTE — Progress Notes (Signed)
ANTICOAGULATION CONSULT NOTE  Pharmacy Consult:  Heparin Indication: atrial fibrillation  Allergies  Allergen Reactions  . Percocet [Oxycodone-Acetaminophen] Other (See Comments)    Documented on MAR  . Sulfa Antibiotics Hives  . Sulfonamide Derivatives Hives  . Tramadol Itching and Other (See Comments)    Patient Measurements: Height: 5\' 11"  (180.3 cm) Weight: 195 lb 1.7 oz (88.5 kg) IBW/kg (Calculated) : 75.3 Heparin Dosing Weight:  88.5 kg  Vital Signs: Temp: 97.6 F (36.4 C) (02/23 0537) Temp Source: Oral (02/23 0537) BP: 108/75 (02/23 0537) Pulse Rate: 72 (02/23 0537)  Labs: Recent Labs    08/17/18 0940  08/18/18 0332 08/18/18 0823 08/18/18 1336 08/19/18 0255  HGB 10.9*  --  9.1*  --   --  9.1*  HCT 32.0*  --  30.2*  --   --  29.6*  PLT  --   --  244  --   --  241  APTT  --    < > 143*  --  107* 97*  HEPARINUNFRC  --    < > 2.14*  --  1.58* 1.09*  CREATININE 0.90  --   --  0.70  --  0.76   < > = values in this interval not displayed.    Estimated Creatinine Clearance: 74.5 mL/min (by C-G formula based on SCr of 0.76 mg/dL).   Assessment: 84 YOM with history of AFib on Eliquis PTA.  Patient presented with non-healing left leg wound with gangrenous toes and Vascular is recommending left AKA.  Pharmacy consulted to dose IV heparin while Eliquis is on hold.  Currently using aPTT to guide heparin dosing since Eliquis is falsely elevating heparin levels.  APTT therapeutic and high normal.  No bleeding reported.  Goal of Therapy:  Heparin level 0.3-0.7 units/ml  APTT 66 - 102 sec Monitor platelets by anticoagulation protocol: Yes    Plan:  Reduce heparin gtt slightly to 850 units/hr Daily heparin level, aPTT and CBC F/U surgery plans vs resuming Eliquis  Ronelle Smallman D. Laney Potash, PharmD, BCPS, BCCCP 08/19/2018, 10:10 AM

## 2018-08-19 NOTE — Progress Notes (Signed)
   VASCULAR SURGERY ASSESSMENT & PLAN:   The patient has decided that he does not want an arteriogram or a Left AKA. He has decided on palliative care. I explained to him that I think that he would heal a Left AKA, although surgery would be associated with some risk given his debilitated state. I will check back tomorrow, but it looks like we will not be involved in his care at this point.   SUBJECTIVE:   Has decided against any further procedures.  PHYSICAL EXAM:   Vitals:   08/17/18 2220 08/18/18 0559 08/18/18 2200 08/19/18 0537  BP: (!) 105/56 116/67 124/67 108/75  Pulse: 66 (!) 58 74 72  Resp: 16 16 18 17   Temp: 98.2 F (36.8 C) 98 F (36.7 C) 97.7 F (36.5 C) 97.6 F (36.4 C)  TempSrc: Oral Axillary Oral Oral  SpO2: 100% 98% 98% 100%  Weight:      Height:       No change to left leg wounds.   LABS:   Lab Results  Component Value Date   WBC 7.3 08/19/2018   HGB 9.1 (L) 08/19/2018   HCT 29.6 (L) 08/19/2018   MCV 100.3 (H) 08/19/2018   PLT 241 08/19/2018   Lab Results  Component Value Date   CREATININE 0.76 08/19/2018   Lab Results  Component Value Date   INR 3.08 04/20/2017   CBG (last 3)  No results for input(s): GLUCAP in the last 72 hours.  PROBLEM LIST:    Principal Problem:   Cellulitis and abscess of left leg Active Problems:   COPD (chronic obstructive pulmonary disease) (HCC)   ESOPHAGEAL MOTILITY DISORDER   GERD   Essential hypertension, benign   Chronic pain syndrome   AF (paroxysmal atrial fibrillation) (HCC)   Hx of right BKA (HCC)   PAD (peripheral artery disease) (HCC)   Palliative care encounter   Encounter for hospice care discussion   CURRENT MEDS:   . albuterol  2.5 mg Nebulization BID  . aspirin EC  81 mg Oral Daily  . atorvastatin  20 mg Oral q1800  . carvedilol  6.25 mg Oral BID  . DULoxetine  30 mg Oral Daily  . furosemide  40 mg Oral BID  . Melatonin  6 mg Oral QHS  . mirtazapine  15 mg Oral QHS  . multivitamin with  minerals  1 tablet Oral Daily  . oxyCODONE  10 mg Oral Q12H  . senna-docusate  2 tablet Oral QHS    Waverly Ferrari Beeper: [phone] Office: [phone] 08/19/2018

## 2018-08-19 NOTE — Progress Notes (Signed)
  Patient seen, chart reviewed.  No family at the bedside.  Patient was sleeping upon my arrival, but easily awakened and became conversant.  He states the addition of oxycodone extended release has helped his pain but he is still reporting the need for breakthrough medicine" can tell when it is wearing off".  He is also complaining of itching (this is not a new symptom since starting opioids)  Patient continues to decline amputation both to vascular surgeon per their note as well as to me.  His chief concern is to return to the nursing home to be with his wife who apparently has advanced dementia.  He states that he will be able to get around in a wheelchair and with pain management he will be able to cope  Plan  Continue with oxycodone extended release 10 mg twice daily.  Monitor and titrate for effect.  Anticipate that he will need higher doses We will change breakthrough oxycodone IR to every 4 PRN We will add Vistaril 25 mg 3 times a day as needed for urticaria We will add Sarna lotion as needed Patient is hopeful for discharge as soon as possible back to his skilled nursing facility with hospice support  Thank you, Eduard Roux, NP Time spent: 20 minutes Greater than 50% of time spent in counseling and coordination of care

## 2018-08-20 LAB — HEPARIN LEVEL (UNFRACTIONATED): Heparin Unfractionated: 0.77 IU/mL — ABNORMAL HIGH (ref 0.30–0.70)

## 2018-08-20 LAB — CBC
HCT: 30 % — ABNORMAL LOW (ref 39.0–52.0)
Hemoglobin: 9.1 g/dL — ABNORMAL LOW (ref 13.0–17.0)
MCH: 30.4 pg (ref 26.0–34.0)
MCHC: 30.3 g/dL (ref 30.0–36.0)
MCV: 100.3 fL — ABNORMAL HIGH (ref 80.0–100.0)
Platelets: 244 10*3/uL (ref 150–400)
RBC: 2.99 MIL/uL — ABNORMAL LOW (ref 4.22–5.81)
RDW: 15.3 % (ref 11.5–15.5)
WBC: 8.5 10*3/uL (ref 4.0–10.5)
nRBC: 0 % (ref 0.0–0.2)

## 2018-08-20 LAB — APTT: aPTT: 91 seconds — ABNORMAL HIGH (ref 24–36)

## 2018-08-20 MED ORDER — LORAZEPAM 0.5 MG PO TABS: 0.5000 mg | ORAL_TABLET | Freq: Two times a day (BID) | ORAL | 0 refills | Status: AC | PRN

## 2018-08-20 MED ORDER — SENNOSIDES-DOCUSATE SODIUM 8.6-50 MG PO TABS: 1.0000 | ORAL_TABLET | Freq: Two times a day (BID) | ORAL | Status: AC

## 2018-08-20 MED ORDER — APIXABAN 5 MG PO TABS
5.0000 mg | ORAL_TABLET | Freq: Two times a day (BID) | ORAL | Status: DC
  Administered 2018-08-20: 5 mg via ORAL
  Filled 2018-08-20: qty 1

## 2018-08-20 MED ORDER — OXYCODONE HCL ER 10 MG PO T12A: 10.0000 mg | EXTENDED_RELEASE_TABLET | Freq: Two times a day (BID) | ORAL | 0 refills | Status: AC

## 2018-08-20 MED ORDER — POLYETHYLENE GLYCOL 3350 17 G PO PACK: 17.0000 g | PACK | Freq: Every day | ORAL | 0 refills | Status: AC

## 2018-08-20 MED ORDER — OXYCODONE HCL 5 MG PO TABS: 5.0000 mg | ORAL_TABLET | ORAL | 0 refills | Status: AC | PRN

## 2018-08-20 MED ORDER — MUPIROCIN 2 % EX OINT
1.0000 "application " | TOPICAL_OINTMENT | Freq: Two times a day (BID) | CUTANEOUS | Status: DC
  Administered 2018-08-20: 1 via NASAL
  Filled 2018-08-20: qty 22

## 2018-08-20 MED ORDER — CHLORHEXIDINE GLUCONATE CLOTH 2 % EX PADS
6.0000 | MEDICATED_PAD | Freq: Every day | CUTANEOUS | Status: DC
  Administered 2018-08-20: 6 via TOPICAL

## 2018-08-20 NOTE — Progress Notes (Signed)
   VASCULAR SURGERY ASSESSMENT & PLAN:   Has decided on hospice.  He does not want an arteriogram and does not want an above-the-knee amputation.  Vascular surgery will be available as needed.   SUBJECTIVE:   No complaints this morning.  He feels comfortable with his decision to go to hospice.  He would like to be in a room with his wife.  PHYSICAL EXAM:   Vitals:   08/19/18 1547 08/19/18 2040 08/19/18 2154 08/20/18 0518  BP: (!) 104/59  108/64 (!) 106/54  Pulse: 85  (!) 54 74  Resp: 18  18 18   Temp: 97.6 F (36.4 C)  98.2 F (36.8 C) (!) 97.3 F (36.3 C)  TempSrc: Oral  Oral Oral  SpO2: 95% 96% 98% 96%  Weight:      Height:       No change in the wounds of the left leg.  LABS:    PROBLEM LIST:    Principal Problem:   Cellulitis and abscess of left leg Active Problems:   COPD (chronic obstructive pulmonary disease) (HCC)   ESOPHAGEAL MOTILITY DISORDER   GERD   Essential hypertension, benign   Chronic pain syndrome   AF (paroxysmal atrial fibrillation) (HCC)   Hx of right BKA (HCC)   PAD (peripheral artery disease) (HCC)   Palliative care encounter   Goals of care, counseling/discussion   Palliative care by specialist   Itching due to drug   CURRENT MEDS:   . albuterol  2.5 mg Nebulization BID  . aspirin EC  81 mg Oral Daily  . atorvastatin  20 mg Oral q1800  . carvedilol  6.25 mg Oral BID  . DULoxetine  30 mg Oral Daily  . furosemide  40 mg Oral Daily  . Melatonin  6 mg Oral QHS  . mirtazapine  15 mg Oral QHS  . multivitamin with minerals  1 tablet Oral Daily  . oxyCODONE  10 mg Oral Q12H  . polyethylene glycol  17 g Oral Daily  . potassium chloride  40 mEq Oral Daily  . senna-docusate  2 tablet Oral QHS    Waverly Ferrari Beeper: [phone] Office: [phone] 08/20/2018

## 2018-08-20 NOTE — Social Work (Signed)
CSW acknowledging note from Dr. Edilia Bo this morning. Pt from [facility]. Spoke with [name], admissions liaison at SNF- pt is able to return with hospice care and they will work on moving him into the same space as his wife.  CSW will meet with pt to arrange hospice at [facility].  Octavio Graves, MSW, [facility] Clinical Social Work [phone]

## 2018-08-20 NOTE — Social Work (Signed)
Clinical Social Worker facilitated patient discharge including contacting patient family and facility to confirm patient discharge plans.  Clinical information faxed to facility and family agreeable with plan.  CSW arranged ambulance transport via [facility] to [facility] RN to call [phone]  with report prior to discharge.  Clinical Social Worker will sign off for now as social work intervention is no longer needed. Please consult [place] again if new need arises.  Octavio Graves, MSW, [facility] Clinical Social Worker [phone]

## 2018-08-20 NOTE — Discharge Summary (Signed)
Physician Discharge Summary  [name] [name] WFU:[id number] DOB: 11-19-1932 DOA: 08/17/2018  PCP: Merlyn Albert, MD  Admit date: 08/17/2018 Discharge date: 08/20/2018  Time spent: 45 minutes  Recommendations for Outpatient Follow-up:  Discharged to SNF with hospice services for comfort focused care  Discharge Diagnoses:     Peripheral arterial disease   Ischemic left foot with wet gangrene of toes   History of right below-knee amputation   Achalasia   Severe protein calorie malnutrition   Adult failure to thrive   COPD (chronic obstructive pulmonary disease) (HCC) Chronic diastolic CHF   ESOPHAGEAL MOTILITY DISORDER   GERD   Essential hypertension, benign   Chronic pain syndrome   AF (paroxysmal atrial fibrillation) (HCC)   Hx of right BKA (HCC)   PAD (peripheral artery disease) (HCC)   Palliative care encounter   Goals of care, counseling/discussion   Palliative care by specialist   Itching due to drug   Discharge Condition: Stable  Diet recommendation: Heart healthy  Filed Weights   08/17/18 0948  Weight: 88.5 kg    History of present illness:  85 year old male with COPD, chronic diastolic CHF, paroxysmal atrial fibrillation, achalasia, severe peripheral arterial disease status post right below-knee amputation, Severe debility wheelchair-bound nursing home resident for over past year, history of recurrent infections, MRSA bacteremia recently treated with daptomycin was brought to the vascular lab by vascular surgery for possible arteriogram, subsequently found to have extensive wounds with gangrenous changes in his toes, admission per [facility] was requested  Hospital Course:   PAD with ischemic changes and left foot and gangrene of the toes -Vascular surgery Dr. Durwin Nora followed the patient from admission, has severe advanced PAD with ischemic changes in his left lower leg/foot and wet gangrene of 3 toes -Below-knee amputation was recommended, patient adamantly refused this on  numerous occasions -Subsequently had a palliative care consult for goals of care -Underwent family meeting with palliative medicine, patient along with his sons elected to discharge back to SNF with hospice care -He was started on long-acting OxyContin twice daily along with as needed oxycodone and laxatives for pain control/comfort focused care  Chronic atrial fibrillation -Stable, beta-blocker and Eliquis resumed   COPD (chronic obstructive pulmonary disease) (HCC) -stable, nebs PRN   Chronic diastolic CHF -Appears close to euvolemic, continue oral Lasix    ESOPHAGEAL MOTILITY DISORDER/severe achalasia -Followed by GI at [facility], -Continue PPI, soft diet, not a candidate for Heller's myomectomy  Hypertension -Stable, continue home regimen  Hypokalemia -Replaced  Code Status: DNR  Consultants:   Vascular surgery  Palliative medicine   Discharge Exam: Vitals:   08/19/18 2154 08/20/18 0518  BP: 108/64 (!) 106/54  Pulse: (!) 54 74  Resp: 18 18  Temp: 98.2 F (36.8 C) (!) 97.3 F (36.3 C)  SpO2: 98% 96%    General: Chronically ill-appearing, somnolent but arousable oriented to self and place Cardiovascular: S1-S2/regular rate rhythm Respiratory: Clear bilaterally  Discharge Instructions   Discharge Instructions    Diet - low sodium heart healthy   Complete by:  As directed    Increase activity slowly   Complete by:  As directed      Allergies as of 08/20/2018      Reactions   Percocet [oxycodone-acetaminophen] Other (See Comments)   Documented on MAR   Sulfa Antibiotics Hives   Sulfonamide Derivatives Hives   Tramadol Itching, Other (See Comments)      Medication List    STOP taking these medications   amoxicillin-clavulanate 875-125  MG tablet Commonly known as:  AUGMENTIN   vitamin C 500 MG tablet Commonly known as:  ASCORBIC ACID   zinc gluconate 50 MG tablet     TAKE these medications   acetaminophen 325 MG tablet Commonly  known as:  TYLENOL Take 650 mg by mouth every 6 (six) hours as needed for mild pain.   aspirin EC 81 MG tablet Take 81 mg by mouth daily.   atorvastatin 20 MG tablet Commonly known as:  LIPITOR Take 20 mg by mouth daily at 6 PM.   carvedilol 6.25 MG tablet Commonly known as:  COREG Take 6.25 mg by mouth 2 (two) times daily.   DULoxetine 30 MG capsule Commonly known as:  CYMBALTA Take 30 mg by mouth daily.   ELIQUIS 5 MG Tabs tablet Generic drug:  apixaban Take 5 mg by mouth 2 (two) times daily.   furosemide 20 MG tablet Commonly known as:  LASIX Take 20 mg by mouth 2 (two) times daily.   LORazepam 0.5 MG tablet Commonly known as:  ATIVAN Take 1 tablet (0.5 mg total) by mouth 2 (two) times daily as needed for anxiety.   Melatonin 5 MG Tabs Take 5 mg by mouth at bedtime.   mirtazapine 15 MG tablet Commonly known as:  REMERON Take 15 mg by mouth at bedtime.   multivitamin with minerals Tabs tablet Take 1 tablet by mouth daily.   oxyCODONE 5 MG immediate release tablet Commonly known as:  Oxy IR/ROXICODONE Take 1 tablet (5 mg total) by mouth every 4 (four) hours as needed for severe pain. What changed:  when to take this   oxyCODONE 10 mg 12 hr tablet Commonly known as:  OXYCONTIN Take 1 tablet (10 mg total) by mouth every 12 (twelve) hours. What changed:  You were already taking a medication with the same name, and this prescription was added. Make sure you understand how and when to take each.   polyethylene glycol packet Commonly known as:  MIRALAX / GLYCOLAX Take 17 g by mouth daily. Start taking on:  August 21, 2018   potassium chloride 10 MEQ tablet Commonly known as:  K-DUR,KLOR-CON Take 10 mEq by mouth daily.   senna-docusate 8.6-50 MG tablet Commonly known as:  Senokot-S Take 1 tablet by mouth 2 (two) times daily.   XOPENEX 0.63 MG/3ML nebulizer solution Generic drug:  levalbuterol Take 0.63 mg by nebulization every 6 (six) hours as needed for  wheezing or shortness of breath.      Allergies  Allergen Reactions  . Percocet [Oxycodone-Acetaminophen] Other (See Comments)    Documented on MAR  . Sulfa Antibiotics Hives  . Sulfonamide Derivatives Hives  . Tramadol Itching and Other (See Comments)   Follow-up Information    Merlyn Albert, MD Follow up in 1 week(s).   Specialty:  Family Medicine Contact information: [address] [place] [address] [phone]            The results of significant diagnostics from this hospitalization (including imaging, microbiology, ancillary and laboratory) are listed below for reference.    Significant Diagnostic Studies: Vas [place] Abi With/wo Tbi  Result Date: 08/01/2018 LOWER EXTREMITY DOPPLER STUDY Indications: Ulceration, and peripheral artery disease. High Risk Factors: Hypertension, no history of smoking.  Vascular Interventions: Right BKA. Limitations: Exam performed with patient erect in wheelchair Comparison Study: 06/13/2017 Performing Technologist: Priscella Mann BA, RVT, RDMS  Examination Guidelines: A complete evaluation includes at minimum, Doppler waveform signals and systolic blood pressure reading  at the level of bilateral brachial, anterior tibial, and posterior tibial arteries, when vessel segments are accessible. Bilateral testing is considered an integral part of a complete examination. Photoelectric Plethysmograph (PPG) waveforms and toe systolic pressure readings are included as required and additional duplex testing as needed. Limited examinations for reoccurring indications may be performed as noted.  ABI Findings: +--------+------------------+-----+---------+--------+ Right   Rt Pressure (mmHg)IndexWaveform Comment  +--------+------------------+-----+---------+--------+ [id number]                    triphasic         +--------+------------------+-----+---------+--------+  +---------+-----------------+-----+------------------+-------------------------+ Left     Lt Pressure      IndexWaveform          Comment                            (mmHg)                                                            +---------+-----------------+-----+------------------+-------------------------+ Brachial 116                   triphasic                                   +---------+-----------------+-----+------------------+-------------------------+ PTA      99               0.84 dampened                                                                   monophasic                                  +---------+-----------------+-----+------------------+-------------------------+ DP       117              0.99 dampened                                                                   monophasic                                  +---------+-----------------+-----+------------------+-------------------------+ Great Toe                                        Not performed due to  ulcer                     +---------+-----------------+-----+------------------+-------------------------+ +-------+-----------+-----------+------------+------------+ ABI/TBIToday's ABIToday's TBIPrevious ABIPrevious TBI +-------+-----------+-----------+------------+------------+ Right  BKA        BKA        BKA         BKA          +-------+-----------+-----------+------------+------------+ Left   0.99       N/A        0.77        0.22         +-------+-----------+-----------+------------+------------+ Arterial wall calcification precludes accurate ankle pressures and ABIs.  Summary: Left: Resting left ankle-brachial index is within normal range. No evidence of significant left lower extremity arterial disease. ABIs are unreliable. Although ankle brachial indices are within normal limits (0.95-1.29), arterial  Doppler waveforms at the ankle suggest some component of arterial occlusive disease.  *See table(s) above for measurements and observations.  Electronically signed by Waverly Ferrari MD on 08/01/2018 at 4:56:21 PM.    Final     Microbiology: Recent Results (from the past 240 hour(s))  Surgical pcr screen     Status: Abnormal   Collection Time: 08/17/18  4:15 PM  Result Value Ref Range Status   MRSA, PCR POSITIVE (A) NEGATIVE Final    Comment: RESULT CALLED TO, READ BACK BY AND VERIFIED WITH: L THOMPSON RN 08/17/18 1834 JDW    Staphylococcus aureus POSITIVE (A) NEGATIVE Final    Comment: (NOTE) The Xpert SA Assay (FDA approved for NASAL specimens in patients 83 years of age and older), is one component of a comprehensive surveillance program. It is not intended to diagnose infection nor to guide or monitor treatment. Performed at [facility], [address] [place], [place] [address]      Labs: Basic Metabolic Panel: Recent Labs  Lab 08/17/18 0940 08/18/18 0823 08/19/18 0255  NA 138 138 137  K 3.0* 3.0* 4.1  CL  --  99 100  CO2  --  31 30  GLUCOSE 110* 88 95  BUN  --  7* 9  CREATININE 0.90 0.70 0.76  CALCIUM  --  8.0* 8.1*   Liver Function Tests: No results for input(s): AST, ALT, ALKPHOS, BILITOT, PROT, ALBUMIN in the last 168 hours. No results for input(s): LIPASE, AMYLASE in the last 168 hours. No results for input(s): AMMONIA in the last 168 hours. CBC: Recent Labs  Lab 08/17/18 0940 08/18/18 0332 08/19/18 0255 08/20/18 0430  WBC  --  7.5 7.3 8.5  NEUTROABS  --  5.3  --   --   HGB 10.9* 9.1* 9.1* 9.1*  HCT 32.0* 30.2* 29.6* 30.0*  MCV  --  98.4 100.3* 100.3*  PLT  --  244 241 244   Cardiac Enzymes: No results for input(s): CKTOTAL, CKMB, CKMBINDEX, TROPONINI in the last 168 hours. BNP: BNP (last 3 results) No results for input(s): BNP in the last 8760 hours.  ProBNP (last 3 results) No results for input(s): PROBNP in the last 8760  hours.  CBG: No results for input(s): GLUCAP in the last 168 hours.     Signed:  Zannie Cove MD.  [facility] 08/20/2018, 11:30 AM

## 2018-08-20 NOTE — NC FL2 (Addendum)
[place] MEDICAID [id number] LEVEL OF CARE SCREENING TOOL     IDENTIFICATION  Patient Name: [name] [name] Birthdate: 1932/11/22 Sex: male Admission Date (Current Location): 08/17/2018  [facility] and [place] Number:  Producer, television/film/video and Address:  [facility] [place], [address], [place], [place] [address]      Provider Number: [id number]  Attending Physician Name and Address:  Zannie Cove, MD  Relative Name and Phone Number:  Latham Saint; son; [phone]    Current Level of Care: Hospital Recommended Level of Care: Skilled Nursing Facility Prior Approval Number:    Date Approved/Denied:   PASRR Number: [id number]  Discharge Plan: SNF    Current Diagnoses: Patient Active Problem List   Diagnosis Date Noted  . Palliative care by specialist   . Itching due to drug   . Palliative care encounter   . Goals of care, counseling/discussion   . PAD (peripheral artery disease) (HCC) 08/17/2018  . Cellulitis and abscess of left leg 08/17/2018  . Sepsis due to methicillin resistant Staphylococcus aureus (MRSA) (HCC) 04/18/2017  . Proteus septicemia (HCC) 04/18/2017  . Hypokalemia 04/15/2017  . Anemia 04/15/2017  . Hx of right BKA (HCC) 04/10/2017  . Gangrene of toe of right foot (HCC) 04/10/2017  . DVT (deep venous thrombosis) (HCC) 11/17/2016  . Pressure injury of skin 10/06/2016  . Acute diastolic CHF (congestive heart failure) (HCC) 10/06/2016  . Left leg DVT (HCC) 10/04/2016  . CAP (community acquired pneumonia) 10/04/2016  . AF (paroxysmal atrial fibrillation) (HCC) 10/04/2016  . Acute respiratory failure with hypoxia (HCC) 10/04/2016  . Depression, major, single episode, moderate (HCC) 08/28/2016  . Achalasia 12/09/2015  . Atherosclerosis of native arteries of extremity with rest pain (HCC) 10/21/2015  . Acute esophagitis   . Congestive heart failure, unspecified 01/01/2014  . Chronic pain syndrome 10/10/2013  . S/P total hip arthroplasty 05/14/2013   . Osteoporosis, unspecified 04/23/2013  . Essential hypertension, benign 09/19/2012  . Status post hip replacement 04/12/2012  . Spinal stenosis of lumbar region 05/03/2011  . TOTAL HIP FOLLOW-UP 09/13/2010  . NONUNION OF FRACTURE 07/08/2010  . HIP FRACTURE, RIGHT 07/08/2010  . ALCOHOL ABUSE 05/15/2009  . ESOPHAGEAL MOTILITY DISORDER 05/15/2009  . GERD 05/15/2009  . Constipation 05/15/2009  . WEIGHT LOSS, RECENT 05/15/2009  . EXTERNAL HEMORRHOIDS 05/13/2009  . COPD (chronic obstructive pulmonary disease) (HCC) 05/13/2009  . Dysphagia 05/13/2009  . COMPRESSION FRACTURE, SPINE 03/19/2009  . HIGH BLOOD PRESSURE 03/19/2009    Orientation RESPIRATION BLADDER Height & Weight     Situation, Place, Time, Self  O2(intermittant nasal canula) Incontinent, External catheter Weight: 195 lb 1.7 oz (88.5 kg) Height:  5\' 11"  (180.3 cm)  BEHAVIORAL SYMPTOMS/MOOD NEUROLOGICAL BOWEL NUTRITION STATUS      Incontinent Diet (see discharge summary)  AMBULATORY STATUS COMMUNICATION OF NEEDS Skin   Total Care Verbally  Surgical wounds; Other (previous right BKA; cellulitis on left leg; ecchymosis)                     Personal Care Assistance Level of Assistance  Bathing, Feeding, Dressing Bathing Assistance: Maximum assistance Feeding assistance: Maximum assistance Dressing Assistance: Maximum assistance     Functional Limitations Info  Hearing, Sight, Speech Sight Info: Adequate Hearing Info: Adequate Speech Info: Adequate    SPECIAL CARE FACTORS FREQUENCY  PT (By licensed PT), OT (By licensed OT)     PT Frequency: 5x week OT Frequency: 5x week  Contractures Contractures Info: Not present    Additional Factors Info  Code Status, Allergies, Psychotropic, Isolation Precautions Code Status Info: DNR Allergies Info: PERCOCET OXYCODONE-ACETAMINOPHEN, SULFA ANTIBIOTICS, SULFONAMIDE DERIVATIVES, TRAMADOL  Psychotropic Info: DULoxetine (CYMBALTA) DR capsule 30 mg daily PO;  mirtazapine (REMERON) tablet 15 mg daily at bedtime PO   Isolation Precautions Info: contact precautions MRSA     Current Medications (08/20/2018):  This is the current hospital active medication list Current Facility-Administered Medications  Medication Dose Route Frequency Provider Last Rate Last Dose  . acetaminophen (TYLENOL) tablet 650 mg  650 mg Oral Q6H PRN Dorcas Carrow, MD   650 mg at 08/20/18 0114  . albuterol (PROVENTIL) (2.5 MG/3ML) 0.083% nebulizer solution 2.5 mg  2.5 mg Nebulization Q2H PRN Dorcas Carrow, MD   2.5 mg at 08/17/18 1615  . apixaban (ELIQUIS) tablet 5 mg  5 mg Oral BID Zannie Cove, MD   5 mg at 08/20/18 1011  . aspirin EC tablet 81 mg  81 mg Oral Daily Dorcas Carrow, MD   81 mg at 08/20/18 4098  . atorvastatin (LIPITOR) tablet 20 mg  20 mg Oral q1800 Dorcas Carrow, MD   20 mg at 08/19/18 1819  . camphor-menthol (SARNA) lotion   Topical PRN Bullard, Ronaldo Miyamoto, NP      . carvedilol (COREG) tablet 6.25 mg  6.25 mg Oral BID Dorcas Carrow, MD   6.25 mg at 08/20/18 0948  . Chlorhexidine Gluconate Cloth 2 % PADS 6 each  6 each Topical Q0600 Zannie Cove, MD      . DULoxetine (CYMBALTA) DR capsule 30 mg  30 mg Oral Daily Dorcas Carrow, MD   30 mg at 08/20/18 1191  . furosemide (LASIX) tablet 40 mg  40 mg Oral Daily Zannie Cove, MD   40 mg at 08/20/18 4782  . hydrOXYzine (ATARAX/VISTARIL) tablet 25 mg  25 mg Oral TID PRN Bullard, Ronaldo Miyamoto, NP      . LORazepam (ATIVAN) tablet 0.5 mg  0.5 mg Oral BID PRN Dorcas Carrow, MD      . Melatonin TABS 6 mg  6 mg Oral QHS Dorcas Carrow, MD   6 mg at 08/20/18 0119  . mirtazapine (REMERON) tablet 15 mg  15 mg Oral QHS Dorcas Carrow, MD   15 mg at 08/19/18 2131  . multivitamin with minerals tablet 1 tablet  1 tablet Oral Daily Dorcas Carrow, MD   1 tablet at 08/20/18 0947  . mupirocin ointment (BACTROBAN) 2 % 1 application  1 application Nasal BID Zannie Cove, MD   1 application at 08/20/18 1011  . oxyCODONE  (Oxy IR/ROXICODONE) immediate release tablet 5 mg  5 mg Oral Q4H PRN Bullard, Ronaldo Miyamoto, NP   5 mg at 08/19/18 1819  . oxyCODONE (OXYCONTIN) 12 hr tablet 10 mg  10 mg Oral Q12H Dellinger, Marianne L, PA-C   10 mg at 08/20/18 0955  . polyethylene glycol (MIRALAX / GLYCOLAX) packet 17 g  17 g Oral Daily Zannie Cove, MD   17 g at 08/20/18 0945  . potassium chloride SA (K-DUR,KLOR-CON) CR tablet 40 mEq  40 mEq Oral Daily Zannie Cove, MD   40 mEq at 08/20/18 1010  . senna-docusate (Senokot-S) tablet 2 tablet  2 tablet Oral QHS Dellinger, Tora Kindred, PA-C   2 tablet at 08/19/18 2131     Discharge Medications: Please see discharge summary for a list of discharge medications.  Relevant Imaging Results:  Relevant Lab Results:   Additional Information SS#[id number]; would  like to return with hospice services through [facility], [place]

## 2018-08-20 NOTE — Clinical Social Work Placement (Signed)
   CLINICAL SOCIAL WORK PLACEMENT  NOTE [facility] RN to call report to [phone]  Date:  08/20/2018  Patient Details  Name: [name] [name] MRN: [id number] Date of Birth: Jun 07, 1933  Clinical Social Work is seeking post-discharge placement for this patient at the Skilled  Nursing Facility level of care (*CSW will initial, date and re-position this form in  chart as items are completed):  Yes   Patient/family provided with [facility] Clinical Social Work Department's list of facilities offering this level of care within the geographic area requested by the patient (or if unable, by the patient's family).  Yes   Patient/family informed of their freedom to choose among providers that offer the needed level of care, that participate in Medicare, Medicaid or managed care program needed by the patient, have an available bed and are willing to accept the patient.  Yes   Patient/family informed of [facility]'s ownership interest in [facility] and [facility], as well as of the fact that they are under no obligation to receive care at these facilities.  PASRR submitted to EDS on       PASRR number received on       Existing PASRR number confirmed on 08/20/18     FL2 transmitted to all facilities in geographic area requested by pt/family on 08/20/18     FL2 transmitted to all facilities within larger geographic area on       Patient informed that his/her managed care company has contracts with or will negotiate with certain facilities, including the following:        Yes   Patient/family informed of bed offers received.  Patient chooses bed at [facility]     Physician recommends and patient chooses bed at      Patient to be transferred to [facility] on 08/20/18.  Patient to be transferred to facility by [facility]     Patient family notified on 08/20/18 of transfer.  Name of family member notified:  pt son [name]      PHYSICIAN Please prepare priority discharge summary, including medications, Please prepare prescriptions, Please sign DNR     Additional Comment:    _______________________________________________ Doy Hutching, LCSWA 08/20/2018, 11:49 AM

## 2018-08-20 NOTE — Consult Note (Signed)
   [facility] CM Inpatient Consult   08/20/2018  [name] [name] July 07, 1932 [id number]  Patient screened hx with [facility] Care Management services are needed . Patient went to [facility] noted and  Reviewed. No [facility] follow up needed.   For questions contact:   Charlesetta Shanks, RN BSN CCM [facility]  [phone] business mobile phone Toll free office [phone]

## 2018-08-20 NOTE — Discharge Instructions (Signed)

## 2018-08-20 NOTE — Clinical Social Work Note (Signed)
Clinical Social Work Assessment  Patient Details  Name: [name] [name] MRN: [id number] Date of Birth: 06/10/1933  Date of referral:  08/20/18               Reason for consult:  Facility Placement, Discharge Planning                Permission sought to share information with:  Family Supports, Facility Contact Representative Permission granted to share information::  Yes, Verbal Permission Granted  Name::      [name] Manwarren  Agency::   [facility]  Relationship::   son  Contact Information:   [phone]  Housing/Transportation Living arrangements for the past 2 months:  Skilled Nursing Facility Source of Information:  Patient Patient Interpreter Needed:  None Criminal Activity/Legal Involvement Pertinent to Current Situation/Hospitalization:  No - Comment as needed Significant Relationships:  Adult Children, Community Support, Spouse Lives with:  Facility Resident Do you feel safe going back to the place where you live?  Yes Need for family participation in patient care:  Yes (Comment)  Care giving concerns:  Pt refusing amputation, he would like to return to SNF with his wife and have hospice. Pt son in agreement.   Social Worker assessment / plan:  CSW met with pt at bedside, pt from [facility]. Introduced self, role, and reason for visit. Pt able to state he would like to return to his SNF with his wife. Pt given choice, in agreement with referral to [facility]. Pt gave permission for CSW to call pt son [name]. Pt understands that CSW cannot guarantee pt is in same room as his spouse but has requested that with [facility].  CSW called [name], he states understanding of the plan. He is in agreement with hospice services and states he thinks pt had already been screened for those services but was not sure if they had started. He will come by hospital and bring clothing and take pt wheelchair back to SNF.   CSW will fax referral to [facility] and facilitate  discharge back per MD arrangements today.   Employment status:  Retired Insurance information:  Medicare PT Recommendations:  Not assessed at this time Information / Referral to community resources:  Skilled Nursing Facility  Patient/Family's Response to care:  Pt requests return to SNF to be with his wife, he was amenable to speaking with CSW and gave permission for his son to be called as well.  Patient/Family's Understanding of and Emotional Response to Diagnosis, Current Treatment, and Prognosis:  *Pt states understanding of his diagnosis, current treatment and prognosis. He desires to be back with his wife at SNF and with hospice services. Pt family in agreement. Pt son and pt both emotionally appropriate given circumstances. Appear happy with care here at hospital.  Emotional Assessment Appearance:  Appears stated age Attitude/Demeanor/Rapport:  Gracious, Engaged Affect (typically observed):  Accepting, Adaptable, Appropriate, Calm Orientation:  Oriented to  Time, Oriented to Place, Oriented to Situation, Oriented to Self Alcohol / Substance use:  Not Applicable Psych involvement (Current and /or in the community):  No (Comment)  Discharge Needs  Concerns to be addressed:  Care Coordination Readmission within the last 30 days:  No Current discharge risk:  Dependent with Mobility, Physical Impairment Barriers to Discharge:  Continued Medical Work up    H , LCSWA 08/20/2018, 9:50 AM  

## 2018-08-21 DIAGNOSIS — J449 Chronic obstructive pulmonary disease, unspecified: Secondary | ICD-10-CM | POA: Diagnosis not present

## 2018-08-21 DIAGNOSIS — N189 Chronic kidney disease, unspecified: Secondary | ICD-10-CM | POA: Diagnosis not present

## 2018-08-21 DIAGNOSIS — R52 Pain, unspecified: Secondary | ICD-10-CM | POA: Diagnosis not present

## 2018-08-21 DIAGNOSIS — I509 Heart failure, unspecified: Secondary | ICD-10-CM | POA: Diagnosis not present

## 2018-08-21 DIAGNOSIS — R0602 Shortness of breath: Secondary | ICD-10-CM | POA: Diagnosis not present

## 2018-08-21 DIAGNOSIS — N400 Enlarged prostate without lower urinary tract symptoms: Secondary | ICD-10-CM | POA: Diagnosis not present

## 2018-08-21 DIAGNOSIS — K219 Gastro-esophageal reflux disease without esophagitis: Secondary | ICD-10-CM | POA: Diagnosis not present

## 2018-08-21 DIAGNOSIS — I4891 Unspecified atrial fibrillation: Secondary | ICD-10-CM | POA: Diagnosis not present

## 2018-08-21 DIAGNOSIS — I70262 Atherosclerosis of native arteries of extremities with gangrene, left leg: Secondary | ICD-10-CM | POA: Diagnosis not present

## 2018-08-21 DIAGNOSIS — R531 Weakness: Secondary | ICD-10-CM | POA: Diagnosis not present

## 2018-08-21 DIAGNOSIS — I10 Essential (primary) hypertension: Secondary | ICD-10-CM | POA: Diagnosis not present

## 2018-08-22 DIAGNOSIS — J449 Chronic obstructive pulmonary disease, unspecified: Secondary | ICD-10-CM | POA: Diagnosis not present

## 2018-08-22 DIAGNOSIS — K219 Gastro-esophageal reflux disease without esophagitis: Secondary | ICD-10-CM | POA: Diagnosis not present

## 2018-08-22 DIAGNOSIS — I4891 Unspecified atrial fibrillation: Secondary | ICD-10-CM | POA: Diagnosis not present

## 2018-08-22 DIAGNOSIS — I509 Heart failure, unspecified: Secondary | ICD-10-CM | POA: Diagnosis not present

## 2018-08-22 DIAGNOSIS — I10 Essential (primary) hypertension: Secondary | ICD-10-CM | POA: Diagnosis not present

## 2018-08-22 DIAGNOSIS — I70262 Atherosclerosis of native arteries of extremities with gangrene, left leg: Secondary | ICD-10-CM | POA: Diagnosis not present

## 2018-08-23 DIAGNOSIS — K219 Gastro-esophageal reflux disease without esophagitis: Secondary | ICD-10-CM | POA: Diagnosis not present

## 2018-08-23 DIAGNOSIS — J449 Chronic obstructive pulmonary disease, unspecified: Secondary | ICD-10-CM | POA: Diagnosis not present

## 2018-08-23 DIAGNOSIS — I10 Essential (primary) hypertension: Secondary | ICD-10-CM | POA: Diagnosis not present

## 2018-08-23 DIAGNOSIS — I70262 Atherosclerosis of native arteries of extremities with gangrene, left leg: Secondary | ICD-10-CM | POA: Diagnosis not present

## 2018-08-23 DIAGNOSIS — I4891 Unspecified atrial fibrillation: Secondary | ICD-10-CM | POA: Diagnosis not present

## 2018-08-23 DIAGNOSIS — I509 Heart failure, unspecified: Secondary | ICD-10-CM | POA: Diagnosis not present

## 2018-08-24 DIAGNOSIS — K219 Gastro-esophageal reflux disease without esophagitis: Secondary | ICD-10-CM | POA: Diagnosis not present

## 2018-08-24 DIAGNOSIS — J449 Chronic obstructive pulmonary disease, unspecified: Secondary | ICD-10-CM | POA: Diagnosis not present

## 2018-08-24 DIAGNOSIS — I10 Essential (primary) hypertension: Secondary | ICD-10-CM | POA: Diagnosis not present

## 2018-08-24 DIAGNOSIS — I509 Heart failure, unspecified: Secondary | ICD-10-CM | POA: Diagnosis not present

## 2018-08-24 DIAGNOSIS — I70262 Atherosclerosis of native arteries of extremities with gangrene, left leg: Secondary | ICD-10-CM | POA: Diagnosis not present

## 2018-08-24 DIAGNOSIS — I4891 Unspecified atrial fibrillation: Secondary | ICD-10-CM | POA: Diagnosis not present

## 2018-08-25 DIAGNOSIS — I4891 Unspecified atrial fibrillation: Secondary | ICD-10-CM | POA: Diagnosis not present

## 2018-08-25 DIAGNOSIS — I70262 Atherosclerosis of native arteries of extremities with gangrene, left leg: Secondary | ICD-10-CM | POA: Diagnosis not present

## 2018-08-25 DIAGNOSIS — K219 Gastro-esophageal reflux disease without esophagitis: Secondary | ICD-10-CM | POA: Diagnosis not present

## 2018-08-25 DIAGNOSIS — J449 Chronic obstructive pulmonary disease, unspecified: Secondary | ICD-10-CM | POA: Diagnosis not present

## 2018-08-25 DIAGNOSIS — I10 Essential (primary) hypertension: Secondary | ICD-10-CM | POA: Diagnosis not present

## 2018-08-25 DIAGNOSIS — I509 Heart failure, unspecified: Secondary | ICD-10-CM | POA: Diagnosis not present

## 2018-08-26 DIAGNOSIS — N189 Chronic kidney disease, unspecified: Secondary | ICD-10-CM | POA: Diagnosis not present

## 2018-08-26 DIAGNOSIS — R531 Weakness: Secondary | ICD-10-CM | POA: Diagnosis not present

## 2018-08-26 DIAGNOSIS — J449 Chronic obstructive pulmonary disease, unspecified: Secondary | ICD-10-CM | POA: Diagnosis not present

## 2018-08-26 DIAGNOSIS — I509 Heart failure, unspecified: Secondary | ICD-10-CM | POA: Diagnosis not present

## 2018-08-26 DIAGNOSIS — K219 Gastro-esophageal reflux disease without esophagitis: Secondary | ICD-10-CM | POA: Diagnosis not present

## 2018-08-26 DIAGNOSIS — I10 Essential (primary) hypertension: Secondary | ICD-10-CM | POA: Diagnosis not present

## 2018-08-26 DIAGNOSIS — N400 Enlarged prostate without lower urinary tract symptoms: Secondary | ICD-10-CM | POA: Diagnosis not present

## 2018-08-26 DIAGNOSIS — I4891 Unspecified atrial fibrillation: Secondary | ICD-10-CM | POA: Diagnosis not present

## 2018-08-26 DIAGNOSIS — R52 Pain, unspecified: Secondary | ICD-10-CM | POA: Diagnosis not present

## 2018-08-26 DIAGNOSIS — R0602 Shortness of breath: Secondary | ICD-10-CM | POA: Diagnosis not present

## 2018-08-26 DIAGNOSIS — I70262 Atherosclerosis of native arteries of extremities with gangrene, left leg: Secondary | ICD-10-CM | POA: Diagnosis not present

## 2018-08-27 DIAGNOSIS — I509 Heart failure, unspecified: Secondary | ICD-10-CM | POA: Diagnosis not present

## 2018-08-27 DIAGNOSIS — J449 Chronic obstructive pulmonary disease, unspecified: Secondary | ICD-10-CM | POA: Diagnosis not present

## 2018-08-27 DIAGNOSIS — I4891 Unspecified atrial fibrillation: Secondary | ICD-10-CM | POA: Diagnosis not present

## 2018-08-27 DIAGNOSIS — I70262 Atherosclerosis of native arteries of extremities with gangrene, left leg: Secondary | ICD-10-CM | POA: Diagnosis not present

## 2018-08-27 DIAGNOSIS — I10 Essential (primary) hypertension: Secondary | ICD-10-CM | POA: Diagnosis not present

## 2018-08-27 DIAGNOSIS — K219 Gastro-esophageal reflux disease without esophagitis: Secondary | ICD-10-CM | POA: Diagnosis not present

## 2018-08-28 DIAGNOSIS — K219 Gastro-esophageal reflux disease without esophagitis: Secondary | ICD-10-CM | POA: Diagnosis not present

## 2018-08-28 DIAGNOSIS — I10 Essential (primary) hypertension: Secondary | ICD-10-CM | POA: Diagnosis not present

## 2018-08-28 DIAGNOSIS — I70262 Atherosclerosis of native arteries of extremities with gangrene, left leg: Secondary | ICD-10-CM | POA: Diagnosis not present

## 2018-08-28 DIAGNOSIS — I509 Heart failure, unspecified: Secondary | ICD-10-CM | POA: Diagnosis not present

## 2018-08-28 DIAGNOSIS — J449 Chronic obstructive pulmonary disease, unspecified: Secondary | ICD-10-CM | POA: Diagnosis not present

## 2018-08-28 DIAGNOSIS — I4891 Unspecified atrial fibrillation: Secondary | ICD-10-CM | POA: Diagnosis not present

## 2018-08-30 DIAGNOSIS — I70262 Atherosclerosis of native arteries of extremities with gangrene, left leg: Secondary | ICD-10-CM | POA: Diagnosis not present

## 2018-08-30 DIAGNOSIS — I509 Heart failure, unspecified: Secondary | ICD-10-CM | POA: Diagnosis not present

## 2018-08-30 DIAGNOSIS — I10 Essential (primary) hypertension: Secondary | ICD-10-CM | POA: Diagnosis not present

## 2018-08-30 DIAGNOSIS — I4891 Unspecified atrial fibrillation: Secondary | ICD-10-CM | POA: Diagnosis not present

## 2018-08-30 DIAGNOSIS — J449 Chronic obstructive pulmonary disease, unspecified: Secondary | ICD-10-CM | POA: Diagnosis not present

## 2018-08-30 DIAGNOSIS — K219 Gastro-esophageal reflux disease without esophagitis: Secondary | ICD-10-CM | POA: Diagnosis not present

## 2018-08-31 DIAGNOSIS — I4891 Unspecified atrial fibrillation: Secondary | ICD-10-CM | POA: Diagnosis not present

## 2018-08-31 DIAGNOSIS — J449 Chronic obstructive pulmonary disease, unspecified: Secondary | ICD-10-CM | POA: Diagnosis not present

## 2018-08-31 DIAGNOSIS — I509 Heart failure, unspecified: Secondary | ICD-10-CM | POA: Diagnosis not present

## 2018-08-31 DIAGNOSIS — K219 Gastro-esophageal reflux disease without esophagitis: Secondary | ICD-10-CM | POA: Diagnosis not present

## 2018-08-31 DIAGNOSIS — I70262 Atherosclerosis of native arteries of extremities with gangrene, left leg: Secondary | ICD-10-CM | POA: Diagnosis not present

## 2018-08-31 DIAGNOSIS — I10 Essential (primary) hypertension: Secondary | ICD-10-CM | POA: Diagnosis not present

## 2018-09-01 DIAGNOSIS — I70262 Atherosclerosis of native arteries of extremities with gangrene, left leg: Secondary | ICD-10-CM | POA: Diagnosis not present

## 2018-09-01 DIAGNOSIS — K219 Gastro-esophageal reflux disease without esophagitis: Secondary | ICD-10-CM | POA: Diagnosis not present

## 2018-09-01 DIAGNOSIS — I10 Essential (primary) hypertension: Secondary | ICD-10-CM | POA: Diagnosis not present

## 2018-09-01 DIAGNOSIS — I509 Heart failure, unspecified: Secondary | ICD-10-CM | POA: Diagnosis not present

## 2018-09-01 DIAGNOSIS — J449 Chronic obstructive pulmonary disease, unspecified: Secondary | ICD-10-CM | POA: Diagnosis not present

## 2018-09-01 DIAGNOSIS — I4891 Unspecified atrial fibrillation: Secondary | ICD-10-CM | POA: Diagnosis not present

## 2018-09-03 DIAGNOSIS — I10 Essential (primary) hypertension: Secondary | ICD-10-CM | POA: Diagnosis not present

## 2018-09-03 DIAGNOSIS — I4891 Unspecified atrial fibrillation: Secondary | ICD-10-CM | POA: Diagnosis not present

## 2018-09-03 DIAGNOSIS — I70262 Atherosclerosis of native arteries of extremities with gangrene, left leg: Secondary | ICD-10-CM | POA: Diagnosis not present

## 2018-09-03 DIAGNOSIS — I509 Heart failure, unspecified: Secondary | ICD-10-CM | POA: Diagnosis not present

## 2018-09-03 DIAGNOSIS — J449 Chronic obstructive pulmonary disease, unspecified: Secondary | ICD-10-CM | POA: Diagnosis not present

## 2018-09-03 DIAGNOSIS — K219 Gastro-esophageal reflux disease without esophagitis: Secondary | ICD-10-CM | POA: Diagnosis not present

## 2018-09-04 DIAGNOSIS — I70262 Atherosclerosis of native arteries of extremities with gangrene, left leg: Secondary | ICD-10-CM | POA: Diagnosis not present

## 2018-09-04 DIAGNOSIS — I10 Essential (primary) hypertension: Secondary | ICD-10-CM | POA: Diagnosis not present

## 2018-09-04 DIAGNOSIS — J449 Chronic obstructive pulmonary disease, unspecified: Secondary | ICD-10-CM | POA: Diagnosis not present

## 2018-09-04 DIAGNOSIS — K219 Gastro-esophageal reflux disease without esophagitis: Secondary | ICD-10-CM | POA: Diagnosis not present

## 2018-09-04 DIAGNOSIS — I4891 Unspecified atrial fibrillation: Secondary | ICD-10-CM | POA: Diagnosis not present

## 2018-09-04 DIAGNOSIS — I509 Heart failure, unspecified: Secondary | ICD-10-CM | POA: Diagnosis not present

## 2018-09-05 DIAGNOSIS — K219 Gastro-esophageal reflux disease without esophagitis: Secondary | ICD-10-CM | POA: Diagnosis not present

## 2018-09-05 DIAGNOSIS — I70262 Atherosclerosis of native arteries of extremities with gangrene, left leg: Secondary | ICD-10-CM | POA: Diagnosis not present

## 2018-09-05 DIAGNOSIS — J449 Chronic obstructive pulmonary disease, unspecified: Secondary | ICD-10-CM | POA: Diagnosis not present

## 2018-09-05 DIAGNOSIS — I4891 Unspecified atrial fibrillation: Secondary | ICD-10-CM | POA: Diagnosis not present

## 2018-09-05 DIAGNOSIS — I509 Heart failure, unspecified: Secondary | ICD-10-CM | POA: Diagnosis not present

## 2018-09-05 DIAGNOSIS — I10 Essential (primary) hypertension: Secondary | ICD-10-CM | POA: Diagnosis not present

## 2018-09-06 DIAGNOSIS — I509 Heart failure, unspecified: Secondary | ICD-10-CM | POA: Diagnosis not present

## 2018-09-06 DIAGNOSIS — I70262 Atherosclerosis of native arteries of extremities with gangrene, left leg: Secondary | ICD-10-CM | POA: Diagnosis not present

## 2018-09-06 DIAGNOSIS — J449 Chronic obstructive pulmonary disease, unspecified: Secondary | ICD-10-CM | POA: Diagnosis not present

## 2018-09-06 DIAGNOSIS — I4891 Unspecified atrial fibrillation: Secondary | ICD-10-CM | POA: Diagnosis not present

## 2018-09-06 DIAGNOSIS — K219 Gastro-esophageal reflux disease without esophagitis: Secondary | ICD-10-CM | POA: Diagnosis not present

## 2018-09-06 DIAGNOSIS — I10 Essential (primary) hypertension: Secondary | ICD-10-CM | POA: Diagnosis not present

## 2018-09-07 DIAGNOSIS — I10 Essential (primary) hypertension: Secondary | ICD-10-CM | POA: Diagnosis not present

## 2018-09-07 DIAGNOSIS — J449 Chronic obstructive pulmonary disease, unspecified: Secondary | ICD-10-CM | POA: Diagnosis not present

## 2018-09-07 DIAGNOSIS — I509 Heart failure, unspecified: Secondary | ICD-10-CM | POA: Diagnosis not present

## 2018-09-07 DIAGNOSIS — I70262 Atherosclerosis of native arteries of extremities with gangrene, left leg: Secondary | ICD-10-CM | POA: Diagnosis not present

## 2018-09-07 DIAGNOSIS — K219 Gastro-esophageal reflux disease without esophagitis: Secondary | ICD-10-CM | POA: Diagnosis not present

## 2018-09-07 DIAGNOSIS — I4891 Unspecified atrial fibrillation: Secondary | ICD-10-CM | POA: Diagnosis not present

## 2018-09-10 DIAGNOSIS — J449 Chronic obstructive pulmonary disease, unspecified: Secondary | ICD-10-CM | POA: Diagnosis not present

## 2018-09-10 DIAGNOSIS — I509 Heart failure, unspecified: Secondary | ICD-10-CM | POA: Diagnosis not present

## 2018-09-10 DIAGNOSIS — K219 Gastro-esophageal reflux disease without esophagitis: Secondary | ICD-10-CM | POA: Diagnosis not present

## 2018-09-10 DIAGNOSIS — I70262 Atherosclerosis of native arteries of extremities with gangrene, left leg: Secondary | ICD-10-CM | POA: Diagnosis not present

## 2018-09-10 DIAGNOSIS — I4891 Unspecified atrial fibrillation: Secondary | ICD-10-CM | POA: Diagnosis not present

## 2018-09-10 DIAGNOSIS — I10 Essential (primary) hypertension: Secondary | ICD-10-CM | POA: Diagnosis not present

## 2018-09-12 DIAGNOSIS — I70262 Atherosclerosis of native arteries of extremities with gangrene, left leg: Secondary | ICD-10-CM | POA: Diagnosis not present

## 2018-09-12 DIAGNOSIS — K219 Gastro-esophageal reflux disease without esophagitis: Secondary | ICD-10-CM | POA: Diagnosis not present

## 2018-09-12 DIAGNOSIS — J449 Chronic obstructive pulmonary disease, unspecified: Secondary | ICD-10-CM | POA: Diagnosis not present

## 2018-09-12 DIAGNOSIS — I4891 Unspecified atrial fibrillation: Secondary | ICD-10-CM | POA: Diagnosis not present

## 2018-09-12 DIAGNOSIS — I10 Essential (primary) hypertension: Secondary | ICD-10-CM | POA: Diagnosis not present

## 2018-09-12 DIAGNOSIS — I509 Heart failure, unspecified: Secondary | ICD-10-CM | POA: Diagnosis not present

## 2018-09-13 DIAGNOSIS — J449 Chronic obstructive pulmonary disease, unspecified: Secondary | ICD-10-CM | POA: Diagnosis not present

## 2018-09-13 DIAGNOSIS — I4891 Unspecified atrial fibrillation: Secondary | ICD-10-CM | POA: Diagnosis not present

## 2018-09-13 DIAGNOSIS — I509 Heart failure, unspecified: Secondary | ICD-10-CM | POA: Diagnosis not present

## 2018-09-13 DIAGNOSIS — I70262 Atherosclerosis of native arteries of extremities with gangrene, left leg: Secondary | ICD-10-CM | POA: Diagnosis not present

## 2018-09-13 DIAGNOSIS — I10 Essential (primary) hypertension: Secondary | ICD-10-CM | POA: Diagnosis not present

## 2018-09-13 DIAGNOSIS — K219 Gastro-esophageal reflux disease without esophagitis: Secondary | ICD-10-CM | POA: Diagnosis not present

## 2018-09-14 DIAGNOSIS — I70262 Atherosclerosis of native arteries of extremities with gangrene, left leg: Secondary | ICD-10-CM | POA: Diagnosis not present

## 2018-09-14 DIAGNOSIS — J449 Chronic obstructive pulmonary disease, unspecified: Secondary | ICD-10-CM | POA: Diagnosis not present

## 2018-09-14 DIAGNOSIS — I509 Heart failure, unspecified: Secondary | ICD-10-CM | POA: Diagnosis not present

## 2018-09-14 DIAGNOSIS — I10 Essential (primary) hypertension: Secondary | ICD-10-CM | POA: Diagnosis not present

## 2018-09-14 DIAGNOSIS — K219 Gastro-esophageal reflux disease without esophagitis: Secondary | ICD-10-CM | POA: Diagnosis not present

## 2018-09-14 DIAGNOSIS — I4891 Unspecified atrial fibrillation: Secondary | ICD-10-CM | POA: Diagnosis not present

## 2018-09-17 DIAGNOSIS — I509 Heart failure, unspecified: Secondary | ICD-10-CM | POA: Diagnosis not present

## 2018-09-17 DIAGNOSIS — I70262 Atherosclerosis of native arteries of extremities with gangrene, left leg: Secondary | ICD-10-CM | POA: Diagnosis not present

## 2018-09-17 DIAGNOSIS — I4891 Unspecified atrial fibrillation: Secondary | ICD-10-CM | POA: Diagnosis not present

## 2018-09-17 DIAGNOSIS — J449 Chronic obstructive pulmonary disease, unspecified: Secondary | ICD-10-CM | POA: Diagnosis not present

## 2018-09-17 DIAGNOSIS — K219 Gastro-esophageal reflux disease without esophagitis: Secondary | ICD-10-CM | POA: Diagnosis not present

## 2018-09-17 DIAGNOSIS — I10 Essential (primary) hypertension: Secondary | ICD-10-CM | POA: Diagnosis not present

## 2018-09-18 DIAGNOSIS — J449 Chronic obstructive pulmonary disease, unspecified: Secondary | ICD-10-CM | POA: Diagnosis not present

## 2018-09-18 DIAGNOSIS — I4891 Unspecified atrial fibrillation: Secondary | ICD-10-CM | POA: Diagnosis not present

## 2018-09-18 DIAGNOSIS — K219 Gastro-esophageal reflux disease without esophagitis: Secondary | ICD-10-CM | POA: Diagnosis not present

## 2018-09-18 DIAGNOSIS — I10 Essential (primary) hypertension: Secondary | ICD-10-CM | POA: Diagnosis not present

## 2018-09-18 DIAGNOSIS — I509 Heart failure, unspecified: Secondary | ICD-10-CM | POA: Diagnosis not present

## 2018-09-18 DIAGNOSIS — I70262 Atherosclerosis of native arteries of extremities with gangrene, left leg: Secondary | ICD-10-CM | POA: Diagnosis not present

## 2018-09-21 DIAGNOSIS — I509 Heart failure, unspecified: Secondary | ICD-10-CM | POA: Diagnosis not present

## 2018-09-21 DIAGNOSIS — K219 Gastro-esophageal reflux disease without esophagitis: Secondary | ICD-10-CM | POA: Diagnosis not present

## 2018-09-21 DIAGNOSIS — I70262 Atherosclerosis of native arteries of extremities with gangrene, left leg: Secondary | ICD-10-CM | POA: Diagnosis not present

## 2018-09-21 DIAGNOSIS — I4891 Unspecified atrial fibrillation: Secondary | ICD-10-CM | POA: Diagnosis not present

## 2018-09-21 DIAGNOSIS — I10 Essential (primary) hypertension: Secondary | ICD-10-CM | POA: Diagnosis not present

## 2018-09-21 DIAGNOSIS — J449 Chronic obstructive pulmonary disease, unspecified: Secondary | ICD-10-CM | POA: Diagnosis not present

## 2018-09-24 DIAGNOSIS — I10 Essential (primary) hypertension: Secondary | ICD-10-CM | POA: Diagnosis not present

## 2018-09-24 DIAGNOSIS — I509 Heart failure, unspecified: Secondary | ICD-10-CM | POA: Diagnosis not present

## 2018-09-24 DIAGNOSIS — K219 Gastro-esophageal reflux disease without esophagitis: Secondary | ICD-10-CM | POA: Diagnosis not present

## 2018-09-24 DIAGNOSIS — I70262 Atherosclerosis of native arteries of extremities with gangrene, left leg: Secondary | ICD-10-CM | POA: Diagnosis not present

## 2018-09-24 DIAGNOSIS — J449 Chronic obstructive pulmonary disease, unspecified: Secondary | ICD-10-CM | POA: Diagnosis not present

## 2018-09-24 DIAGNOSIS — I4891 Unspecified atrial fibrillation: Secondary | ICD-10-CM | POA: Diagnosis not present

## 2018-09-25 DIAGNOSIS — J449 Chronic obstructive pulmonary disease, unspecified: Secondary | ICD-10-CM | POA: Diagnosis not present

## 2018-09-25 DIAGNOSIS — K219 Gastro-esophageal reflux disease without esophagitis: Secondary | ICD-10-CM | POA: Diagnosis not present

## 2018-09-25 DIAGNOSIS — I4891 Unspecified atrial fibrillation: Secondary | ICD-10-CM | POA: Diagnosis not present

## 2018-09-25 DIAGNOSIS — I70262 Atherosclerosis of native arteries of extremities with gangrene, left leg: Secondary | ICD-10-CM | POA: Diagnosis not present

## 2018-09-25 DIAGNOSIS — I10 Essential (primary) hypertension: Secondary | ICD-10-CM | POA: Diagnosis not present

## 2018-09-25 DIAGNOSIS — I509 Heart failure, unspecified: Secondary | ICD-10-CM | POA: Diagnosis not present

## 2018-09-26 DIAGNOSIS — I509 Heart failure, unspecified: Secondary | ICD-10-CM | POA: Diagnosis not present

## 2018-09-26 DIAGNOSIS — R52 Pain, unspecified: Secondary | ICD-10-CM | POA: Diagnosis not present

## 2018-09-26 DIAGNOSIS — I10 Essential (primary) hypertension: Secondary | ICD-10-CM | POA: Diagnosis not present

## 2018-09-26 DIAGNOSIS — J449 Chronic obstructive pulmonary disease, unspecified: Secondary | ICD-10-CM | POA: Diagnosis not present

## 2018-09-26 DIAGNOSIS — I70262 Atherosclerosis of native arteries of extremities with gangrene, left leg: Secondary | ICD-10-CM | POA: Diagnosis not present

## 2018-09-26 DIAGNOSIS — N189 Chronic kidney disease, unspecified: Secondary | ICD-10-CM | POA: Diagnosis not present

## 2018-09-26 DIAGNOSIS — K219 Gastro-esophageal reflux disease without esophagitis: Secondary | ICD-10-CM | POA: Diagnosis not present

## 2018-09-26 DIAGNOSIS — I4891 Unspecified atrial fibrillation: Secondary | ICD-10-CM | POA: Diagnosis not present

## 2018-09-26 DIAGNOSIS — R0602 Shortness of breath: Secondary | ICD-10-CM | POA: Diagnosis not present

## 2018-09-26 DIAGNOSIS — R531 Weakness: Secondary | ICD-10-CM | POA: Diagnosis not present

## 2018-09-26 DIAGNOSIS — N400 Enlarged prostate without lower urinary tract symptoms: Secondary | ICD-10-CM | POA: Diagnosis not present

## 2018-09-27 DIAGNOSIS — I509 Heart failure, unspecified: Secondary | ICD-10-CM | POA: Diagnosis not present

## 2018-09-27 DIAGNOSIS — I4891 Unspecified atrial fibrillation: Secondary | ICD-10-CM | POA: Diagnosis not present

## 2018-09-27 DIAGNOSIS — I70262 Atherosclerosis of native arteries of extremities with gangrene, left leg: Secondary | ICD-10-CM | POA: Diagnosis not present

## 2018-09-27 DIAGNOSIS — I10 Essential (primary) hypertension: Secondary | ICD-10-CM | POA: Diagnosis not present

## 2018-09-27 DIAGNOSIS — K219 Gastro-esophageal reflux disease without esophagitis: Secondary | ICD-10-CM | POA: Diagnosis not present

## 2018-09-27 DIAGNOSIS — J449 Chronic obstructive pulmonary disease, unspecified: Secondary | ICD-10-CM | POA: Diagnosis not present

## 2018-09-28 DIAGNOSIS — I4891 Unspecified atrial fibrillation: Secondary | ICD-10-CM | POA: Diagnosis not present

## 2018-09-28 DIAGNOSIS — K219 Gastro-esophageal reflux disease without esophagitis: Secondary | ICD-10-CM | POA: Diagnosis not present

## 2018-09-28 DIAGNOSIS — I509 Heart failure, unspecified: Secondary | ICD-10-CM | POA: Diagnosis not present

## 2018-09-28 DIAGNOSIS — I70262 Atherosclerosis of native arteries of extremities with gangrene, left leg: Secondary | ICD-10-CM | POA: Diagnosis not present

## 2018-09-28 DIAGNOSIS — J449 Chronic obstructive pulmonary disease, unspecified: Secondary | ICD-10-CM | POA: Diagnosis not present

## 2018-09-28 DIAGNOSIS — I10 Essential (primary) hypertension: Secondary | ICD-10-CM | POA: Diagnosis not present

## 2018-10-01 DIAGNOSIS — I10 Essential (primary) hypertension: Secondary | ICD-10-CM | POA: Diagnosis not present

## 2018-10-01 DIAGNOSIS — J449 Chronic obstructive pulmonary disease, unspecified: Secondary | ICD-10-CM | POA: Diagnosis not present

## 2018-10-01 DIAGNOSIS — I70262 Atherosclerosis of native arteries of extremities with gangrene, left leg: Secondary | ICD-10-CM | POA: Diagnosis not present

## 2018-10-01 DIAGNOSIS — K219 Gastro-esophageal reflux disease without esophagitis: Secondary | ICD-10-CM | POA: Diagnosis not present

## 2018-10-01 DIAGNOSIS — I4891 Unspecified atrial fibrillation: Secondary | ICD-10-CM | POA: Diagnosis not present

## 2018-10-01 DIAGNOSIS — I509 Heart failure, unspecified: Secondary | ICD-10-CM | POA: Diagnosis not present

## 2018-10-02 DIAGNOSIS — I509 Heart failure, unspecified: Secondary | ICD-10-CM | POA: Diagnosis not present

## 2018-10-02 DIAGNOSIS — K219 Gastro-esophageal reflux disease without esophagitis: Secondary | ICD-10-CM | POA: Diagnosis not present

## 2018-10-02 DIAGNOSIS — I10 Essential (primary) hypertension: Secondary | ICD-10-CM | POA: Diagnosis not present

## 2018-10-02 DIAGNOSIS — J449 Chronic obstructive pulmonary disease, unspecified: Secondary | ICD-10-CM | POA: Diagnosis not present

## 2018-10-02 DIAGNOSIS — I70262 Atherosclerosis of native arteries of extremities with gangrene, left leg: Secondary | ICD-10-CM | POA: Diagnosis not present

## 2018-10-02 DIAGNOSIS — I4891 Unspecified atrial fibrillation: Secondary | ICD-10-CM | POA: Diagnosis not present

## 2018-10-05 DIAGNOSIS — J449 Chronic obstructive pulmonary disease, unspecified: Secondary | ICD-10-CM | POA: Diagnosis not present

## 2018-10-05 DIAGNOSIS — I10 Essential (primary) hypertension: Secondary | ICD-10-CM | POA: Diagnosis not present

## 2018-10-05 DIAGNOSIS — I4891 Unspecified atrial fibrillation: Secondary | ICD-10-CM | POA: Diagnosis not present

## 2018-10-05 DIAGNOSIS — K219 Gastro-esophageal reflux disease without esophagitis: Secondary | ICD-10-CM | POA: Diagnosis not present

## 2018-10-05 DIAGNOSIS — I70262 Atherosclerosis of native arteries of extremities with gangrene, left leg: Secondary | ICD-10-CM | POA: Diagnosis not present

## 2018-10-05 DIAGNOSIS — I509 Heart failure, unspecified: Secondary | ICD-10-CM | POA: Diagnosis not present

## 2018-10-07 DIAGNOSIS — F339 Major depressive disorder, recurrent, unspecified: Secondary | ICD-10-CM | POA: Diagnosis not present

## 2018-10-07 DIAGNOSIS — M79605 Pain in left leg: Secondary | ICD-10-CM | POA: Diagnosis not present

## 2018-10-07 DIAGNOSIS — I4891 Unspecified atrial fibrillation: Secondary | ICD-10-CM | POA: Diagnosis not present

## 2018-10-09 DIAGNOSIS — J449 Chronic obstructive pulmonary disease, unspecified: Secondary | ICD-10-CM | POA: Diagnosis not present

## 2018-10-09 DIAGNOSIS — I4891 Unspecified atrial fibrillation: Secondary | ICD-10-CM | POA: Diagnosis not present

## 2018-10-09 DIAGNOSIS — K219 Gastro-esophageal reflux disease without esophagitis: Secondary | ICD-10-CM | POA: Diagnosis not present

## 2018-10-09 DIAGNOSIS — I509 Heart failure, unspecified: Secondary | ICD-10-CM | POA: Diagnosis not present

## 2018-10-09 DIAGNOSIS — I70262 Atherosclerosis of native arteries of extremities with gangrene, left leg: Secondary | ICD-10-CM | POA: Diagnosis not present

## 2018-10-09 DIAGNOSIS — I10 Essential (primary) hypertension: Secondary | ICD-10-CM | POA: Diagnosis not present

## 2018-10-10 DIAGNOSIS — J449 Chronic obstructive pulmonary disease, unspecified: Secondary | ICD-10-CM | POA: Diagnosis not present

## 2018-10-10 DIAGNOSIS — I4891 Unspecified atrial fibrillation: Secondary | ICD-10-CM | POA: Diagnosis not present

## 2018-10-10 DIAGNOSIS — K219 Gastro-esophageal reflux disease without esophagitis: Secondary | ICD-10-CM | POA: Diagnosis not present

## 2018-10-10 DIAGNOSIS — I10 Essential (primary) hypertension: Secondary | ICD-10-CM | POA: Diagnosis not present

## 2018-10-10 DIAGNOSIS — I509 Heart failure, unspecified: Secondary | ICD-10-CM | POA: Diagnosis not present

## 2018-10-10 DIAGNOSIS — I70262 Atherosclerosis of native arteries of extremities with gangrene, left leg: Secondary | ICD-10-CM | POA: Diagnosis not present

## 2018-10-12 DIAGNOSIS — J449 Chronic obstructive pulmonary disease, unspecified: Secondary | ICD-10-CM | POA: Diagnosis not present

## 2018-10-12 DIAGNOSIS — K219 Gastro-esophageal reflux disease without esophagitis: Secondary | ICD-10-CM | POA: Diagnosis not present

## 2018-10-12 DIAGNOSIS — I4891 Unspecified atrial fibrillation: Secondary | ICD-10-CM | POA: Diagnosis not present

## 2018-10-12 DIAGNOSIS — I10 Essential (primary) hypertension: Secondary | ICD-10-CM | POA: Diagnosis not present

## 2018-10-12 DIAGNOSIS — I70262 Atherosclerosis of native arteries of extremities with gangrene, left leg: Secondary | ICD-10-CM | POA: Diagnosis not present

## 2018-10-12 DIAGNOSIS — I509 Heart failure, unspecified: Secondary | ICD-10-CM | POA: Diagnosis not present

## 2018-10-15 DIAGNOSIS — I10 Essential (primary) hypertension: Secondary | ICD-10-CM | POA: Diagnosis not present

## 2018-10-15 DIAGNOSIS — I70262 Atherosclerosis of native arteries of extremities with gangrene, left leg: Secondary | ICD-10-CM | POA: Diagnosis not present

## 2018-10-15 DIAGNOSIS — K219 Gastro-esophageal reflux disease without esophagitis: Secondary | ICD-10-CM | POA: Diagnosis not present

## 2018-10-15 DIAGNOSIS — I4891 Unspecified atrial fibrillation: Secondary | ICD-10-CM | POA: Diagnosis not present

## 2018-10-15 DIAGNOSIS — J449 Chronic obstructive pulmonary disease, unspecified: Secondary | ICD-10-CM | POA: Diagnosis not present

## 2018-10-15 DIAGNOSIS — I509 Heart failure, unspecified: Secondary | ICD-10-CM | POA: Diagnosis not present

## 2018-10-26 DEATH — deceased

## 2018-12-23 IMAGING — CR DG CHEST 1V PORT
1 series · 1 of 1 positions shown · non-contrast
Comparison: Radiographs October 04, 2016.

CLINICAL DATA: Weakness.

EXAM:
PORTABLE CHEST 1 VIEW

[portable]
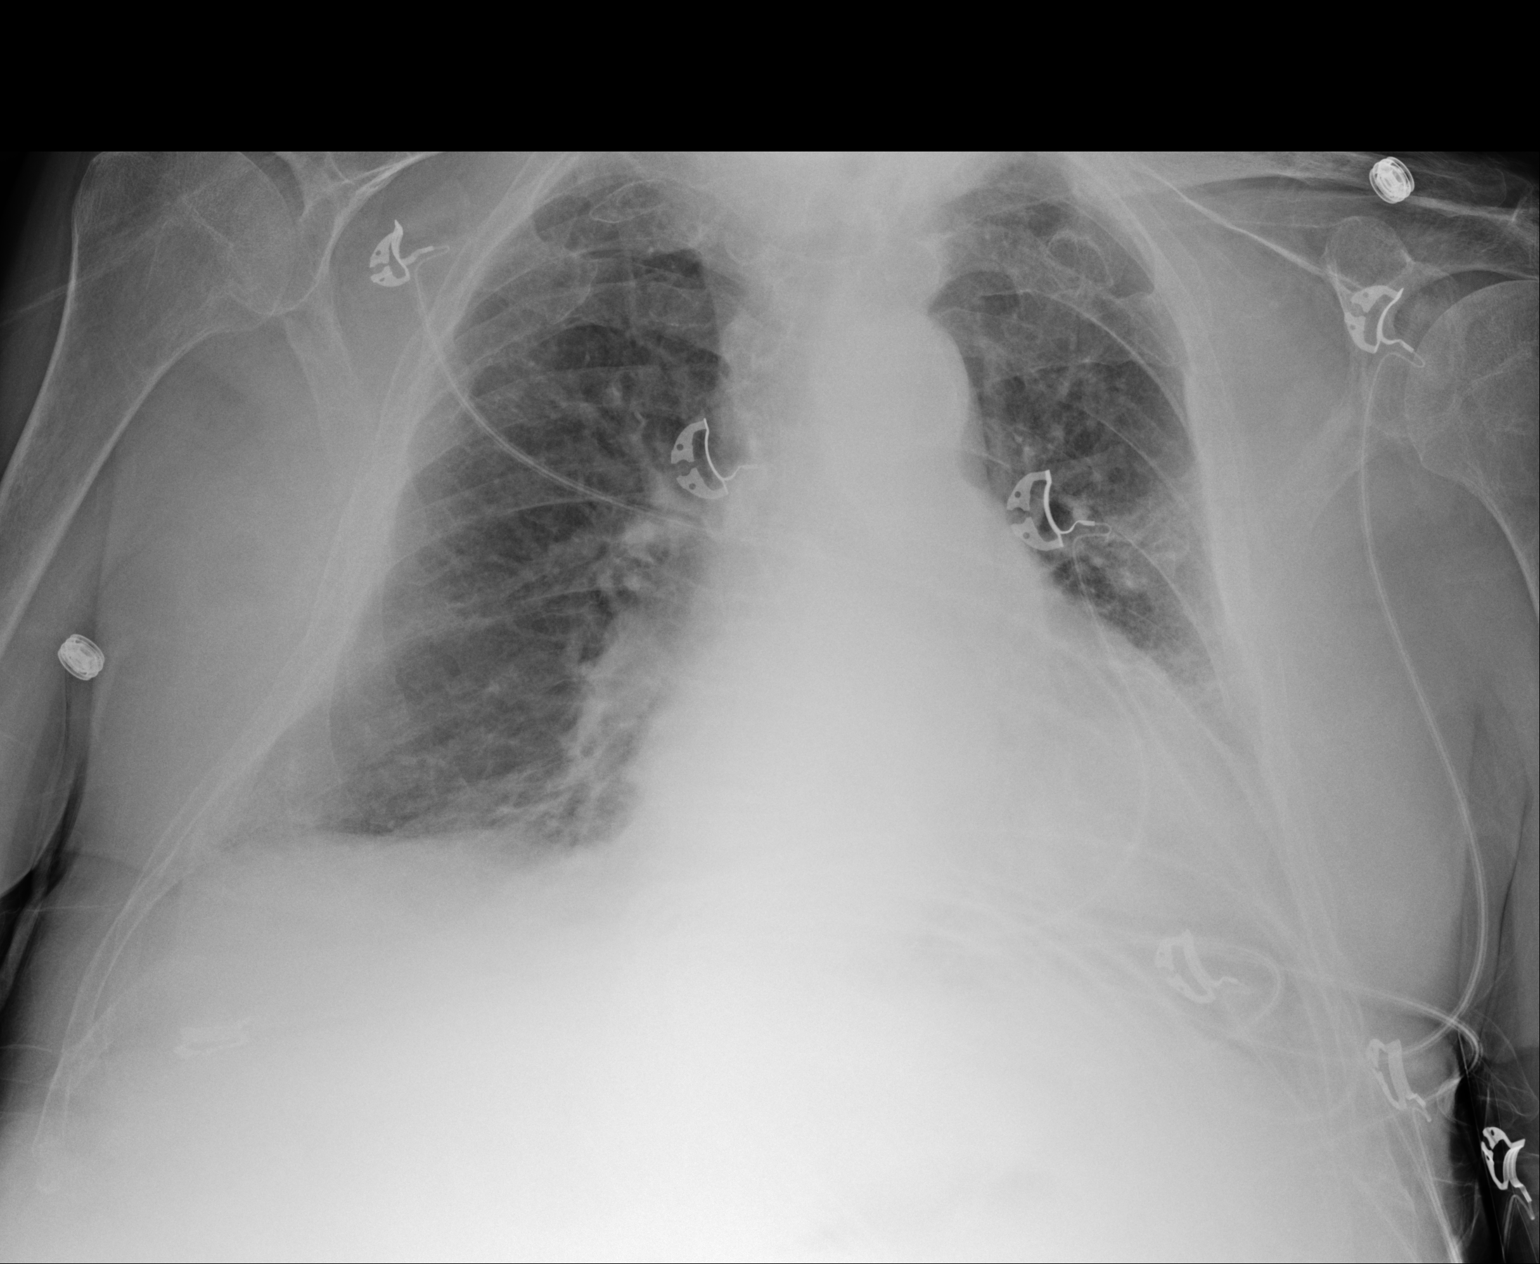

[1 of 1 positions shown; findings below may reference images not displayed]

FINDINGS: Stable cardiomegaly. No pneumothorax is noted. Mild central
pulmonary vascular congestion is noted. Mild bibasilar subsegmental
atelectasis or infiltrates are noted. No definite pleural effusion
is noted. Left midlung opacity is noted concerning for atelectasis
or infiltrate. Bony thorax is unremarkable.
IMPRESSION: Stable cardiomegaly with mild central pulmonary vascular congestion.
Mild bibasilar subsegmental atelectasis or infiltrates are noted.
Mild left perihilar atelectasis or infiltrate is noted. Followup PA
and lateral chest X-ray is recommended in 3-4 weeks following trial
of antibiotic therapy to ensure resolution and exclude underlying
malignancy.

## 2018-12-23 IMAGING — DX DG PELVIS 1-2V
2 series · 2 of 2 positions shown · non-contrast
Comparison: Right hip 05/14/2013

CLINICAL DATA: Fever, pelvic pain. History of falls. Rule out
osteomyelitis.

EXAM:
PELVIS - 1-2 VIEW

[pelvis ap (1 of 2)]
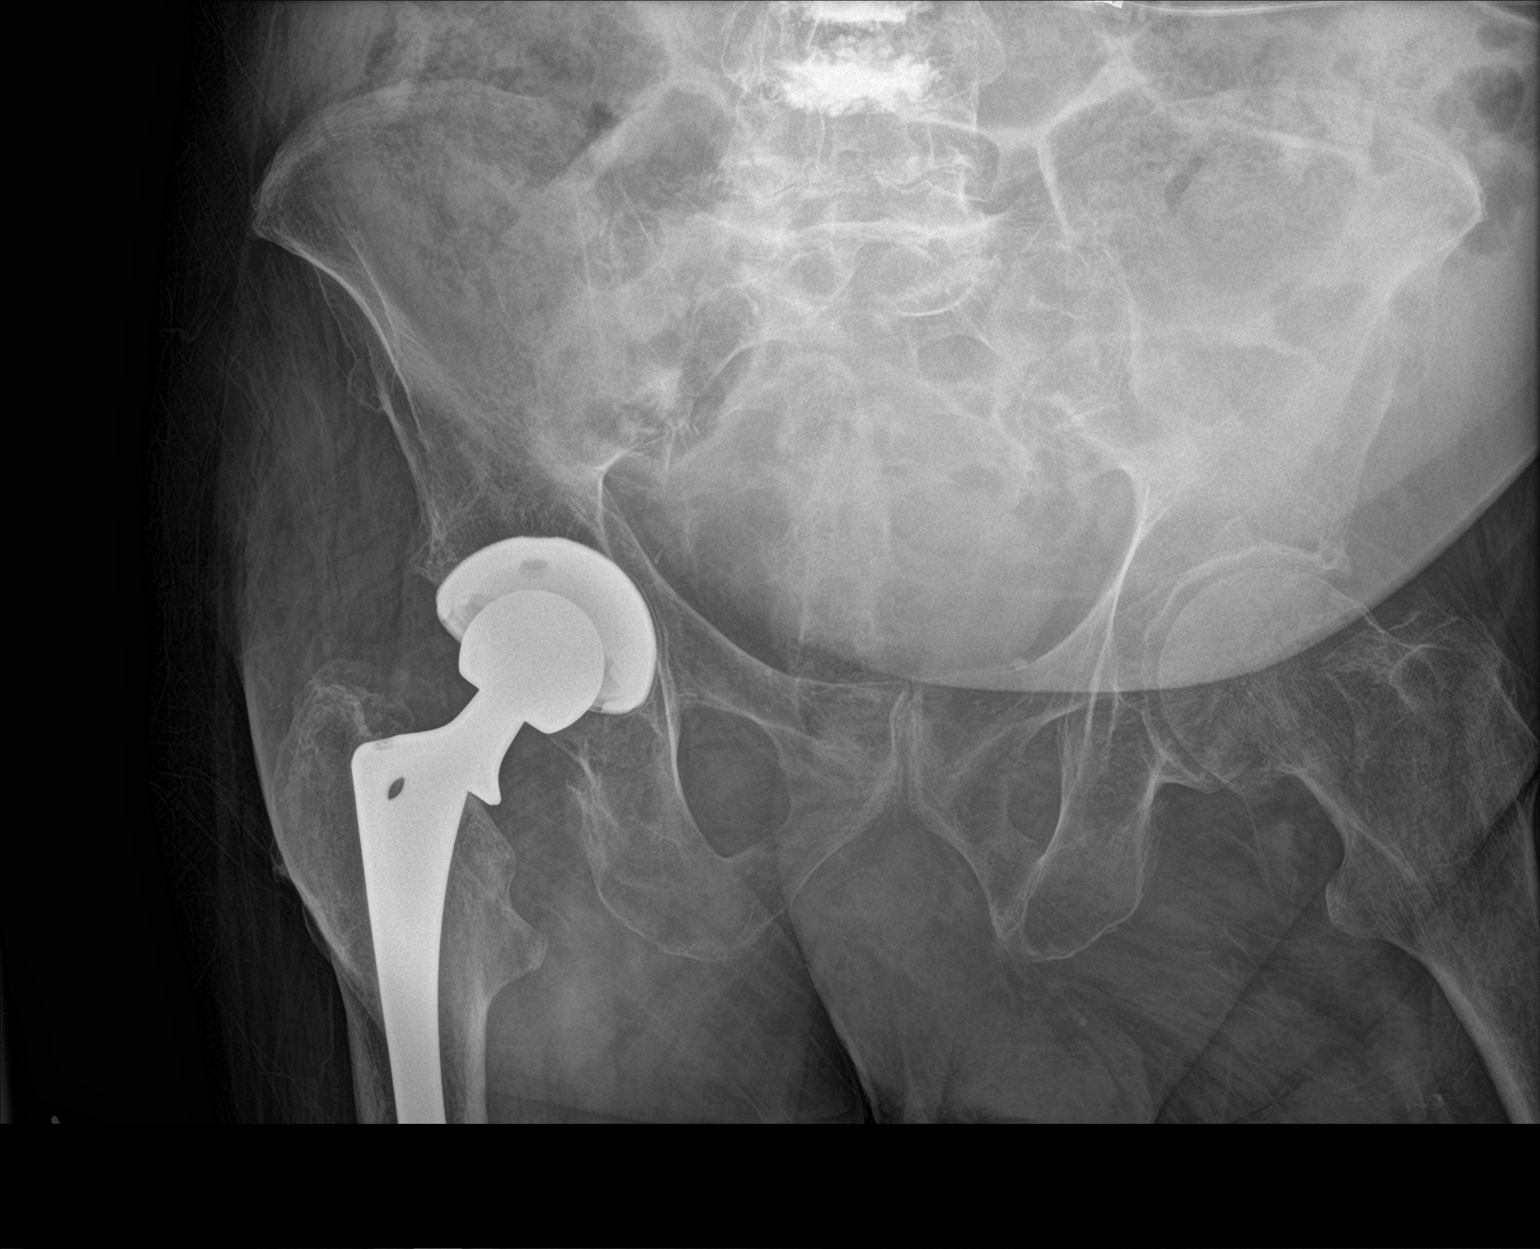

[pelvis ap (2 of 2)]
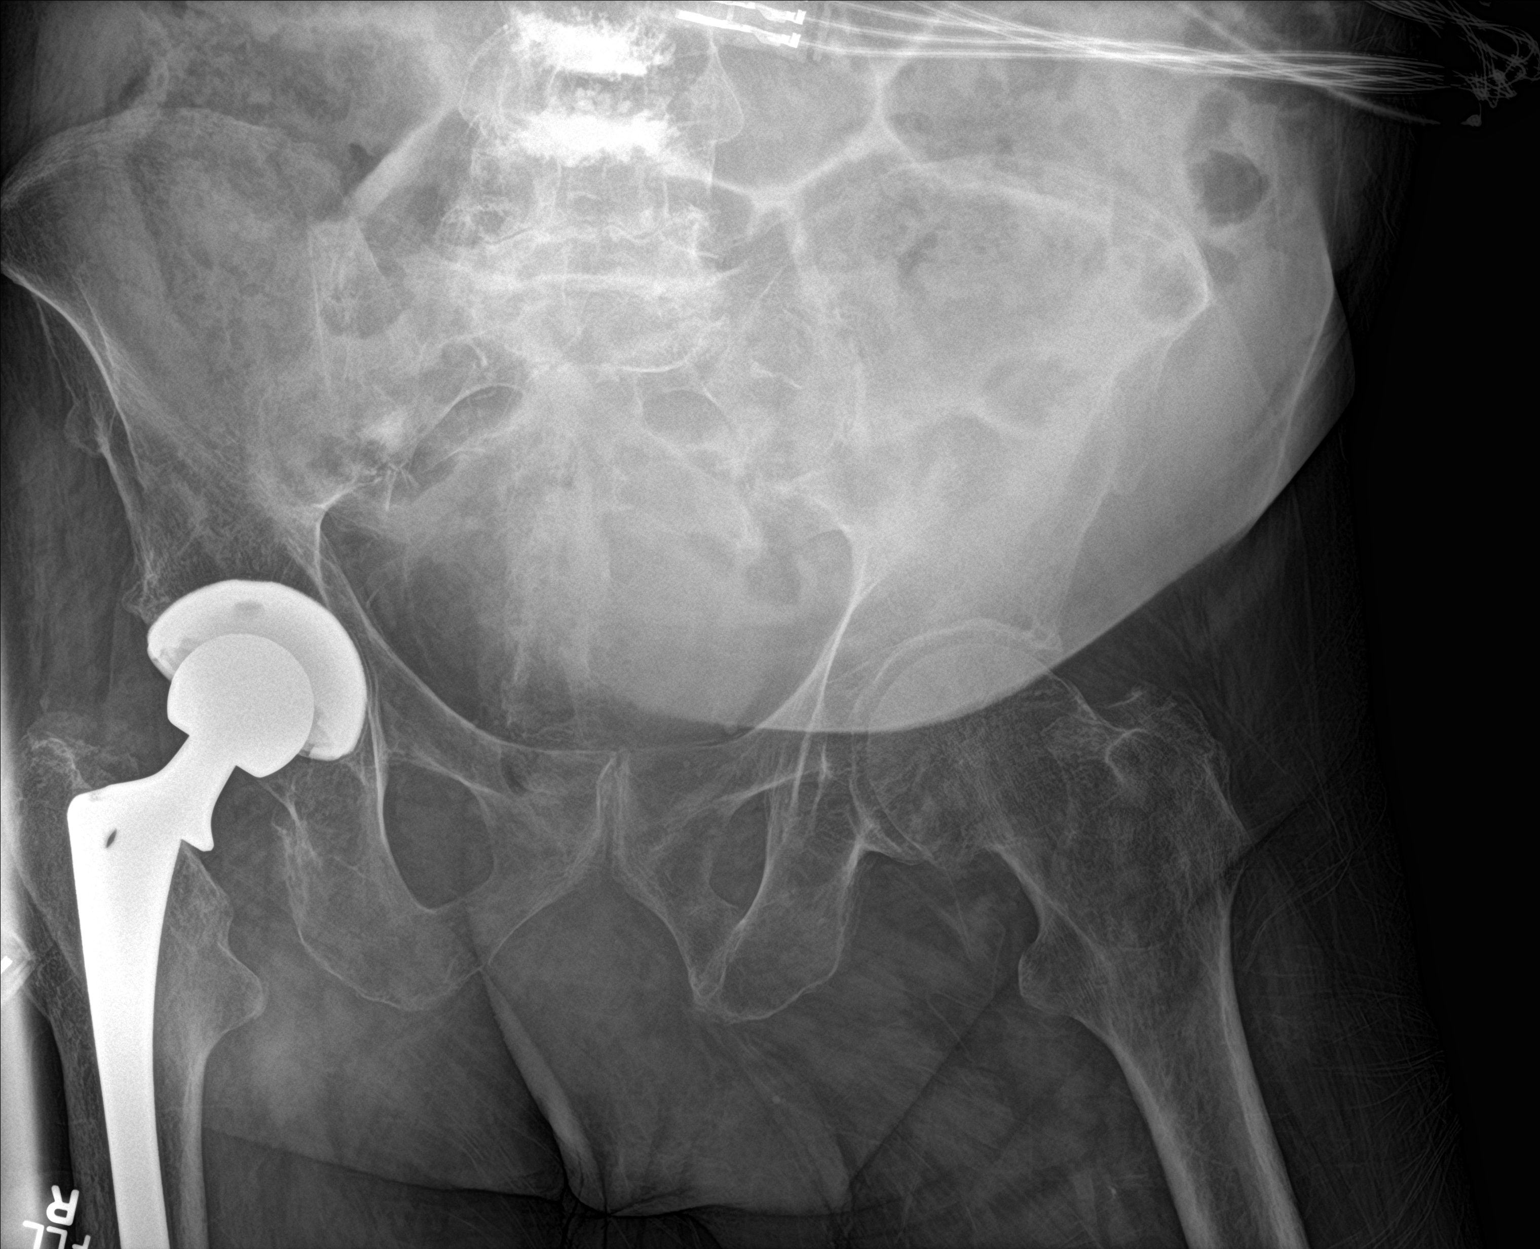

[2 of 2 positions shown; findings below may reference images not displayed]

FINDINGS: Right hip replacement in satisfactory position. Negative for
fracture. No lucency surrounding the prosthesis.

Negative for acute pelvic fracture. Chronic fractures with
vertebroplasty at L3 and L4.
IMPRESSION: No acute abnormality.  Right hip replacement.

## 2019-01-28 ENCOUNTER — Other Ambulatory Visit: Payer: Self-pay

## 2019-03-25 IMAGING — MR MR THORACIC SPINE W/O CM
4 of 6 series · 14 of 48 positions shown · non-contrast
Comparison: CT chest 10/04/2016

CLINICAL DATA: Chronic back pain

EXAM:
MRI THORACIC SPINE WITHOUT CONTRAST
TECHNIQUE: Multiplanar, multisequence MR imaging of the thoracic spine was
performed. No intravenous contrast was administered.

[Series 8: T1 · sagittal · 4.0mm · 0.78mm/px · 3 of 13 slices shown]
[im 1/13]
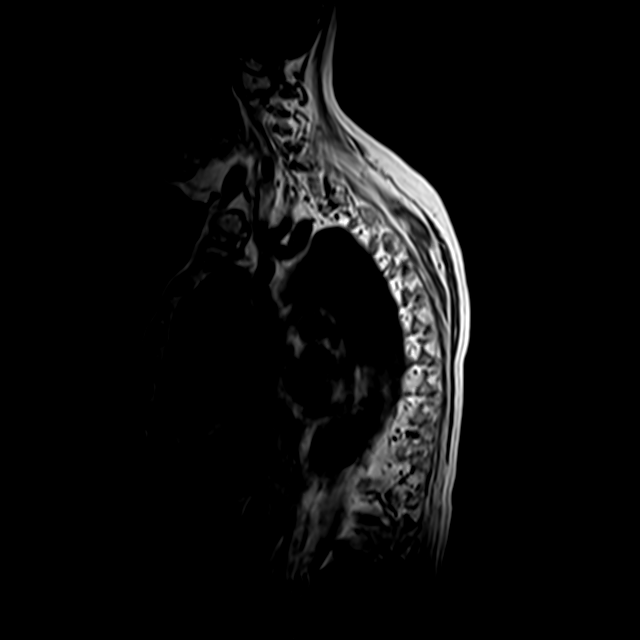
[im 7/13]
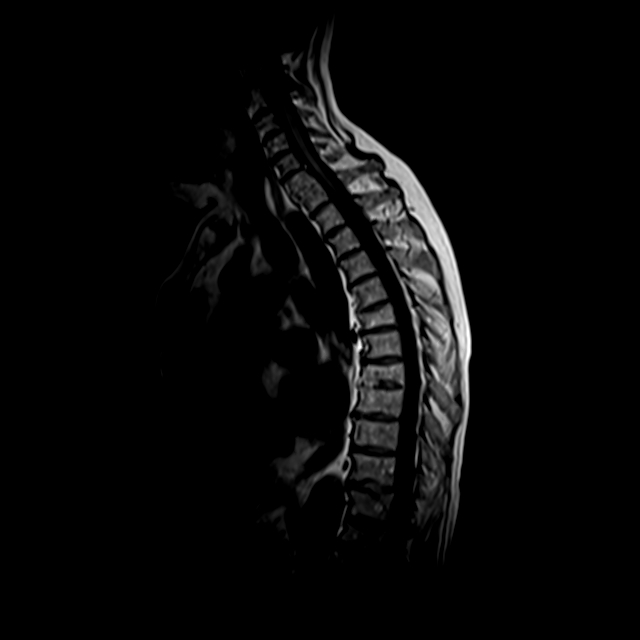
[im 13/13]
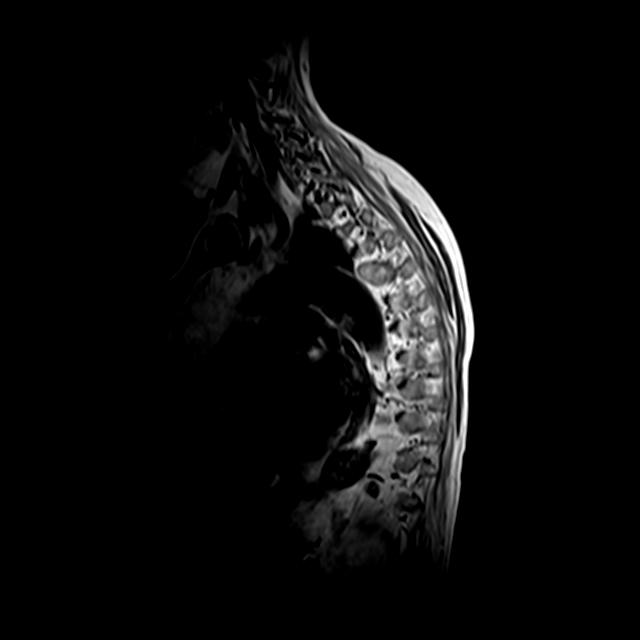

[Series 9: T2 · sagittal · 4.0mm · 0.59mm/px · 5 of 15 slices shown (1 of 3)]
[im 1/15]
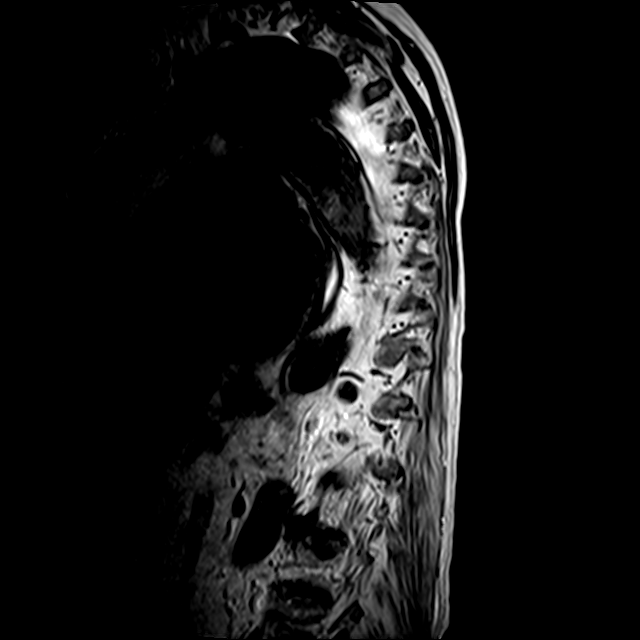
[im 3/15]
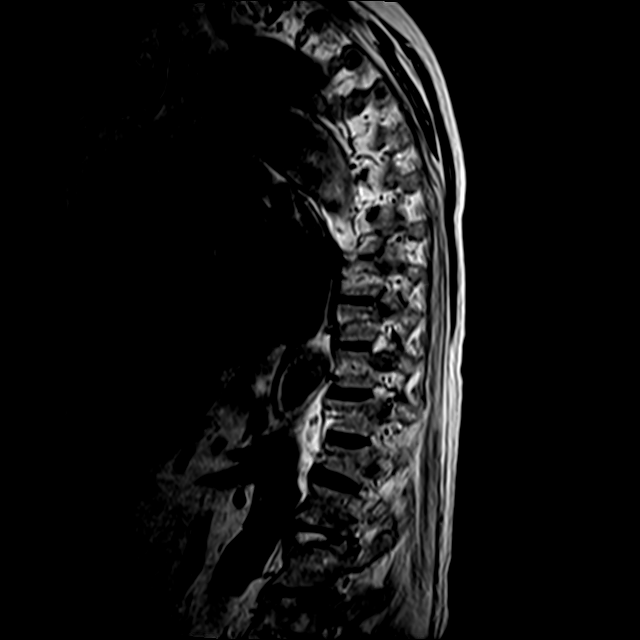
[im 6/15]
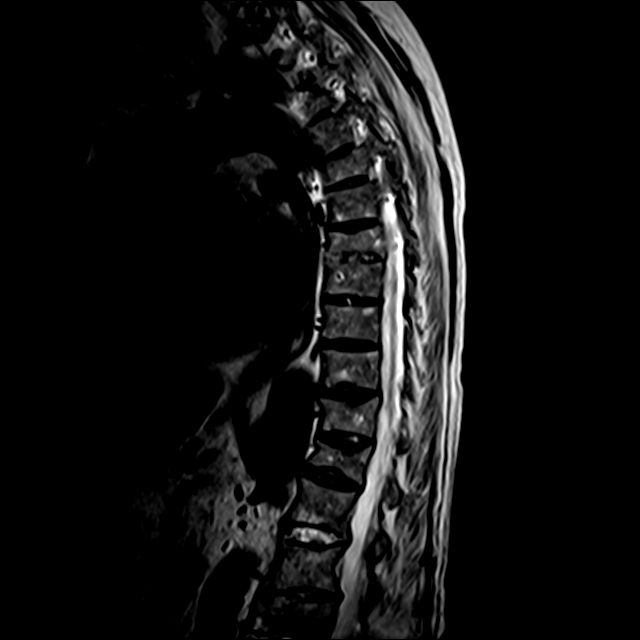
[im 9/15]
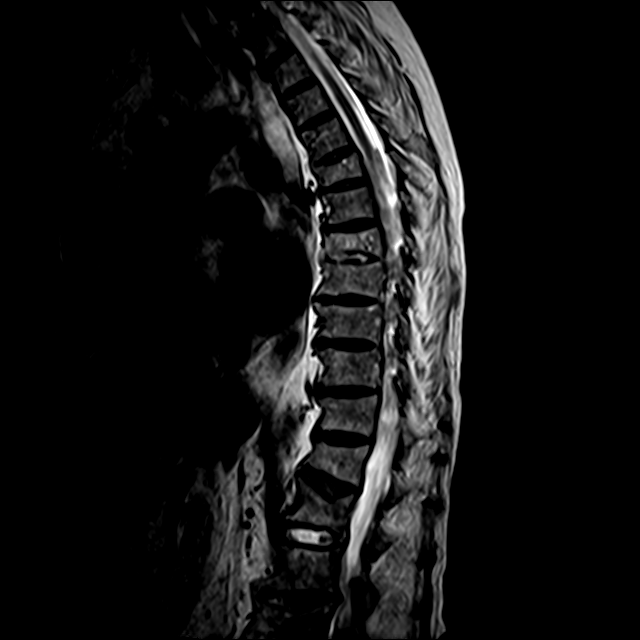
[im 15/15]
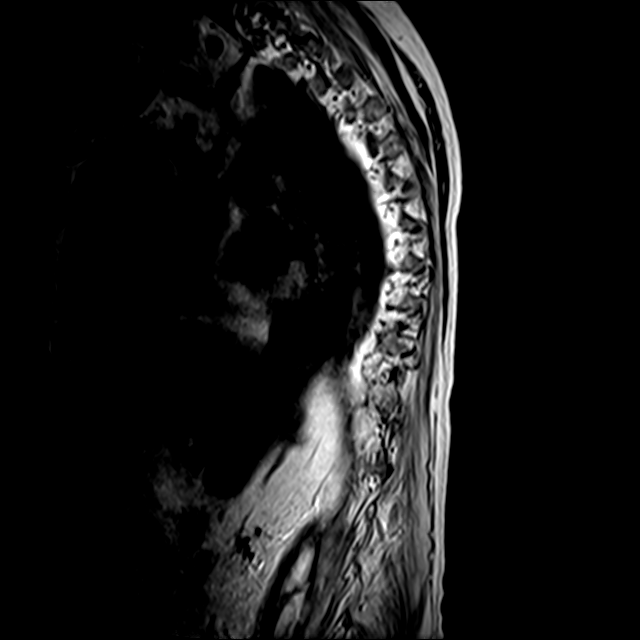

[Series 11: T2 · sagittal · 4.0mm · 0.59mm/px · 3 of 18 slices shown (2 of 3)]
[im 3/18]
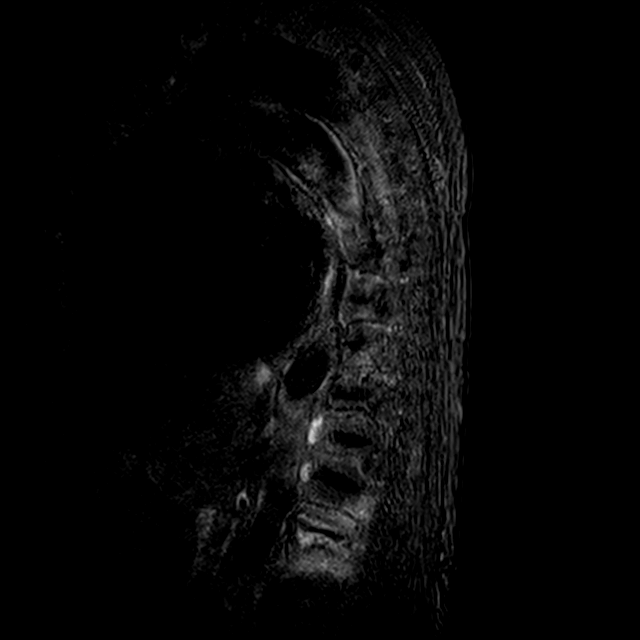
[im 9/18]
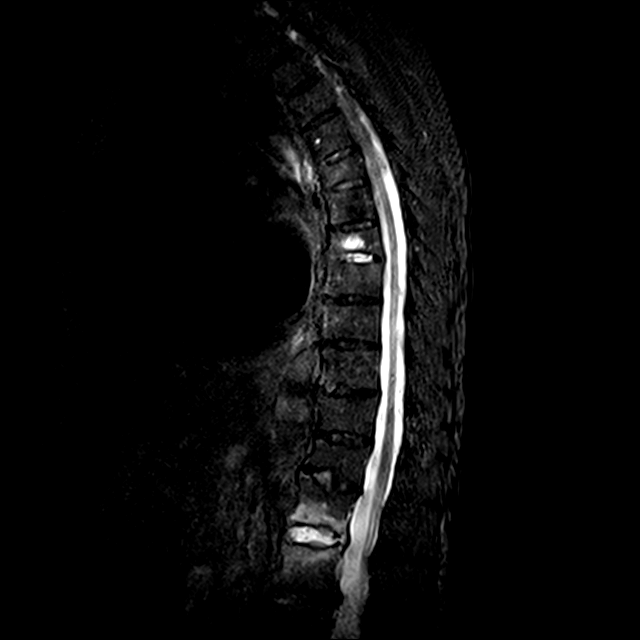
[im 15/18]
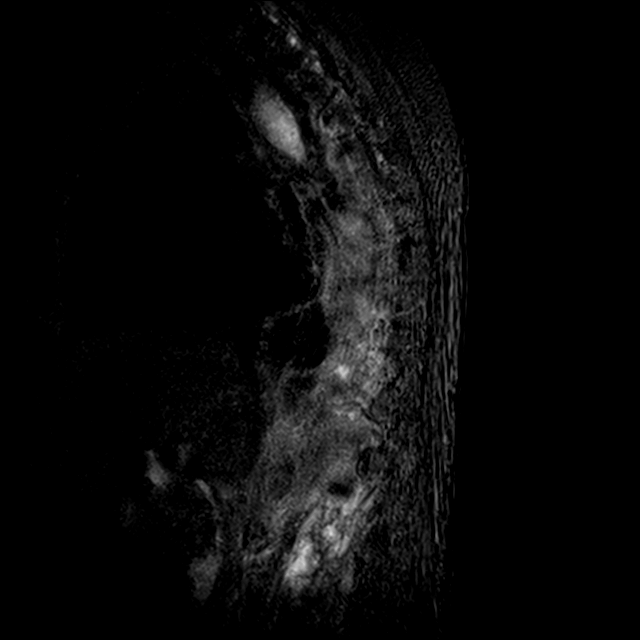

[Series 13: T2 · axial · 3.0mm · 0.31mm/px · z∈[-205,-77]mm · 3 of 33 slices shown (3 of 3)]
[im 6/33]
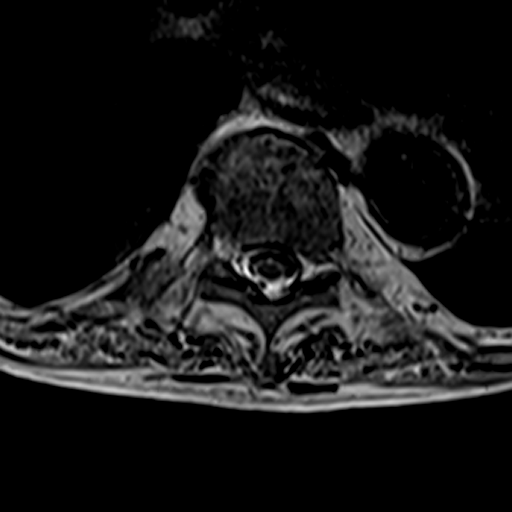
[im 18/33]
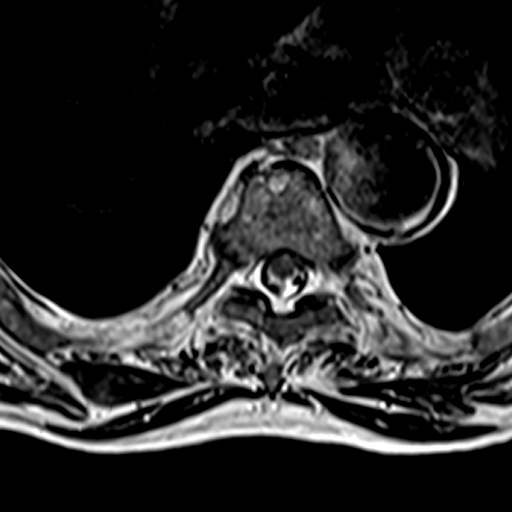
[im 30/33]
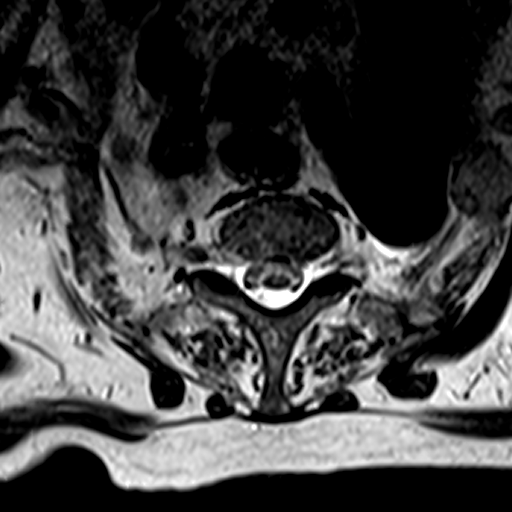

[14 of 48 positions shown; findings below may reference images not displayed]

FINDINGS: Alignment:  Normal

Vertebrae: Chronic compression fractures T5, T7, T11, and T12 appear
unchanged. T7 vertebral body lesion, probable hemangioma. Negative
for acute fracture.

Cord: Normal cord signal and morphology. Negative for cord
compression

Paraspinal and other soft tissues: Small bilateral effusions left
greater than right. Atherosclerotic thoracic aorta. Dilated
esophagus with air-fluid level.

Disc levels:

Mild disc degeneration throughout the thoracic spine. No significant
spinal stenosis or disc protrusion.

Disc and adjacent endplate edema L1-2 with bilateral psoas muscle
fluid collections right greater than left. Findings most consistent
with discitis and osteomyelitis and psoas abscess. See separate CT
lumbar spine report from today
IMPRESSION: Multiple chronic thoracic fractures. No acute thoracic fracture.
Negative for spinal stenosis

Findings highly suspicious for discitis and osteomyelitis and right
psoas abscess at L1-2.

## 2019-08-01 ENCOUNTER — Encounter: Payer: Self-pay | Admitting: Family Medicine
